# Patient Record
Sex: Male | Born: 1935 | Race: White | Hispanic: No | Marital: Married | Smoking: Never smoker
Health system: Southern US, Community
[De-identification: ages and names within clinical notes are randomized; demographics above are authoritative.]

## PROBLEM LIST (undated history)

## (undated) DIAGNOSIS — K603 Anal fistula, unspecified: Secondary | ICD-10-CM

## (undated) DIAGNOSIS — N419 Inflammatory disease of prostate, unspecified: Secondary | ICD-10-CM

## (undated) DIAGNOSIS — R011 Cardiac murmur, unspecified: Secondary | ICD-10-CM

## (undated) DIAGNOSIS — K649 Unspecified hemorrhoids: Secondary | ICD-10-CM

## (undated) DIAGNOSIS — I480 Paroxysmal atrial fibrillation: Secondary | ICD-10-CM

## (undated) DIAGNOSIS — N390 Urinary tract infection, site not specified: Secondary | ICD-10-CM

## (undated) DIAGNOSIS — D649 Anemia, unspecified: Secondary | ICD-10-CM

## (undated) DIAGNOSIS — I2510 Atherosclerotic heart disease of native coronary artery without angina pectoris: Secondary | ICD-10-CM

## (undated) DIAGNOSIS — C9000 Multiple myeloma not having achieved remission: Secondary | ICD-10-CM

## (undated) DIAGNOSIS — I350 Nonrheumatic aortic (valve) stenosis: Secondary | ICD-10-CM

## (undated) DIAGNOSIS — C61 Malignant neoplasm of prostate: Secondary | ICD-10-CM

## (undated) DIAGNOSIS — I2699 Other pulmonary embolism without acute cor pulmonale: Principal | ICD-10-CM

## (undated) HISTORY — PX: ANAL FISTULECTOMY: SHX1139

## (undated) HISTORY — PX: CYSTOSCOPY: SHX5120

## (undated) HISTORY — DX: Atherosclerotic heart disease of native coronary artery without angina pectoris: I25.10

## (undated) HISTORY — DX: Malignant neoplasm of prostate: C61

## (undated) HISTORY — PX: TONSILLECTOMY: SUR1361

## (undated) HISTORY — PX: ANKLE SURGERY: SHX546

## (undated) HISTORY — DX: Other pulmonary embolism without acute cor pulmonale: I26.99

## (undated) HISTORY — DX: Nonrheumatic aortic (valve) stenosis: I35.0

## (undated) HISTORY — PX: CARDIAC CATHETERIZATION: SHX172

## (undated) HISTORY — PX: PROSTATE BIOPSY: SHX241

---

## 2011-02-14 DIAGNOSIS — Z23 Encounter for immunization: Secondary | ICD-10-CM | POA: Diagnosis not present

## 2011-07-28 DIAGNOSIS — R972 Elevated prostate specific antigen [PSA]: Secondary | ICD-10-CM | POA: Diagnosis not present

## 2011-08-18 DIAGNOSIS — R972 Elevated prostate specific antigen [PSA]: Secondary | ICD-10-CM | POA: Diagnosis not present

## 2011-10-10 DIAGNOSIS — R972 Elevated prostate specific antigen [PSA]: Secondary | ICD-10-CM | POA: Diagnosis not present

## 2011-10-10 DIAGNOSIS — D696 Thrombocytopenia, unspecified: Secondary | ICD-10-CM | POA: Diagnosis not present

## 2011-10-10 DIAGNOSIS — E785 Hyperlipidemia, unspecified: Secondary | ICD-10-CM | POA: Diagnosis not present

## 2011-10-10 DIAGNOSIS — R030 Elevated blood-pressure reading, without diagnosis of hypertension: Secondary | ICD-10-CM | POA: Diagnosis not present

## 2011-10-15 DIAGNOSIS — D696 Thrombocytopenia, unspecified: Secondary | ICD-10-CM | POA: Diagnosis not present

## 2011-10-15 DIAGNOSIS — Z23 Encounter for immunization: Secondary | ICD-10-CM | POA: Diagnosis not present

## 2011-10-15 DIAGNOSIS — R030 Elevated blood-pressure reading, without diagnosis of hypertension: Secondary | ICD-10-CM | POA: Diagnosis not present

## 2011-11-04 DIAGNOSIS — R972 Elevated prostate specific antigen [PSA]: Secondary | ICD-10-CM | POA: Diagnosis not present

## 2011-12-01 DIAGNOSIS — L570 Actinic keratosis: Secondary | ICD-10-CM | POA: Diagnosis not present

## 2011-12-01 DIAGNOSIS — D485 Neoplasm of uncertain behavior of skin: Secondary | ICD-10-CM | POA: Diagnosis not present

## 2011-12-04 DIAGNOSIS — K5730 Diverticulosis of large intestine without perforation or abscess without bleeding: Secondary | ICD-10-CM | POA: Diagnosis not present

## 2011-12-04 DIAGNOSIS — K648 Other hemorrhoids: Secondary | ICD-10-CM | POA: Diagnosis not present

## 2011-12-04 DIAGNOSIS — Z1211 Encounter for screening for malignant neoplasm of colon: Secondary | ICD-10-CM | POA: Diagnosis not present

## 2012-02-04 DIAGNOSIS — L570 Actinic keratosis: Secondary | ICD-10-CM | POA: Diagnosis not present

## 2012-05-04 DIAGNOSIS — R030 Elevated blood-pressure reading, without diagnosis of hypertension: Secondary | ICD-10-CM | POA: Diagnosis not present

## 2012-08-04 DIAGNOSIS — L570 Actinic keratosis: Secondary | ICD-10-CM | POA: Diagnosis not present

## 2012-11-04 DIAGNOSIS — R972 Elevated prostate specific antigen [PSA]: Secondary | ICD-10-CM | POA: Diagnosis not present

## 2012-11-09 DIAGNOSIS — Z125 Encounter for screening for malignant neoplasm of prostate: Secondary | ICD-10-CM | POA: Diagnosis not present

## 2012-11-09 DIAGNOSIS — Z0000 Encounter for general adult medical examination without abnormal findings: Secondary | ICD-10-CM | POA: Diagnosis not present

## 2012-11-09 DIAGNOSIS — D696 Thrombocytopenia, unspecified: Secondary | ICD-10-CM | POA: Diagnosis not present

## 2012-11-09 DIAGNOSIS — I4949 Other premature depolarization: Secondary | ICD-10-CM | POA: Diagnosis not present

## 2012-11-09 DIAGNOSIS — R030 Elevated blood-pressure reading, without diagnosis of hypertension: Secondary | ICD-10-CM | POA: Diagnosis not present

## 2012-11-09 DIAGNOSIS — K5730 Diverticulosis of large intestine without perforation or abscess without bleeding: Secondary | ICD-10-CM | POA: Diagnosis not present

## 2012-11-09 DIAGNOSIS — E559 Vitamin D deficiency, unspecified: Secondary | ICD-10-CM | POA: Diagnosis not present

## 2012-11-09 DIAGNOSIS — R972 Elevated prostate specific antigen [PSA]: Secondary | ICD-10-CM | POA: Diagnosis not present

## 2012-11-09 DIAGNOSIS — I517 Cardiomegaly: Secondary | ICD-10-CM | POA: Diagnosis not present

## 2012-11-09 DIAGNOSIS — Z5181 Encounter for therapeutic drug level monitoring: Secondary | ICD-10-CM | POA: Diagnosis not present

## 2012-11-12 DIAGNOSIS — R030 Elevated blood-pressure reading, without diagnosis of hypertension: Secondary | ICD-10-CM | POA: Diagnosis not present

## 2012-11-12 DIAGNOSIS — R972 Elevated prostate specific antigen [PSA]: Secondary | ICD-10-CM | POA: Diagnosis not present

## 2012-11-12 DIAGNOSIS — K5730 Diverticulosis of large intestine without perforation or abscess without bleeding: Secondary | ICD-10-CM | POA: Diagnosis not present

## 2012-11-12 DIAGNOSIS — I4949 Other premature depolarization: Secondary | ICD-10-CM | POA: Diagnosis not present

## 2012-11-12 DIAGNOSIS — Z23 Encounter for immunization: Secondary | ICD-10-CM | POA: Diagnosis not present

## 2012-11-12 DIAGNOSIS — D696 Thrombocytopenia, unspecified: Secondary | ICD-10-CM | POA: Diagnosis not present

## 2012-12-06 DIAGNOSIS — R9431 Abnormal electrocardiogram [ECG] [EKG]: Secondary | ICD-10-CM | POA: Diagnosis not present

## 2012-12-06 DIAGNOSIS — R002 Palpitations: Secondary | ICD-10-CM | POA: Diagnosis not present

## 2012-12-06 DIAGNOSIS — R9430 Abnormal result of cardiovascular function study, unspecified: Secondary | ICD-10-CM | POA: Diagnosis not present

## 2013-02-23 DIAGNOSIS — L570 Actinic keratosis: Secondary | ICD-10-CM | POA: Diagnosis not present

## 2013-05-23 DIAGNOSIS — I6529 Occlusion and stenosis of unspecified carotid artery: Secondary | ICD-10-CM | POA: Diagnosis not present

## 2013-05-23 DIAGNOSIS — R972 Elevated prostate specific antigen [PSA]: Secondary | ICD-10-CM | POA: Diagnosis not present

## 2013-05-23 DIAGNOSIS — R030 Elevated blood-pressure reading, without diagnosis of hypertension: Secondary | ICD-10-CM | POA: Diagnosis not present

## 2013-06-01 DIAGNOSIS — I6529 Occlusion and stenosis of unspecified carotid artery: Secondary | ICD-10-CM | POA: Diagnosis not present

## 2013-10-05 DIAGNOSIS — D239 Other benign neoplasm of skin, unspecified: Secondary | ICD-10-CM | POA: Diagnosis not present

## 2013-10-05 DIAGNOSIS — L821 Other seborrheic keratosis: Secondary | ICD-10-CM | POA: Diagnosis not present

## 2013-11-07 DIAGNOSIS — N400 Enlarged prostate without lower urinary tract symptoms: Secondary | ICD-10-CM | POA: Diagnosis not present

## 2013-11-07 DIAGNOSIS — N410 Acute prostatitis: Secondary | ICD-10-CM | POA: Diagnosis not present

## 2013-11-07 DIAGNOSIS — R972 Elevated prostate specific antigen [PSA]: Secondary | ICD-10-CM | POA: Diagnosis not present

## 2013-12-06 DIAGNOSIS — R972 Elevated prostate specific antigen [PSA]: Secondary | ICD-10-CM | POA: Diagnosis not present

## 2013-12-07 DIAGNOSIS — N400 Enlarged prostate without lower urinary tract symptoms: Secondary | ICD-10-CM | POA: Diagnosis not present

## 2013-12-07 DIAGNOSIS — Z23 Encounter for immunization: Secondary | ICD-10-CM | POA: Diagnosis not present

## 2013-12-07 DIAGNOSIS — N390 Urinary tract infection, site not specified: Secondary | ICD-10-CM | POA: Diagnosis not present

## 2013-12-07 DIAGNOSIS — R030 Elevated blood-pressure reading, without diagnosis of hypertension: Secondary | ICD-10-CM | POA: Diagnosis not present

## 2013-12-07 DIAGNOSIS — R972 Elevated prostate specific antigen [PSA]: Secondary | ICD-10-CM | POA: Diagnosis not present

## 2013-12-07 DIAGNOSIS — R011 Cardiac murmur, unspecified: Secondary | ICD-10-CM | POA: Diagnosis not present

## 2013-12-07 DIAGNOSIS — I499 Cardiac arrhythmia, unspecified: Secondary | ICD-10-CM | POA: Diagnosis not present

## 2013-12-07 DIAGNOSIS — M25562 Pain in left knee: Secondary | ICD-10-CM | POA: Diagnosis not present

## 2014-01-12 DIAGNOSIS — D649 Anemia, unspecified: Secondary | ICD-10-CM | POA: Diagnosis not present

## 2014-01-12 DIAGNOSIS — R700 Elevated erythrocyte sedimentation rate: Secondary | ICD-10-CM | POA: Diagnosis not present

## 2014-01-12 DIAGNOSIS — R030 Elevated blood-pressure reading, without diagnosis of hypertension: Secondary | ICD-10-CM | POA: Diagnosis not present

## 2014-01-26 DIAGNOSIS — R972 Elevated prostate specific antigen [PSA]: Secondary | ICD-10-CM | POA: Diagnosis not present

## 2014-01-26 DIAGNOSIS — C61 Malignant neoplasm of prostate: Secondary | ICD-10-CM | POA: Diagnosis not present

## 2014-02-01 ENCOUNTER — Other Ambulatory Visit: Payer: Self-pay | Admitting: Urology

## 2014-02-01 DIAGNOSIS — C61 Malignant neoplasm of prostate: Secondary | ICD-10-CM

## 2014-02-16 ENCOUNTER — Encounter (HOSPITAL_COMMUNITY)
Admission: RE | Admit: 2014-02-16 | Discharge: 2014-02-16 | Disposition: A | Discharge status: Home / Self Care | Payer: Medicare Other | Source: Ambulatory Visit | Attending: Urology | Admitting: Urology

## 2014-02-16 ENCOUNTER — Encounter: Payer: Self-pay | Admitting: Diagnostic Radiology

## 2014-02-16 DIAGNOSIS — C61 Malignant neoplasm of prostate: Secondary | ICD-10-CM | POA: Diagnosis not present

## 2014-02-16 MED ORDER — TECHNETIUM TC 99M MEDRONATE IV KIT
26.6000 | PACK | Freq: Once | INTRAVENOUS | Status: AC | PRN
  Administered 2014-02-16: 26.6 via INTRAVENOUS

## 2014-02-27 DIAGNOSIS — C61 Malignant neoplasm of prostate: Secondary | ICD-10-CM | POA: Diagnosis not present

## 2014-02-28 ENCOUNTER — Encounter: Payer: Self-pay | Admitting: Medical Oncology

## 2014-02-28 NOTE — [facility] Oncology Navigator Note (Signed)
I called pt to introduce myself as the Prostate Nurse Navigator and the Coordinator of the Prostate [place].  1. I confirmed with the patient he is aware of his referral to the clinic. He states he saw Dr. Diona Fanti yesterday and he briefly discussed the [facility].  I confirmed his appointment for 2/12/ with arrival time at 7:30.   2. I discussed the format of the clinic and the physicians he will be seeing that day and to bring his wife or family members if he desired. He states he will probably bring his wife.   3. I discussed where the clinic is located and how to contact me.  4. I confirmed his address and informed him I would be mailing a packet of information and forms to be completed. I asked him to bring them with him the day of his appointment.   He voiced understanding of the above. I asked him to call me if he has any questions or concerns regarding his appointments or the forms he needs to complete.   Cira Rue, RN, BSN, [place]  [phone] Fax [phone]

## 2014-03-07 ENCOUNTER — Encounter: Payer: Self-pay | Admitting: Radiation Oncology

## 2014-03-07 NOTE — Progress Notes (Signed)
GU Location of Tumor / Histology: prostatic adenocarcinoma   If Prostate Cancer, Gleason Score is (4 + 5) and PSA is (10.32)  Mitchell Lowery presented to Dr. Diona Fanti in October 2015 as a self referral for prostate problems.  Biopsies of prostate (if applicable) revealed:    Past/Anticipated interventions by urology, if any: biopsy and referral to radiation therapy; recommending aggressive treatment   Past/Anticipated interventions by medical oncology, if any: no  Weight changes, if any: no  Bowel/Bladder complaints, if any: sudden onset of fever as well as frequency of urination, denies dysuria, reports a good urine stream, denies gross hematuria, denies significant nocturia    Nausea/Vomiting, if any: no  Pain issues, if any:  no  SAFETY ISSUES:  Prior radiation? no  Pacemaker/ICD? no  Possible current pregnancy? no  Is the patient on methotrexate? no  Current Complaints / other details:  80 year old male. Married. Retired. 41 cc prostate volume. 1 son and 3 daughters. Bone and CT scan negative. Reports excellent sexual function. Dahlstedt discussed surgery vs. adrogen therapy, radiotherapy and a boost.

## 2014-03-10 ENCOUNTER — Ambulatory Visit
Admission: RE | Admit: 2014-03-10 | Discharge: 2014-03-10 | Disposition: A | Discharge status: Home / Self Care | Payer: Medicare Other | Source: Ambulatory Visit | Attending: Radiation Oncology | Admitting: Radiation Oncology

## 2014-03-10 ENCOUNTER — Encounter: Payer: Self-pay | Admitting: *Deleted

## 2014-03-10 ENCOUNTER — Encounter: Payer: Self-pay | Admitting: Medical Oncology

## 2014-03-10 ENCOUNTER — Encounter: Payer: Self-pay | Admitting: Radiation Oncology

## 2014-03-10 ENCOUNTER — Ambulatory Visit (HOSPITAL_BASED_OUTPATIENT_CLINIC_OR_DEPARTMENT_OTHER): Payer: Medicare Other | Admitting: Oncology

## 2014-03-10 VITALS — BP 147/56 | HR 61 | Temp 97.8°F | Resp 20 | Ht 71.0 in | Wt 171.6 lb

## 2014-03-10 DIAGNOSIS — C61 Malignant neoplasm of prostate: Secondary | ICD-10-CM | POA: Diagnosis not present

## 2014-03-10 DIAGNOSIS — Z7982 Long term (current) use of aspirin: Secondary | ICD-10-CM | POA: Insufficient documentation

## 2014-03-10 HISTORY — DX: Anal fistula, unspecified: K60.30

## 2014-03-10 HISTORY — DX: Inflammatory disease of prostate, unspecified: N41.9

## 2014-03-10 HISTORY — DX: Anal fistula: K60.3

## 2014-03-10 NOTE — [facility] Oncology Navigator Note (Addendum)
                               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 Raynelle Bring, [facility]  Radiation Oncologist - Dr. Tyler Pita, [facility]   Medical Oncologist - Dr. Zola Button, [place]  Recommendations: 1) Hormone Therapy  2) Radiation 3) Seed Implant  * These recommendations are based on information available as of today's consult.      Recommendations may change depending on the results of further tests or exams.    Next Steps: 1) Dr. Reinaldo Berber office will call you to set up hormone therapy. 2) Dr. Johny Shears  office will call you with radiation appointments 3) Dr. Johny Shears office will schedule seed implants When appointments need to be scheduled, you will be contacted by [facility] and/or [facility].  Questions?  Please do not hesitate to call Cira Rue, RN, BSN, CRNI at [phone] any questions or concerns.  Shirlean Mylar is your Oncology Nurse Navigator and is available to assist you while you're receiving your medical care at [facility].

## 2014-03-10 NOTE — Progress Notes (Signed)
80 year old male. Retired. Married to [place]. Has four children, Elige Ko, and Webb Silversmith. Reports that he has had a colonoscopy. Denies performing routine testicular exams. Wears glasses.

## 2014-03-10 NOTE — Consult Note (Signed)
Chief Complaint  Prostate Cancer   Reason For Visit  Reason for consult: To discuss treatment options for high risk localized prostate cancer. Location of consult: [facility] Physician requesting consult: Dr. Franchot Gallo PCP: Dr. 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 Diona Fanti in October 2015 after moving to [facility].  He was noted to have a history of recurrent prostatitis and had a history of an elevated PSA.  He had apparently undergone a prostate biopsy 10-20 years ago in [place] that was benign per the patient's report.  His PSA was found to be 9.06 when checked by Dr. Diona Fanti and this was noted to be increased from 5.7 at his last urologist's office the prior year.  His PSA was rechecked and was 10.3.  This prompted a prostate needle biopsy on 01/26/14 that confirmed Gleason 5+4=9 adenocarcinoma with 4 out of 12 biopsy cores positive for malignancy. Staging studies were performed on 02/16/14 including a bone scan and CT scan of the abdomen and pelvis with no concerning findings to suggest metastatic disease.  He is very healthy and has no medical comorbid conditions.  He does have longevity in his family with his mother having lived to age 67.  TNM stage: cT1c N0 M0 PSA: 10.3 Gleason score: 4+5=9 Biopsy (01/26/14): 4/12 cores positive     Left: L lateral apex (90%, 4+5=9), L apex (50%, 4+5=9), L lateral mid (40%, 4+3=7), L mid (20%, 3+4=7) Prostate volume: 41.0 cc  Nomogram OC disease: 22 % EPE: 75% SVI: 17% LNI: 19%  Urinary function: IPSS is 1. Erectile function: SHIM score is 17.   Past Medical History  1. History of No significant past medical history  Surgical History  1. History of Anal Fistulectomy  2. History of Ankle Surgery  Current Meds  1. Aspirin 81 MG Oral Tablet;  Therapy: (Recorded:12Oct2015) to Recorded  2. Saw Palmetto CAPS;   Therapy: (Recorded:12Oct2015) to Recorded  Allergies  1. No Known Drug Allergies  Family History  1. Family history of Death of family member : Mother, Father  2. Family history of malaria (Z83.1) : Father  3. Family history of pneumonia (Z83.1) : Mother  Social History   Alcohol use (Z78.9)   Denied: History of Caffeine use   Married   Never a smoker   Number of children   Retired  Physical Exam Constitutional: Well nourished and well developed . No acute distress.    Results/Data  We have reviewed his medical records, pathology slides, PSA results, and bone scan and CT scan images in the multidisciplinary clinic today.  Findings are as dictated above.     Assessment  1. Adenocarcinoma of prostate (C61)  Discussion/Summary  1.  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  He understands that he would be at high risk for development of metastatic disease and death related to prostate cancer without treatment.  I therefore recommended therapy of curative intent.  He has previously discussed his options with Dr. Alen Blew and Dr. Tammi Klippel this morning.  We specifically focused our discussion on options including primary surgical therapy and primary radiotherapy with concomitant long-term androgen deprivation treatment.  We have reviewed the pros and cons of these  approaches in detail.  He understands that surgical therapy certainly carries a higher risk for incontinence at his advanced age and there certainly is a potential risk for needing radiation or androgen deprivation therapy as adjuvant/salvage treatment regardless considering his high risk disease.  We have reviewed the approach of primary radiation therapy with concomitant androgen deprivation and the potential side effects related  to these therapies and have a comparative surgical treatment.  We have specifically discussed how these treatments may affect his urinary function, erectile function, bowel function, and other aspects of his quality of life.  He has many good questions and has a very excellent understanding of his treatment options.  All of his questions were answered to his stated satisfaction.  After an extensive discussion, [name] informed me that he does wish to proceed with primary radiotherapy in conjunction with androgen deprivation.  Dr. Tammi Klippel will proceed to schedule him for radiation treatment planning.  I will notify Dr. Diona Fanti of his decision so the he can begin androgen deprivation therapy and placement of fiducial markers.  Dr. Tammi Klippel is given strong consideration of external beam radiation therapy with a radiation seed implant boost.    A total of 45 minutes were spent in the overall care of the patient today with 45 minutes in direct face to face consultation.   Cc: Dr. Mertha Finders Dr. Franchot Gallo Dr. Tyler Pita Dr. Zola Button    Signatures Electronically signed by : Raynelle Bring, M.D.; Mar 10 2014 12:21PM EST

## 2014-03-10 NOTE — Progress Notes (Signed)
Radiation Oncology         [phone] ________________________________  Multidisciplinary Prostate Cancer Clinic  Initial Radiation Oncology Consultation  Name: Mitchell Lowery MRN: [id number]  Date: 03/10/2014  DOB: 08-18-1934  CC:No primary care provider on file.  Raynelle Bring, MD   REFERRING PHYSICIAN: Raynelle Bring, MD  DIAGNOSIS: 80 y.o. gentleman with stage T1c adenocarcinoma of the prostate with a Gleason's score of 4+5 and a PSA of 10.4    ICD-9-CM ICD-10-CM   1. Malignant neoplasm of prostate [place] is a 80 y.o. gentleman with history of prostatitis in the past.  He moved to [facility] and transferred his care to Dr. Diona Fanti on 11/07/13.  He was noted to have an elevated PSA at that time at 9.06.  Digital rectal examination was performed at that time revealing a 60 gm gland.  In follow-up, his PSA was higher at 10.32 on 12/06/13.  The patient proceeded to transrectal ultrasound with 12 biopsies of the prostate on 01/26/14.  The prostate volume measured 41.03 cc.  Out of 12 core biopsies, 4 were positive.  The maximum Gleason score was 4+5, and this was seen in the left apex.    The patient reviewed the biopsy results with his urologist and he has kindly been referred today to the multidisciplinary prostate cancer clinic for presentation of pathology and radiology studies in our conference for discussion of potential radiation treatment options and clinical evaluation.  PREVIOUS RADIATION THERAPY: No  PAST MEDICAL HISTORY:  has a past medical history of Prostate cancer.    PAST SURGICAL HISTORY: Past Surgical History  Procedure Laterality Date  . Prostate biopsy    . Prostate biopsy    . Anal fistulectomy    . Ankle surgery      FAMILY HISTORY: family history is not on file.  SOCIAL HISTORY:  reports that he has never smoked. He has never used smokeless tobacco. He reports that he drinks alcohol. He reports  that he does not use illicit drugs.  ALLERGIES: Review of patient's allergies indicates no known allergies.  MEDICATIONS:  Current Outpatient Prescriptions  Medication Sig Dispense Refill  . aspirin 81 MG tablet Take 81 mg by mouth daily.    . saw palmetto 160 MG capsule Take 160 mg by mouth 2 (two) times daily.    Marland Kitchen trimethoprim (TRIMPEX) 100 MG tablet Take 100 mg by mouth 2 (two) times daily.     No current facility-administered medications for this encounter.    REVIEW OF SYSTEMS:  A 15 point review of systems is documented in the electronic medical record. This was obtained by the nursing staff. However, I reviewed this with the patient to discuss relevant findings and make appropriate changes.  A comprehensive review of systems was negative..  The patient completed an IPSS and IIEF questionnaire.  His IPSS score was 1 indicating mild urinary outflow obstructive symptoms.  He indicated that his erectile function is able to complete sexual activity on most attempts.   PHYSICAL EXAM: This patient is in no acute distress.  He is alert and oriented.   He exhibits no respiratory distress or labored breathing.  He appears neurologically intact.  His mood is pleasant.  His affect is appropriate.  Please note the digital rectal exam findings described above.  KPS = 100  100 - Normal; no complaints; no evidence of disease. 90   - Able to carry on normal activity; minor signs  or symptoms of disease. 80   - Normal activity with effort; some signs or symptoms of disease. 68   - Cares for self; unable to carry on normal activity or to do active work. 60   - Requires occasional assistance, but is able to care for most of his personal needs. 50   - Requires considerable assistance and frequent medical care. 63   - Disabled; requires special care and assistance. 8   - Severely disabled; hospital admission is indicated although death not imminent. 56   - Very sick; hospital admission necessary;  active supportive treatment necessary. 10   - Moribund; fatal processes progressing rapidly. 0     - Dead  Karnofsky DA, Abelmann WH, Craver LS and Burchenal JH [phone]) The use of the nitrogen mustards in the palliative treatment of carcinoma: with particular reference to bronchogenic carcinoma Cancer 1 634-56   LABORATORY DATA:  No results found for: WBC, HGB, HCT, MCV, PLT No results found for: NA, K, CL, CO2 No results found for: ALT, AST, GGT, ALKPHOS, BILITOT   RADIOGRAPHY: Nm Bone Scan Whole Body  02/16/2014   CLINICAL DATA:  Prostate cancer.  PSA 10.32  EXAM: NUCLEAR MEDICINE WHOLE BODY BONE SCAN  TECHNIQUE: Whole body anterior and posterior images were obtained approximately 3 hours after intravenous injection of radiopharmaceutical.  RADIOPHARMACEUTICALS:  26.6 MCi Technetium-99 MDP  COMPARISON:  CT abdomen and pelvis 02/16/2014.  FINDINGS: No abnormal uptake to suggest metastatic disease is identified. No activity can suggestive of degenerative disease is identified. Soft tissue uptake is normal.  IMPRESSION: Negative for metastatic disease.   Electronically Signed   By: Inge Rise M.D.   On: 02/16/2014 14:41      IMPRESSION: This gentleman is a 80 y.o. gentleman with stage T1c adenocarcinoma of the prostate with a Gleason's score of 4+5 and a PSA of 10.4.  His T-Stage, Gleason's Score, and PSA put him into the high risk group.  Accordingly he is eligible for a variety of potential treatment options including radical prostatectomy versus external beam radiotherapy with or without seed implant boost in conjunction with 2-3 years of androgen deprivation therapy.  PLAN:Today I reviewed the findings and workup thus far.  We discussed the natural history of prostate cancer.  We reviewed the the implications of T-stage, Gleason's Score, and PSA on decision-making and outcomes in prostate cancer.  We discussed radiation treatment in the management of prostate cancer with regard to the  logistics and delivery of external beam radiation treatment as well as the logistics and delivery of prostate brachytherapy.  We compared and contrasted each of these approaches and also compared these against prostatectomy.  The patient expressed interest in external beam radiotherapy followed by prostate seed implant in conjunction with 2-3 years of androgen deprivation. Given the risk of pelvic lymph node involvement, I would recommend treatment of the pelvic lymph nodes during the initial external radiotherapy..  I filled out a patient counseling form for him with relevant treatment diagrams and we retained a copy for our records.   The patient would like to proceed with prostate, seminal vesicle, and pelvic lymph node IMRT followed by prostate seed implant..  I will share my findings with Dr. Alinda Money and move forward with scheduling a return visit to Dr. Diona Fanti for initiation of androgen deprivation therapy now, and in 6 weeks, placement of three gold fiducial markers into the prostate to proceed with IMRT to be followed by prostate seed implant.     I  enjoyed meeting with him today, and will look forward to participating in the care of this very nice gentleman.   I spent 60 minutes face to face with the patient and more than 50% of that time was spent in counseling and/or coordination of care.   ------------------------------------------------  Sheral Apley. Tammi Klippel, M.D.

## 2014-03-10 NOTE — Consult Note (Signed)
Reason for Referral: Prostate cancer.   HPI: 80 year old gentleman who have been rather healthy the majority of his life. He has lived in multiple locations and was a Event organiser at [employer] and while he was living there he had a biopsy of his prostate that was done revealing. He has had intermittent symptoms of acute prostatitis also had had an elevated PSA previously. He established care with Dr. Diona Fanti after he relocated to [facility] for the last 6 months. His repeat PSA was up to 10.32 and underwent a biopsy on 01/31/2014. The biopsy showed a Gleason score 4+5 = 9 and 2 cores at the apex and 3+4 = 7 in 2 other cores. He is essentially asymptomatic at this time with IPSS score of 0 and no other symptoms. He is a very high functional individual and continues to be active on a regular basis. He does not report any headaches, blurry vision, double vision or syncope. Does not report any chest pain, palpitation or orthopnea. Does not report any cough or hemoptysis or hematemesis. He does not report any nausea, vomiting, abdominal pain. Does not report any frequency urgency or hesitancy. Does not report skeletal complaints. Rest of his review of systems unremarkable.   Past Medical History  Diagnosis Date  . Prostate cancer   :  Past Surgical History  Procedure Laterality Date  . Prostate biopsy    . Prostate biopsy    . Anal fistulectomy    . Ankle surgery    :   Current outpatient prescriptions:  .  aspirin 81 MG tablet, Take 81 mg by mouth daily., Disp: , Rfl:  .  saw palmetto 160 MG capsule, Take 160 mg by mouth 2 (two) times daily., Disp: , Rfl:  .  trimethoprim (TRIMPEX) 100 MG tablet, Take 100 mg by mouth 2 (two) times daily., Disp: , Rfl: :  No Known Allergies:  No family history on file.:  History   Social History  . Marital Status: Unknown    Spouse Name: N/A  . Number of Children: N/A  . Years of Education: N/A   Occupational History  . Not on  file.   Social History Main Topics  . Smoking status: Never Smoker   . Smokeless tobacco: Never Used  . Alcohol Use: Yes  . Drug Use: No  . Sexual Activity: Yes   Other Topics Concern  . Not on file   Social History Narrative  :  Pertinent items are noted in HPI.  Exam: ECOG 0  There were no vitals taken for this visit. General appearance: alert and cooperative Head: Normocephalic, without obvious abnormality Throat: lips, mucosa, and tongue normal; teeth and gums normal Neck: no adenopathy Back: negative Resp: clear to auscultation bilaterally Chest wall: no tenderness Cardio: regular rate and rhythm, S1, S2 normal, no murmur, click, rub or gallop GI: soft, non-tender; bowel sounds normal; no masses,  no organomegaly Extremities: extremities normal, atraumatic, no cyanosis or edema Pulses: 2+ and symmetric Skin: Skin color, texture, turgor normal. No rashes or lesions  Nm Bone Scan Whole Body  02/16/2014   CLINICAL DATA:  Prostate cancer.  PSA 10.32  EXAM: NUCLEAR MEDICINE WHOLE BODY BONE SCAN  TECHNIQUE: Whole body anterior and posterior images were obtained approximately 3 hours after intravenous injection of radiopharmaceutical.  RADIOPHARMACEUTICALS:  26.6 MCi Technetium-99 MDP  COMPARISON:  CT abdomen and pelvis 02/16/2014.  FINDINGS: No abnormal uptake to suggest metastatic disease is identified. No activity can suggestive of degenerative disease is  identified. Soft tissue uptake is normal.  IMPRESSION: Negative for metastatic disease.   Electronically Signed   By: Inge Rise M.D.   On: 02/16/2014 14:41    Assessment and Plan:   80 year old gentleman diagnosed with prostate cancer after presenting with a PSA of 10.32 and a Gleason score 4+5 = 9 in 2 cores and a 3+4 = 7 in 2 other cores. He did have a bone scan on 02/16/2014 that was reviewed today and showed no evidence of metastatic disease. CT scan of the abdomen and pelvis no evidence of any metastasis at that  time. His case was discussed today in the prostate cancer multidisciplinary clinic. His pathology was discussed with the reviewing pathologist. Options of treatments were discussed today which include radical prostatectomy versus radiation therapy with androgen deprivation. Despite his excellent 80 age and performance status we feel that radiation therapy with hormone therapy remains the standard of care in his situation.  I discussed with him the rationale of using androgen deprivation therapy as well as to duration extensively today. Side effects of androgen deprivation was discussed today extensively including muscle loss, weight gain, hyperglycemia, hyperlipidemia, fatigue, tiredness, osteoporosis, cardiovascular, complications. I have discussed with him multitude of trials that compared to duration of androgen deprivation including adjuvant Lupron for 18 months for patients with locally advanced disease that showed the majority of the side effects resolve in time.  Clinical trials including the RTOG 92-02, EORTC 58850, and others that compared different durations showed that 4 months is probably too short of the duration and 36 months is probably unnecessary. A period of 12-18 months might be reasonable for the duration of androgen deprivation in his particular case.  All his questions were answered to his satisfaction and he'll like to hear from Dr. Alinda Money before making his decision regarding ruling out surgery.

## 2014-03-10 NOTE — Progress Notes (Signed)
  [place] Work  Clinical Social Work met with pt and spouse at [facility] to review distress screening protocol, explain role of CSW and resources of Pt and Family Support Team.  The patient scored a 1 on the Psychosocial Distress Thermometer which indicates no distress. Pt reports to have some anxiety about his treatment plan, but overall feels no distress at this time. Clinical Social Worker reviewed how this could change at various points throughout his treatment. CSW encouraged pt and wife to attend Prostate Cancer Support group and explained other options for support. Pt and wife agree to reach out to CSW as needed.   Clinical Social Worker follow up needed: No.  If yes, follow up plan:  Loren Racer, [place]  [facility] Phone: [phone] Fax: [phone]

## 2014-03-10 NOTE — Progress Notes (Signed)
Please see consult note.  

## 2014-03-14 ENCOUNTER — Telehealth: Payer: Self-pay | Admitting: *Deleted

## 2014-03-14 NOTE — Telephone Encounter (Signed)
CALLED PATIENT TO INFORM THAT HE CAN GO FOR HIS HORMONE SHOT, TODAY, TOMORROW OF Thursday 3 PM OR 3:30 PM AND HIS GOLD SEED PLACEMENT ON 05-02-14 - ARRIVAL TIME @ THE UROLOGIST'S OFFICE AND HIS SIM ON 05-05-14 @ 1 PM @ DR. MANNING'S OFFICE, LVM FOR A RETURN CALL

## 2014-03-16 ENCOUNTER — Telehealth: Payer: Self-pay | Admitting: *Deleted

## 2014-03-16 DIAGNOSIS — C61 Malignant neoplasm of prostate: Secondary | ICD-10-CM | POA: Diagnosis not present

## 2014-03-16 DIAGNOSIS — Z5111 Encounter for antineoplastic chemotherapy: Secondary | ICD-10-CM | POA: Diagnosis not present

## 2014-03-16 NOTE — Telephone Encounter (Signed)
Called patient to inform of sim appt. Being moved from 05-05-14 to 05-12-14 @ 1 pm, (this appt. Was moved due to seed appt. Being moved to 05-05-14), spoke with patient and he is aware of this appt. Change and he is good with this appt. Change.

## 2014-03-31 DIAGNOSIS — H2513 Age-related nuclear cataract, bilateral: Secondary | ICD-10-CM | POA: Diagnosis not present

## 2014-04-19 DIAGNOSIS — C61 Malignant neoplasm of prostate: Secondary | ICD-10-CM | POA: Diagnosis not present

## 2014-04-24 DIAGNOSIS — D696 Thrombocytopenia, unspecified: Secondary | ICD-10-CM | POA: Diagnosis not present

## 2014-04-24 DIAGNOSIS — R011 Cardiac murmur, unspecified: Secondary | ICD-10-CM | POA: Diagnosis not present

## 2014-04-24 DIAGNOSIS — R700 Elevated erythrocyte sedimentation rate: Secondary | ICD-10-CM | POA: Diagnosis not present

## 2014-04-24 DIAGNOSIS — N400 Enlarged prostate without lower urinary tract symptoms: Secondary | ICD-10-CM | POA: Diagnosis not present

## 2014-04-24 DIAGNOSIS — D649 Anemia, unspecified: Secondary | ICD-10-CM | POA: Diagnosis not present

## 2014-04-24 DIAGNOSIS — Z0001 Encounter for general adult medical examination with abnormal findings: Secondary | ICD-10-CM | POA: Diagnosis not present

## 2014-04-24 DIAGNOSIS — Z1389 Encounter for screening for other disorder: Secondary | ICD-10-CM | POA: Diagnosis not present

## 2014-04-24 DIAGNOSIS — K5790 Diverticulosis of intestine, part unspecified, without perforation or abscess without bleeding: Secondary | ICD-10-CM | POA: Diagnosis not present

## 2014-04-24 DIAGNOSIS — E780 Pure hypercholesterolemia: Secondary | ICD-10-CM | POA: Diagnosis not present

## 2014-04-24 DIAGNOSIS — R030 Elevated blood-pressure reading, without diagnosis of hypertension: Secondary | ICD-10-CM | POA: Diagnosis not present

## 2014-04-24 DIAGNOSIS — C61 Malignant neoplasm of prostate: Secondary | ICD-10-CM | POA: Diagnosis not present

## 2014-04-25 DIAGNOSIS — L570 Actinic keratosis: Secondary | ICD-10-CM | POA: Diagnosis not present

## 2014-05-02 ENCOUNTER — Telehealth: Payer: Self-pay | Admitting: Medical Oncology

## 2014-05-02 NOTE — Telephone Encounter (Signed)
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 He is scheduled for simulation with Dr. Tammi Klippel 05/12/2014. I returned his call and had to leave a message. I explained that the day of simulation he will be provided a calender of appointments for his radiation.They will discuss with him the best time of day to come before making his schedule.I It usually begins 7-10 post simulation. I asked him to call me if he needs to discuss further.    Cira Rue, RN, BSN, [place]  [phone] Fax [phone]

## 2014-05-05 ENCOUNTER — Other Ambulatory Visit: Payer: Self-pay | Admitting: Radiation Oncology

## 2014-05-05 DIAGNOSIS — C61 Malignant neoplasm of prostate: Secondary | ICD-10-CM | POA: Diagnosis not present

## 2014-05-12 ENCOUNTER — Ambulatory Visit
Admission: RE | Admit: 2014-05-12 | Discharge: 2014-05-12 | Disposition: A | Discharge status: Home / Self Care | Payer: Medicare Other | Source: Ambulatory Visit | Attending: Radiation Oncology | Admitting: Radiation Oncology

## 2014-05-12 DIAGNOSIS — C61 Malignant neoplasm of prostate: Secondary | ICD-10-CM | POA: Diagnosis not present

## 2014-05-12 NOTE — Progress Notes (Signed)
  Radiation Oncology         [phone] ________________________________  Name: WINNIE BARSKY MRN: [id number]  Date: 05/12/2014  DOB: [birth year]  SIMULATION AND TREATMENT PLANNING NOTE    ICD-9-CM ICD-10-CM   1. Malignant neoplasm of prostate 185 C61     DIAGNOSIS:  80 y.o. gentleman with stage T1c adenocarcinoma of the prostate with a Gleason's score of 4+5 and a PSA of 10.4  NARRATIVE:  The patient was brought to the [facility].  Identity was confirmed.  All relevant records and images related to the planned course of therapy were reviewed.  The patient freely provided informed written consent to proceed with treatment after reviewing the details related to the planned course of therapy. The consent form was witnessed and verified by the simulation staff.  Then, the patient was set-up in a stable reproducible supine position for radiation therapy.  A vacuum lock pillow device was custom fabricated to position his legs in a reproducible immobilized position.  Then, I performed a urethrogram under sterile conditions to identify the prostatic apex.  CT images were obtained.  Surface markings were placed.  The CT images were loaded into the planning software.  Then the prostate target and avoidance structures including the rectum, bladder, bowel and hips were contoured.  Treatment planning then occurred.  The radiation prescription was entered and confirmed.  A total of 5 complex treatment devices were fabricated, with 1 body fix custom pillow for positioning and for MLC to shape radiation. I have requested : 3D Simulation  I have requested a DVH of the following structures: rectum, bladder, left hip, right hip, and prostate.  PLAN:  The patient will receive 45 Gy in 25 fractions. Followed by a prostate seed implant for boost.  This document serves as a record of services personally performed by Tyler Pita, MD. It was created on his behalf by Darcus Austin, a trained medical  scribe. The creation of this record is based on the scribe's personal observations and the provider's statements to them. This document has been checked and approved by the attending provider.       ________________________________  Sheral Apley. Tammi Klippel, M.D.

## 2014-05-15 ENCOUNTER — Encounter: Payer: Self-pay | Admitting: Radiation Oncology

## 2014-05-15 DIAGNOSIS — C61 Malignant neoplasm of prostate: Secondary | ICD-10-CM | POA: Diagnosis not present

## 2014-05-15 NOTE — Progress Notes (Signed)
  Radiation Oncology         [phone] ________________________________  Name: Mitchell Lowery MRN: [id number]  Date: 05/15/2014  DOB: Jan 17, 1935  SIMULATION AND TREATMENT PLANNING NOTE PUBIC ARCH STUDY  CC:No primary care provider on file.  No ref. provider found  DIAGNOSIS: 80 y.o. gentleman with stage T1c adenocarcinoma of the prostate with a Gleason's score of 4+5 and a PSA of 10.4   COMPLEX SIMULATION:  The patient underwent evaluation today for possible prostate seed implant. His external irradiation 3-dimensional image study set was obtained in the planning computer. There, on each axial slice, I contoured the prostate gland. Then, using three-dimensional radiation planning tools I reconstructed the prostate in view of the structures from the transperineal needle pathway to assess for possible pubic arch interference. In doing so, I did not appreciate any pubic arch interference. Also, the patient's prostate volume was estimated based on the drawn structure. The volume was 33 cc correlating well with pre-hormone [place] volume of 41 cc.  Given the pubic arch appearance and prostate volume, patient remains a good candidate to proceed with prostate seed implant as a boost after external irradiation.    PLAN: The patient will undergo prostate seed implant boost after IMRT to his prostate and pelvic nodes.   ________________________________  Sheral Apley Tammi Klippel, M.D.

## 2014-05-16 DIAGNOSIS — C61 Malignant neoplasm of prostate: Secondary | ICD-10-CM | POA: Diagnosis not present

## 2014-05-17 ENCOUNTER — Ambulatory Visit
Admission: RE | Admit: 2014-05-17 | Discharge: 2014-05-17 | Disposition: A | Discharge status: Home / Self Care | Payer: Medicare Other | Source: Ambulatory Visit | Attending: Radiation Oncology | Admitting: Radiation Oncology

## 2014-05-17 DIAGNOSIS — C61 Malignant neoplasm of prostate: Secondary | ICD-10-CM | POA: Diagnosis not present

## 2014-05-18 ENCOUNTER — Ambulatory Visit: Admission: RE | Admit: 2014-05-18 | Payer: Medicare Other | Source: Ambulatory Visit

## 2014-05-19 ENCOUNTER — Ambulatory Visit
Admission: RE | Admit: 2014-05-19 | Discharge: 2014-05-19 | Disposition: A | Discharge status: Home / Self Care | Payer: Medicare Other | Source: Ambulatory Visit | Attending: Radiation Oncology | Admitting: Radiation Oncology

## 2014-05-19 VITALS — BP 139/61 | HR 59 | Temp 98.1°F | Wt 172.0 lb

## 2014-05-19 DIAGNOSIS — C61 Malignant neoplasm of prostate: Secondary | ICD-10-CM

## 2014-05-19 NOTE — Progress Notes (Signed)
Weekly assessment of radiation to pelvis for prostate cncer.Completed 2 treatments thus far.Denies pain or any other problems.Reviewed general side effects of treatment.Inforced need to have full bladder for treatment.Given Radiation Therapy and You Booklet to review.Aldona Bar will go into further detailed teaching next week.

## 2014-05-19 NOTE — Progress Notes (Signed)
  Radiation Oncology         [phone] ________________________________  Name: Mitchell Lowery MRN: [id number]  Date: 05/19/2014  DOB: 1934/06/20    Weekly Radiation Therapy Management    ICD-9-CM ICD-10-CM   1. Malignant neoplasm of prostate 185 C61     Current Dose: 3.6 Gy     Planned Dose:  45 Gy + seed implant  Narrative . . . . . . . . The patient presents for routine under treatment assessment.                                 Weekly assessment of radiation to pelvis for prostate cancer.Completed 2 treatments thus far.Denies pain or any other problems.Reviewed general side effects of treatment.  Reenforced need to have full bladder for treatment.Given Radiation Therapy and You Booklet to review.Aldona Bar will go into further detailed teaching next week.                                 Set-up films were reviewed.                                 The chart was checked. Physical Findings. . .  weight is 172 lb (78.019 kg). His temperature is 98.1 F (36.7 C). His blood pressure is 139/61 and his pulse is 59. . Weight essentially stable.  No significant changes. Impression . . . . . . . The patient is tolerating radiation.  Plan . . . . . . . . . . . . Continue treatment as planned.   This document serves as a record of services personally performed by Tyler Pita, MD. It was created on his behalf by Arlyce Harman, a trained medical scribe. The creation of this record is based on the scribe's personal observations and the provider's statements to them. This document has been checked and approved by the attending provider.     ________________________________  Sheral Apley. Tammi Klippel, M.D.

## 2014-05-22 ENCOUNTER — Ambulatory Visit: Payer: Medicare Other | Admitting: Radiation Oncology

## 2014-05-22 ENCOUNTER — Encounter: Payer: Self-pay | Admitting: Medical Oncology

## 2014-05-22 ENCOUNTER — Ambulatory Visit
Admission: RE | Admit: 2014-05-22 | Discharge: 2014-05-22 | Disposition: A | Discharge status: Home / Self Care | Payer: Medicare Other | Source: Ambulatory Visit | Attending: Radiation Oncology | Admitting: Radiation Oncology

## 2014-05-22 DIAGNOSIS — D649 Anemia, unspecified: Secondary | ICD-10-CM | POA: Diagnosis not present

## 2014-05-22 DIAGNOSIS — C61 Malignant neoplasm of prostate: Secondary | ICD-10-CM | POA: Diagnosis not present

## 2014-05-22 DIAGNOSIS — D72819 Decreased white blood cell count, unspecified: Secondary | ICD-10-CM | POA: Diagnosis not present

## 2014-05-22 NOTE — [facility] Oncology Navigator Note (Signed)
I spoke with Mr. Stroschein this am before his radiation treatment. He states his treatments are going well and he was without complaints. I asked him to call me if any questions or concerns. He voiced understanding.    Cira Rue, RN, BSN, [place]  [phone] Fax [phone]

## 2014-05-23 ENCOUNTER — Ambulatory Visit
Admission: RE | Admit: 2014-05-23 | Discharge: 2014-05-23 | Disposition: A | Discharge status: Home / Self Care | Payer: Medicare Other | Source: Ambulatory Visit | Attending: Radiation Oncology | Admitting: Radiation Oncology

## 2014-05-23 ENCOUNTER — Ambulatory Visit: Payer: Medicare Other

## 2014-05-23 DIAGNOSIS — C61 Malignant neoplasm of prostate: Secondary | ICD-10-CM | POA: Diagnosis not present

## 2014-05-24 ENCOUNTER — Other Ambulatory Visit: Payer: Self-pay | Admitting: Urology

## 2014-05-24 ENCOUNTER — Ambulatory Visit
Admission: RE | Admit: 2014-05-24 | Discharge: 2014-05-24 | Disposition: A | Discharge status: Home / Self Care | Payer: Medicare Other | Source: Ambulatory Visit | Attending: Radiation Oncology | Admitting: Radiation Oncology

## 2014-05-24 ENCOUNTER — Ambulatory Visit: Payer: Medicare Other

## 2014-05-24 DIAGNOSIS — C61 Malignant neoplasm of prostate: Secondary | ICD-10-CM | POA: Diagnosis not present

## 2014-05-25 ENCOUNTER — Ambulatory Visit: Payer: Medicare Other

## 2014-05-25 ENCOUNTER — Ambulatory Visit
Admission: RE | Admit: 2014-05-25 | Discharge: 2014-05-25 | Disposition: A | Discharge status: Home / Self Care | Payer: Medicare Other | Source: Ambulatory Visit | Attending: Radiation Oncology | Admitting: Radiation Oncology

## 2014-05-25 DIAGNOSIS — C61 Malignant neoplasm of prostate: Secondary | ICD-10-CM | POA: Diagnosis not present

## 2014-05-26 ENCOUNTER — Ambulatory Visit: Payer: Medicare Other

## 2014-05-26 ENCOUNTER — Ambulatory Visit
Admission: RE | Admit: 2014-05-26 | Discharge: 2014-05-26 | Disposition: A | Discharge status: Home / Self Care | Payer: Medicare Other | Source: Ambulatory Visit | Attending: Radiation Oncology | Admitting: Radiation Oncology

## 2014-05-26 ENCOUNTER — Encounter: Payer: Self-pay | Admitting: Radiation Oncology

## 2014-05-26 VITALS — BP 135/59 | HR 60 | Resp 16 | Wt 170.4 lb

## 2014-05-26 DIAGNOSIS — C61 Malignant neoplasm of prostate: Secondary | ICD-10-CM

## 2014-05-26 NOTE — Progress Notes (Signed)
  Radiation Oncology         [phone] ________________________________  Name: Mitchell Lowery MRN: [id number]  Date: 05/26/2014  DOB: [birth year]    Weekly Radiation Therapy Management    ICD-9-CM ICD-10-CM   1. Malignant neoplasm of prostate 185 C61     Current Dose: 12.6 Gy     Planned Dose:  45 Gy + seed implant  Narrative . . . . . . . . The patient presents for routine under treatment assessment.                                 Weekly assessment of radiation to pelvis for prostate cancer. Completed 7 treatments thus far. Pt reports that his bowel movements are looser by a margin (not diarrhea), but is not uncomfortable. Also reports that his urine output is normal during the day, but has difficulty urinating at night. Due to the pt having trips in the future, the dates would conflict with his seed implant.                                 Set-up films were reviewed.                                 The chart was checked.  Physical Findings. . .  weight is 170 lb 6.4 oz (77.293 kg). His blood pressure is 135/59 and his pulse is 60. His respiration is 16. . Weight essentially stable.  No significant changes. Impression . . . . . . . The patient is tolerating radiation. Advised the pt to drink more water to keep the bladder full. Plan . . . . . . . . . . . . Continue treatment as planned. Due to the pt's schedule, a good time to schedule the seed implant would be mid-late June.  This document serves as a record of services personally performed by Tyler Pita, MD. It was created on his behalf by Darcus Austin, a trained medical scribe. The creation of this record is based on the scribe's personal observations and the provider's statements to them. This document has been checked and approved by the attending provider.     ________________________________  Sheral Apley. Tammi Klippel, M.D.

## 2014-05-26 NOTE — Progress Notes (Addendum)
Weight and vitals stable. Denies pain. Reports nocturia x 1. Denies dysuria or hematuria. Reports a strong steady urine stream during the day but, does report at night his stream is much weaker. Denies urgency. Denies incontinence or leakage. Denies fatigue. Denies diarrhea.

## 2014-05-29 ENCOUNTER — Ambulatory Visit
Admission: RE | Admit: 2014-05-29 | Discharge: 2014-05-29 | Disposition: A | Discharge status: Home / Self Care | Payer: Medicare Other | Source: Ambulatory Visit | Attending: Radiation Oncology | Admitting: Radiation Oncology

## 2014-05-29 ENCOUNTER — Ambulatory Visit: Payer: Medicare Other

## 2014-05-29 DIAGNOSIS — C61 Malignant neoplasm of prostate: Secondary | ICD-10-CM | POA: Diagnosis not present

## 2014-05-30 ENCOUNTER — Encounter: Payer: Self-pay | Admitting: Medical Oncology

## 2014-05-30 ENCOUNTER — Ambulatory Visit: Payer: Medicare Other

## 2014-05-30 ENCOUNTER — Ambulatory Visit
Admission: RE | Admit: 2014-05-30 | Discharge: 2014-05-30 | Disposition: A | Discharge status: Home / Self Care | Payer: Medicare Other | Source: Ambulatory Visit | Attending: Radiation Oncology | Admitting: Radiation Oncology

## 2014-05-30 DIAGNOSIS — C61 Malignant neoplasm of prostate: Secondary | ICD-10-CM | POA: Diagnosis not present

## 2014-05-30 DIAGNOSIS — Z029 Encounter for administrative examinations, unspecified: Secondary | ICD-10-CM | POA: Diagnosis not present

## 2014-05-30 NOTE — [facility] Oncology Navigator Note (Signed)
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 He states he has been doing well but had a low grade fever yesterday. He is not sure why he had a fever. Today he is feeling ok and reports no symptoms. I asked him to call Dr. Johny Shears nurse if his fever returns or he can call me. He voiced understanding.    Cira Rue, RN, BSN, CRNI Prostate Oncology Navigator [facility]  [phone]  Fax [phone]    .

## 2014-05-31 ENCOUNTER — Ambulatory Visit: Payer: Medicare Other

## 2014-05-31 ENCOUNTER — Ambulatory Visit
Admission: RE | Admit: 2014-05-31 | Discharge: 2014-05-31 | Disposition: A | Discharge status: Home / Self Care | Payer: Medicare Other | Source: Ambulatory Visit | Attending: Radiation Oncology | Admitting: Radiation Oncology

## 2014-05-31 DIAGNOSIS — C61 Malignant neoplasm of prostate: Secondary | ICD-10-CM | POA: Diagnosis not present

## 2014-06-01 ENCOUNTER — Ambulatory Visit: Payer: Medicare Other

## 2014-06-01 ENCOUNTER — Ambulatory Visit
Admission: RE | Admit: 2014-06-01 | Discharge: 2014-06-01 | Disposition: A | Discharge status: Home / Self Care | Payer: Medicare Other | Source: Ambulatory Visit | Attending: Radiation Oncology | Admitting: Radiation Oncology

## 2014-06-01 DIAGNOSIS — C61 Malignant neoplasm of prostate: Secondary | ICD-10-CM | POA: Diagnosis not present

## 2014-06-02 ENCOUNTER — Ambulatory Visit
Admission: RE | Admit: 2014-06-02 | Discharge: 2014-06-02 | Disposition: A | Discharge status: Home / Self Care | Payer: Medicare Other | Source: Ambulatory Visit | Attending: Radiation Oncology | Admitting: Radiation Oncology

## 2014-06-02 ENCOUNTER — Encounter: Payer: Self-pay | Admitting: Radiation Oncology

## 2014-06-02 ENCOUNTER — Ambulatory Visit: Payer: Medicare Other

## 2014-06-02 VITALS — BP 143/61 | HR 61 | Resp 18 | Wt 168.4 lb

## 2014-06-02 DIAGNOSIS — C61 Malignant neoplasm of prostate: Secondary | ICD-10-CM | POA: Diagnosis not present

## 2014-06-02 MED ORDER — TAMSULOSIN HCL 0.4 MG PO CAPS: 0.4000 mg | ORAL_CAPSULE | Freq: Every day | ORAL | Status: DC

## 2014-06-02 NOTE — Progress Notes (Signed)
  Radiation Oncology         [phone] ________________________________  Name: Mitchell Lowery MRN: [id number]  Date: 06/02/2014  DOB: 10/25/1934  Weekly Radiation Therapy Management    ICD-9-CM ICD-10-CM   1. Malignant neoplasm of prostate 185 C61 ciprofloxacin (CIPRO) 500 MG tablet     tamsulosin (FLOMAX) 0.4 MG CAPS capsule    Current Dose: 23.4 Gy     Planned Dose:  45 Gy .seed implant boost  Narrative . . . . . . . . The patient presents for routine under treatment assessment. Weight and vitals stable. Denies pain. Reports nocturia x6. Denies dysuria or hematuria. Denies incontinence or leakage. Reports difficulty emptying his bladder. Reports Monday morning he awoke with a temperature of 102. Reports he began taking Cipro 500 mg bid once given to him by his urologist for prostatitis. Reports since beginning Cipro he has been afebrile.  Set-up films were reviewed. The chart was checked.  Physical Findings. . .  weight is 168 lb 6.4 oz (76.386 kg). His blood pressure is 143/61 and his pulse is 61. His respiration is 18 and oxygen saturation is 100%. . Weight essentially stable.  No significant changes.  Impression . . . . . . . The patient is tolerating radiation.  Plan . . . . . . . . . . . . Continue treatment as planned. Ordered Flomax, instructed to sit on the side of the bed before getting up if feeling lightheaded. Plan of Metamucil.  This document serves as a record of services personally performed by Tyler Pita, MD. It was created on his behalf by Jeralene Peters, a trained medical scribe. The creation of this record is based on the scribe's personal observations and the provider's statements to them. This document has been checked and approved by the attending provider.     ________________________________  Sheral Apley. Tammi Klippel, M.D.

## 2014-06-02 NOTE — Progress Notes (Signed)
Weight and vitals stable. Denies pain. Reports nocturia x6. Denies dysuria or hematuria. Denies incontinence or leakage. Reports difficulty emptying his bladder. Reports Monday morning he awoke with a temperature of 102. Reports he began taking Cipro 500 mg bid once given to him by his urologist for prostatitis. Reports since beginning Cipro he has been afebrile.

## 2014-06-05 ENCOUNTER — Ambulatory Visit
Admission: RE | Admit: 2014-06-05 | Discharge: 2014-06-05 | Disposition: A | Discharge status: Home / Self Care | Payer: Medicare Other | Source: Ambulatory Visit | Attending: Radiation Oncology | Admitting: Radiation Oncology

## 2014-06-05 ENCOUNTER — Ambulatory Visit: Payer: Medicare Other

## 2014-06-05 DIAGNOSIS — C61 Malignant neoplasm of prostate: Secondary | ICD-10-CM | POA: Diagnosis not present

## 2014-06-06 ENCOUNTER — Ambulatory Visit: Payer: Medicare Other

## 2014-06-06 ENCOUNTER — Ambulatory Visit
Admission: RE | Admit: 2014-06-06 | Discharge: 2014-06-06 | Disposition: A | Discharge status: Home / Self Care | Payer: Medicare Other | Source: Ambulatory Visit | Attending: Radiation Oncology | Admitting: Radiation Oncology

## 2014-06-06 DIAGNOSIS — C61 Malignant neoplasm of prostate: Secondary | ICD-10-CM | POA: Diagnosis not present

## 2014-06-07 ENCOUNTER — Ambulatory Visit: Payer: Medicare Other

## 2014-06-07 ENCOUNTER — Ambulatory Visit
Admission: RE | Admit: 2014-06-07 | Discharge: 2014-06-07 | Disposition: A | Discharge status: Home / Self Care | Payer: Medicare Other | Source: Ambulatory Visit | Attending: Radiation Oncology | Admitting: Radiation Oncology

## 2014-06-07 DIAGNOSIS — C61 Malignant neoplasm of prostate: Secondary | ICD-10-CM | POA: Diagnosis not present

## 2014-06-08 ENCOUNTER — Ambulatory Visit: Payer: Medicare Other

## 2014-06-08 ENCOUNTER — Ambulatory Visit
Admission: RE | Admit: 2014-06-08 | Discharge: 2014-06-08 | Disposition: A | Discharge status: Home / Self Care | Payer: Medicare Other | Source: Ambulatory Visit | Attending: Radiation Oncology | Admitting: Radiation Oncology

## 2014-06-08 DIAGNOSIS — C61 Malignant neoplasm of prostate: Secondary | ICD-10-CM | POA: Diagnosis not present

## 2014-06-09 ENCOUNTER — Encounter: Payer: Self-pay | Admitting: Radiation Oncology

## 2014-06-09 ENCOUNTER — Ambulatory Visit
Admission: RE | Admit: 2014-06-09 | Discharge: 2014-06-09 | Disposition: A | Discharge status: Home / Self Care | Payer: Medicare Other | Source: Ambulatory Visit | Attending: Radiation Oncology | Admitting: Radiation Oncology

## 2014-06-09 ENCOUNTER — Encounter (HOSPITAL_BASED_OUTPATIENT_CLINIC_OR_DEPARTMENT_OTHER)
Admission: RE | Admit: 2014-06-09 | Discharge: 2014-06-09 | Disposition: A | Discharge status: Home / Self Care | Payer: Medicare Other | Source: Ambulatory Visit | Attending: Urology | Admitting: Urology

## 2014-06-09 ENCOUNTER — Ambulatory Visit: Payer: Medicare Other

## 2014-06-09 ENCOUNTER — Ambulatory Visit (HOSPITAL_COMMUNITY)
Admission: RE | Admit: 2014-06-09 | Discharge: 2014-06-09 | Disposition: A | Discharge status: Home / Self Care | Payer: Medicare Other | Source: Ambulatory Visit | Attending: Urology | Admitting: Urology

## 2014-06-09 ENCOUNTER — Ambulatory Visit (HOSPITAL_BASED_OUTPATIENT_CLINIC_OR_DEPARTMENT_OTHER)
Admission: RE | Admit: 2014-06-09 | Discharge: 2014-06-09 | Disposition: A | Discharge status: Home / Self Care | Payer: Medicare Other | Source: Ambulatory Visit | Attending: Radiation Oncology | Admitting: Radiation Oncology

## 2014-06-09 VITALS — BP 133/53 | HR 64 | Resp 16 | Wt 169.7 lb

## 2014-06-09 DIAGNOSIS — C61 Malignant neoplasm of prostate: Secondary | ICD-10-CM | POA: Diagnosis not present

## 2014-06-09 DIAGNOSIS — R9431 Abnormal electrocardiogram [ECG] [EKG]: Secondary | ICD-10-CM | POA: Insufficient documentation

## 2014-06-09 DIAGNOSIS — Z01818 Encounter for other preprocedural examination: Secondary | ICD-10-CM | POA: Insufficient documentation

## 2014-06-09 NOTE — Progress Notes (Signed)
   Department of Radiation Oncology  Phone:  [phone] Fax:        [phone]  Weekly Treatment Note    Name: Mitchell Lowery Date: 06/09/2014 MRN: [id number] DOB: [birth year]   Current dose: 30.6 Gy  Current fraction:17   MEDICATIONS: Current Outpatient Prescriptions  Medication Sig Dispense Refill  . aspirin 81 MG tablet Take 81 mg by mouth daily.    . ciprofloxacin (CIPRO) 500 MG tablet Take 500 mg by mouth 2 (two) times daily.    . tamsulosin (FLOMAX) 0.4 MG CAPS capsule Take 1 capsule (0.4 mg total) by mouth daily after supper. 30 capsule 5   No current facility-administered medications for this encounter.     ALLERGIES: Review of patient's allergies indicates no known allergies.   LABORATORY DATA:  No results found for: WBC, HGB, HCT, MCV, PLT No results found for: NA, K, CL, CO2 No results found for: ALT, AST, GGT, ALKPHOS, BILITOT   NARRATIVE: KARLA VINES was seen today for weekly treatment management. The chart was checked and the patient's films were reviewed.  Weight and vitals stable. Denies pain. Reports nocturia x2. Confirms nocturia is less with the aid of Flomax. Denies dysuria or hematuria. Takes Imodium to manage diarrhea. Denies incontinence or leakage. Reports difficulty emptying his bladder has improved with Cipro. Patient reports he will be out of Cipro on Monday and is requesting a new script "just in case his fever returns." Patient questions need for EKG and chest xray prior to seed implantation. Explained these test are done on all patients to ensure clearance for surgery. Patient plans to challenge physician reference the need for either test. Advised to discuss with Dr. Tammi Klippel [facility] concerning refilling Cipro and to let staff know if he experiences a fever during his treatment. Has antibiotics through Monday on his current course.   PHYSICAL EXAMINATION: weight is 169 lb 11.2 oz (76.975 kg). His blood pressure is 133/53 and his pulse  is 64. His respiration is 16.        ASSESSMENT: The patient is doing satisfactorily with treatment.  PLAN: We will continue with the patient's radiation treatment as planned.        This document serves as a record of services personally performed by Kyung Rudd, MD. It was created on his behalf by Derek Mound, a trained medical scribe. The creation of this record is based on the scribe's personal observations and the provider's statements to them. This document has been checked and approved by the attending provider.

## 2014-06-09 NOTE — Progress Notes (Addendum)
Weight and vitals stable. Denis pain. Reports nocturia x2. Confirms nocturia is less with the aid of Flomax. Denies dysuria or hematuria. Takes Imodium to manage diarrhea. Denies incontinence or leakage. Reports difficulty emptying his bladder has improved with Cipro. Patient reports he will be out of Cipro on Monday and is requesting a new script "just in case his fever returns." Patient questions need for EKG and chest xray prior to seed implantation. Explained these test are done on all patients to ensure clearance for surgery. Patient plans to challenge physician reference the need for either test.

## 2014-06-12 ENCOUNTER — Ambulatory Visit
Admission: RE | Admit: 2014-06-12 | Discharge: 2014-06-12 | Disposition: A | Discharge status: Home / Self Care | Payer: Medicare Other | Source: Ambulatory Visit | Attending: Radiation Oncology | Admitting: Radiation Oncology

## 2014-06-12 ENCOUNTER — Ambulatory Visit: Payer: Medicare Other

## 2014-06-12 DIAGNOSIS — C61 Malignant neoplasm of prostate: Secondary | ICD-10-CM | POA: Diagnosis not present

## 2014-06-13 ENCOUNTER — Ambulatory Visit: Payer: Medicare Other

## 2014-06-13 ENCOUNTER — Ambulatory Visit
Admission: RE | Admit: 2014-06-13 | Discharge: 2014-06-13 | Disposition: A | Discharge status: Home / Self Care | Payer: Medicare Other | Source: Ambulatory Visit | Attending: Radiation Oncology | Admitting: Radiation Oncology

## 2014-06-13 DIAGNOSIS — C61 Malignant neoplasm of prostate: Secondary | ICD-10-CM | POA: Diagnosis not present

## 2014-06-14 ENCOUNTER — Ambulatory Visit: Payer: Medicare Other

## 2014-06-14 ENCOUNTER — Ambulatory Visit
Admission: RE | Admit: 2014-06-14 | Discharge: 2014-06-14 | Disposition: A | Discharge status: Home / Self Care | Payer: Medicare Other | Source: Ambulatory Visit | Attending: Radiation Oncology | Admitting: Radiation Oncology

## 2014-06-14 ENCOUNTER — Encounter: Payer: Self-pay | Admitting: Medical Oncology

## 2014-06-14 DIAGNOSIS — C61 Malignant neoplasm of prostate: Secondary | ICD-10-CM | POA: Diagnosis not present

## 2014-06-14 NOTE — Progress Notes (Signed)
.   Oncology Nurse Navigator Documentation  Oncology Nurse Navigator Flowsheets 06/14/2014  Navigator Encounter Type Treatment  Patient Visit Type Radonc  Barriers/Navigation Needs No barriers at this time  Interventions None required  Time Spent with Patient 15   Mitchell Lowery doing well with radiation. He will complete his treatments 06/20/2014. He states he is doing well. No more fevers after he took the Cipro. He is going on a short vacation and then he will have his seed implants done 07/21/14.

## 2014-06-15 ENCOUNTER — Ambulatory Visit
Admission: RE | Admit: 2014-06-15 | Discharge: 2014-06-15 | Disposition: A | Discharge status: Home / Self Care | Payer: Medicare Other | Source: Ambulatory Visit | Attending: Radiation Oncology | Admitting: Radiation Oncology

## 2014-06-15 ENCOUNTER — Encounter: Payer: Self-pay | Admitting: Radiation Oncology

## 2014-06-15 ENCOUNTER — Ambulatory Visit: Payer: Medicare Other

## 2014-06-15 VITALS — BP 111/51 | HR 61 | Resp 16 | Wt 170.4 lb

## 2014-06-15 DIAGNOSIS — C61 Malignant neoplasm of prostate: Secondary | ICD-10-CM | POA: Diagnosis not present

## 2014-06-15 MED ORDER — CIPROFLOXACIN HCL 500 MG PO TABS: 500.0000 mg | ORAL_TABLET | Freq: Two times a day (BID) | ORAL | Status: DC

## 2014-06-15 NOTE — Progress Notes (Signed)
  Radiation Oncology         [phone] ________________________________  Name: Mitchell Lowery MRN: [id number]  Date: 06/15/2014  DOB: 10-09-1934    Weekly Radiation Therapy Management    ICD-9-CM ICD-10-CM   1. Malignant neoplasm of prostate 185 C61 ciprofloxacin (CIPRO) 500 MG tablet    Current Dose: 37.8 Gy     Planned Dose:  45 Gy + seed implant  Narrative . . . . . . . . The patient presents for routine under treatment assessment.                                  Weight and vitals stable. Denies pain. Reports nocturia x 2. Reports that he continues to use Flomax as directed. Denies dysuria or hematuria. Reports he was able to swim twice this week. Reports diarrhea continues but, he is able to manage this with Imodium. BP lower today. Encouraged patient to force fluids to recover fluids lost from diarrhea. Patient verbalized understanding. Denies incontinence or leakage. Requesting refill of Cipro in the even prostatitis and fever return.  Feels as though he doesn't empty his bladder completely but is okay with this. He says he is doing better this week than last due to the Flomax. Has noticed some lightheadedness with the Flomax but this feeling seems to be dissipating the longer he takes it.                                 Set-up films were reviewed.                                 The chart was checked. Physical Findings. . .  weight is 170 lb 6.4 oz (77.293 kg). His blood pressure is 111/51 and his pulse is 61. His respiration is 16. . Weight essentially stable.  No significant changes. Impression . . . . . . . The patient is tolerating radiation. Plan . . . . . . . . . . . . Continue treatment as planned. I refilled Cipro for his recurrent bouts of prostatitis to be used as needed.  This document serves as a record of services personally performed by Tyler Pita, MD. It was created on his behalf by Arlyce Harman, a trained medical scribe. The creation of this record is  based on the scribe's personal observations and the provider's statements to them. This document has been checked and approved by the attending provider.     ________________________________  Sheral Apley. Tammi Klippel, M.D.

## 2014-06-15 NOTE — Progress Notes (Signed)
Weight and vitals stable. Denies pain. Reports nocturia x 2. Reports that he continues to use Flomax as directed. Denies dysuria or hematuria. Reports he was able to swim twice this week. Reports diarrhea continues but, he is able to manage this with Imodium. BP lower today. Encouraged patient to force fluids to recover fluids lost from diarrhea. Patient verbalized understanding. Denies incontinence or leakage. Requesting refill of Cipro in the even prostatitis and fever return.

## 2014-06-16 ENCOUNTER — Ambulatory Visit: Payer: Medicare Other

## 2014-06-16 ENCOUNTER — Ambulatory Visit
Admission: RE | Admit: 2014-06-16 | Discharge: 2014-06-16 | Disposition: A | Discharge status: Home / Self Care | Payer: Medicare Other | Source: Ambulatory Visit | Attending: Radiation Oncology | Admitting: Radiation Oncology

## 2014-06-16 DIAGNOSIS — C61 Malignant neoplasm of prostate: Secondary | ICD-10-CM | POA: Diagnosis not present

## 2014-06-16 NOTE — Progress Notes (Signed)
BP 111/51 mmHg  Pulse 61  Resp 16  Wt 170 lb 6.4 oz (77.293 kg) Wt Readings from Last 3 Encounters:  06/15/14 170 lb 6.4 oz (77.293 kg)  05/19/14 172 lb (78.019 kg)

## 2014-06-16 NOTE — Addendum Note (Signed)
Encounter addended by: Heywood Footman, RN on: 06/16/2014  3:03 PM<BR>     Documentation filed: Notes Section

## 2014-06-19 ENCOUNTER — Ambulatory Visit
Admission: RE | Admit: 2014-06-19 | Discharge: 2014-06-19 | Disposition: A | Discharge status: Home / Self Care | Payer: Medicare Other | Source: Ambulatory Visit | Attending: Radiation Oncology | Admitting: Radiation Oncology

## 2014-06-19 ENCOUNTER — Ambulatory Visit: Payer: Medicare Other

## 2014-06-19 DIAGNOSIS — C61 Malignant neoplasm of prostate: Secondary | ICD-10-CM | POA: Diagnosis not present

## 2014-06-20 ENCOUNTER — Ambulatory Visit
Admission: RE | Admit: 2014-06-20 | Discharge: 2014-06-20 | Disposition: A | Discharge status: Home / Self Care | Payer: Medicare Other | Source: Ambulatory Visit | Attending: Radiation Oncology | Admitting: Radiation Oncology

## 2014-06-20 ENCOUNTER — Ambulatory Visit: Payer: Medicare Other

## 2014-06-20 DIAGNOSIS — C61 Malignant neoplasm of prostate: Secondary | ICD-10-CM | POA: Diagnosis not present

## 2014-06-21 ENCOUNTER — Ambulatory Visit: Payer: Medicare Other

## 2014-06-21 ENCOUNTER — Encounter: Payer: Self-pay | Admitting: Radiation Oncology

## 2014-06-21 ENCOUNTER — Ambulatory Visit
Admission: RE | Admit: 2014-06-21 | Discharge: 2014-06-21 | Disposition: A | Discharge status: Home / Self Care | Payer: Medicare Other | Source: Ambulatory Visit | Attending: Radiation Oncology | Admitting: Radiation Oncology

## 2014-06-21 DIAGNOSIS — C61 Malignant neoplasm of prostate: Secondary | ICD-10-CM | POA: Diagnosis not present

## 2014-06-21 NOTE — Progress Notes (Signed)
  Radiation Oncology         ([phone] ________________________________  Name: Mitchell Lowery MRN: [id number]  Date: 06/21/2014  DOB: 11-May-1934  End of Treatment Note  Diagnosis:     ICD-10-CM  Malignant neoplasm of prostate C61   DIAGNOSIS: 80 y.o. gentleman with stage T1c adenocarcinoma of the prostate with a Gleason's score of 4+5 and a PSA of 10.4     Indication for treatment:  Curative, Definitive Radiotherapy plus planned seed implant    Radiation treatment dates:   05/17/2014-06/21/2014  Site/dose:   The prostate, seminal vesicles and pelvic lymph nodes were treated to 45 Gy in 25 fractions of 1.8 Gy  Beams/energy:   The patient was treated with IMRT using volumetric arc therapy delivering 6 MV X-rays to clockwise and counterclockwise circumferential arcs with a 90 degree collimator offset to avoid dose scalloping.  Image guidance was performed with daily cone beam CT prior to each fraction to align to gold markers in the prostate and assure proper bladder and rectal fill volumes.  Immobilization was achieved with BodyFix custom mold.  Narrative: The patient tolerated radiation treatment relatively well.   The patient experienced some minor urinary irritation and modest fatigue.    Plan: The patient has completed the external beam phase of his radiation treatment. He will return for prostate seed implant. I advised him to call or return sooner if he has any questions or concerns related to his recovery or treatment. ________________________________  Sheral Apley. Tammi Klippel, M.D.

## 2014-06-22 ENCOUNTER — Encounter: Payer: Self-pay | Admitting: Medical Oncology

## 2014-06-22 ENCOUNTER — Ambulatory Visit: Payer: Medicare Other

## 2014-06-22 NOTE — Progress Notes (Signed)
Oncology Nurse Navigator Documentation  Oncology Nurse Navigator Flowsheets 06/14/2014 06/22/2014  Navigator Encounter Type Treatment Telephone  Patient Visit Type Radonc -  Treatment Phase - Final Radiation Tx- Patient completed radiation on 06/21/14. He did well. His only complaint fatigue. He will return for seed implants 6/24.  Barriers/Navigation Needs No barriers at this time -  Interventions None required -  Time Spent with Patient 15 -

## 2014-06-23 ENCOUNTER — Ambulatory Visit: Payer: Medicare Other

## 2014-06-27 ENCOUNTER — Ambulatory Visit: Payer: Medicare Other

## 2014-07-13 ENCOUNTER — Telehealth: Payer: Self-pay | Admitting: *Deleted

## 2014-07-13 NOTE — Telephone Encounter (Signed)
Called patient to remind of labs for 07-14-14 for his implant on 07-21-14, spoke with patient and he is aware of this appt.

## 2014-07-14 ENCOUNTER — Encounter (HOSPITAL_BASED_OUTPATIENT_CLINIC_OR_DEPARTMENT_OTHER): Payer: Self-pay | Admitting: *Deleted

## 2014-07-14 DIAGNOSIS — D61818 Other pancytopenia: Secondary | ICD-10-CM | POA: Diagnosis not present

## 2014-07-14 DIAGNOSIS — C61 Malignant neoplasm of prostate: Secondary | ICD-10-CM | POA: Diagnosis not present

## 2014-07-14 LAB — PROTIME-INR
INR: 1.08 (ref 0.00–1.49)
Prothrombin Time: 14.2 seconds (ref 11.6–15.2)

## 2014-07-14 LAB — COMPREHENSIVE METABOLIC PANEL
ALK PHOS: 39 U/L (ref 38–126)
ALT: 17 U/L (ref 17–63)
AST: 20 U/L (ref 15–41)
Albumin: 3.5 g/dL (ref 3.5–5.0)
Anion gap: 9 (ref 5–15)
BILIRUBIN TOTAL: 0.7 mg/dL (ref 0.3–1.2)
BUN: 11 mg/dL (ref 6–20)
CHLORIDE: 106 mmol/L (ref 101–111)
CO2: 29 mmol/L (ref 22–32)
Calcium: 9.2 mg/dL (ref 8.9–10.3)
Creatinine, Ser: 0.71 mg/dL (ref 0.61–1.24)
GLUCOSE: 87 mg/dL (ref 65–99)
POTASSIUM: 3.7 mmol/L (ref 3.5–5.1)
SODIUM: 144 mmol/L (ref 135–145)
Total Protein: 7.5 g/dL (ref 6.5–8.1)

## 2014-07-14 LAB — CBC
HCT: 26.7 % — ABNORMAL LOW (ref 39.0–52.0)
HEMOGLOBIN: 8.5 g/dL — AB (ref 13.0–17.0)
MCH: 29.8 pg (ref 26.0–34.0)
MCHC: 31.8 g/dL (ref 30.0–36.0)
MCV: 93.7 fL (ref 78.0–100.0)
PLATELETS: 114 10*3/uL — AB (ref 150–400)
RBC: 2.85 MIL/uL — ABNORMAL LOW (ref 4.22–5.81)
RDW: 17.9 % — ABNORMAL HIGH (ref 11.5–15.5)
WBC: 1.5 10*3/uL — AB (ref 4.0–10.5)

## 2014-07-14 LAB — APTT: APTT: 31 s (ref 24–37)

## 2014-07-14 NOTE — Progress Notes (Signed)
To [facility] at 0930-Labs,Ekg,Cxr with chart-instructed Npo after Mn-to complete fleet enema prior to arrival .Message left with [facility] for Dr Diona Fanti about Hg of 8.5-patient stated he always keeps low level.

## 2014-07-19 DIAGNOSIS — D61818 Other pancytopenia: Secondary | ICD-10-CM | POA: Diagnosis not present

## 2014-07-19 DIAGNOSIS — C61 Malignant neoplasm of prostate: Secondary | ICD-10-CM | POA: Diagnosis not present

## 2014-07-20 ENCOUNTER — Telehealth: Payer: Self-pay | Admitting: *Deleted

## 2014-07-20 DIAGNOSIS — C61 Malignant neoplasm of prostate: Secondary | ICD-10-CM | POA: Diagnosis not present

## 2014-07-20 NOTE — Telephone Encounter (Signed)
CALLED PATIENT TO REMIND OF PROCEDURE FOR 07-21-14, LVM FOR A RETURN CALL

## 2014-07-21 ENCOUNTER — Ambulatory Visit (HOSPITAL_COMMUNITY): Payer: Medicare Other

## 2014-07-21 ENCOUNTER — Other Ambulatory Visit: Payer: Self-pay | Admitting: *Deleted

## 2014-07-21 ENCOUNTER — Encounter (HOSPITAL_BASED_OUTPATIENT_CLINIC_OR_DEPARTMENT_OTHER)
Admission: RE | Disposition: A | Discharge status: Home / Self Care | Payer: Self-pay | Source: Ambulatory Visit | Attending: Urology

## 2014-07-21 ENCOUNTER — Ambulatory Visit (HOSPITAL_BASED_OUTPATIENT_CLINIC_OR_DEPARTMENT_OTHER): Payer: Medicare Other | Admitting: Anesthesiology

## 2014-07-21 ENCOUNTER — Ambulatory Visit (HOSPITAL_BASED_OUTPATIENT_CLINIC_OR_DEPARTMENT_OTHER)
Admission: RE | Admit: 2014-07-21 | Discharge: 2014-07-21 | Disposition: A | Discharge status: Home / Self Care | Payer: Medicare Other | Source: Ambulatory Visit | Attending: Urology | Admitting: Urology

## 2014-07-21 ENCOUNTER — Encounter (HOSPITAL_BASED_OUTPATIENT_CLINIC_OR_DEPARTMENT_OTHER): Payer: Self-pay | Admitting: Anesthesiology

## 2014-07-21 DIAGNOSIS — C61 Malignant neoplasm of prostate: Secondary | ICD-10-CM

## 2014-07-21 DIAGNOSIS — D61818 Other pancytopenia: Secondary | ICD-10-CM | POA: Diagnosis not present

## 2014-07-21 DIAGNOSIS — Z01818 Encounter for other preprocedural examination: Secondary | ICD-10-CM

## 2014-07-21 HISTORY — PX: RADIOACTIVE SEED IMPLANT: SHX5150

## 2014-07-21 SURGERY — INSERTION, RADIATION SOURCE, PROSTATE
Anesthesia: General | Site: Prostate

## 2014-07-21 MED ORDER — FENTANYL CITRATE (PF) 100 MCG/2ML IJ SOLN
INTRAMUSCULAR | Status: DC | PRN
  Administered 2014-07-21: 25 ug via INTRAVENOUS
  Administered 2014-07-21: 50 ug via INTRAVENOUS
  Administered 2014-07-21: 25 ug via INTRAVENOUS

## 2014-07-21 MED ORDER — HYDROCODONE-ACETAMINOPHEN 5-325 MG PO TABS: 1.0000 | ORAL_TABLET | ORAL | Status: DC | PRN

## 2014-07-21 MED ORDER — EPHEDRINE SULFATE 50 MG/ML IJ SOLN
INTRAMUSCULAR | Status: DC | PRN
  Administered 2014-07-21: 10 mg via INTRAVENOUS
  Administered 2014-07-21: 5 mg via INTRAVENOUS

## 2014-07-21 MED ORDER — DEXAMETHASONE SODIUM PHOSPHATE 4 MG/ML IJ SOLN
INTRAMUSCULAR | Status: DC | PRN
  Administered 2014-07-21: 10 mg via INTRAVENOUS

## 2014-07-21 MED ORDER — CIPROFLOXACIN IN D5W 400 MG/200ML IV SOLN
INTRAVENOUS | Status: AC
  Filled 2014-07-21: qty 200

## 2014-07-21 MED ORDER — FENTANYL CITRATE (PF) 100 MCG/2ML IJ SOLN
INTRAMUSCULAR | Status: AC
  Filled 2014-07-21: qty 4

## 2014-07-21 MED ORDER — CIPROFLOXACIN IN D5W 400 MG/200ML IV SOLN
400.0000 mg | INTRAVENOUS | Status: AC
  Administered 2014-07-21: 400 mg via INTRAVENOUS
  Filled 2014-07-21: qty 200

## 2014-07-21 MED ORDER — CIPROFLOXACIN HCL 250 MG PO TABS: 250.0000 mg | ORAL_TABLET | Freq: Two times a day (BID) | ORAL | Status: DC

## 2014-07-21 MED ORDER — IOHEXOL 350 MG/ML SOLN
INTRAVENOUS | Status: DC | PRN
  Administered 2014-07-21: 7 mL

## 2014-07-21 MED ORDER — FENTANYL CITRATE (PF) 100 MCG/2ML IJ SOLN
25.0000 ug | INTRAMUSCULAR | Status: DC | PRN
  Filled 2014-07-21: qty 1

## 2014-07-21 MED ORDER — LIDOCAINE HCL (CARDIAC) 20 MG/ML IV SOLN
INTRAVENOUS | Status: DC | PRN
  Administered 2014-07-21: 60 mg via INTRAVENOUS

## 2014-07-21 MED ORDER — STERILE WATER FOR IRRIGATION IR SOLN
Status: DC | PRN
  Administered 2014-07-21: 3000 mL via INTRAVESICAL

## 2014-07-21 MED ORDER — LACTATED RINGERS IV SOLN
INTRAVENOUS | Status: DC
  Administered 2014-07-21 (×2): via INTRAVENOUS
  Filled 2014-07-21: qty 1000

## 2014-07-21 MED ORDER — PROPOFOL 10 MG/ML IV BOLUS
INTRAVENOUS | Status: DC | PRN
  Administered 2014-07-21: 150 mg via INTRAVENOUS
  Administered 2014-07-21: 50 mg via INTRAVENOUS

## 2014-07-21 MED ORDER — ACETAMINOPHEN 10 MG/ML IV SOLN
INTRAVENOUS | Status: DC | PRN
  Administered 2014-07-21: 1000 mg via INTRAVENOUS

## 2014-07-21 MED ORDER — FLEET ENEMA 7-19 GM/118ML RE ENEM
1.0000 | ENEMA | Freq: Once | RECTAL | Status: DC
  Filled 2014-07-21: qty 1

## 2014-07-21 SURGICAL SUPPLY — 25 items
BAG URINE DRAINAGE (UROLOGICAL SUPPLIES) ×3 IMPLANT
BLADE CLIPPER SURG (BLADE) ×3 IMPLANT
CATH FOLEY 2WAY SLVR  5CC 16FR (CATHETERS) ×2
CATH FOLEY 2WAY SLVR 5CC 16FR (CATHETERS) ×1 IMPLANT
CATH ROBINSON RED A/P 20FR (CATHETERS) ×3 IMPLANT
COVER BACK TABLE 60X90IN (DRAPES) ×3 IMPLANT
COVER MAYO STAND STRL (DRAPES) ×3 IMPLANT
DRSG TEGADERM 4X4.75 (GAUZE/BANDAGES/DRESSINGS) ×3 IMPLANT
DRSG TEGADERM 8X12 (GAUZE/BANDAGES/DRESSINGS) ×3 IMPLANT
GAUZE SPONGE 4X4 12PLY STRL (GAUZE/BANDAGES/DRESSINGS) ×3 IMPLANT
GLOVE BIO SURGEON STRL SZ7.5 (GLOVE) ×3 IMPLANT
GLOVE BIO SURGEON STRL SZ8 (GLOVE) ×6 IMPLANT
GLOVE ECLIPSE 8.0 STRL XLNG CF (GLOVE) ×6 IMPLANT
GLOVE INDICATOR 6.5 STRL GRN (GLOVE) ×6 IMPLANT
GOWN STRL REUS W/ TWL LRG LVL3 (GOWN DISPOSABLE) ×1 IMPLANT
GOWN STRL REUS W/ TWL XL LVL3 (GOWN DISPOSABLE) ×1 IMPLANT
GOWN STRL REUS W/TWL LRG LVL3 (GOWN DISPOSABLE) ×2
GOWN STRL REUS W/TWL XL LVL3 (GOWN DISPOSABLE) ×2
Nucletron selectSeed I-125 ×207 IMPLANT
PACK CYSTO (CUSTOM PROCEDURE TRAY) ×3 IMPLANT
SYRINGE 10CC LL (SYRINGE) ×3 IMPLANT
TOWEL OR 17X24 6PK STRL BLUE (TOWEL DISPOSABLE) ×3 IMPLANT
UNDERPAD 30X30 INCONTINENT (UNDERPADS AND DIAPERS) ×6 IMPLANT
WATER STERILE IRR 3000ML UROMA (IV SOLUTION) ×3 IMPLANT
WATER STERILE IRR 500ML POUR (IV SOLUTION) ×3 IMPLANT

## 2014-07-21 NOTE — Anesthesia Preprocedure Evaluation (Addendum)
Anesthesia Evaluation  Patient identified by MRN, date of birth, ID band Patient awake    Reviewed: Allergy & Precautions, NPO status , Patient's Chart, lab work & pertinent test results  History of Anesthesia Complications Negative for: history of anesthetic complications  Airway Mallampati: II  TM Distance: >3 FB Neck ROM: Full    Dental  (+) Dental Advisory Given   Pulmonary neg pulmonary ROS,  breath sounds clear to auscultation        Cardiovascular - anginanegative cardio ROS  Rhythm:Regular Rate:Normal     Neuro/Psych negative neurological ROS     GI/Hepatic negative GI ROS, Neg liver ROS,   Endo/Other  negative endocrine ROS  Renal/GU negative Renal ROS     Musculoskeletal   Abdominal   Peds  Hematology  (+) Blood dyscrasia (Hb 8.5), ,   Anesthesia Other Findings Prostate cancer  Reproductive/Obstetrics                            Anesthesia Physical Anesthesia Plan  ASA: III  Anesthesia Plan: General   Post-op Pain Management:    Induction: Intravenous  Airway Management Planned: LMA  Additional Equipment:   Intra-op Plan:   Post-operative Plan:   Informed Consent: I have reviewed the patients History and Physical, chart, labs and discussed the procedure including the risks, benefits and alternatives for the proposed anesthesia with the patient or authorized representative who has indicated his/her understanding and acceptance.   Dental advisory given  Plan Discussed with: CRNA and Surgeon  Anesthesia Plan Comments: (Plan routine monitors, GA- LMA OK)        Anesthesia Quick Evaluation

## 2014-07-21 NOTE — H&P (Signed)
Urology History and Physical Exam  CC: Prostate cancer  HPI: 80 year old male presents for I125 brachytherapy to complete combination radiotherapy. His history is as follows:  He underwent ultrasound and biopsy of his prostate on 01/26/2014. At that time, his PSA was 10.3. He had a negative biopsy over 20 years ago, by report.  4/12 cores taken at the time of biopsy was positive for adenocarcinoma as follows: Left apex-both cores revealed Gleason 4+5 = 9 adenocarcinoma, one revealed 50%, 1 revealed 90% of core involved Left mid medial, Gleason 3+4 equal 7, 20% of core Left mid lateral, Gleason 4+3 = 7, 40% of core involved  Prostatic volume 41 mL , PSA density 0.25  He has been started on ADT, and has completed preliminary EBRT.  He has recently been found to have pancytopenia--this will be evaluated by hematology.   PMH: Past Medical History  Diagnosis Date  . Prostate cancer   . Prostatitis   . Anal fistula     treated in [place]    [place]: Past Surgical History  Procedure Laterality Date  . Prostate biopsy    . Prostate biopsy    . Anal fistulectomy    . Ankle surgery      Allergies: No Known Allergies  Medications: No prescriptions prior to admission     Social History: History   Social History  . Marital Status: Unknown    Spouse Name: N/A  . Number of Children: N/A  . Years of Education: N/A   Occupational History  . Not on file.   Social History Main Topics  . Smoking status: Never Smoker   . Smokeless tobacco: Never Used  . Alcohol Use: Yes  . Drug Use: No  . Sexual Activity: Yes   Other Topics Concern  . Not on file   Social History Narrative    Family History: Family History  Problem Relation Age of Onset  . Cancer Maternal Grandfather     mouth/throat ca    Review of Systems: Positive: Nocturia, DOE Negative:  A further 10 point review of systems was negative except what is listed in the HPI.                  Physical  Exam: Constitutional: Well nourished and well developed . No acute distress.  ENT:. The ears and nose are normal in appearance.  Neck: The appearance of the neck is normal.  Pulmonary: No respiratory distress and normal respiratory rhythm and effort.  Cardiovascular:. No peripheral edema.  Abdomen: The abdomen is flat. The abdomen is soft and nontender. No masses are palpated. No CVA tenderness. No hernias are palpable. No hepatosplenomegaly noted.  Rectal: Rectal exam demonstrates normal sphincter tone, the anus is normal on inspection., no tenderness and no masses. Prostate size is estimated to be 60 g. Normal rectal tone, no rectal masses, prostate is smooth, symmetric and non-tender. The prostate has no nodularity and is not tender. The left seminal vesicle is nonpalpable. The right seminal vesicle is nonpalpable. The perineum is normal on inspection.  Genitourinary: Examination of the penis demonstrates no discharge, no masses, no lesions and a normal meatus. The penis is circumcised. The scrotum is normal in appearance and without lesions. The right epididymis is palpably normal and non-tender. The left epididymis is palpably normal and non-tender. The right testis is palpably normal, non-tender and without masses. The left testis is normal, non-tender and without masses.  Lymphatics: The femoral and inguinal nodes are not enlarged or  tender.  Skin: Normal skin turgor, no visible rash and no visible skin lesions.  Neuro/Psych:. Mood and affect are appropriate.   Studies:  No results for input(s): HGB, WBC, PLT in the last 72 hours.  No results for input(s): NA, K, CL, CO2, BUN, CREATININE, CALCIUM, GFRNONAA, GFRAA in the last 72 hours.  Invalid input(s): MAGNESIUM   No results for input(s): INR, APTT in the last 72 hours.  Invalid input(s): PT   Invalid input(s): ABG    Assessment:  PCA, high risk group, initiated on ADT, completed 25 Rx's of EBRT  Plan: I-125  brachytherapy

## 2014-07-21 NOTE — Op Note (Signed)
Preoperative diagnosis: Clinical stage TI C adenocarcinoma the prostate   Postoperative diagnosis: Same   Procedure: I-125 prostate seed implantation with Nucletron robotic implanter   Surgeon: Lillette Boxer. Hafsah Hendler M.D.  Radiation Oncologist:[place]  Anesthesia: Gen.   Indications: Patient  was diagnosed with clinical stage TI C but high risk prostate cancer. We had extensive discussion with him about treatment options versus. He elected to proceed with seed implantation following 25 EBRT treatments. He underwent consultation my office as well as with Dr. Tammi Klippel. He has been started on ADT as well. He appeared to understand the advantages disadvantages potential risks of this treatment option. Full informed consent has been obtained.   Technique and findings: Patient was brought the operating room where he had successful induction of general anesthesia. He was placed in dorso-lithotomy position and prepped and draped in usual manner. Appropriate surgical timeout was performed. Radiation oncology department placed a transrectal ultrasound probe anchoring stand. Foley catheter with contrast in the balloon was inserted without difficulty. Anchoring needles were placed within the prostate. Rectal tube was placed. Real-time contouring of the urethra prostate and rectum were performed and the dosing parameters were established. Targeted dose was 110 gray.  I was then called  to the operating suite suite for placement of the needles. A second timeout was performed. All needle passage was done with real-time transrectal ultrasound guidance with the sagittal plane. A total of 24 needles were placed. The implantation itself was done with the robotic implanter. 69 active seeds were implanted. The Foley catheter was removed and flexible cystoscopy failed to show any seeds outside the prostate.  The patient was brought to recovery room in stable condition, having tolerated the procedure well.Marland Kitchen

## 2014-07-21 NOTE — Discharge Instructions (Signed)
Radioactive Seed Implant Home Care Instructions ° ° °Activity:    Rest for the remainder of the day.  Do not drive or operate equipment today.  You may resume normal  activities in a few days as instructed by your physician, without risk of harmful radiation exposure to those around you, provided you follow the time and distance precautions on the Radiation Oncology Instruction Sheet. ° ° °Meals: Drink plenty of lipuids and eat light foods, such as gelatin or soup this evening .  You may return to normal meal plan tomorrow. ° °Return °To Work: You may return to work as instructed by your physician. ° °Special °Instruction:   If any seeds are found, use tweezers to pick up seeds and place in a glass container of any kind and bring to your physician's office. ° °Call your physician if any of these symptoms occur: ° °· Persistent or heavy bleeding °· Urine stream diminishes or stops completely after catheter is removed °· Fever equal to or greater than 101 degrees F °· Cloudy urine with a strong foul odor °· Severe pain ° °You may feel some burning pain and/or hesitancy when you urinate after the catheter is removed.  These symptoms may increase over the next few weeks, but should diminish within forur to six weeks.  Applying moist heat to the lower abdomen or a hot tub bath may help relieve the pain.  If the discomfort becomes severe, please call your physician for additional medications. ° ° °Post Anesthesia Home Care Instructions ° °Activity: °Get plenty of rest for the remainder of the day. A responsible adult should stay with you for 24 hours following the procedure.  °For the next 24 hours, DO NOT: °-Drive a car °-Operate machinery °-Drink alcoholic beverages °-Take any medication unless instructed by your physician °-Make any legal decisions or sign important papers. ° °Meals: °Start with liquid foods such as gelatin or soup. Progress to regular foods as tolerated. Avoid greasy, spicy, heavy foods. If nausea  and/or vomiting occur, drink only clear liquids until the nausea and/or vomiting subsides. Call your physician if vomiting continues. ° °Special Instructions/Symptoms: °Your throat may feel dry or sore from the anesthesia or the breathing tube placed in your throat during surgery. If this causes discomfort, gargle with warm salt water. The discomfort should disappear within 24 hours. ° °If you had a scopolamine patch placed behind your ear for the management of post- operative nausea and/or vomiting: ° °1. The medication in the patch is effective for 72 hours, after which it should be removed.  Wrap patch in a tissue and discard in the trash. Wash hands thoroughly with soap and water. °2. You may remove the patch earlier than 72 hours if you experience unpleasant side effects which may include dry mouth, dizziness or visual disturbances. °3. Avoid touching the patch. Wash your hands with soap and water after contact with the patch. °  ° ° ° °

## 2014-07-21 NOTE — Transfer of Care (Signed)
Immediate Anesthesia Transfer of Care Note  Patient: Mitchell Lowery  Procedure(s) Performed: Procedure(s): RADIOACTIVE SEED IMPLANT/BRACHYTHERAPY IMPLANT (N/A)  Patient Location: PACU  Anesthesia Type:General  Level of Consciousness: awake and oriented  Airway & Oxygen Therapy: Patient Spontanous Breathing and Patient connected to nasal cannula oxygen  Post-op Assessment: Report given to RN  Post vital signs: Reviewed and stable  Last Vitals:  Filed Vitals:   07/21/14 0934  BP: 134/56  Pulse: 59  Temp: 36.4 C  Resp: 16    Complications: No apparent anesthesia complications

## 2014-07-21 NOTE — Progress Notes (Signed)
  Radiation Oncology         [phone] ________________________________  Name: Mitchell Lowery MRN: [id number]  Date: 07/22/2014  DOB: 1934/10/17         Prostate Seed Implant  DIAGNOSIS: 80 y.o. gentleman with stage T1c adenocarcinoma of the prostate with a Gleason's score of 4+5 and a PSA of 10.4  PROCEDURE: Insertion of radioactive I-125 seeds into the prostate gland.  RADIATION DOSE: 145 Gy, definitive therapy.  TECHNIQUE: Mitchell Lowery was brought to the operating room with the urologist. He was placed in the dorsolithotomy position. He was catheterized and a rectal tube was inserted. The perineum was shaved, prepped and draped. The ultrasound probe was then introduced into the rectum to see the prostate gland.  TREATMENT DEVICE: A needle grid was attached to the ultrasound probe stand and anchor needles were placed.  3D PLANNING: The prostate was imaged in 3D using a sagittal sweep of the prostate probe. These images were transferred to the planning computer. There, the prostate, urethra and rectum were defined on each axial reconstructed image. Then, the software created an optimized 3D plan and a few seed positions were adjusted. The quality of the plan was reviewed using [facility] information for the target and the following two organs at risk:  Urethra and Rectum.  Then the accepted plan was uploaded to the seed Selectron afterloading unit.  PROSTATE VOLUME STUDY:  Using transrectal ultrasound the volume of the prostate was verified to be 34 cc.  SPECIAL TREATMENT PROCEDURE/SUPERVISION AND HANDLING: The Nucletron FIRST system was used to place the needles under sagittal guidance. A total of 24 needles were used to deposit 69 seeds in the prostate gland. The individual seed activity was 0.33 mCi for a total implant activity of 22.77 mCi.  COMPLEX SIMULATION: At the end of the procedure, an anterior radiograph of the pelvis was obtained to document seed positioning and count.  Cystoscopy was performed to check the urethra and bladder.  MICRODOSIMETRY: At the end of the procedure, the patient was emitting 0.14 mrem/hr at 1 meter. Accordingly, he was considered safe for hospital discharge.  PLAN: The patient will return to the radiation oncology clinic for post implant CT dosimetry in three weeks.   ________________________________  Sheral Apley Tammi Klippel, M.D.

## 2014-07-21 NOTE — Anesthesia Postprocedure Evaluation (Signed)
  Anesthesia Post-op Note  Patient: Mitchell Lowery  Procedure(s) Performed: Procedure(s): RADIOACTIVE SEED IMPLANT/BRACHYTHERAPY IMPLANT (N/A)  Patient Location: PACU  Anesthesia Type:General  Level of Consciousness: awake, alert , oriented and patient cooperative  Airway and Oxygen Therapy: Patient Spontanous Breathing and Patient connected to nasal cannula oxygen  Post-op Pain: none  Post-op Assessment: Post-op Vital signs reviewed, Patient's Cardiovascular Status Stable, Respiratory Function Stable, Patent Airway, No signs of Nausea or vomiting and Pain level controlled              Post-op Vital Signs: Reviewed and stable  Last Vitals:  Filed Vitals:   07/21/14 1257  BP: 129/58  Pulse: 59  Temp: 36.4 C  Resp: 23    Complications: No apparent anesthesia complications

## 2014-07-21 NOTE — Anesthesia Procedure Notes (Signed)
Procedure Name: LMA Insertion Date/Time: 07/21/2014 11:07 AM Performed by: Bethena Roys T Pre-anesthesia Checklist: Patient identified, Emergency Drugs available, Suction available and Patient being monitored Patient Re-evaluated:Patient Re-evaluated prior to inductionOxygen Delivery Method: Circle System Utilized Preoxygenation: Pre-oxygenation with 100% oxygen Intubation Type: IV induction Ventilation: Mask ventilation without difficulty LMA: LMA inserted LMA Size: 5.0 Number of attempts: 1 Airway Equipment and Method: Bite block Placement Confirmation: positive ETCO2 Dental Injury: Teeth and Oropharynx as per pre-operative assessment

## 2014-07-24 ENCOUNTER — Telehealth: Payer: Self-pay | Admitting: Oncology

## 2014-07-24 ENCOUNTER — Encounter (HOSPITAL_BASED_OUTPATIENT_CLINIC_OR_DEPARTMENT_OTHER): Payer: Self-pay | Admitting: Urology

## 2014-07-24 NOTE — Telephone Encounter (Signed)
NEW PATIENT APPT-S/W PATIENT AND GAVE NP APPT FOR 07/14 @ 1:30 W/DR. SHERRILL.  [place]

## 2014-07-29 ENCOUNTER — Emergency Department (HOSPITAL_COMMUNITY)
Admission: EM | Admit: 2014-07-29 | Discharge: 2014-07-29 | Disposition: A | Discharge status: Home / Self Care | Payer: Medicare Other | Attending: Emergency Medicine | Admitting: Emergency Medicine

## 2014-07-29 ENCOUNTER — Encounter (HOSPITAL_COMMUNITY): Payer: Self-pay | Admitting: Emergency Medicine

## 2014-07-29 DIAGNOSIS — C61 Malignant neoplasm of prostate: Secondary | ICD-10-CM | POA: Diagnosis not present

## 2014-07-29 DIAGNOSIS — Z87448 Personal history of other diseases of urinary system: Secondary | ICD-10-CM | POA: Insufficient documentation

## 2014-07-29 DIAGNOSIS — Z792 Long term (current) use of antibiotics: Secondary | ICD-10-CM | POA: Diagnosis not present

## 2014-07-29 DIAGNOSIS — Z8719 Personal history of other diseases of the digestive system: Secondary | ICD-10-CM | POA: Insufficient documentation

## 2014-07-29 DIAGNOSIS — R531 Weakness: Secondary | ICD-10-CM | POA: Diagnosis not present

## 2014-07-29 DIAGNOSIS — Z7982 Long term (current) use of aspirin: Secondary | ICD-10-CM | POA: Diagnosis not present

## 2014-07-29 DIAGNOSIS — R011 Cardiac murmur, unspecified: Secondary | ICD-10-CM | POA: Insufficient documentation

## 2014-07-29 DIAGNOSIS — D849 Immunodeficiency, unspecified: Secondary | ICD-10-CM | POA: Diagnosis not present

## 2014-07-29 DIAGNOSIS — R42 Dizziness and giddiness: Secondary | ICD-10-CM | POA: Diagnosis not present

## 2014-07-29 LAB — BASIC METABOLIC PANEL
ANION GAP: 10 (ref 5–15)
BUN: 12 mg/dL (ref 6–20)
CALCIUM: 9.1 mg/dL (ref 8.9–10.3)
CHLORIDE: 105 mmol/L (ref 101–111)
CO2: 25 mmol/L (ref 22–32)
CREATININE: 0.69 mg/dL (ref 0.61–1.24)
GFR calc Af Amer: >60 mL/min (ref >60)
GFR calc non Af Amer: >60 mL/min (ref >60)
GLUCOSE: 111 mg/dL — AB (ref 65–99)
POTASSIUM: 4 mmol/L (ref 3.5–5.1)
Sodium: 140 mmol/L (ref 135–145)

## 2014-07-29 LAB — CBC
HCT: 27 % — ABNORMAL LOW (ref 39.0–52.0)
Hemoglobin: 8.5 g/dL — ABNORMAL LOW (ref 13.0–17.0)
MCH: 29.8 pg (ref 26.0–34.0)
MCHC: 31.5 g/dL (ref 30.0–36.0)
MCV: 94.7 fL (ref 78.0–100.0)
PLATELETS: 113 10*3/uL — AB (ref 150–400)
RBC: 2.85 MIL/uL — ABNORMAL LOW (ref 4.22–5.81)
RDW: 18.9 % — AB (ref 11.5–15.5)
WBC: 3.3 10*3/uL — ABNORMAL LOW (ref 4.0–10.5)

## 2014-07-29 LAB — TROPONIN I

## 2014-07-29 NOTE — ED Provider Notes (Addendum)
CSN: [id number]     Arrival date & time 07/29/14  1143 History   First MD Initiated Contact with Patient 07/29/14 1225     Chief Complaint  Patient presents with  . Weakness     (Consider location/radiation/quality/duration/timing/severity/associated sxs/prior Treatment) HPI Complains of generalized weakness and lightheadedness today onset while exercising. No chest pain no shortness of breath. No other associated symptoms. No treatment prior to coming here.Marland Kitchen He denies abdominal pain denies blood per rectum. Past Medical History  Diagnosis Date  . Prostate cancer   . Prostatitis   . Anal fistula     treated in [place]   Past Surgical History  Procedure Laterality Date  . Prostate biopsy    . Prostate biopsy    . Anal fistulectomy    . Ankle surgery    . Radioactive seed implant N/A 07/21/2014    Procedure: RADIOACTIVE SEED IMPLANT/BRACHYTHERAPY IMPLANT;  Surgeon: Franchot Gallo, MD;  Location: [facility];  Service: Urology;  Laterality: N/A;   Family History  Problem Relation Age of Onset  . Cancer Maternal Grandfather     mouth/throat ca   History  Substance Use Topics  . Smoking status: Never Smoker   . Smokeless tobacco: Never Used  . Alcohol Use: Yes    Review of Systems  HENT: Negative.   Respiratory: Negative.   Cardiovascular: Negative.   Gastrointestinal: Negative.   Musculoskeletal: Negative.   Skin: Negative.   Allergic/Immunologic: Positive for immunocompromised state.       Prostate cancer  Neurological: Positive for weakness.  Psychiatric/Behavioral: Negative.   All other systems reviewed and are negative.     Allergies  Review of patient's allergies indicates no known allergies.  Home Medications   Prior to Admission medications   Medication Sig Start Date End Date Taking? Authorizing Provider  aspirin 81 MG tablet Take 81 mg by mouth daily.    Historical Provider, MD  ciprofloxacin (CIPRO) 250 MG tablet Take 1 tablet (250  mg total) by mouth 2 (two) times daily. 07/21/14   Franchot Gallo, MD  HYDROcodone-acetaminophen (NORCO) 5-325 MG per tablet Take 1-2 tablets by mouth every 4 (four) hours as needed for moderate pain. 07/21/14   Franchot Gallo, MD  tamsulosin (FLOMAX) 0.4 MG CAPS capsule Take 1 capsule (0.4 mg total) by mouth daily after supper. 06/02/14   Tyler Pita, MD   BP 166/70 mmHg  Pulse 80  Temp(Src) 97.8 F (36.6 C) (Oral)  Resp 18  SpO2 96% Physical Exam  Constitutional: He is oriented to person, place, and time. He appears well-developed and well-nourished.  HENT:  Head: Normocephalic and atraumatic.  Eyes: Conjunctivae are normal. Pupils are equal, round, and reactive to light.  Neck: Neck supple. No tracheal deviation present. No thyromegaly present.  Cardiovascular: Normal rate and regular rhythm.   Murmur heard. 3/6 systolic murmur  Pulmonary/Chest: Effort normal and breath sounds normal.  Abdominal: Soft. Bowel sounds are normal. He exhibits no distension. There is no tenderness.  Musculoskeletal: Normal range of motion. He exhibits no edema or tenderness.  Neurological: He is alert and oriented to person, place, and time. No cranial nerve deficit. Coordination normal.  Not lightheaded on standing  Skin: Skin is warm and dry. No rash noted.  Psychiatric: He has a normal mood and affect.  Nursing note and vitals reviewed.   ED Course  Procedures (including critical care time) Labs Review Labs Reviewed  BASIC METABOLIC PANEL  CBC    Imaging Review No results found.  EKG Interpretation   Date/Time:  Saturday July 29 2014 13:47:53 EDT Ventricular Rate:  53 PR Interval:  164 QRS Duration: 91 QT Interval:  443 QTC Calculation: 416 R Axis:   61 Text Interpretation:  Sinus rhythm No significant change since last  tracing Confirmed by Sitlaly Gudiel  MD, Jaiven Graveline [phone]) on 07/29/2014 2:10:09 PM     2:35 PM patient resting comfortably. Asymptomatic. Results for orders placed  or performed during the hospital encounter of 03/55/97  Basic metabolic panel  Result Value Ref Range   Sodium 140 135 - 145 mmol/L   Potassium 4.0 3.5 - 5.1 mmol/L   Chloride 105 101 - 111 mmol/L   CO2 25 22 - 32 mmol/L   Glucose, Bld 111 (H) 65 - 99 mg/dL   BUN 12 6 - 20 mg/dL   Creatinine, Ser 0.69 0.61 - 1.24 mg/dL   Calcium 9.1 8.9 - 10.3 mg/dL   GFR calc non Af Amer >60 >60 mL/min   GFR calc Af Amer >60 >60 mL/min   Anion gap 10 5 - 15  CBC  Result Value Ref Range   WBC 3.3 (L) 4.0 - 10.5 K/uL   RBC 2.85 (L) 4.22 - 5.81 MIL/uL   Hemoglobin 8.5 (L) 13.0 - 17.0 g/dL   HCT 27.0 (L) 39.0 - 52.0 %   MCV 94.7 78.0 - 100.0 fL   MCH 29.8 26.0 - 34.0 pg   MCHC 31.5 30.0 - 36.0 g/dL   RDW 18.9 (H) 11.5 - 15.5 %   Platelets 113 (L) 150 - 400 K/uL  Troponin I  Result Value Ref Range   Troponin I <0.03 <0.031 ng/mL   Dg C-arm 1-60 Min-no Report  07/21/2014   CLINICAL DATA: seed implant   C-ARM 1-60 MINUTES  Fluoroscopy was utilized by the requesting physician.  No radiographic  interpretation.     MDM  Pt is anemic , but hgb and plateltes unchanged from 2 weeks ago . Doubtis sympo tomatic   from anemia. Urgent transfusion   Not needed Plan  encourage oral hydration cut flomax dose to every other day. F/u Dr gates Final diagnoses:  None   Dx#1 weakness  #2 anemia #3thromboycytopenia #4 heart murmur     Orlie Dakin, MD 07/29/14 1445  Orlie Dakin, MD 07/29/14 1447

## 2014-07-29 NOTE — ED Notes (Signed)
Awake. Verbally responsive. A/O x4. Resp even and unlabored. No audible adventitious breath sounds noted. ABC's intact.  

## 2014-07-29 NOTE — ED Notes (Signed)
MD at bedside. 

## 2014-07-29 NOTE — Discharge Instructions (Signed)
Make sure they stay well hydrated. Drink at least six 8 ounce glasses of water daily. Cut your Flomax dosage to 1 tablet every other day. Take Flomax bedtime. Call Dr. Inda Merlin on Tuesday, 08/01/2014 to schedule an office appointment. We heard a heart murmur upon listening to her heart today, it may need to be further investigated. Return if you feel worse for any reason

## 2014-07-29 NOTE — ED Notes (Addendum)
Pt reported generalized weakness, lightheadedness/dizziness but denies visual disturbances. Color pale. Pt had radiation with prostate seed implants and develop pancytopenia. Pt requesting CBC to ensure not anemic.

## 2014-08-03 ENCOUNTER — Ambulatory Visit (HOSPITAL_BASED_OUTPATIENT_CLINIC_OR_DEPARTMENT_OTHER): Payer: Medicare Other | Admitting: Oncology

## 2014-08-03 ENCOUNTER — Telehealth: Payer: Self-pay | Admitting: Oncology

## 2014-08-03 ENCOUNTER — Encounter: Payer: Self-pay | Admitting: Oncology

## 2014-08-03 ENCOUNTER — Telehealth: Payer: Self-pay | Admitting: *Deleted

## 2014-08-03 ENCOUNTER — Ambulatory Visit: Payer: Medicare Other

## 2014-08-03 ENCOUNTER — Ambulatory Visit (HOSPITAL_BASED_OUTPATIENT_CLINIC_OR_DEPARTMENT_OTHER): Payer: Medicare Other | Admitting: *Deleted

## 2014-08-03 ENCOUNTER — Other Ambulatory Visit: Payer: Self-pay | Admitting: *Deleted

## 2014-08-03 VITALS — BP 140/69 | HR 63 | Temp 98.9°F | Resp 18 | Ht 71.0 in | Wt 171.3 lb

## 2014-08-03 DIAGNOSIS — D61818 Other pancytopenia: Secondary | ICD-10-CM | POA: Diagnosis not present

## 2014-08-03 DIAGNOSIS — R0600 Dyspnea, unspecified: Secondary | ICD-10-CM

## 2014-08-03 DIAGNOSIS — C61 Malignant neoplasm of prostate: Secondary | ICD-10-CM

## 2014-08-03 DIAGNOSIS — D649 Anemia, unspecified: Secondary | ICD-10-CM

## 2014-08-03 DIAGNOSIS — D696 Thrombocytopenia, unspecified: Secondary | ICD-10-CM

## 2014-08-03 LAB — CBC WITH DIFFERENTIAL/PLATELET
BASO%: 0.3 % (ref 0.0–2.0)
Basophils Absolute: 0 10*3/uL (ref 0.0–0.1)
EOS ABS: 0.1 10*3/uL (ref 0.0–0.5)
EOS%: 2.2 % (ref 0.0–7.0)
HCT: 26.9 % — ABNORMAL LOW (ref 38.4–49.9)
HGB: 8.7 g/dL — ABNORMAL LOW (ref 13.0–17.1)
LYMPH%: 19.2 % (ref 14.0–49.0)
MCH: 31 pg (ref 27.2–33.4)
MCHC: 32.3 g/dL (ref 32.0–36.0)
MCV: 95.7 fL (ref 79.3–98.0)
MONO#: 0.3 10*3/uL (ref 0.1–0.9)
MONO%: 10.1 % (ref 0.0–14.0)
NEUT%: 68.2 % (ref 39.0–75.0)
NEUTROS ABS: 2.2 10*3/uL (ref 1.5–6.5)
Platelets: 102 10*3/uL — ABNORMAL LOW (ref 140–400)
RBC: 2.81 10*6/uL — AB (ref 4.20–5.82)
RDW: 18.6 % — AB (ref 11.0–14.6)
WBC: 3.2 10*3/uL — AB (ref 4.0–10.3)
lymph#: 0.6 10*3/uL — ABNORMAL LOW (ref 0.9–3.3)

## 2014-08-03 LAB — CHCC SMEAR

## 2014-08-03 LAB — DRAW EXTRA CLOT TUBE

## 2014-08-03 NOTE — Telephone Encounter (Signed)
Pt confirmed labs/ov per 07/07 POF, gave pt AVS and Calendar.... KJ °

## 2014-08-03 NOTE — Telephone Encounter (Signed)
new patient appt-s/w patient and gave np appt for 07/07 @ 2:30 w/[name]

## 2014-08-03 NOTE — Telephone Encounter (Signed)
Opened in error

## 2014-08-03 NOTE — Progress Notes (Signed)
[facility] Patient Consult   Referring MD: Creig Landin 80 y.o.  [birth year]    Reason for Referral: Pancytopenia   HPI: Mr. Shankel relocated to [facility] proximally one year ago. Dr. Inda Merlin is his primary physician. When he saw Dr. Inda Merlin on 04/24/2014 a CBC found the hemoglobin at 10.8, platelets 110,000, and white count 2.8 with an absolute neutrophil count of 1.3. A repeat CBC 05/22/2014 found a hemoglobin 10.6. He reports a history of borderline anemia when he lived in [place]. He saw a "oncologist "several years ago.  He was diagnosed with prostate cancer (stage TIc, Gleason 9) and completed external beam radiation between 05/17/2014 and 06/21/2014. He was treated with I-125 prostate seed implantation 07/21/2014. He is maintained on anti-androgen therapy.  On 07/14/2014 the chemotherapy returned at 8.5, platelet is 114,000, white count 1.5 with an MCV of 93.7.  Mr. Esbenshade reports dyspnea with exertion. He exercises regularly and has difficulty with this for the past several weeks.  Past Medical History  Diagnosis Date  . Prostate cancer (T1c, Gleason 4+5, PSA 10.4) external beam XRT completed 06/21/2014, I-125 seed implant 07/21/2014    . Prostatitis-intermittent acute prostatitis for many years    . Anal fistula     treated in [place]    Past Surgical History  Procedure Laterality Date  . Prostate biopsy    .     Marland Kitchen Anal fistulectomy    . Ankle surgery    . Radioactive seed implant N/A 07/21/2014    Procedure: RADIOACTIVE SEED IMPLANT/BRACHYTHERAPY IMPLANT;  Surgeon: Franchot Gallo, MD;  Location: [facility];  Service: Urology;  Laterality: N/A;    Medications: Reviewed  Allergies: No Known Allergies  Family history: He is an only child. No family history of cancer. His mother died at age 35. His father died at age 17-etiology unclear.  Social History:   He is a retired Teacher, adult education,  he currently at [employer]. He does not use cigarettes. He drinks 1 glass of wine per day. No history of alcohol use. He has a history of radiation exposure while working in a [employer] lab. No transfusion history. No risk factor for HIV or hepatitis.  History  Alcohol Use  . Yes    History  Smoking status  . Never Smoker   Smokeless tobacco  . Never Used      ROS:   Positives include: Fever when he has prostatitis, nocturia-improved with Flomax, diarrhea following prostate radiation-resolved, rectal urgency following prostate radiation, papules on the legs, hot flashes since beginning anti-androgen therapy  A complete ROS was otherwise negative.  Physical Exam:  Blood pressure 140/69, pulse 63, temperature 98.9 F (37.2 C), temperature source Oral, resp. rate 18, height '5\' 11"'  (1.803 m), weight 171 lb 4.8 oz (77.701 kg), SpO2 98 %.  HEENT: Oropharynx without visible mass, neck without mass Lungs: Clear bilaterally Cardiac: Regular rate and rhythm, 2/6 systolic murmur Abdomen: No hepatosplenomegaly, nontender, no mass GU: Testes without mass  Vascular: No leg edema Lymph nodes: No cervical, supraclavicular, axillary, or inguinal nodes Neurologic: Alert and oriented, the motor exam appears intact in the upper and lower extremities Skin: No rash, healing papules at the upper left and right eye, less than 1 cm slightly ulcerated lesion with surrounding erythema at the right medial thigh Musculoskeletal: No spine tenderness   LAB:  CBC  Lab Results  Component Value Date   WBC 3.2* 08/03/2014  HGB 8.7* 08/03/2014   HCT 26.9* 08/03/2014   MCV 95.7 08/03/2014   PLT 102* 08/03/2014   NEUTROABS 2.2 08/03/2014    Review of blood smear: There are a few hypolobated neutrophils. No blasts. The platelets appear mildly decreased in number. There is marked variation red cell size and shape. Numerous ovalocytes and teardrops. A few nucleated red cells. There are  polychromatophilic macrocytes.  CMP      Component Value Date/Time   NA 140 07/29/2014 1220   K 4.0 07/29/2014 1220   CL 105 07/29/2014 1220   CO2 25 07/29/2014 1220   GLUCOSE 111* 07/29/2014 1220   BUN 12 07/29/2014 1220   CREATININE 0.69 07/29/2014 1220   CALCIUM 9.1 07/29/2014 1220   PROT 7.5 07/14/2014 0930   ALBUMIN 3.5 07/14/2014 0930   AST 20 07/14/2014 0930   ALT 17 07/14/2014 0930   ALKPHOS 39 07/14/2014 0930   BILITOT 0.7 07/14/2014 0930   GFRNONAA >60 07/29/2014 1220   GFRAA >60 07/29/2014 1220      Assessment/Plan:   1. Pancytopenia  2. 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 His history suggests the hematologic changes have been present for at least several months and probably years. He is symptomatic with exertional dyspnea.  I discussed the differential diagnosis with [name]. The peripheral blood smear is consistent with a primary bone marrow disorder such as myelodysplasia or a myeloproliferative disorder. The differential diagnosis also includes metastatic prostate cancer and a hematopoietic malignancy.  He agrees to a diagnostic bone marrow biopsy. This will be scheduled in interventional radiology for as soon as possible.  He will return for an office visit after the bone marrow biopsy to discuss the pathology and treatment options.  [name] does not feel in need of a Red cell transfusion today.  Approximately 50 minutes were spent with the patient today. The majority of the time was used for counseling and coordination of care.  Pratham Cassatt 08/03/2014, 5:23 PM

## 2014-08-04 ENCOUNTER — Other Ambulatory Visit: Payer: Self-pay | Admitting: Radiology

## 2014-08-07 ENCOUNTER — Ambulatory Visit (HOSPITAL_COMMUNITY)
Admission: RE | Admit: 2014-08-07 | Discharge: 2014-08-07 | Disposition: A | Discharge status: Home / Self Care | Payer: Medicare Other | Source: Ambulatory Visit | Attending: Oncology | Admitting: Oncology

## 2014-08-07 ENCOUNTER — Encounter (HOSPITAL_COMMUNITY): Payer: Self-pay

## 2014-08-07 DIAGNOSIS — Z8546 Personal history of malignant neoplasm of prostate: Secondary | ICD-10-CM | POA: Insufficient documentation

## 2014-08-07 DIAGNOSIS — D4989 Neoplasm of unspecified behavior of other specified sites: Secondary | ICD-10-CM | POA: Diagnosis not present

## 2014-08-07 DIAGNOSIS — D61818 Other pancytopenia: Secondary | ICD-10-CM | POA: Diagnosis not present

## 2014-08-07 LAB — PROTIME-INR
INR: 1.11 (ref 0.00–1.49)
PROTHROMBIN TIME: 14.5 s (ref 11.6–15.2)

## 2014-08-07 LAB — FERRITIN CHCC: Ferritin: 308 ng/ml (ref 22–316)

## 2014-08-07 LAB — CBC
HEMATOCRIT: 28.2 % — AB (ref 39.0–52.0)
HEMOGLOBIN: 8.9 g/dL — AB (ref 13.0–17.0)
MCH: 30.5 pg (ref 26.0–34.0)
MCHC: 31.6 g/dL (ref 30.0–36.0)
MCV: 96.6 fL (ref 78.0–100.0)
Platelets: 122 10*3/uL — ABNORMAL LOW (ref 150–400)
RBC: 2.92 MIL/uL — ABNORMAL LOW (ref 4.22–5.81)
RDW: 18.1 % — AB (ref 11.5–15.5)
WBC: 2.1 10*3/uL — ABNORMAL LOW (ref 4.0–10.5)

## 2014-08-07 LAB — BONE MARROW EXAM

## 2014-08-07 LAB — APTT: aPTT: 30 seconds (ref 24–37)

## 2014-08-07 LAB — LACTATE DEHYDROGENASE (CC13): LDH: 170 U/L (ref 125–245)

## 2014-08-07 MED ORDER — MIDAZOLAM HCL 2 MG/2ML IJ SOLN
INTRAMUSCULAR | Status: AC | PRN
  Administered 2014-08-07 (×2): 1 mg via INTRAVENOUS

## 2014-08-07 MED ORDER — HYDROCODONE-ACETAMINOPHEN 5-325 MG PO TABS
1.0000 | ORAL_TABLET | ORAL | Status: DC | PRN
  Filled 2014-08-07: qty 2

## 2014-08-07 MED ORDER — FENTANYL CITRATE (PF) 100 MCG/2ML IJ SOLN
INTRAMUSCULAR | Status: AC | PRN
  Administered 2014-08-07: 50 ug via INTRAVENOUS

## 2014-08-07 MED ORDER — FENTANYL CITRATE (PF) 100 MCG/2ML IJ SOLN
INTRAMUSCULAR | Status: AC
  Filled 2014-08-07: qty 4

## 2014-08-07 MED ORDER — MIDAZOLAM HCL 2 MG/2ML IJ SOLN
INTRAMUSCULAR | Status: AC
  Filled 2014-08-07: qty 6

## 2014-08-07 MED ORDER — SODIUM CHLORIDE 0.9 % IV SOLN
Freq: Once | INTRAVENOUS | Status: AC
  Administered 2014-08-07: 08:00:00 via INTRAVENOUS

## 2014-08-07 NOTE — Procedures (Signed)
CT-guided  L iliac bone marrow aspiration and core biopsy No complication No blood loss. See complete dictation in [facility]

## 2014-08-07 NOTE — Discharge Instructions (Signed)
Bone Marrow Aspiration, Bone Marrow Biopsy Care After Read the instructions outlined below and refer to this sheet in the next few weeks. These discharge instructions provide you with general information on caring for yourself after you leave the hospital. Your caregiver may also give you specific instructions. While your treatment has been planned according to the most current medical practices available, unavoidable complications occasionally occur. If you have any problems or questions after discharge, call your caregiver. FINDING OUT THE RESULTS OF YOUR TEST Not all test results are available during your visit. If your test results are not back during the visit, make an appointment with your caregiver to find out the results. Do not assume everything is normal if you have not heard from your caregiver or the medical facility. It is important for you to follow up on all of your test results.  HOME CARE INSTRUCTIONS  You have had sedation and may be sleepy or dizzy. Your thinking may not be as clear as usual. For the next 24 hours:  Only take over-the-counter or prescription medicines for pain, discomfort, and or fever as directed by your caregiver.  Do not drink alcohol.  Do not smoke.  Do not drive.  Do not make important legal decisions.  Do not operate heavy machinery.  Do not care for small children by yourself.  Keep your dressing clean and dry. You may replace dressing with a bandage after 24 hours.  You may take a bath or shower after 24 hours.  Use an ice pack for 20 minutes every 2 hours while awake for pain as needed. SEEK MEDICAL CARE IF:   There is redness, swelling, or increasing pain at the biopsy site.  There is pus coming from the biopsy site.  There is drainage from a biopsy site lasting longer than one day.  An unexplained oral temperature above 102 F (38.9 C) develops. SEEK IMMEDIATE MEDICAL CARE IF:   You develop a rash.  You have difficulty  breathing.  You develop any reaction or side effects to medications given. Document Released: 08/02/2004 Document Revised: 04/07/2011 Document Reviewed: 01/11/2008 [facility] Patient Information 2015 [place], [place]. This information is not intended to replace advice given to you by your health care provider. Make sure you discuss any questions you have with your health care provider. Conscious Sedation, Adult, Care After Refer to this sheet in the next few weeks. These instructions provide you with information on caring for yourself after your procedure. Your health care provider may also give you more specific instructions. Your treatment has been planned according to current medical practices, but problems sometimes occur. Call your health care provider if you have any problems or questions after your procedure. WHAT TO EXPECT AFTER THE PROCEDURE  After your procedure:  You may feel sleepy, clumsy, and have poor balance for several hours.  Vomiting may occur if you eat too soon after the procedure. HOME CARE INSTRUCTIONS  Do not participate in any activities where you could become injured for at least 24 hours. Do not:  Drive.  Swim.  Ride a bicycle.  Operate heavy machinery.  Cook.  Use power tools.  Climb ladders.  Work from a high place.  Do not make important decisions or sign legal documents until you are improved.  If you vomit, drink water, juice, or soup when you can drink without vomiting. Make sure you have little or no nausea before eating solid foods.  Only take over-the-counter or prescription medicines for pain, discomfort, or fever  as directed by your health care provider.  Make sure you and your family fully understand everything about the medicines given to you, including what side effects may occur.  You should not drink alcohol, take sleeping pills, or take medicines that cause drowsiness for at least 24 hours.  If you smoke, do not smoke without  supervision.  If you are feeling better, you may resume normal activities 24 hours after you were sedated.  Keep all appointments with your health care provider. SEEK MEDICAL CARE IF:  Your skin is pale or bluish in color.  You continue to feel nauseous or vomit.  Your pain is getting worse and is not helped by medicine.  You have bleeding or swelling.  You are still sleepy or feeling clumsy after 24 hours. SEEK IMMEDIATE MEDICAL CARE IF:  You develop a rash.  You have difficulty breathing.  You develop any type of allergic problem.  You have a fever. MAKE SURE YOU:  Understand these instructions.  Will watch your condition.  Will get help right away if you are not doing well or get worse. Document Released: 11/03/2012 Document Reviewed: 11/03/2012 [facility] Patient Information 2015 [place], [place]. This information is not intended to replace advice given to you by your health care provider. Make sure you discuss any questions you have with your health care provider.

## 2014-08-07 NOTE — H&P (Signed)
Chief Complaint: "I'm here for a bone marrow biopsy"  Referring Physician(s): Sherrill,[name] B  History of Present Illness: Mitchell Lowery is a 80 y.o. male with history of prostate cancer and pancytopenia who presents today for CT guided bone marrow biopsy for further evaluation.   Past Medical History  Diagnosis Date  . Prostate cancer   . Prostatitis   . Anal fistula     treated in [place]    Past Surgical History  Procedure Laterality Date  . Prostate biopsy    . Prostate biopsy    . Anal fistulectomy    . Ankle surgery    . Radioactive seed implant N/A 07/21/2014    Procedure: RADIOACTIVE SEED IMPLANT/BRACHYTHERAPY IMPLANT;  Surgeon: Franchot Gallo, MD;  [facility];  Service: Urology;  Laterality: N/A;    Allergies: Review of patient's allergies indicates no known allergies.  Medications: Prior to Admission medications   Medication Sig Start Date End Date Taking? Authorizing Provider  tamsulosin (FLOMAX) 0.4 MG CAPS capsule Take 1 capsule (0.4 mg total) by mouth daily after supper. 06/02/14  Yes Tyler Pita, MD  HYDROcodone-acetaminophen (NORCO) 5-325 MG per tablet Take 1-2 tablets by mouth every 4 (four) hours as needed for moderate pain. Patient not taking: Reported on 07/29/2014 07/21/14   Franchot Gallo, MD  PRESCRIPTION MEDICATION Inject 1 each into the skin every 3 (three) months. ADT SEE CHART--Anti-testorone.    Historical Provider, MD     Family History  Problem Relation Age of Onset  . Cancer Maternal Grandfather     mouth/throat ca    History   Social History  . Marital Status: Unknown    Spouse Name: N/A  . Number of Children: N/A  . Years of Education: N/A   Social History Main Topics  . Smoking status: Never Smoker   . Smokeless tobacco: Never Used  . Alcohol Use: Yes  . Drug Use: No  . Sexual Activity: Yes   Other Topics Concern  . None   Social History Narrative      Review of Systems    Constitutional: Negative for fever and chills.  Respiratory: Positive for shortness of breath. Negative for cough.   Cardiovascular: Negative for chest pain.  Gastrointestinal: Negative for nausea, vomiting, abdominal pain and blood in stool.  Genitourinary: Negative for dysuria and hematuria.  Musculoskeletal: Negative for back pain.  Neurological: Negative for headaches.  Hematological: Does not bruise/bleed easily.    Vital Signs: BP 140/62 mmHg  Pulse 59  Temp(Src) 98.3 F (36.8 C) (Oral)  Resp 18  Ht _0  (1.803 m)  Wt 165 lb (74.844 kg)  BMI 23.02 kg/m2  SpO2 100%  Physical Exam  Constitutional: He is oriented to person, place, and time. He appears well-developed and well-nourished.  Cardiovascular: Normal rate and regular rhythm.   Murmur heard. Pulmonary/Chest: Effort normal and breath sounds normal.  Abdominal: Soft. Bowel sounds are normal. There is no tenderness.  Musculoskeletal: Normal range of motion. He exhibits no edema.  Neurological: He is alert and oriented to person, place, and time.    Mallampati Score:     Imaging: Dg C-arm 1-60 Min-no Report  07/21/2014   CLINICAL DATA: seed implant   C-ARM 1-60 MINUTES  Fluoroscopy was utilized by the requesting physician.  No radiographic  interpretation.     Labs:  CBC:  Recent Labs  07/14/14 0930 07/29/14 1220 08/03/14 1456 08/07/14 0730  WBC 1.5* 3.3* 3.2* 2.1*  HGB 8.5* 8.5*  8.7* 8.9*  HCT 26.7* 27.0* 26.9* 28.2*  PLT 114* 113* 102* 122*    COAGS:  Recent Labs  07/14/14 0930 08/07/14 0730  INR 1.08 1.11  APTT 31 30    BMP:  Recent Labs  07/14/14 0930 07/29/14 1220  NA 144 140  K 3.7 4.0  CL 106 105  CO2 29 25  GLUCOSE 87 111*  BUN 11 12  CALCIUM 9.2 9.1  CREATININE 0.71 0.69  GFRNONAA >60 >60  GFRAA >60 >60    LIVER FUNCTION TESTS:  Recent Labs  07/14/14 0930  BILITOT 0.7  AST 20  ALT 17  ALKPHOS 39  PROT 7.5  ALBUMIN 3.5    TUMOR MARKERS: No results for  input(s): AFPTM, CEA, CA199, CHROMGRNA in the last 8760 hours.  Assessment and Plan: Mitchell Lowery is a 80 y.o. male with history of prostate cancer and pancytopenia who presents today for CT guided bone marrow biopsy for further evaluation. Risks and benefits discussed with the patient including, but not limited to bleeding, infection, damage to adjacent structures or low yield requiring additional tests.All of the patient's questions were answered, patient is agreeable to proceed.Consent signed and in chart.       Signed: D. Rowe Robert 08/07/2014, 8:40 AM  I spent a total of 30 minutes in face to face in clinical consultation, greater than 50% of which was counseling/coordinating care for CT guided bone marrow biopsy

## 2014-08-09 ENCOUNTER — Other Ambulatory Visit: Payer: Self-pay | Admitting: *Deleted

## 2014-08-09 ENCOUNTER — Telehealth: Payer: Self-pay | Admitting: Oncology

## 2014-08-09 ENCOUNTER — Other Ambulatory Visit: Payer: Self-pay | Admitting: Oncology

## 2014-08-09 DIAGNOSIS — C9000 Multiple myeloma not having achieved remission: Secondary | ICD-10-CM

## 2014-08-09 NOTE — Telephone Encounter (Signed)
s.w. pt and advised on 7.15 appt

## 2014-08-10 ENCOUNTER — Ambulatory Visit: Payer: Medicare Other

## 2014-08-10 ENCOUNTER — Other Ambulatory Visit: Payer: Medicare Other

## 2014-08-10 ENCOUNTER — Telehealth: Payer: Self-pay | Admitting: *Deleted

## 2014-08-10 ENCOUNTER — Ambulatory Visit: Payer: Medicare Other | Admitting: Oncology

## 2014-08-10 NOTE — Telephone Encounter (Signed)
CALLED PATIENT TO REMIND OF APPTS. FOR 08-11-14, SPOKE WITH PATIENT AND HE IS AWARE OF THESE APPTS.

## 2014-08-11 ENCOUNTER — Other Ambulatory Visit (HOSPITAL_BASED_OUTPATIENT_CLINIC_OR_DEPARTMENT_OTHER): Payer: Medicare Other

## 2014-08-11 ENCOUNTER — Ambulatory Visit
Admit: 2014-08-11 | Discharge: 2014-08-11 | Disposition: A | Discharge status: Home / Self Care | Payer: Medicare Other | Attending: Radiation Oncology | Admitting: Radiation Oncology

## 2014-08-11 ENCOUNTER — Encounter: Payer: Self-pay | Admitting: Radiation Oncology

## 2014-08-11 VITALS — BP 131/64 | HR 58 | Resp 16 | Wt 171.9 lb

## 2014-08-11 DIAGNOSIS — Z510 Encounter for antineoplastic radiation therapy: Secondary | ICD-10-CM | POA: Insufficient documentation

## 2014-08-11 DIAGNOSIS — C61 Malignant neoplasm of prostate: Secondary | ICD-10-CM | POA: Diagnosis not present

## 2014-08-11 DIAGNOSIS — C9000 Multiple myeloma not having achieved remission: Secondary | ICD-10-CM | POA: Diagnosis not present

## 2014-08-11 DIAGNOSIS — D61818 Other pancytopenia: Secondary | ICD-10-CM

## 2014-08-11 LAB — CBC WITH DIFFERENTIAL/PLATELET
BASO%: 0.9 % (ref 0.0–2.0)
Basophils Absolute: 0 10*3/uL (ref 0.0–0.1)
EOS%: 4.1 % (ref 0.0–7.0)
Eosinophils Absolute: 0.1 10*3/uL (ref 0.0–0.5)
HCT: 27.8 % — ABNORMAL LOW (ref 38.4–49.9)
HGB: 8.8 g/dL — ABNORMAL LOW (ref 13.0–17.1)
LYMPH%: 16.9 % (ref 14.0–49.0)
MCH: 30.1 pg (ref 27.2–33.4)
MCHC: 31.7 g/dL — ABNORMAL LOW (ref 32.0–36.0)
MCV: 95.2 fL (ref 79.3–98.0)
MONO#: 0.3 10*3/uL (ref 0.1–0.9)
MONO%: 12.3 % (ref 0.0–14.0)
NEUT%: 65.8 % (ref 39.0–75.0)
NEUTROS ABS: 1.4 10*3/uL — AB (ref 1.5–6.5)
Platelets: 100 10*3/uL — ABNORMAL LOW (ref 140–400)
RBC: 2.92 10*6/uL — ABNORMAL LOW (ref 4.20–5.82)
RDW: 17.7 % — AB (ref 11.0–14.6)
WBC: 2.2 10*3/uL — AB (ref 4.0–10.3)
lymph#: 0.4 10*3/uL — ABNORMAL LOW (ref 0.9–3.3)
nRBC: 3 % — ABNORMAL HIGH (ref 0–0)

## 2014-08-11 LAB — DRAW EXTRA CLOT TUBE

## 2014-08-11 LAB — CHCC SMEAR

## 2014-08-11 NOTE — Progress Notes (Signed)
Radiation Oncology         [phone] ________________________________  Name: Mitchell Lowery MRN: [id number]  Date: 08/11/2014  DOB: [birth year]  Follow-Up Visit Note  CC: Henrine Screws, MD  Raynelle Bring, MD  DIAGNOSIS: 80 y.o. gentleman with stage T1c adenocarcinoma of the prostate with a Gleason's score of 4+5 and a PSA of 10.4    ICD-9-CM ICD-10-CM   1. Malignant neoplasm of prostate 185 C61     Interval Since Last Radiation:  3  weeks  Narrative:  The patient returns today for routine follow-up.  He is complaining of increased urinary frequency and urinary hesitation symptoms. He filled out a questionnaire regarding urinary function today providing and overall IPSS score of 22 characterizing his symptoms as severe.  His pre-implant score was 1. He denies any bowel symptoms.  He had pancytopenia prior to implant and recently got BMB with Sherrill showing 47% plasma cells consistent with myeloma.  ALLERGIES:  has No Known Allergies.  Meds: Current Outpatient Prescriptions  Medication Sig Dispense Refill  . tamsulosin (FLOMAX) 0.4 MG CAPS capsule Take 1 capsule (0.4 mg total) by mouth daily after supper. 30 capsule 5  . HYDROcodone-acetaminophen (NORCO) 5-325 MG per tablet Take 1-2 tablets by mouth every 4 (four) hours as needed for moderate pain. (Patient not taking: Reported on 07/29/2014) 15 tablet 0  . PRESCRIPTION MEDICATION Inject 1 each into the skin every 3 (three) months. ADT SEE CHART--Anti-testorone.     No current facility-administered medications for this encounter.    Physical Findings: The patient is in no acute distress. Patient is alert and oriented.  weight is 171 lb 14.4 oz (77.973 kg). His blood pressure is 131/64 and his pulse is 58. His respiration is 16. .  No significant changes.  Lab Findings: Lab Results  Component Value Date   WBC 2.2* 08/11/2014   HGB 8.8* 08/11/2014   HCT 27.8* 08/11/2014   MCV 95.2 08/11/2014   PLT 100* 08/11/2014      Radiographic Findings:  Patient underwent CT imaging in our clinic for post implant dosimetry. The CT appears to demonstrate an adequate distribution of radioactive seeds throughout the prostate gland. There no seeds in her near the rectum. I suspect the final radiation plan and dosimetry will show appropriate coverage of the prostate gland.   Impression: The patient is recovering from the effects of radiation. His urinary symptoms should gradually improve over the next 4-6 months. We talked about this today. He is encouraged by his improvement already and is otherwise please with his outcome.   Plan: Today, I spent time talking to the patient about his prostate seed implant and resolving urinary symptoms. We also talked about long-term follow-up for prostate cancer following seed implant. He understands that ongoing PSA determinations and digital rectal exams will help perform surveillance to rule out disease recurrence. He understands what to expect with his PSA measures. Patient was also educated today about some of the long-term effects from radiation including a small risk for rectal bleeding and possibly erectile dysfunction. We talked about some of the general management approaches to these potential complications. However, I did encourage the patient to contact our office or return at any point if he has questions or concerns related to his previous radiation and prostate cancer.  He should continue androgen deprivation for 2 years, given Gleason's 9 disease.  He will follow with [name] for myeloma work-up and treatment.  _____________________________________  Sheral Apley. Tammi Klippel, M.D.

## 2014-08-11 NOTE — Progress Notes (Signed)
  Radiation Oncology         [phone] ________________________________  Name: Mitchell Lowery MRN: [id number]  Date: 08/11/2014  DOB: Sep 02, 1934  COMPLEX SIMULATION NOTE  NARRATIVE:  The patient was brought to the [facility] today following prostate seed implantation approximately one month ago.  Identity was confirmed.  All relevant records and images related to the planned course of therapy were reviewed.  Then, the patient was set-up supine.  CT images were obtained.  The CT images were loaded into the planning software.  Then the prostate and rectum were contoured.  Treatment planning then occurred.  The implanted iodine 125 seeds were identified by the physics staff for projection of radiation distribution  I have requested : 3D Simulation  I have requested a DVH of the following structures: Prostate and rectum.    ________________________________  Sheral Apley Tammi Klippel, M.D.

## 2014-08-11 NOTE — Progress Notes (Signed)
Weight and vitals stable. Denies pain. Reports hot flashes are less frequency but, he is due for a hormone injection at the end of the month. Pre seed IPSS 1, post seed IPSS 22. Patient reports H/H was low and [name] is looking into him having multiple myeloma. Reports he had a bone marrow biopsy last week. Scheduled to follow up with [name] on Tuesday.  Reports nocturia is the most problematic. Patient explains he gets up on average 3-5 times per night. Denies dysuria or hematuria. Reports incomplete emptying, intermittency, and weak stream.  BP 131/64 mmHg  Pulse 58  Resp 16  Wt 171 lb 14.4 oz (77.973 kg) Wt Readings from Last 3 Encounters:  08/11/14 171 lb 14.4 oz (77.973 kg)  08/07/14 165 lb (74.844 kg)  08/03/14 171 lb 4.8 oz (77.701 kg)

## 2014-08-15 ENCOUNTER — Encounter: Payer: Self-pay | Admitting: Medical Oncology

## 2014-08-15 ENCOUNTER — Ambulatory Visit (HOSPITAL_BASED_OUTPATIENT_CLINIC_OR_DEPARTMENT_OTHER): Payer: Medicare Other | Admitting: Oncology

## 2014-08-15 ENCOUNTER — Ambulatory Visit (HOSPITAL_COMMUNITY)
Admission: RE | Admit: 2014-08-15 | Discharge: 2014-08-15 | Disposition: A | Discharge status: Home / Self Care | Payer: Medicare Other | Source: Ambulatory Visit | Attending: Oncology | Admitting: Oncology

## 2014-08-15 ENCOUNTER — Telehealth: Payer: Self-pay | Admitting: Oncology

## 2014-08-15 VITALS — BP 134/60 | HR 70 | Temp 98.8°F | Resp 18 | Ht 71.0 in | Wt 172.3 lb

## 2014-08-15 DIAGNOSIS — C9000 Multiple myeloma not having achieved remission: Secondary | ICD-10-CM | POA: Diagnosis not present

## 2014-08-15 DIAGNOSIS — C61 Malignant neoplasm of prostate: Secondary | ICD-10-CM

## 2014-08-15 LAB — CHROMOSOME ANALYSIS, BONE MARROW

## 2014-08-15 LAB — SPEP & IFE WITH QIG
ABNORMAL PROTEIN BAND2: 0.1 g/dL
ALBUMIN ELP: 3.4 g/dL — AB (ref 3.8–4.8)
Abnormal Protein Band1: 1.9 g/dL
Abnormal Protein Band3: NOT DETECTED g/dL
Alpha-1-Globulin: 0.3 g/dL (ref 0.2–0.3)
Alpha-2-Globulin: 0.6 g/dL (ref 0.5–0.9)
BETA GLOBULIN: 0.5 g/dL (ref 0.4–0.6)
Beta 2: 2.1 g/dL — ABNORMAL HIGH (ref 0.2–0.5)
Gamma Globulin: 0.3 g/dL — ABNORMAL LOW (ref 0.8–1.7)
IGA: 2220 mg/dL — AB (ref 68–379)
IGG (IMMUNOGLOBIN G), SERUM: 275 mg/dL — AB (ref 650–1600)
IGM, SERUM: 14 mg/dL — AB (ref 41–251)
Total Protein, Serum Electrophoresis: 7.2 g/dL (ref 6.1–8.1)

## 2014-08-15 LAB — KAPPA/LAMBDA LIGHT CHAINS
KAPPA LAMBDA RATIO: 0.03 — AB (ref 0.26–1.65)
Kappa free light chain: 0.81 mg/dL (ref 0.33–1.94)
Lambda Free Lght Chn: 27.1 mg/dL — ABNORMAL HIGH (ref 0.57–2.63)

## 2014-08-15 LAB — BETA 2 MICROGLOBULIN, SERUM: Beta-2 Microglobulin: 4.73 mg/L — ABNORMAL HIGH (ref <=2.51)

## 2014-08-15 NOTE — Telephone Encounter (Signed)
Patient sent to [facility] for bone survey and given avs report and appointments for July and August. Per pof patient to have imaging studies today - the only imaging order is for bone survey that should date for last week. Per patient this is the test he was to be sent for.    Not able to start new tx 7/25 as requested per 7/19 pof due to 5 day window. First treatment scheduled for 7/26 and remaining tx scheduled for the following two tuesdays. No care plan available at this time - appointments scheduled per pof. Also per pof patient to see BS 8/8 which was moved to 8/9 due to tx starting 7/26 (weekly) - patient also needs lab appointment with f/u. Both lab and f/u scheduled for 8 am and patient will come in early and be sent to lab prior to seeing BS.

## 2014-08-15 NOTE — Progress Notes (Signed)
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 He continues to have rectal urgency and urinary hesitancy. These symptoms are improving. He reports exertional dyspnea. He otherwise feels well. He underwent a bone marrow biopsy in interventional radiology on 08/07/2014. The bone marrow is involved with atypical plasma cells representing 47% of the aspirate. The plasma cells appear lambda light chain restricted consistent with a plasma cell neoplasm. A cytogenetics FISH panel is pending. No evidence of metastatic carcinoma.  Objective:  Vital signs in last 24 hours:  Blood pressure 134/60, pulse 70, temperature 98.8 F (37.1 C), temperature source Oral, resp. rate 18, height '5\' 11"'  (1.803 m), weight 172 lb 4.8 oz (78.155 kg), SpO2 99 %.    Resp: Lungs clear bilaterally Cardio: Regular rate and rhythm GI: No hepatosplenomegaly Vascular: No leg edema   Lab Results:  Lab Results  Component Value Date   WBC 2.2* 08/11/2014   HGB 8.8* 08/11/2014   HCT 27.8* 08/11/2014   MCV 95.2 08/11/2014   PLT 100* 08/11/2014   NEUTROABS 1.4* 08/11/2014   IgG 275, IgA 2220, IgM 14 Serum free lambda light chains 27.1 Beta-2 microglobulin 4.73 Serum M spike 1.9 Serum immunofixation-monoclonal IgA lambda protein   Imaging:  Dg Bone Survey Met  08/15/2014   CLINICAL DATA:  80 year old male with multiple myeloma. Query lytic lesions. Subsequent encounter.  EXAM: METASTATIC BONE SURVEY  COMPARISON:  Whole-body bone scan 02/16/2014. CT Abdomen and Pelvis 02/16/2014.  FINDINGS: Occasional small round lucent lesions in the calvarium. These could be physiologic. Otherwise calvarium bone marrow signal is within normal limits.  Bilateral cervical carotid calcified atherosclerosis. Normal prevertebral soft tissue contour. Normal cervical vertebral height and alignment. Cervical spine bone mineralization is normal for age.  Cervicothoracic junction alignment is within normal limits.  Thoracic spine vertebral height and alignment within normal limits. Flowing osteophytes intermittently noted. Thoracic spine bone mineralization within normal limits for age. Thoracic and lumbar segmentation appears normal. Mild degenerative appearing retrolisthesis in the upper lumbar spine with otherwise normal lumbar vertebral height and alignment. Lumbar bone mineralization appears normal for age.  Calcified aortic atherosclerosis. sequelae of prostate brachytherapy. Bone mineralization about the pelvis is within normal limits. Calcified atherosclerosis continues into the bilateral femoral arteries.  Bone mineralization about both femurs is within normal limits. Bilateral tib-fib bone mineralization is within normal limits for age. There are postoperative changes at the right lateral malleolus.  Bilateral shoulder and upper extremity bone mineralization is normal for age. No rib lesion is identified radiographically. Thoracic visceral contours appear within normal limits.  IMPRESSION: 1. Several small lucent lesions about the calvarium (arrows) are indeterminate and could be physiologic or due to multiple myeloma. 2. No other lucent/lytic lesion or acute osseous abnormality identified radiographically. 3. Calcified aortic, carotid, and lower extremity atherosclerosis.   Electronically Signed   By: Mitchell Lowery M.D.   On: 08/15/2014 16:41    Medications: I have reviewed the patient's current medications.  Assessment/Plan: 1. Multiple myeloma-confirmed on a bone marrow biopsy 08/07/2014, IgA lambda  Serum M spike and increased serum free lambda light chains  Bone survey 08/15/2014 with indeterminant lucent skull lesions  2. 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  He has symptomatic anemia. I discussed the diagnosis, prognosis, and systemic treatment options with him today. We discussed standard induction regimens, maintenance therapy, and stem cell therapy. I explained the benefit associated with stem cell therapy in patients who are candidates for an autologous stem cell transplant. I recommend induction therapy with RVD. I will contact the bone marrow transplant service at [facility] and we will make a referral if he is felt to be a candidate for transplant. He may be discouraged from proceeding with stem cell therapy based on his age and recent diagnosis of a Gleason 9 prostate cancer.  We reviewed the potential toxicities associated with the RVD regimen including the chance for nausea/vomiting, diarrhea, neuropathy, atypical infections, thromboembolic disease, and hematologic toxicity. We also discussed typical side effects of Decadron. He will attend a chemotherapy teaching class. He agrees to proceed with RVD.  We will follow the serum M spike and anemia as markers of response to therapy.  The plan is to begin a first cycle of RVD on 08/21/2014. 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  Approximately 40 minutes were spent with the patient today. The majority of the time was used for counseling and coordination of care.  [name], MD  08/15/2014  5:22 PM

## 2014-08-15 NOTE — Progress Notes (Signed)
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 [name]. Since he completed radiation treatments, he has been diagnoses with myeloma. He states he was fatigued and when he exercised, he felt like he was going to faint. He thought it was side effects of radiation but after labs and a bone marrow he has a new diagnosis. He states he is doing well post radiation and seed implants.I will continue to follow him for prostate cancer and I asked him and his wife to call me if help them in any way. They thanked me for following up and voiced understanding.   Patient Visit Type - - Follow-up  Barriers/Navigation Needs No barriers at this time - -  Time Spent with Patient - - 15

## 2014-08-16 ENCOUNTER — Other Ambulatory Visit: Payer: Self-pay | Admitting: *Deleted

## 2014-08-16 ENCOUNTER — Encounter: Payer: Self-pay | Admitting: Oncology

## 2014-08-16 MED ORDER — DEXAMETHASONE 4 MG PO TABS: 40.0000 mg | ORAL_TABLET | ORAL | Status: DC

## 2014-08-16 MED ORDER — LENALIDOMIDE 25 MG PO CAPS: 25.0000 mg | ORAL_CAPSULE | Freq: Every day | ORAL | Status: DC

## 2014-08-16 MED ORDER — ACYCLOVIR 400 MG PO TABS: 400.0000 mg | ORAL_TABLET | Freq: Two times a day (BID) | ORAL | Status: DC

## 2014-08-16 MED ORDER — ASPIRIN 81 MG PO TABS: 81.0000 mg | ORAL_TABLET | Freq: Every day | ORAL | Status: DC

## 2014-08-16 NOTE — Progress Notes (Signed)
Faxed revlimid prescription to [facility] °

## 2014-08-16 NOTE — Telephone Encounter (Signed)
Message left for callback to discuss new meds called into pharmacy: Decadron, Acyclovir, and Aspirin.  Revlimid script with Josem Kaufmann #[id number] taken to [facility] in [place] per her request, and placed in Raquel's desk per her instruction.

## 2014-08-17 ENCOUNTER — Other Ambulatory Visit: Payer: Medicare Other

## 2014-08-17 ENCOUNTER — Encounter: Payer: Self-pay | Admitting: *Deleted

## 2014-08-17 ENCOUNTER — Telehealth: Payer: Self-pay | Admitting: *Deleted

## 2014-08-17 ENCOUNTER — Encounter: Payer: Self-pay | Admitting: Oncology

## 2014-08-17 NOTE — Telephone Encounter (Signed)
-----   Message from Ladell Pier, MD sent at 08/17/2014  7:54 AM EDT ----- Please call patient, bone survey negative except for a few lucent areas in skull which can be normal, f/u as scheduled

## 2014-08-17 NOTE — Progress Notes (Signed)
[facility] can't fill revlimid due to insurance; faxed to [facility] @ [phone]

## 2014-08-17 NOTE — Telephone Encounter (Signed)
Bone survey results discussed w/ pt per [name]. Pt voiced understanding.

## 2014-08-18 ENCOUNTER — Encounter: Payer: Self-pay | Admitting: Oncology

## 2014-08-18 NOTE — Progress Notes (Signed)
Called [facility] about revlimid pa; approved from 08/18/14-08/18/15

## 2014-08-19 ENCOUNTER — Other Ambulatory Visit: Payer: Self-pay | Admitting: Oncology

## 2014-08-22 ENCOUNTER — Ambulatory Visit (HOSPITAL_BASED_OUTPATIENT_CLINIC_OR_DEPARTMENT_OTHER): Payer: Medicare Other

## 2014-08-22 VITALS — BP 113/61 | HR 62 | Temp 98.6°F | Resp 18

## 2014-08-22 DIAGNOSIS — Z5112 Encounter for antineoplastic immunotherapy: Secondary | ICD-10-CM

## 2014-08-22 DIAGNOSIS — C9000 Multiple myeloma not having achieved remission: Secondary | ICD-10-CM

## 2014-08-22 MED ORDER — ONDANSETRON HCL 8 MG PO TABS
8.0000 mg | ORAL_TABLET | Freq: Once | ORAL | Status: AC
  Administered 2014-08-22: 8 mg via ORAL

## 2014-08-22 MED ORDER — BORTEZOMIB CHEMO SQ INJECTION 3.5 MG (2.5MG/ML)
1.3000 mg/m2 | Freq: Once | INTRAMUSCULAR | Status: AC
  Administered 2014-08-22: 2.5 mg via SUBCUTANEOUS
  Filled 2014-08-22: qty 2.5

## 2014-08-22 MED ORDER — ONDANSETRON HCL 8 MG PO TABS
ORAL_TABLET | ORAL | Status: AC
  Filled 2014-08-22: qty 1

## 2014-08-22 NOTE — Progress Notes (Signed)
No labs needed today per [name], okay to use previous labs 1150-pt discharged ambulatory in no acute distress; tolerated 1st velcade well with no difficulty

## 2014-08-22 NOTE — Patient Instructions (Signed)
[facility] Discharge Instructions for Patients Receiving Chemotherapy  Today you received the following chemotherapy agents: Velcade  To help prevent nausea and vomiting after your treatment, we encourage you to take your nausea medication as prescribed by your physician.   If you develop nausea and vomiting that is not controlled by your nausea medication, call the clinic.   BELOW ARE SYMPTOMS THAT SHOULD BE REPORTED IMMEDIATELY:  *FEVER GREATER THAN 100.5 F  *CHILLS WITH OR WITHOUT FEVER  NAUSEA AND VOMITING THAT IS NOT CONTROLLED WITH YOUR NAUSEA MEDICATION  *UNUSUAL SHORTNESS OF BREATH  *UNUSUAL BRUISING OR BLEEDING  TENDERNESS IN MOUTH AND THROAT WITH OR WITHOUT PRESENCE OF ULCERS  *URINARY PROBLEMS  *BOWEL PROBLEMS  UNUSUAL RASH Items with * indicate a potential emergency and should be followed up as soon as possible.  Feel free to call the clinic you have any questions or concerns. The clinic phone number is [phone].  Please show the CHEMO ALERT CARD at check-in to the Emergency Department and triage nurse.  Bortezomib injection (Velcade) What is this medicine? BORTEZOMIB (bor TEZ oh mib) is a chemotherapy drug. It slows the growth of cancer cells. This medicine is used to treat multiple myeloma, and certain lymphomas, such as mantle-cell lymphoma. This medicine may be used for other purposes; ask your health care provider or pharmacist if you have questions. COMMON BRAND NAME(S): Velcade What should I tell my health care provider before I take this medicine? They need to know if you have any of these conditions: -diabetes -heart disease -irregular heartbeat -liver disease -on hemodialysis -low blood counts, like low white blood cells, platelets, or hemoglobin -peripheral neuropathy -taking medicine for blood pressure -an unusual or allergic reaction to bortezomib, mannitol, boron, other medicines, foods, dyes, or preservatives -pregnant  or trying to get pregnant -breast-feeding How should I use this medicine? This medicine is for injection into a vein or for injection under the skin. It is given by a health care professional in a hospital or clinic setting. Talk to your pediatrician regarding the use of this medicine in children. Special care may be needed. Overdosage: If you think you have taken too much of this medicine contact a poison control center or emergency room at once. NOTE: This medicine is only for you. Do not share this medicine with others. What if I miss a dose? It is important not to miss your dose. Call your doctor or health care professional if you are unable to keep an appointment. What may interact with this medicine? This medicine may interact with the following medications: -ketoconazole -rifampin -ritonavir -St. John's Wort This list may not describe all possible interactions. Give your health care provider a list of all the medicines, herbs, non-prescription drugs, or dietary supplements you use. Also tell them if you smoke, drink alcohol, or use illegal drugs. Some items may interact with your medicine. What should I watch for while using this medicine? Visit your doctor for checks on your progress. This drug may make you feel generally unwell. This is not uncommon, as chemotherapy can affect healthy cells as well as cancer cells. Report any side effects. Continue your course of treatment even though you feel ill unless your doctor tells you to stop. You may get drowsy or dizzy. Do not drive, use machinery, or do anything that needs mental alertness until you know how this medicine affects you. Do not stand or sit up quickly, especially if you are an older patient. This reduces the   risk of dizzy or fainting spells. In some cases, you may be given additional medicines to help with side effects. Follow all directions for their use. Call your doctor or health care professional for advice if you get a  fever, chills or sore throat, or other symptoms of a cold or flu. Do not treat yourself. This drug decreases your body's ability to fight infections. Try to avoid being around people who are sick. This medicine may increase your risk to bruise or bleed. Call your doctor or health care professional if you notice any unusual bleeding. You may need blood work done while you are taking this medicine. In some patients, this medicine may cause a serious brain infection that may cause death. If you have any problems seeing, thinking, speaking, walking, or standing, tell your doctor right away. If you cannot reach your doctor, urgently seek other source of medical care. Do not become pregnant while taking this medicine. Women should inform their doctor if they wish to become pregnant or think they might be pregnant. There is a potential for serious side effects to an unborn child. Talk to your health care professional or pharmacist for more information. Do not breast-feed an infant while taking this medicine. Check with your doctor or health care professional if you get an attack of severe diarrhea, nausea and vomiting, or if you sweat a lot. The loss of too much body fluid can make it dangerous for you to take this medicine. What side effects may I notice from receiving this medicine? Side effects that you should report to your doctor or health care professional as soon as possible: -allergic reactions like skin rash, itching or hives, swelling of the face, lips, or tongue -breathing problems -changes in hearing -changes in vision -fast, irregular heartbeat -feeling faint or lightheaded, falls -pain, tingling, numbness in the hands or feet -right upper belly pain -seizures -swelling of the ankles, feet, hands -unusual bleeding or bruising -unusually weak or tired -vomiting -yellowing of the eyes or skin Side effects that usually do not require medical attention (report to your doctor or health care  professional if they continue or are bothersome): -changes in emotions or moods -constipation -diarrhea -loss of appetite -headache -irritation at site where injected -nausea This list may not describe all possible side effects. Call your doctor for medical advice about side effects. You may report side effects to FDA at 1-800-FDA-1088. Where should I keep my medicine? This drug is given in a hospital or clinic and will not be stored at home. NOTE: This sheet is a summary. It may not cover all possible information. If you have questions about this medicine, talk to your doctor, pharmacist, or health care provider.  2015, Elsevier/Gold Standard. (2012-11-08 12:46:32)

## 2014-08-23 ENCOUNTER — Telehealth: Payer: Self-pay | Admitting: *Deleted

## 2014-08-23 ENCOUNTER — Other Ambulatory Visit: Payer: Self-pay | Admitting: Radiation Oncology

## 2014-08-23 LAB — TISSUE HYBRIDIZATION (BONE MARROW)-NCBH

## 2014-08-23 NOTE — Telephone Encounter (Signed)
Spoke with pt re: 1st velcade.  Pt reports he is doing well; denies fever/N/V; "slightly constipated but I'm drinking plenty of fluids"; good appetite.  Pt questioned how to take Decadron and Revlimid.  Clarification was given re: Decadron taken once a week; Revlimid take for 14 days.  Pt verbalized understanding and expressed appreciation for call.  Confirmed OV 09/05/14 and pt states he will call for any problems.

## 2014-08-23 NOTE — Progress Notes (Signed)
  Radiation Oncology         [phone] ________________________________  Name: Mitchell Lowery MRN: [id number]  Date: 08/23/2014  DOB: 1934-10-15  Chart Note:  With external radiotherapy, this patient was initially set up with the intention of delivering 3D conformal radiotherapy.  However his high risk status mandated treatment of lymph nodes.  The target volume could not be adequately covered while maintaining bowel doses at or below tolerance.  After attempting to achieve dose constraints with 3D conformal radiation, it was clear that IMRT would be medically necessary.  ________________________________  Sheral Apley Tammi Klippel, M.D.

## 2014-08-23 NOTE — Telephone Encounter (Signed)
-----   Message from Adalberto Cole, RN sent at 08/22/2014 11:52 AM EDT ----- Regarding: Dr. Burnard Hawthorne time chemo 1st velcade; tolerated well

## 2014-08-25 ENCOUNTER — Encounter (HOSPITAL_COMMUNITY): Payer: Self-pay

## 2014-08-25 DIAGNOSIS — R339 Retention of urine, unspecified: Secondary | ICD-10-CM | POA: Diagnosis not present

## 2014-08-25 DIAGNOSIS — N400 Enlarged prostate without lower urinary tract symptoms: Secondary | ICD-10-CM | POA: Diagnosis not present

## 2014-08-25 DIAGNOSIS — C61 Malignant neoplasm of prostate: Secondary | ICD-10-CM | POA: Diagnosis not present

## 2014-08-27 ENCOUNTER — Other Ambulatory Visit: Payer: Self-pay | Admitting: Oncology

## 2014-08-29 ENCOUNTER — Ambulatory Visit (HOSPITAL_BASED_OUTPATIENT_CLINIC_OR_DEPARTMENT_OTHER): Payer: Medicare Other

## 2014-08-29 ENCOUNTER — Encounter (HOSPITAL_COMMUNITY): Payer: Self-pay

## 2014-08-29 ENCOUNTER — Other Ambulatory Visit (HOSPITAL_BASED_OUTPATIENT_CLINIC_OR_DEPARTMENT_OTHER): Payer: Medicare Other

## 2014-08-29 VITALS — BP 116/64 | HR 76 | Temp 98.5°F | Resp 18

## 2014-08-29 DIAGNOSIS — C9000 Multiple myeloma not having achieved remission: Secondary | ICD-10-CM | POA: Diagnosis present

## 2014-08-29 DIAGNOSIS — Z5112 Encounter for antineoplastic immunotherapy: Secondary | ICD-10-CM

## 2014-08-29 DIAGNOSIS — C61 Malignant neoplasm of prostate: Secondary | ICD-10-CM

## 2014-08-29 LAB — CBC WITH DIFFERENTIAL/PLATELET
BASO%: 0.1 % (ref 0.0–2.0)
BASOS ABS: 0 10*3/uL (ref 0.0–0.1)
EOS ABS: 0.1 10*3/uL (ref 0.0–0.5)
EOS%: 2.3 % (ref 0.0–7.0)
HCT: 28.1 % — ABNORMAL LOW (ref 38.4–49.9)
HEMOGLOBIN: 9.1 g/dL — AB (ref 13.0–17.1)
LYMPH%: 10.9 % — ABNORMAL LOW (ref 14.0–49.0)
MCH: 30.4 pg (ref 27.2–33.4)
MCHC: 32.4 g/dL (ref 32.0–36.0)
MCV: 93.7 fL (ref 79.3–98.0)
MONO#: 0.2 10*3/uL (ref 0.1–0.9)
MONO%: 4 % (ref 0.0–14.0)
NEUT#: 3.2 10*3/uL (ref 1.5–6.5)
NEUT%: 82.7 % — ABNORMAL HIGH (ref 39.0–75.0)
PLATELETS: 106 10*3/uL — AB (ref 140–400)
RBC: 2.99 10*6/uL — ABNORMAL LOW (ref 4.20–5.82)
RDW: 18.2 % — ABNORMAL HIGH (ref 11.0–14.6)
WBC: 3.9 10*3/uL — AB (ref 4.0–10.3)
lymph#: 0.4 10*3/uL — ABNORMAL LOW (ref 0.9–3.3)

## 2014-08-29 MED ORDER — ONDANSETRON HCL 8 MG PO TABS
ORAL_TABLET | ORAL | Status: AC
  Filled 2014-08-29: qty 1

## 2014-08-29 MED ORDER — ONDANSETRON HCL 8 MG PO TABS
8.0000 mg | ORAL_TABLET | Freq: Once | ORAL | Status: AC
  Administered 2014-08-29: 8 mg via ORAL

## 2014-08-29 MED ORDER — BORTEZOMIB CHEMO SQ INJECTION 3.5 MG (2.5MG/ML)
1.3000 mg/m2 | Freq: Once | INTRAMUSCULAR | Status: AC
  Administered 2014-08-29: 2.5 mg via SUBCUTANEOUS
  Filled 2014-08-29: qty 2.5

## 2014-08-29 NOTE — Patient Instructions (Signed)
[facility] Discharge Instructions for Patients Receiving Chemotherapy  Today you received the following chemotherapy agents: Velcade  To help prevent nausea and vomiting after your treatment, we encourage you to take your nausea medication as prescribed by your physician.   If you develop nausea and vomiting that is not controlled by your nausea medication, call the clinic.   BELOW ARE SYMPTOMS THAT SHOULD BE REPORTED IMMEDIATELY:  *FEVER GREATER THAN 100.5 F  *CHILLS WITH OR WITHOUT FEVER  NAUSEA AND VOMITING THAT IS NOT CONTROLLED WITH YOUR NAUSEA MEDICATION  *UNUSUAL SHORTNESS OF BREATH  *UNUSUAL BRUISING OR BLEEDING  TENDERNESS IN MOUTH AND THROAT WITH OR WITHOUT PRESENCE OF ULCERS  *URINARY PROBLEMS  *BOWEL PROBLEMS  UNUSUAL RASH Items with * indicate a potential emergency and should be followed up as soon as possible.  Feel free to call the clinic you have any questions or concerns. The clinic phone number is [phone].  Please show the [place] at check-in to the Emergency Department and triage nurse.  Bortezomib injection (Velcade) What is this medicine? BORTEZOMIB (bor TEZ oh mib) is a chemotherapy drug. It slows the growth of cancer cells. This medicine is used to treat multiple myeloma, and certain lymphomas, such as mantle-cell lymphoma. This medicine may be used for other purposes; ask your health care provider or pharmacist if you have questions. COMMON BRAND NAME(S): Velcade What should I tell my health care provider before I take this medicine? They need to know if you have any of these conditions: -diabetes -heart disease -irregular heartbeat -liver disease -on hemodialysis -low blood counts, like low white blood cells, platelets, or hemoglobin -peripheral neuropathy -taking medicine for blood pressure -an unusual or allergic reaction to bortezomib, mannitol, boron, other medicines, foods, dyes, or preservatives -pregnant  or trying to get pregnant -breast-feeding How should I use this medicine? This medicine is for injection into a vein or for injection under the skin. It is given by a health care professional in a hospital or clinic setting. Talk to your pediatrician regarding the use of this medicine in children. Special care may be needed. Overdosage: If you think you have taken too much of this medicine contact a poison control center or emergency room at once. NOTE: This medicine is only for you. Do not share this medicine with others. What if I miss a dose? It is important not to miss your dose. Call your doctor or health care professional if you are unable to keep an appointment. What may interact with this medicine? This medicine may interact with the following medications: -ketoconazole -rifampin -ritonavir -St. John's Wort This list may not describe all possible interactions. Give your health care provider a list of all the medicines, herbs, non-prescription drugs, or dietary supplements you use. Also tell them if you smoke, drink alcohol, or use illegal drugs. Some items may interact with your medicine. What should I watch for while using this medicine? Visit your doctor for checks on your progress. This drug may make you feel generally unwell. This is not uncommon, as chemotherapy can affect healthy cells as well as cancer cells. Report any side effects. Continue your course of treatment even though you feel ill unless your doctor tells you to stop. You may get drowsy or dizzy. Do not drive, use machinery, or do anything that needs mental alertness until you know how this medicine affects you. Do not stand or sit up quickly, especially if you are an older patient. This reduces the  risk of dizzy or fainting spells. In some cases, you may be given additional medicines to help with side effects. Follow all directions for their use. Call your doctor or health care professional for advice if you get a  fever, chills or sore throat, or other symptoms of a cold or flu. Do not treat yourself. This drug decreases your body's ability to fight infections. Try to avoid being around people who are sick. This medicine may increase your risk to bruise or bleed. Call your doctor or health care professional if you notice any unusual bleeding. You may need blood work done while you are taking this medicine. In some patients, this medicine may cause a serious brain infection that may cause death. If you have any problems seeing, thinking, speaking, walking, or standing, tell your doctor right away. If you cannot reach your doctor, urgently seek other source of medical care. Do not become pregnant while taking this medicine. Women should inform their doctor if they wish to become pregnant or think they might be pregnant. There is a potential for serious side effects to an unborn child. Talk to your health care professional or pharmacist for more information. Do not breast-feed an infant while taking this medicine. Check with your doctor or health care professional if you get an attack of severe diarrhea, nausea and vomiting, or if you sweat a lot. The loss of too much body fluid can make it dangerous for you to take this medicine. What side effects may I notice from receiving this medicine? Side effects that you should report to your doctor or health care professional as soon as possible: -allergic reactions like skin rash, itching or hives, swelling of the face, lips, or tongue -breathing problems -changes in hearing -changes in vision -fast, irregular heartbeat -feeling faint or lightheaded, falls -pain, tingling, numbness in the hands or feet -right upper belly pain -seizures -swelling of the ankles, feet, hands -unusual bleeding or bruising -unusually weak or tired -vomiting -yellowing of the eyes or skin Side effects that usually do not require medical attention (report to your doctor or health care  professional if they continue or are bothersome): -changes in emotions or moods -constipation -diarrhea -loss of appetite -headache -irritation at site where injected -nausea This list may not describe all possible side effects. Call your doctor for medical advice about side effects. You may report side effects to FDA at 1-800-FDA-1088. Where should I keep my medicine? This drug is given in a hospital or clinic and will not be stored at home. NOTE: This sheet is a summary. It may not cover all possible information. If you have questions about this medicine, talk to your doctor, pharmacist, or health care provider.  2015, Elsevier/Gold Standard. (2012-11-08 12:46:32)

## 2014-09-02 ENCOUNTER — Other Ambulatory Visit: Payer: Self-pay | Admitting: Oncology

## 2014-09-05 ENCOUNTER — Telehealth: Payer: Self-pay | Admitting: *Deleted

## 2014-09-05 ENCOUNTER — Ambulatory Visit (HOSPITAL_BASED_OUTPATIENT_CLINIC_OR_DEPARTMENT_OTHER): Payer: Medicare Other | Admitting: Oncology

## 2014-09-05 ENCOUNTER — Telehealth: Payer: Self-pay | Admitting: Oncology

## 2014-09-05 ENCOUNTER — Ambulatory Visit (HOSPITAL_BASED_OUTPATIENT_CLINIC_OR_DEPARTMENT_OTHER): Payer: Medicare Other

## 2014-09-05 ENCOUNTER — Ambulatory Visit (HOSPITAL_BASED_OUTPATIENT_CLINIC_OR_DEPARTMENT_OTHER): Payer: Medicare Other | Admitting: Lab

## 2014-09-05 VITALS — BP 124/46 | HR 61 | Temp 98.8°F | Resp 18 | Ht 71.0 in | Wt 176.0 lb

## 2014-09-05 DIAGNOSIS — C61 Malignant neoplasm of prostate: Secondary | ICD-10-CM

## 2014-09-05 DIAGNOSIS — Z5112 Encounter for antineoplastic immunotherapy: Secondary | ICD-10-CM | POA: Diagnosis present

## 2014-09-05 DIAGNOSIS — C9000 Multiple myeloma not having achieved remission: Secondary | ICD-10-CM

## 2014-09-05 LAB — COMPREHENSIVE METABOLIC PANEL (CC13)
ALK PHOS: 41 U/L (ref 40–150)
ALT: 17 U/L (ref 0–55)
AST: 15 U/L (ref 5–34)
Albumin: 3.3 g/dL — ABNORMAL LOW (ref 3.5–5.0)
Anion Gap: 7 mEq/L (ref 3–11)
BILIRUBIN TOTAL: 0.47 mg/dL (ref 0.20–1.20)
BUN: 10.3 mg/dL (ref 7.0–26.0)
CO2: 28 mEq/L (ref 22–29)
Calcium: 8.6 mg/dL (ref 8.4–10.4)
Chloride: 109 mEq/L (ref 98–109)
Creatinine: 0.8 mg/dL (ref 0.7–1.3)
EGFR: 88 mL/min/{1.73_m2} — ABNORMAL LOW (ref >90)
GLUCOSE: 85 mg/dL (ref 70–140)
Potassium: 3.6 mEq/L (ref 3.5–5.1)
SODIUM: 144 meq/L (ref 136–145)
TOTAL PROTEIN: 6.2 g/dL — AB (ref 6.4–8.3)

## 2014-09-05 LAB — CBC WITH DIFFERENTIAL/PLATELET
BASO%: 0.2 % (ref 0.0–2.0)
Basophils Absolute: 0 10*3/uL (ref 0.0–0.1)
EOS%: 5.3 % (ref 0.0–7.0)
Eosinophils Absolute: 0.2 10*3/uL (ref 0.0–0.5)
HCT: 29.2 % — ABNORMAL LOW (ref 38.4–49.9)
HGB: 9.5 g/dL — ABNORMAL LOW (ref 13.0–17.1)
LYMPH%: 13.5 % — ABNORMAL LOW (ref 14.0–49.0)
MCH: 30.7 pg (ref 27.2–33.4)
MCHC: 32.7 g/dL (ref 32.0–36.0)
MCV: 93.7 fL (ref 79.3–98.0)
MONO#: 0.4 10*3/uL (ref 0.1–0.9)
MONO%: 12.6 % (ref 0.0–14.0)
NEUT#: 2 10*3/uL (ref 1.5–6.5)
NEUT%: 68.4 % (ref 39.0–75.0)
Platelets: 95 10*3/uL — ABNORMAL LOW (ref 140–400)
RBC: 3.11 10*6/uL — ABNORMAL LOW (ref 4.20–5.82)
RDW: 17.9 % — ABNORMAL HIGH (ref 11.0–14.6)
WBC: 2.9 10*3/uL — AB (ref 4.0–10.3)
lymph#: 0.4 10*3/uL — ABNORMAL LOW (ref 0.9–3.3)

## 2014-09-05 MED ORDER — ONDANSETRON HCL 8 MG PO TABS
ORAL_TABLET | ORAL | Status: AC
  Filled 2014-09-05: qty 1

## 2014-09-05 MED ORDER — ONDANSETRON HCL 8 MG PO TABS
8.0000 mg | ORAL_TABLET | Freq: Once | ORAL | Status: AC
  Administered 2014-09-05: 8 mg via ORAL

## 2014-09-05 MED ORDER — BORTEZOMIB CHEMO SQ INJECTION 3.5 MG (2.5MG/ML)
1.3000 mg/m2 | Freq: Once | INTRAMUSCULAR | Status: AC
  Administered 2014-09-05: 2.5 mg via SUBCUTANEOUS
  Filled 2014-09-05: qty 2.5

## 2014-09-05 NOTE — Progress Notes (Signed)
  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 He began a first cycle of systemic therapy on 08/22/2014. He reports tolerating the therapy well. He has insomnia on the night of Decadron therapy. No neuropathy symptoms or nausea. He continues to have urinary hesitancy and rectal urgency. He has not resumed exercising. Objective:  Vital signs in last 24 hours:  Blood pressure 124/46, pulse 61, temperature 98.8 F (37.1 C), temperature source Oral, resp. rate 18, height _0  (1.803 m), weight 176 lb (79.833 kg), SpO2 99 %.    HEENT: No thrush or ulcers Resp: Lungs clear bilaterally Cardio: Regular rate and rhythm GI: No hepatosplenomegaly, nontender Vascular: No leg edema  Lab Results:  Lab Results  Component Value Date   WBC 2.9* 09/05/2014   HGB 9.5* 09/05/2014   HCT 29.2* 09/05/2014   MCV 93.7 09/05/2014   PLT 95* 09/05/2014   NEUTROABS 2.0 09/05/2014     Medications: I have reviewed the patient's current medications.  Assessment/Plan: 1. Multiple myeloma-confirmed on a bone marrow biopsy 08/07/2014, IgA lambda  Serum M spike and increased serum free lambda light chains  Myeloma FISH panel negative for chromosome 4, 11, 12, 13, 14, and 17 abnormalities, cytogenetics with no metaphases  Bone survey 08/15/2014 with indeterminant lucent skull lesions  Cycle 1 RVD 08/22/2014  2. 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 He appears to be tolerating the systemic therapy well. He will begin a second cycle of RVD on 09/12/2014. He will return for an office visit and repeat IgA level 09/26/2014  We discussed the indication for  autologous stem cell therapy. I explained data confirming a disease-free survival benefit with stem cell therapy. I discussed his case with the transplant service at [facility]. He is a potential transplant candidate despite the recent diagnosis of prostate cancer. [name] does not wish to consider stem cell therapy at present. We will discuss this again after several months of systemic therapy.  [name], MD  09/05/2014  4:50 PM

## 2014-09-05 NOTE — Progress Notes (Signed)
Per [name], okay to tx with platelets 95.

## 2014-09-05 NOTE — Telephone Encounter (Signed)
per pof to sch pt appt-gave pt copy of avs-sent MW email to sch trmt-pt aware °

## 2014-09-05 NOTE — Patient Instructions (Signed)
[facility] Discharge Instructions for Patients Receiving Chemotherapy  Today you received the following chemotherapy agents: Velcade  To help prevent nausea and vomiting after your treatment, we encourage you to take your nausea medication as prescribed by your physician.   If you develop nausea and vomiting that is not controlled by your nausea medication, call the clinic.   BELOW ARE SYMPTOMS THAT SHOULD BE REPORTED IMMEDIATELY:  *FEVER GREATER THAN 100.5 F  *CHILLS WITH OR WITHOUT FEVER  NAUSEA AND VOMITING THAT IS NOT CONTROLLED WITH YOUR NAUSEA MEDICATION  *UNUSUAL SHORTNESS OF BREATH  *UNUSUAL BRUISING OR BLEEDING  TENDERNESS IN MOUTH AND THROAT WITH OR WITHOUT PRESENCE OF ULCERS  *URINARY PROBLEMS  *BOWEL PROBLEMS  UNUSUAL RASH Items with * indicate a potential emergency and should be followed up as soon as possible.  Feel free to call the clinic you have any questions or concerns. The clinic phone number is [phone].  Please show the CHEMO ALERT CARD at check-in to the Emergency Department and triage nurse.   

## 2014-09-05 NOTE — Telephone Encounter (Signed)
Per staff message and POF I have scheduled appts. Advised scheduler of appts and to move/add labs  JMW

## 2014-09-06 ENCOUNTER — Other Ambulatory Visit: Payer: Self-pay | Admitting: Oncology

## 2014-09-06 ENCOUNTER — Telehealth: Payer: Self-pay | Admitting: Oncology

## 2014-09-06 NOTE — Telephone Encounter (Signed)
per reply from MW to move labs for 8/16 appt-cld & left pt a message to adv of time & date-pt understood

## 2014-09-07 ENCOUNTER — Telehealth: Payer: Self-pay | Admitting: *Deleted

## 2014-09-07 NOTE — Telephone Encounter (Signed)
Call from pt reporting he contacted his pharmacy and was told there was no auth # on his Revlimid Rx. Called [facility] @ [phone], spoke with Estill Bamberg she reports she sees authorization, she will contact pt now to arrange delivery.

## 2014-09-07 NOTE — Telephone Encounter (Signed)
TC from patient this morning regarding his refill on his Revlimid. He called [facility] this am and they told him they had not received prescription. Reviewed medication refills and Dr. Gearldine Shown nurse refilled this prescription yesterday and received confirmation from [facility] regarding receipt of prescription @ 5:19 pm 09/06/14. Mitchell Lowery will call back to [facility].

## 2014-09-08 ENCOUNTER — Ambulatory Visit (HOSPITAL_BASED_OUTPATIENT_CLINIC_OR_DEPARTMENT_OTHER): Payer: Medicare Other

## 2014-09-08 ENCOUNTER — Telehealth: Payer: Self-pay | Admitting: *Deleted

## 2014-09-08 ENCOUNTER — Telehealth: Payer: Self-pay

## 2014-09-08 DIAGNOSIS — C9000 Multiple myeloma not having achieved remission: Secondary | ICD-10-CM

## 2014-09-08 DIAGNOSIS — C61 Malignant neoplasm of prostate: Secondary | ICD-10-CM | POA: Diagnosis not present

## 2014-09-08 DIAGNOSIS — R300 Dysuria: Secondary | ICD-10-CM

## 2014-09-08 LAB — URINALYSIS, MICROSCOPIC - CHCC
BACTERIA UA: NEGATIVE
BLOOD: NEGATIVE
Bilirubin (Urine): NEGATIVE
GLUCOSE UR CHCC: NEGATIVE mg/dL
KETONES: NEGATIVE mg/dL
LEUKOCYTE ESTERASE: NEGATIVE
Nitrite: NEGATIVE
PH: 7 (ref 4.6–8.0)
Protein: NEGATIVE mg/dL
RBC / HPF: NEGATIVE (ref 0–2)
SPECIFIC GRAVITY, URINE: 1.01 (ref 1.003–1.035)
Urobilinogen, UR: 0.2 mg/dL (ref 0.2–1)

## 2014-09-08 NOTE — Telephone Encounter (Signed)
-----   Message from Ladell Pier, MD sent at 09/08/2014  4:39 PM EDT ----- Please call patient, urine  Is negative, f/u with urology for persistent urinary symptoms, likely due to radiation.  Call for fever greater than 101

## 2014-09-08 NOTE — Telephone Encounter (Signed)
Urinary problem. Has hx of prostatis and UTI's. Hx is antibiotics to clear it up. Has incomplete emptying. Constipation followed by diarrhea each cycle of velcade.

## 2014-09-08 NOTE — Telephone Encounter (Signed)
Per [name]; notified pt that urine is negative, f/u with urology for persistent urinary symptoms, likely due to radiation; call for fever greater than 101.  Pt verbalized understanding and expressed appreciation for call.

## 2014-09-08 NOTE — Telephone Encounter (Signed)
Pt called with mild fever 99.5, diarrhea and problems urinating.

## 2014-09-10 ENCOUNTER — Other Ambulatory Visit: Payer: Self-pay | Admitting: Oncology

## 2014-09-11 LAB — URINE CULTURE

## 2014-09-12 ENCOUNTER — Other Ambulatory Visit: Payer: Medicare Other

## 2014-09-12 ENCOUNTER — Other Ambulatory Visit (HOSPITAL_BASED_OUTPATIENT_CLINIC_OR_DEPARTMENT_OTHER): Payer: Medicare Other

## 2014-09-12 ENCOUNTER — Ambulatory Visit: Payer: Medicare Other

## 2014-09-12 ENCOUNTER — Telehealth: Payer: Self-pay | Admitting: *Deleted

## 2014-09-12 DIAGNOSIS — C61 Malignant neoplasm of prostate: Secondary | ICD-10-CM

## 2014-09-12 DIAGNOSIS — C9000 Multiple myeloma not having achieved remission: Secondary | ICD-10-CM | POA: Diagnosis present

## 2014-09-12 LAB — CBC WITH DIFFERENTIAL/PLATELET
BASO%: 1 % (ref 0.0–2.0)
Basophils Absolute: 0 10*3/uL (ref 0.0–0.1)
EOS ABS: 0.1 10*3/uL (ref 0.0–0.5)
EOS%: 6.6 % (ref 0.0–7.0)
HCT: 30.4 % — ABNORMAL LOW (ref 38.4–49.9)
HGB: 9.9 g/dL — ABNORMAL LOW (ref 13.0–17.1)
LYMPH#: 0.2 10*3/uL — AB (ref 0.9–3.3)
LYMPH%: 15.7 % (ref 14.0–49.0)
MCH: 30.2 pg (ref 27.2–33.4)
MCHC: 32.5 g/dL (ref 32.0–36.0)
MCV: 92.9 fL (ref 79.3–98.0)
MONO#: 0.3 10*3/uL (ref 0.1–0.9)
MONO%: 18.8 % — AB (ref 0.0–14.0)
NEUT#: 0.9 10*3/uL — ABNORMAL LOW (ref 1.5–6.5)
NEUT%: 57.9 % (ref 39.0–75.0)
PLATELETS: 111 10*3/uL — AB (ref 140–400)
RBC: 3.27 10*6/uL — ABNORMAL LOW (ref 4.20–5.82)
RDW: 17.9 % — ABNORMAL HIGH (ref 11.0–14.6)
WBC: 1.5 10*3/uL — AB (ref 4.0–10.3)

## 2014-09-12 NOTE — Progress Notes (Signed)
Hold Velcade today per [name].  ANC-0.9 and WBC-1.5.  Per [name], pt is to also hold Revlimid for this week  and continue Decadron as ordered per MD.  Pt is to notify MD with any temperature of 100.5 or greater.  Pt instructed of all of the above information and has no questions at this time.  Pt verbalizes an understanding to return to [facility] as scheduled next week for lab and infusion appt.

## 2014-09-12 NOTE — Telephone Encounter (Signed)
Called pt to let him know of neg urine culture results; voiced understanding and appreciated call.

## 2014-09-12 NOTE — Telephone Encounter (Signed)
-----   Message from Ladell Pier, MD sent at 09/11/2014  6:26 PM EDT ----- Please call patient, urine culture is negative

## 2014-09-17 ENCOUNTER — Other Ambulatory Visit: Payer: Self-pay | Admitting: Oncology

## 2014-09-19 ENCOUNTER — Other Ambulatory Visit (HOSPITAL_BASED_OUTPATIENT_CLINIC_OR_DEPARTMENT_OTHER): Payer: Medicare Other

## 2014-09-19 ENCOUNTER — Ambulatory Visit (HOSPITAL_BASED_OUTPATIENT_CLINIC_OR_DEPARTMENT_OTHER): Payer: Medicare Other

## 2014-09-19 VITALS — BP 132/66 | HR 60 | Temp 97.6°F | Resp 18

## 2014-09-19 DIAGNOSIS — C61 Malignant neoplasm of prostate: Secondary | ICD-10-CM | POA: Diagnosis not present

## 2014-09-19 DIAGNOSIS — C9000 Multiple myeloma not having achieved remission: Secondary | ICD-10-CM | POA: Diagnosis present

## 2014-09-19 DIAGNOSIS — Z5112 Encounter for antineoplastic immunotherapy: Secondary | ICD-10-CM

## 2014-09-19 LAB — CBC WITH DIFFERENTIAL/PLATELET
BASO%: 0.9 % (ref 0.0–2.0)
Basophils Absolute: 0 10*3/uL (ref 0.0–0.1)
EOS%: 2.9 % (ref 0.0–7.0)
Eosinophils Absolute: 0.1 10*3/uL (ref 0.0–0.5)
HCT: 32.6 % — ABNORMAL LOW (ref 38.4–49.9)
HEMOGLOBIN: 10.7 g/dL — AB (ref 13.0–17.1)
LYMPH%: 7.4 % — AB (ref 14.0–49.0)
MCH: 29.8 pg (ref 27.2–33.4)
MCHC: 32.8 g/dL (ref 32.0–36.0)
MCV: 90.6 fL (ref 79.3–98.0)
MONO#: 0.2 10*3/uL (ref 0.1–0.9)
MONO%: 5.4 % (ref 0.0–14.0)
NEUT%: 83.4 % — ABNORMAL HIGH (ref 39.0–75.0)
NEUTROS ABS: 2.4 10*3/uL (ref 1.5–6.5)
PLATELETS: 162 10*3/uL (ref 140–400)
RBC: 3.59 10*6/uL — AB (ref 4.20–5.82)
RDW: 17.6 % — ABNORMAL HIGH (ref 11.0–14.6)
WBC: 2.9 10*3/uL — AB (ref 4.0–10.3)
lymph#: 0.2 10*3/uL — ABNORMAL LOW (ref 0.9–3.3)

## 2014-09-19 MED ORDER — ONDANSETRON HCL 8 MG PO TABS
ORAL_TABLET | ORAL | Status: AC
  Filled 2014-09-19: qty 1

## 2014-09-19 MED ORDER — BORTEZOMIB CHEMO SQ INJECTION 3.5 MG (2.5MG/ML)
1.3000 mg/m2 | Freq: Once | INTRAMUSCULAR | Status: AC
  Administered 2014-09-19: 2.5 mg via SUBCUTANEOUS
  Filled 2014-09-19: qty 2.5

## 2014-09-19 MED ORDER — ONDANSETRON HCL 8 MG PO TABS
8.0000 mg | ORAL_TABLET | Freq: Once | ORAL | Status: AC
  Administered 2014-09-19: 8 mg via ORAL

## 2014-09-19 NOTE — Patient Instructions (Signed)
[facility] Discharge Instructions for Patients Receiving Chemotherapy  Today you received the following chemotherapy agents: Velcade  To help prevent nausea and vomiting after your treatment, we encourage you to take your nausea medication as prescribed by your physician.   If you develop nausea and vomiting that is not controlled by your nausea medication, call the clinic.   BELOW ARE SYMPTOMS THAT SHOULD BE REPORTED IMMEDIATELY:  *FEVER GREATER THAN 100.5 F  *CHILLS WITH OR WITHOUT FEVER  NAUSEA AND VOMITING THAT IS NOT CONTROLLED WITH YOUR NAUSEA MEDICATION  *UNUSUAL SHORTNESS OF BREATH  *UNUSUAL BRUISING OR BLEEDING  TENDERNESS IN MOUTH AND THROAT WITH OR WITHOUT PRESENCE OF ULCERS  *URINARY PROBLEMS  *BOWEL PROBLEMS  UNUSUAL RASH Items with * indicate a potential emergency and should be followed up as soon as possible.  Feel free to call the clinic you have any questions or concerns. The clinic phone number is [phone].  Please show the CHEMO ALERT CARD at check-in to the Emergency Department and triage nurse.   

## 2014-09-19 NOTE — Progress Notes (Signed)
OK per [name] to proceed with treatment from [facility] 09/05/14.

## 2014-09-23 ENCOUNTER — Other Ambulatory Visit: Payer: Self-pay | Admitting: Oncology

## 2014-09-26 ENCOUNTER — Ambulatory Visit (HOSPITAL_BASED_OUTPATIENT_CLINIC_OR_DEPARTMENT_OTHER): Payer: Medicare Other | Admitting: Oncology

## 2014-09-26 ENCOUNTER — Other Ambulatory Visit (HOSPITAL_BASED_OUTPATIENT_CLINIC_OR_DEPARTMENT_OTHER): Payer: Medicare Other

## 2014-09-26 ENCOUNTER — Ambulatory Visit (HOSPITAL_BASED_OUTPATIENT_CLINIC_OR_DEPARTMENT_OTHER): Payer: Medicare Other

## 2014-09-26 VITALS — BP 139/56 | HR 55 | Temp 98.5°F | Resp 20 | Ht 71.0 in | Wt 180.1 lb

## 2014-09-26 DIAGNOSIS — C9000 Multiple myeloma not having achieved remission: Secondary | ICD-10-CM | POA: Diagnosis not present

## 2014-09-26 DIAGNOSIS — C61 Malignant neoplasm of prostate: Secondary | ICD-10-CM

## 2014-09-26 DIAGNOSIS — Z5112 Encounter for antineoplastic immunotherapy: Secondary | ICD-10-CM

## 2014-09-26 LAB — CBC WITH DIFFERENTIAL/PLATELET
BASO%: 0.4 % (ref 0.0–2.0)
Basophils Absolute: 0 10*3/uL (ref 0.0–0.1)
EOS%: 5 % (ref 0.0–7.0)
Eosinophils Absolute: 0.2 10*3/uL (ref 0.0–0.5)
HEMATOCRIT: 34.6 % — AB (ref 38.4–49.9)
HGB: 11.1 g/dL — ABNORMAL LOW (ref 13.0–17.1)
LYMPH%: 6.8 % — ABNORMAL LOW (ref 14.0–49.0)
MCH: 29.1 pg (ref 27.2–33.4)
MCHC: 32 g/dL (ref 32.0–36.0)
MCV: 90.6 fL (ref 79.3–98.0)
MONO#: 0.2 10*3/uL (ref 0.1–0.9)
MONO%: 5.8 % (ref 0.0–14.0)
NEUT#: 2.9 10*3/uL (ref 1.5–6.5)
NEUT%: 82 % — ABNORMAL HIGH (ref 39.0–75.0)
Platelets: 113 10*3/uL — ABNORMAL LOW (ref 140–400)
RBC: 3.82 10*6/uL — ABNORMAL LOW (ref 4.20–5.82)
RDW: 17.2 % — AB (ref 11.0–14.6)
WBC: 3.5 10*3/uL — AB (ref 4.0–10.3)
lymph#: 0.2 10*3/uL — ABNORMAL LOW (ref 0.9–3.3)

## 2014-09-26 LAB — COMPREHENSIVE METABOLIC PANEL (CC13)
ALBUMIN: 3.4 g/dL — AB (ref 3.5–5.0)
ALK PHOS: 58 U/L (ref 40–150)
ALT: 19 U/L (ref 0–55)
AST: 16 U/L (ref 5–34)
Anion Gap: 8 mEq/L (ref 3–11)
BILIRUBIN TOTAL: 0.53 mg/dL (ref 0.20–1.20)
BUN: 8 mg/dL (ref 7.0–26.0)
CO2: 29 mEq/L (ref 22–29)
CREATININE: 0.7 mg/dL (ref 0.7–1.3)
Calcium: 8.9 mg/dL (ref 8.4–10.4)
Chloride: 107 mEq/L (ref 98–109)
EGFR: 89 mL/min/{1.73_m2} — ABNORMAL LOW (ref >90)
GLUCOSE: 87 mg/dL (ref 70–140)
POTASSIUM: 3.6 meq/L (ref 3.5–5.1)
SODIUM: 144 meq/L (ref 136–145)
TOTAL PROTEIN: 5.9 g/dL — AB (ref 6.4–8.3)

## 2014-09-26 MED ORDER — BORTEZOMIB CHEMO SQ INJECTION 3.5 MG (2.5MG/ML)
1.3000 mg/m2 | Freq: Once | INTRAMUSCULAR | Status: AC
  Administered 2014-09-26: 2.5 mg via SUBCUTANEOUS
  Filled 2014-09-26: qty 2.5

## 2014-09-26 MED ORDER — ONDANSETRON HCL 8 MG PO TABS
ORAL_TABLET | ORAL | Status: AC
  Filled 2014-09-26: qty 1

## 2014-09-26 MED ORDER — ONDANSETRON HCL 8 MG PO TABS
8.0000 mg | ORAL_TABLET | Freq: Once | ORAL | Status: AC
  Administered 2014-09-26: 8 mg via ORAL

## 2014-09-26 NOTE — Progress Notes (Signed)
  [facility] OFFICE PROGRESS NOTE   Diagnosis: Multiple myeloma  INTERVAL HISTORY:   Mr. Smethurst returns as scheduled. Revlimid and Velcade were held for one week and he began another cycle on 09/19/2014. No neuropathy symptoms. No nausea/vomiting. He has noted an improvement in his energy level. He was able to swim yesterday. He continues to have urinary urgency and alternating diarrhea/constipation. He is scheduled to see Dr. Diona Fanti later this week. He relates weight gain and abdominal fat gain to taking Decadron. Objective:  Vital signs in last 24 hours:  Blood pressure 139/56, pulse 55, temperature 98.5 F (36.9 C), temperature source Oral, resp. rate 20, height $RemoveBe'5\' 11"'kAmQUOkzy$  (1.803 m), weight 180 lb 1.6 oz (81.693 kg), SpO2 99 %.    HEENT: No thrush or ulcers, mild cushingoid change of the face Resp: Lungs clear bilaterally Cardio: Regular rate and rhythm GI: No hepatomegaly, nontender Vascular: No leg edema     Lab Results:  Lab Results  Component Value Date   WBC 3.5* 09/26/2014   HGB 11.1* 09/26/2014   HCT 34.6* 09/26/2014   MCV 90.6 09/26/2014   PLT 113* 09/26/2014   NEUTROABS 2.9 09/26/2014    Medications: I have reviewed the patient's current medications.  Assessment/Plan: 1. Multiple myeloma-confirmed on a bone marrow biopsy 08/07/2014, IgA lambda  Serum M spike and increased serum free lambda light chains  Myeloma FISH panel negative for chromosome 4, 11, 12, 13, 14, and 17 abnormalities, cytogenetics with no metaphases  Bone survey 08/15/2014 with indeterminant lucent skull lesions  Cycle 1 RVD 08/22/2014  Cycle 2 RVD 09/19/2014  2. 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 The anemia has improved and he appears to be tolerating the treatment well. He will complete the current cycle of therapy over the next 2 weeks with the plan to begin cycle 3 on 10/10/2014. He will be scheduled for an office visit 10/24/2014.  We will follow-up on the IgA level today.  I suspect the urinary and rectal symptoms are related to the prostate radiation. He will see Dr. Diona Fanti within the next few days.  [name], MD  09/26/2014  2:16 PM

## 2014-09-27 ENCOUNTER — Other Ambulatory Visit: Payer: Self-pay | Admitting: *Deleted

## 2014-09-27 DIAGNOSIS — N400 Enlarged prostate without lower urinary tract symptoms: Secondary | ICD-10-CM | POA: Diagnosis not present

## 2014-09-27 DIAGNOSIS — R339 Retention of urine, unspecified: Secondary | ICD-10-CM | POA: Diagnosis not present

## 2014-09-27 LAB — KAPPA/LAMBDA LIGHT CHAINS
Kappa free light chain: 1.23 mg/dL (ref 0.33–1.94)
Kappa:Lambda Ratio: 0.58 (ref 0.26–1.65)
Lambda Free Lght Chn: 2.13 mg/dL (ref 0.57–2.63)

## 2014-09-27 LAB — IGA: IGA: 426 mg/dL — AB (ref 68–379)

## 2014-09-27 MED ORDER — LENALIDOMIDE 25 MG PO CAPS: 25.0000 mg | ORAL_CAPSULE | Freq: Every day | ORAL | Status: DC

## 2014-09-28 ENCOUNTER — Telehealth: Payer: Self-pay | Admitting: *Deleted

## 2014-09-28 ENCOUNTER — Other Ambulatory Visit: Payer: Self-pay | Admitting: Oncology

## 2014-09-28 NOTE — Telephone Encounter (Signed)
Pt notified of light chain results. Better, per [name]. Myeloma is improving, continue current therapy. He voiced understanding.

## 2014-09-28 NOTE — Telephone Encounter (Signed)
-----   Message from Ladell Pier, MD sent at 09/27/2014  9:09 PM EDT ----- Please call patient, IgA and lamda light chains are better, myeloma is improving, continue current therapy

## 2014-10-03 ENCOUNTER — Other Ambulatory Visit (HOSPITAL_BASED_OUTPATIENT_CLINIC_OR_DEPARTMENT_OTHER): Payer: Medicare Other

## 2014-10-03 ENCOUNTER — Ambulatory Visit (HOSPITAL_BASED_OUTPATIENT_CLINIC_OR_DEPARTMENT_OTHER): Payer: Medicare Other

## 2014-10-03 VITALS — BP 123/53 | HR 58 | Temp 98.1°F | Resp 20

## 2014-10-03 DIAGNOSIS — Z5112 Encounter for antineoplastic immunotherapy: Secondary | ICD-10-CM | POA: Diagnosis present

## 2014-10-03 DIAGNOSIS — C9000 Multiple myeloma not having achieved remission: Secondary | ICD-10-CM

## 2014-10-03 DIAGNOSIS — C61 Malignant neoplasm of prostate: Secondary | ICD-10-CM

## 2014-10-03 LAB — CBC WITH DIFFERENTIAL/PLATELET
BASO%: 0.7 % (ref 0.0–2.0)
BASOS ABS: 0 10*3/uL (ref 0.0–0.1)
EOS%: 6.7 % (ref 0.0–7.0)
Eosinophils Absolute: 0.3 10*3/uL (ref 0.0–0.5)
HCT: 32.9 % — ABNORMAL LOW (ref 38.4–49.9)
HEMOGLOBIN: 10.7 g/dL — AB (ref 13.0–17.1)
LYMPH%: 7.3 % — AB (ref 14.0–49.0)
MCH: 29 pg (ref 27.2–33.4)
MCHC: 32.6 g/dL (ref 32.0–36.0)
MCV: 89.1 fL (ref 79.3–98.0)
MONO#: 0.5 10*3/uL (ref 0.1–0.9)
MONO%: 13.4 % (ref 0.0–14.0)
NEUT%: 71.9 % (ref 39.0–75.0)
NEUTROS ABS: 2.7 10*3/uL (ref 1.5–6.5)
Platelets: 92 10*3/uL — ABNORMAL LOW (ref 140–400)
RBC: 3.69 10*6/uL — AB (ref 4.20–5.82)
RDW: 17.3 % — AB (ref 11.0–14.6)
WBC: 3.8 10*3/uL — AB (ref 4.0–10.3)
lymph#: 0.3 10*3/uL — ABNORMAL LOW (ref 0.9–3.3)

## 2014-10-03 MED ORDER — ONDANSETRON HCL 8 MG PO TABS
ORAL_TABLET | ORAL | Status: AC
  Filled 2014-10-03: qty 1

## 2014-10-03 MED ORDER — BORTEZOMIB CHEMO SQ INJECTION 3.5 MG (2.5MG/ML)
1.3000 mg/m2 | Freq: Once | INTRAMUSCULAR | Status: AC
  Administered 2014-10-03: 2.5 mg via SUBCUTANEOUS
  Filled 2014-10-03: qty 2.5

## 2014-10-03 MED ORDER — ONDANSETRON HCL 8 MG PO TABS
8.0000 mg | ORAL_TABLET | Freq: Once | ORAL | Status: AC
Start: 2014-10-03 — End: 2014-10-03
  Administered 2014-10-03: 8 mg via ORAL

## 2014-10-03 NOTE — Progress Notes (Signed)
Per Jonelle Sports, RN OK to treat pt with platelets of 92.  States Per Dr Lindi Adie pt ok to get chemo today.

## 2014-10-05 ENCOUNTER — Encounter: Payer: Self-pay | Admitting: Radiation Oncology

## 2014-10-05 DIAGNOSIS — Z510 Encounter for antineoplastic radiation therapy: Secondary | ICD-10-CM | POA: Diagnosis not present

## 2014-10-05 DIAGNOSIS — C61 Malignant neoplasm of prostate: Secondary | ICD-10-CM | POA: Diagnosis not present

## 2014-10-10 ENCOUNTER — Ambulatory Visit: Payer: Medicare Other

## 2014-10-10 ENCOUNTER — Other Ambulatory Visit (HOSPITAL_BASED_OUTPATIENT_CLINIC_OR_DEPARTMENT_OTHER): Payer: Medicare Other

## 2014-10-10 DIAGNOSIS — C61 Malignant neoplasm of prostate: Secondary | ICD-10-CM

## 2014-10-10 DIAGNOSIS — C9000 Multiple myeloma not having achieved remission: Secondary | ICD-10-CM | POA: Diagnosis present

## 2014-10-10 LAB — CBC WITH DIFFERENTIAL/PLATELET
BASO%: 1.2 % (ref 0.0–2.0)
Basophils Absolute: 0 10*3/uL (ref 0.0–0.1)
EOS ABS: 0.2 10*3/uL (ref 0.0–0.5)
EOS%: 7.5 % — AB (ref 0.0–7.0)
HCT: 34.5 % — ABNORMAL LOW (ref 38.4–49.9)
HEMOGLOBIN: 11.2 g/dL — AB (ref 13.0–17.1)
LYMPH%: 15.1 % (ref 14.0–49.0)
MCH: 28.6 pg (ref 27.2–33.4)
MCHC: 32.5 g/dL (ref 32.0–36.0)
MCV: 88.2 fL (ref 79.3–98.0)
MONO#: 0.5 10*3/uL (ref 0.1–0.9)
MONO%: 20.4 % — AB (ref 0.0–14.0)
NEUT%: 55.8 % (ref 39.0–75.0)
NEUTROS ABS: 1.3 10*3/uL — AB (ref 1.5–6.5)
Platelets: 121 10*3/uL — ABNORMAL LOW (ref 140–400)
RBC: 3.9 10*6/uL — ABNORMAL LOW (ref 4.20–5.82)
RDW: 17.1 % — AB (ref 11.0–14.6)
WBC: 2.3 10*3/uL — AB (ref 4.0–10.3)
lymph#: 0.4 10*3/uL — ABNORMAL LOW (ref 0.9–3.3)

## 2014-10-10 NOTE — Progress Notes (Signed)
Pt arrived to infusion room for treatment with SQ Velcade.  ANC found to be 1.3.  Spoke with Ned Card, NP who reviewed patient's labs and schedule.  Decision made to hold treatment for today as well as pt to hold starting Revlimid tonight as scheduled.  Pt informed to return next week on 9/20 as scheduled for labs and evaluation for treatment.  Pt and his wife verbalized understanding and had no further questions prior to d/c from infusion.

## 2014-10-13 ENCOUNTER — Other Ambulatory Visit: Payer: Self-pay | Admitting: Oncology

## 2014-10-15 ENCOUNTER — Other Ambulatory Visit: Payer: Self-pay | Admitting: Oncology

## 2014-10-17 ENCOUNTER — Other Ambulatory Visit: Payer: Medicare Other

## 2014-10-17 ENCOUNTER — Ambulatory Visit (HOSPITAL_BASED_OUTPATIENT_CLINIC_OR_DEPARTMENT_OTHER): Payer: Medicare Other

## 2014-10-17 ENCOUNTER — Other Ambulatory Visit (HOSPITAL_BASED_OUTPATIENT_CLINIC_OR_DEPARTMENT_OTHER): Payer: Medicare Other

## 2014-10-17 VITALS — BP 152/87 | HR 63 | Temp 98.6°F | Resp 18

## 2014-10-17 DIAGNOSIS — Z5112 Encounter for antineoplastic immunotherapy: Secondary | ICD-10-CM

## 2014-10-17 DIAGNOSIS — C9000 Multiple myeloma not having achieved remission: Secondary | ICD-10-CM

## 2014-10-17 LAB — COMPREHENSIVE METABOLIC PANEL (CC13)
ALT: 13 U/L (ref 0–55)
ANION GAP: 4 meq/L (ref 3–11)
AST: 13 U/L (ref 5–34)
Albumin: 3.6 g/dL (ref 3.5–5.0)
Alkaline Phosphatase: 54 U/L (ref 40–150)
BILIRUBIN TOTAL: 0.42 mg/dL (ref 0.20–1.20)
BUN: 11.3 mg/dL (ref 7.0–26.0)
CHLORIDE: 110 meq/L — AB (ref 98–109)
CO2: 28 meq/L (ref 22–29)
Calcium: 9 mg/dL (ref 8.4–10.4)
Creatinine: 0.7 mg/dL (ref 0.7–1.3)
GLUCOSE: 100 mg/dL (ref 70–140)
Potassium: 4.2 mEq/L (ref 3.5–5.1)
SODIUM: 142 meq/L (ref 136–145)
TOTAL PROTEIN: 6.1 g/dL — AB (ref 6.4–8.3)

## 2014-10-17 LAB — CBC WITH DIFFERENTIAL/PLATELET
BASO%: 0.6 % (ref 0.0–2.0)
BASOS ABS: 0 10*3/uL (ref 0.0–0.1)
EOS%: 1.7 % (ref 0.0–7.0)
Eosinophils Absolute: 0.1 10*3/uL (ref 0.0–0.5)
HEMATOCRIT: 36.2 % — AB (ref 38.4–49.9)
HEMOGLOBIN: 11.5 g/dL — AB (ref 13.0–17.1)
LYMPH#: 0.3 10*3/uL — AB (ref 0.9–3.3)
LYMPH%: 9.7 % — ABNORMAL LOW (ref 14.0–49.0)
MCH: 28.2 pg (ref 27.2–33.4)
MCHC: 31.8 g/dL — AB (ref 32.0–36.0)
MCV: 88.7 fL (ref 79.3–98.0)
MONO#: 0.2 10*3/uL (ref 0.1–0.9)
MONO%: 4.6 % (ref 0.0–14.0)
NEUT#: 2.9 10*3/uL (ref 1.5–6.5)
NEUT%: 83.4 % — ABNORMAL HIGH (ref 39.0–75.0)
Platelets: 173 10*3/uL (ref 140–400)
RBC: 4.08 10*6/uL — AB (ref 4.20–5.82)
RDW: 15.1 % — AB (ref 11.0–14.6)
WBC: 3.5 10*3/uL — AB (ref 4.0–10.3)

## 2014-10-17 MED ORDER — ONDANSETRON HCL 8 MG PO TABS
ORAL_TABLET | ORAL | Status: AC
  Filled 2014-10-17: qty 1

## 2014-10-17 MED ORDER — BORTEZOMIB CHEMO SQ INJECTION 3.5 MG (2.5MG/ML)
1.3000 mg/m2 | Freq: Once | INTRAMUSCULAR | Status: AC
  Administered 2014-10-17: 2.5 mg via SUBCUTANEOUS
  Filled 2014-10-17: qty 2.5

## 2014-10-17 MED ORDER — ONDANSETRON HCL 8 MG PO TABS
8.0000 mg | ORAL_TABLET | Freq: Once | ORAL | Status: AC
  Administered 2014-10-17: 8 mg via ORAL

## 2014-10-17 NOTE — Patient Instructions (Signed)
[facility] Discharge Instructions for Patients Receiving Chemotherapy  Today you received the following chemotherapy agents Velcade. To help prevent nausea and vomiting after your treatment, we encourage you to take your nausea medication as directed.  If you develop nausea and vomiting that is not controlled by your nausea medication, call the clinic.   BELOW ARE SYMPTOMS THAT SHOULD BE REPORTED IMMEDIATELY:  *FEVER GREATER THAN 100.5 F  *CHILLS WITH OR WITHOUT FEVER  NAUSEA AND VOMITING THAT IS NOT CONTROLLED WITH YOUR NAUSEA MEDICATION  *UNUSUAL SHORTNESS OF BREATH  *UNUSUAL BRUISING OR BLEEDING  TENDERNESS IN MOUTH AND THROAT WITH OR WITHOUT PRESENCE OF ULCERS  *URINARY PROBLEMS  *BOWEL PROBLEMS  UNUSUAL RASH Items with * indicate a potential emergency and should be followed up as soon as possible.  Feel free to call the clinic you have any questions or concerns. The clinic phone number is [phone].  Please show the CHEMO ALERT CARD at check-in to the Emergency Department and triage nurse.    

## 2014-10-21 ENCOUNTER — Other Ambulatory Visit: Payer: Self-pay | Admitting: Oncology

## 2014-10-24 ENCOUNTER — Other Ambulatory Visit: Payer: Self-pay | Admitting: Dermatology

## 2014-10-24 ENCOUNTER — Telehealth: Payer: Self-pay | Admitting: Oncology

## 2014-10-24 ENCOUNTER — Ambulatory Visit (HOSPITAL_BASED_OUTPATIENT_CLINIC_OR_DEPARTMENT_OTHER): Payer: Medicare Other | Admitting: Nurse Practitioner

## 2014-10-24 ENCOUNTER — Other Ambulatory Visit: Payer: Self-pay | Admitting: Oncology

## 2014-10-24 ENCOUNTER — Other Ambulatory Visit (HOSPITAL_BASED_OUTPATIENT_CLINIC_OR_DEPARTMENT_OTHER): Payer: Medicare Other

## 2014-10-24 ENCOUNTER — Ambulatory Visit (HOSPITAL_BASED_OUTPATIENT_CLINIC_OR_DEPARTMENT_OTHER): Payer: Medicare Other

## 2014-10-24 VITALS — BP 135/68 | HR 62 | Temp 98.4°F | Resp 17 | Ht 71.0 in | Wt 182.1 lb

## 2014-10-24 DIAGNOSIS — R21 Rash and other nonspecific skin eruption: Secondary | ICD-10-CM | POA: Diagnosis not present

## 2014-10-24 DIAGNOSIS — C61 Malignant neoplasm of prostate: Secondary | ICD-10-CM | POA: Diagnosis not present

## 2014-10-24 DIAGNOSIS — C9000 Multiple myeloma not having achieved remission: Secondary | ICD-10-CM | POA: Diagnosis not present

## 2014-10-24 DIAGNOSIS — D485 Neoplasm of uncertain behavior of skin: Secondary | ICD-10-CM | POA: Diagnosis not present

## 2014-10-24 DIAGNOSIS — Z5112 Encounter for antineoplastic immunotherapy: Secondary | ICD-10-CM

## 2014-10-24 LAB — CBC WITH DIFFERENTIAL/PLATELET
BASO%: 0.5 % (ref 0.0–2.0)
Basophils Absolute: 0 10*3/uL (ref 0.0–0.1)
EOS%: 2.5 % (ref 0.0–7.0)
Eosinophils Absolute: 0.1 10*3/uL (ref 0.0–0.5)
HEMATOCRIT: 37.8 % — AB (ref 38.4–49.9)
HEMOGLOBIN: 12.1 g/dL — AB (ref 13.0–17.1)
LYMPH#: 0.3 10*3/uL — AB (ref 0.9–3.3)
LYMPH%: 7.8 % — ABNORMAL LOW (ref 14.0–49.0)
MCH: 27.6 pg (ref 27.2–33.4)
MCHC: 31.8 g/dL — ABNORMAL LOW (ref 32.0–36.0)
MCV: 86.6 fL (ref 79.3–98.0)
MONO#: 0.3 10*3/uL (ref 0.1–0.9)
MONO%: 7 % (ref 0.0–14.0)
NEUT#: 3.4 10*3/uL (ref 1.5–6.5)
NEUT%: 82.2 % — ABNORMAL HIGH (ref 39.0–75.0)
Platelets: 125 10*3/uL — ABNORMAL LOW (ref 140–400)
RBC: 4.37 10*6/uL (ref 4.20–5.82)
RDW: 16.9 % — AB (ref 11.0–14.6)
WBC: 4.1 10*3/uL (ref 4.0–10.3)

## 2014-10-24 LAB — COMPREHENSIVE METABOLIC PANEL (CC13)
ALK PHOS: 52 U/L (ref 40–150)
ALT: 14 U/L (ref 0–55)
ANION GAP: 8 meq/L (ref 3–11)
AST: 14 U/L (ref 5–34)
Albumin: 3.5 g/dL (ref 3.5–5.0)
BILIRUBIN TOTAL: 0.47 mg/dL (ref 0.20–1.20)
BUN: 9.8 mg/dL (ref 7.0–26.0)
CO2: 27 meq/L (ref 22–29)
Calcium: 8.6 mg/dL (ref 8.4–10.4)
Chloride: 108 mEq/L (ref 98–109)
Creatinine: 0.7 mg/dL (ref 0.7–1.3)
EGFR: 89 mL/min/{1.73_m2} — AB (ref >90)
Glucose: 91 mg/dl (ref 70–140)
POTASSIUM: 3.7 meq/L (ref 3.5–5.1)
Sodium: 143 mEq/L (ref 136–145)
TOTAL PROTEIN: 5.8 g/dL — AB (ref 6.4–8.3)

## 2014-10-24 MED ORDER — LENALIDOMIDE 15 MG PO CAPS: 15.0000 mg | ORAL_CAPSULE | Freq: Every day | ORAL | Status: DC

## 2014-10-24 MED ORDER — ONDANSETRON HCL 8 MG PO TABS
ORAL_TABLET | ORAL | Status: AC
  Filled 2014-10-24: qty 1

## 2014-10-24 MED ORDER — ONDANSETRON HCL 8 MG PO TABS
8.0000 mg | ORAL_TABLET | Freq: Once | ORAL | Status: AC
  Administered 2014-10-24: 8 mg via ORAL

## 2014-10-24 MED ORDER — BORTEZOMIB CHEMO SQ INJECTION 3.5 MG (2.5MG/ML)
1.3000 mg/m2 | Freq: Once | INTRAMUSCULAR | Status: AC
  Administered 2014-10-24: 2.5 mg via SUBCUTANEOUS
  Filled 2014-10-24: qty 2.5

## 2014-10-24 NOTE — Progress Notes (Addendum)
  [facility] OFFICE PROGRESS NOTE   Diagnosis:  Multiple myeloma  INTERVAL HISTORY:   Mr. Schonberg returns as scheduled. Revlimid and dexamethasone were held on 10/10/2014. He began a new cycle on 10/17/2014. He denies nausea/vomiting. No mouth sores. Bowel habits alternate constipation/diarrhea. He continues to have urinary urgency. He has recently noted a rash on the left arm, left leg and right leg.  Objective:  Vital signs in last 24 hours:  Blood pressure 135/68, pulse 62, temperature 98.4 F (36.9 C), temperature source Oral, resp. rate 17, height _0  (1.803 m), weight 182 lb 1.6 oz (82.6 kg), SpO2 97 %.    HEENT: No thrush or ulcers. Lymphatics: No palpable cervical or supra clavicular lymph nodes. Resp: Lungs clear bilaterally. Cardio: Regular rate and rhythm. GI: Abdomen soft and nontender. No organomegaly. Vascular: No leg edema. Skin: Small erythematous/purple slightly raised skin lesions located on the right hand/lower arm, left knee, right leg below the knee. The lesions are not vesicular appearing.    Lab Results:  Lab Results  Component Value Date   WBC 4.1 10/24/2014   HGB 12.1* 10/24/2014   HCT 37.8* 10/24/2014   MCV 86.6 10/24/2014   PLT 125* 10/24/2014   NEUTROABS 3.4 10/24/2014    Imaging:  No results found.  Medications: I have reviewed the patient's current medications.  Assessment/Plan: 1. Multiple myeloma-confirmed on a bone marrow biopsy 08/07/2014, IgA lambda  Serum M spike and increased serum free lambda light chains  Myeloma FISH panel negative for chromosome 4, 11, 12, 13, 14, and 17 abnormalities, cytogenetics with no metaphases  Bone survey 08/15/2014 with indeterminant lucent skull lesions  Cycle 1 RVD 08/22/2014  Cycle 2 RVD 09/19/2014  Cycle 3 RVD 10/17/2014  2. 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 History of intermittent prostatitis  4.   Skin rash. He has been referred to dermatology.   Disposition: Mr. Bena appears stable. He began cycle 3 RVD on 10/17/2014. The most recent IgA was significantly improved. We will follow-up on the IgA from today.  [name] recommends changing the Revlimid to 15 mg daily 2 weeks on/1 week off, decreasing dexamethasone to 20 mg every week, changing Velcade to a 3 week on/1 week off schedule.   He will return to begin the next cycle of RVD on 11/14/2014. We will see him in follow-up on 11/28/2014.  We made a referral to Dr. Denna Haggard to evaluate the rash.  Patient seen with [name]. 25 minutes were spent face-to-face at today's visit with the majority of that time involved in counseling/coordination of care.  Ned Card ANP/GNP-BC   10/24/2014  12:52 PM  This was a shared visit with Ned Card. 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 The anemia is significantly improved.  I contacted Dr.Tafeen. He will evaluate [name] today.  We will decrease the dose of Decadron and Revlimid going forward.  Julieanne Manson, M.D.

## 2014-10-24 NOTE — Patient Instructions (Addendum)
Change Decadron to 20 mg once a week Change Velcade to 3 weeks on/1 week off Change Revlimid to 15 mg daily two weeks on/1 week off

## 2014-10-24 NOTE — Telephone Encounter (Signed)
Gave patient avs report and appointments for October and November. Per patient he already has an appointment with Dr. Denna Haggard today at 4 pm.

## 2014-10-24 NOTE — Telephone Encounter (Signed)
Prescription for Revlimed put in to [facility] box as per Owens Shark.

## 2014-10-25 ENCOUNTER — Encounter: Payer: Self-pay | Admitting: Oncology

## 2014-10-25 ENCOUNTER — Other Ambulatory Visit: Payer: Self-pay | Admitting: *Deleted

## 2014-10-25 MED ORDER — LENALIDOMIDE 15 MG PO CAPS: 15.0000 mg | ORAL_CAPSULE | Freq: Every day | ORAL | Status: DC

## 2014-10-25 NOTE — Telephone Encounter (Signed)
E-prescribed Revlimid to [facility]. Auth # obtained.

## 2014-10-25 NOTE — Progress Notes (Signed)
I faxed biologics req for revlimid

## 2014-10-26 LAB — PROTEIN ELECTROPHORESIS, SERUM
ABNORMAL PROTEIN BAND1: 0.2 g/dL
ALPHA-2-GLOBULIN: 0.8 g/dL (ref 0.5–0.9)
Abnormal Protein Band2: 0.1 g/dL
Abnormal Protein Band3: NOT DETECTED g/dL
Albumin ELP: 3.5 g/dL — ABNORMAL LOW (ref 3.8–4.8)
Alpha-1-Globulin: 0.3 g/dL (ref 0.2–0.3)
BETA 2: 0.3 g/dL (ref 0.2–0.5)
BETA GLOBULIN: 0.4 g/dL (ref 0.4–0.6)
GAMMA GLOBULIN: 0.4 g/dL — AB (ref 0.8–1.7)
Total Protein, Serum Electrophoresis: 5.7 g/dL — ABNORMAL LOW (ref 6.1–8.1)

## 2014-10-26 LAB — KAPPA/LAMBDA LIGHT CHAINS
KAPPA LAMBDA RATIO: 0.96 (ref 0.26–1.65)
Kappa free light chain: 1.2 mg/dL (ref 0.33–1.94)
Lambda Free Lght Chn: 1.25 mg/dL (ref 0.57–2.63)

## 2014-10-26 LAB — IGA: IGA: 131 mg/dL (ref 68–379)

## 2014-10-27 ENCOUNTER — Telehealth: Payer: Self-pay | Admitting: *Deleted

## 2014-10-27 NOTE — Telephone Encounter (Signed)
-----   Message from Ladell Pier, MD sent at 10/26/2014  4:58 PM EDT ----- Please call patient, IgA, lambda light chains, and m-spike are better

## 2014-10-27 NOTE — Telephone Encounter (Signed)
FYI.  [phone] voicemail retrieved at [phone], forwarded to [phone] at [phone]. [facility] called with questions requiring clarification of recent Revlimid order.  Cycle/total number days supply, rest period and start date are questions Pharmacy and patient have.Marland Kitchen

## 2014-10-27 NOTE — Telephone Encounter (Signed)
Called pt with lab results. IgA, lambda light chains and M-Spike are better, he voiced understanding.  Received message from [facility] to clarify Revlimid cycle. Pt had questions about cycle when they called him. Spoke with pt, informed him [name] decided to continue 14 days on with 7 days off since he will remain on Velcade. Pt voiced understanding. He will contact pharmacy for delivery.

## 2014-10-28 ENCOUNTER — Other Ambulatory Visit: Payer: Self-pay | Admitting: Oncology

## 2014-10-31 ENCOUNTER — Other Ambulatory Visit (HOSPITAL_BASED_OUTPATIENT_CLINIC_OR_DEPARTMENT_OTHER): Payer: Medicare Other

## 2014-10-31 ENCOUNTER — Ambulatory Visit (HOSPITAL_BASED_OUTPATIENT_CLINIC_OR_DEPARTMENT_OTHER): Payer: Medicare Other

## 2014-10-31 VITALS — BP 143/74 | HR 60 | Temp 97.8°F | Resp 18

## 2014-10-31 DIAGNOSIS — C9000 Multiple myeloma not having achieved remission: Secondary | ICD-10-CM

## 2014-10-31 DIAGNOSIS — Z5112 Encounter for antineoplastic immunotherapy: Secondary | ICD-10-CM

## 2014-10-31 LAB — CBC WITH DIFFERENTIAL/PLATELET
BASO%: 0.3 % (ref 0.0–2.0)
BASOS ABS: 0 10*3/uL (ref 0.0–0.1)
EOS ABS: 0.1 10*3/uL (ref 0.0–0.5)
EOS%: 1.5 % (ref 0.0–7.0)
HCT: 37.2 % — ABNORMAL LOW (ref 38.4–49.9)
HEMOGLOBIN: 11.8 g/dL — AB (ref 13.0–17.1)
LYMPH%: 7.1 % — AB (ref 14.0–49.0)
MCH: 27.2 pg (ref 27.2–33.4)
MCHC: 31.8 g/dL — AB (ref 32.0–36.0)
MCV: 85.6 fL (ref 79.3–98.0)
MONO#: 0.2 10*3/uL (ref 0.1–0.9)
MONO%: 5 % (ref 0.0–14.0)
NEUT#: 4.3 10*3/uL (ref 1.5–6.5)
NEUT%: 86.1 % — AB (ref 39.0–75.0)
Platelets: 97 10*3/uL — ABNORMAL LOW (ref 140–400)
RBC: 4.35 10*6/uL (ref 4.20–5.82)
RDW: 17.3 % — ABNORMAL HIGH (ref 11.0–14.6)
WBC: 5 10*3/uL (ref 4.0–10.3)
lymph#: 0.4 10*3/uL — ABNORMAL LOW (ref 0.9–3.3)

## 2014-10-31 MED ORDER — ONDANSETRON HCL 8 MG PO TABS
ORAL_TABLET | ORAL | Status: AC
  Filled 2014-10-31: qty 1

## 2014-10-31 MED ORDER — BORTEZOMIB CHEMO SQ INJECTION 3.5 MG (2.5MG/ML)
1.3000 mg/m2 | Freq: Once | INTRAMUSCULAR | Status: AC
  Administered 2014-10-31: 2.5 mg via SUBCUTANEOUS
  Filled 2014-10-31: qty 2.5

## 2014-10-31 MED ORDER — ONDANSETRON HCL 8 MG PO TABS
8.0000 mg | ORAL_TABLET | Freq: Once | ORAL | Status: AC
  Administered 2014-10-31: 8 mg via ORAL

## 2014-10-31 NOTE — Progress Notes (Signed)
Platelets today were 97. Spoke with [name] and was ok to proceed with treatment today.

## 2014-11-07 ENCOUNTER — Other Ambulatory Visit: Payer: Medicare Other

## 2014-11-07 ENCOUNTER — Ambulatory Visit: Payer: Medicare Other

## 2014-11-07 ENCOUNTER — Ambulatory Visit: Payer: Medicare Other | Admitting: Oncology

## 2014-11-09 DIAGNOSIS — R339 Retention of urine, unspecified: Secondary | ICD-10-CM | POA: Diagnosis not present

## 2014-11-10 ENCOUNTER — Other Ambulatory Visit: Payer: Self-pay | Admitting: Radiation Oncology

## 2014-11-12 ENCOUNTER — Other Ambulatory Visit: Payer: Self-pay | Admitting: Oncology

## 2014-11-14 ENCOUNTER — Ambulatory Visit (HOSPITAL_BASED_OUTPATIENT_CLINIC_OR_DEPARTMENT_OTHER): Payer: Medicare Other

## 2014-11-14 ENCOUNTER — Other Ambulatory Visit (HOSPITAL_BASED_OUTPATIENT_CLINIC_OR_DEPARTMENT_OTHER): Payer: Medicare Other

## 2014-11-14 VITALS — BP 150/61 | HR 71 | Temp 98.3°F | Resp 18

## 2014-11-14 DIAGNOSIS — C9000 Multiple myeloma not having achieved remission: Secondary | ICD-10-CM

## 2014-11-14 DIAGNOSIS — Z5112 Encounter for antineoplastic immunotherapy: Secondary | ICD-10-CM | POA: Diagnosis present

## 2014-11-14 DIAGNOSIS — C61 Malignant neoplasm of prostate: Secondary | ICD-10-CM | POA: Diagnosis not present

## 2014-11-14 LAB — COMPREHENSIVE METABOLIC PANEL (CC13)
ALT: 10 U/L (ref 0–55)
ANION GAP: 8 meq/L (ref 3–11)
AST: 11 U/L (ref 5–34)
Albumin: 3.3 g/dL — ABNORMAL LOW (ref 3.5–5.0)
Alkaline Phosphatase: 48 U/L (ref 40–150)
BUN: 10.6 mg/dL (ref 7.0–26.0)
CALCIUM: 9.2 mg/dL (ref 8.4–10.4)
CHLORIDE: 109 meq/L (ref 98–109)
CO2: 29 meq/L (ref 22–29)
CREATININE: 0.7 mg/dL (ref 0.7–1.3)
EGFR: 88 mL/min/{1.73_m2} — ABNORMAL LOW (ref >90)
Glucose: 84 mg/dl (ref 70–140)
POTASSIUM: 3.7 meq/L (ref 3.5–5.1)
Sodium: 145 mEq/L (ref 136–145)
Total Bilirubin: 0.62 mg/dL (ref 0.20–1.20)
Total Protein: 5.8 g/dL — ABNORMAL LOW (ref 6.4–8.3)

## 2014-11-14 LAB — CBC WITH DIFFERENTIAL/PLATELET
BASO%: 1 % (ref 0.0–2.0)
Basophils Absolute: 0 10*3/uL (ref 0.0–0.1)
EOS ABS: 0.2 10*3/uL (ref 0.0–0.5)
EOS%: 4.4 % (ref 0.0–7.0)
HCT: 38.7 % (ref 38.4–49.9)
HGB: 12.3 g/dL — ABNORMAL LOW (ref 13.0–17.1)
LYMPH%: 9.2 % — AB (ref 14.0–49.0)
MCH: 27 pg — ABNORMAL LOW (ref 27.2–33.4)
MCHC: 31.9 g/dL — ABNORMAL LOW (ref 32.0–36.0)
MCV: 84.6 fL (ref 79.3–98.0)
MONO#: 0.4 10*3/uL (ref 0.1–0.9)
MONO%: 9.4 % (ref 0.0–14.0)
NEUT%: 76 % — ABNORMAL HIGH (ref 39.0–75.0)
NEUTROS ABS: 2.9 10*3/uL (ref 1.5–6.5)
Platelets: 187 10*3/uL (ref 140–400)
RBC: 4.58 10*6/uL (ref 4.20–5.82)
RDW: 16.9 % — ABNORMAL HIGH (ref 11.0–14.6)
WBC: 3.8 10*3/uL — AB (ref 4.0–10.3)
lymph#: 0.3 10*3/uL — ABNORMAL LOW (ref 0.9–3.3)

## 2014-11-14 MED ORDER — BORTEZOMIB CHEMO SQ INJECTION 3.5 MG (2.5MG/ML)
1.3000 mg/m2 | Freq: Once | INTRAMUSCULAR | Status: AC
  Administered 2014-11-14: 2.5 mg via SUBCUTANEOUS
  Filled 2014-11-14: qty 2.5

## 2014-11-14 MED ORDER — ONDANSETRON HCL 8 MG PO TABS
8.0000 mg | ORAL_TABLET | Freq: Once | ORAL | Status: AC
  Administered 2014-11-14: 8 mg via ORAL

## 2014-11-14 MED ORDER — ONDANSETRON HCL 8 MG PO TABS
ORAL_TABLET | ORAL | Status: AC
  Filled 2014-11-14: qty 1

## 2014-11-14 NOTE — Patient Instructions (Signed)
[facility] Discharge Instructions for Patients Receiving Chemotherapy  Today you received the following chemotherapy agents Velcade. To help prevent nausea and vomiting after your treatment, we encourage you to take your nausea medication as directed.  If you develop nausea and vomiting that is not controlled by your nausea medication, call the clinic.   BELOW ARE SYMPTOMS THAT SHOULD BE REPORTED IMMEDIATELY:  *FEVER GREATER THAN 100.5 F  *CHILLS WITH OR WITHOUT FEVER  NAUSEA AND VOMITING THAT IS NOT CONTROLLED WITH YOUR NAUSEA MEDICATION  *UNUSUAL SHORTNESS OF BREATH  *UNUSUAL BRUISING OR BLEEDING  TENDERNESS IN MOUTH AND THROAT WITH OR WITHOUT PRESENCE OF ULCERS  *URINARY PROBLEMS  *BOWEL PROBLEMS  UNUSUAL RASH Items with * indicate a potential emergency and should be followed up as soon as possible.  Feel free to call the clinic you have any questions or concerns. The clinic phone number is [phone].  Please show the CHEMO ALERT CARD at check-in to the Emergency Department and triage nurse.    

## 2014-11-16 DIAGNOSIS — N320 Bladder-neck obstruction: Secondary | ICD-10-CM | POA: Diagnosis not present

## 2014-11-16 DIAGNOSIS — N390 Urinary tract infection, site not specified: Secondary | ICD-10-CM | POA: Diagnosis not present

## 2014-11-16 DIAGNOSIS — C61 Malignant neoplasm of prostate: Secondary | ICD-10-CM | POA: Diagnosis not present

## 2014-11-16 DIAGNOSIS — B961 Klebsiella pneumoniae [K. pneumoniae] as the cause of diseases classified elsewhere: Secondary | ICD-10-CM | POA: Diagnosis not present

## 2014-11-18 ENCOUNTER — Other Ambulatory Visit: Payer: Self-pay | Admitting: Oncology

## 2014-11-19 NOTE — Progress Notes (Signed)
  Radiation Oncology         [phone] ________________________________  Name: Mitchell Lowery MRN: [id number]  Date: 10/05/2014  DOB: Nov 13, 1934  3D Planning Note   Prostate Brachytherapy Post-Implant Dosimetry  Diagnosis: 80 y.o. gentleman with stage T1c adenocarcinoma of the prostate with a Gleason's score of 4+5 and a PSA of 10.4  Narrative: On a previous date, Mitchell Lowery returned following prostate seed implantation for post implant planning. He underwent CT scan complex simulation to delineate the three-dimensional structures of the pelvis and demonstrate the radiation distribution.  Since that time, the seed localization, and complex isodose planning with dose volume histograms have now been completed.  Results:   Prostate Coverage - The dose of radiation delivered to the 90% or more of the prostate gland (D90) was 120.0% of the prescription dose. This exceeds our goal of greater than 90%. Rectal Sparing - The volume of rectal tissue receiving the prescription dose or higher was 0.0 cc. This falls under our thresholds tolerance of 1.0 cc.  Impression: The prostate seed implant appears to show adequate target coverage and appropriate rectal sparing.  Plan:  The patient will continue to follow with urology for ongoing PSA determinations. I would anticipate a high likelihood for local tumor control with minimal risk for rectal morbidity.  ________________________________  Sheral Apley Tammi Klippel, M.D.

## 2014-11-21 ENCOUNTER — Other Ambulatory Visit (HOSPITAL_BASED_OUTPATIENT_CLINIC_OR_DEPARTMENT_OTHER): Payer: Medicare Other

## 2014-11-21 ENCOUNTER — Other Ambulatory Visit: Payer: Self-pay | Admitting: Oncology

## 2014-11-21 ENCOUNTER — Ambulatory Visit (HOSPITAL_BASED_OUTPATIENT_CLINIC_OR_DEPARTMENT_OTHER): Payer: Medicare Other

## 2014-11-21 VITALS — BP 155/63 | HR 62 | Temp 98.2°F | Resp 16

## 2014-11-21 DIAGNOSIS — C9000 Multiple myeloma not having achieved remission: Secondary | ICD-10-CM

## 2014-11-21 DIAGNOSIS — Z5112 Encounter for antineoplastic immunotherapy: Secondary | ICD-10-CM | POA: Diagnosis present

## 2014-11-21 LAB — CBC WITH DIFFERENTIAL/PLATELET
BASO%: 0.8 % (ref 0.0–2.0)
Basophils Absolute: 0 10*3/uL (ref 0.0–0.1)
EOS%: 3.3 % (ref 0.0–7.0)
Eosinophils Absolute: 0.1 10*3/uL (ref 0.0–0.5)
HEMATOCRIT: 39.2 % (ref 38.4–49.9)
HEMOGLOBIN: 12.5 g/dL — AB (ref 13.0–17.1)
LYMPH#: 0.4 10*3/uL — AB (ref 0.9–3.3)
LYMPH%: 11.5 % — ABNORMAL LOW (ref 14.0–49.0)
MCH: 26.9 pg — ABNORMAL LOW (ref 27.2–33.4)
MCHC: 32 g/dL (ref 32.0–36.0)
MCV: 84 fL (ref 79.3–98.0)
MONO#: 0.2 10*3/uL (ref 0.1–0.9)
MONO%: 7.1 % (ref 0.0–14.0)
NEUT%: 77.3 % — AB (ref 39.0–75.0)
NEUTROS ABS: 2.5 10*3/uL (ref 1.5–6.5)
PLATELETS: 127 10*3/uL — AB (ref 140–400)
RBC: 4.66 10*6/uL (ref 4.20–5.82)
RDW: 17.1 % — AB (ref 11.0–14.6)
WBC: 3.3 10*3/uL — AB (ref 4.0–10.3)

## 2014-11-21 MED ORDER — BORTEZOMIB CHEMO SQ INJECTION 3.5 MG (2.5MG/ML)
1.3000 mg/m2 | Freq: Once | INTRAMUSCULAR | Status: AC
  Administered 2014-11-21: 2.5 mg via SUBCUTANEOUS
  Filled 2014-11-21: qty 2.5

## 2014-11-21 MED ORDER — ONDANSETRON HCL 8 MG PO TABS
ORAL_TABLET | ORAL | Status: AC
  Filled 2014-11-21: qty 1

## 2014-11-21 MED ORDER — ONDANSETRON HCL 8 MG PO TABS
8.0000 mg | ORAL_TABLET | Freq: Once | ORAL | Status: AC
  Administered 2014-11-21: 8 mg via ORAL

## 2014-11-21 NOTE — Progress Notes (Signed)
Ok to treat without CMET per [name].

## 2014-11-21 NOTE — Patient Instructions (Signed)
[facility] Discharge Instructions for Patients Receiving Chemotherapy  Today you received the following chemotherapy agents Velcade.  To help prevent nausea and vomiting after your treatment, we encourage you to take your nausea medication as directed.    If you develop nausea and vomiting that is not controlled by your nausea medication, call the clinic.   BELOW ARE SYMPTOMS THAT SHOULD BE REPORTED IMMEDIATELY:  *FEVER GREATER THAN 100.5 F  *CHILLS WITH OR WITHOUT FEVER  NAUSEA AND VOMITING THAT IS NOT CONTROLLED WITH YOUR NAUSEA MEDICATION  *UNUSUAL SHORTNESS OF BREATH  *UNUSUAL BRUISING OR BLEEDING  TENDERNESS IN MOUTH AND THROAT WITH OR WITHOUT PRESENCE OF ULCERS  *URINARY PROBLEMS  *BOWEL PROBLEMS  UNUSUAL RASH Items with * indicate a potential emergency and should be followed up as soon as possible.  Feel free to call the clinic you have any questions or concerns. The clinic phone number is [phone].    

## 2014-11-26 ENCOUNTER — Other Ambulatory Visit: Payer: Self-pay | Admitting: Oncology

## 2014-11-28 ENCOUNTER — Other Ambulatory Visit (HOSPITAL_BASED_OUTPATIENT_CLINIC_OR_DEPARTMENT_OTHER): Payer: Medicare Other

## 2014-11-28 ENCOUNTER — Ambulatory Visit (HOSPITAL_BASED_OUTPATIENT_CLINIC_OR_DEPARTMENT_OTHER): Payer: Medicare Other

## 2014-11-28 ENCOUNTER — Ambulatory Visit (HOSPITAL_BASED_OUTPATIENT_CLINIC_OR_DEPARTMENT_OTHER): Payer: Medicare Other | Admitting: Oncology

## 2014-11-28 ENCOUNTER — Telehealth: Payer: Self-pay | Admitting: Oncology

## 2014-11-28 ENCOUNTER — Other Ambulatory Visit: Payer: Self-pay | Admitting: *Deleted

## 2014-11-28 VITALS — BP 139/71 | HR 56 | Temp 99.2°F | Resp 19 | Ht 71.0 in | Wt 180.5 lb

## 2014-11-28 DIAGNOSIS — Z23 Encounter for immunization: Secondary | ICD-10-CM

## 2014-11-28 DIAGNOSIS — Z5112 Encounter for antineoplastic immunotherapy: Secondary | ICD-10-CM

## 2014-11-28 DIAGNOSIS — D649 Anemia, unspecified: Secondary | ICD-10-CM

## 2014-11-28 DIAGNOSIS — C9000 Multiple myeloma not having achieved remission: Secondary | ICD-10-CM

## 2014-11-28 LAB — COMPREHENSIVE METABOLIC PANEL (CC13)
ALT: 12 U/L (ref 0–55)
AST: 12 U/L (ref 5–34)
Albumin: 3.3 g/dL — ABNORMAL LOW (ref 3.5–5.0)
Alkaline Phosphatase: 49 U/L (ref 40–150)
Anion Gap: 8 mEq/L (ref 3–11)
BUN: 10.4 mg/dL (ref 7.0–26.0)
CHLORIDE: 109 meq/L (ref 98–109)
CO2: 28 meq/L (ref 22–29)
Calcium: 9 mg/dL (ref 8.4–10.4)
Creatinine: 0.7 mg/dL (ref 0.7–1.3)
EGFR: 88 mL/min/{1.73_m2} — AB (ref >90)
GLUCOSE: 97 mg/dL (ref 70–140)
POTASSIUM: 3.6 meq/L (ref 3.5–5.1)
SODIUM: 145 meq/L (ref 136–145)
Total Bilirubin: 0.41 mg/dL (ref 0.20–1.20)
Total Protein: 5.8 g/dL — ABNORMAL LOW (ref 6.4–8.3)

## 2014-11-28 LAB — CBC WITH DIFFERENTIAL/PLATELET
BASO%: 0.5 % (ref 0.0–2.0)
Basophils Absolute: 0 10*3/uL (ref 0.0–0.1)
EOS%: 7.4 % — AB (ref 0.0–7.0)
Eosinophils Absolute: 0.3 10*3/uL (ref 0.0–0.5)
HCT: 41 % (ref 38.4–49.9)
HGB: 13 g/dL (ref 13.0–17.1)
LYMPH#: 0.5 10*3/uL — AB (ref 0.9–3.3)
LYMPH%: 12.4 % — ABNORMAL LOW (ref 14.0–49.0)
MCH: 27 pg — AB (ref 27.2–33.4)
MCHC: 31.7 g/dL — ABNORMAL LOW (ref 32.0–36.0)
MCV: 85.2 fL (ref 79.3–98.0)
MONO#: 0.6 10*3/uL (ref 0.1–0.9)
MONO%: 15.1 % — ABNORMAL HIGH (ref 0.0–14.0)
NEUT#: 2.6 10*3/uL (ref 1.5–6.5)
NEUT%: 64.6 % (ref 39.0–75.0)
Platelets: 93 10*3/uL — ABNORMAL LOW (ref 140–400)
RBC: 4.81 10*6/uL (ref 4.20–5.82)
RDW: 15.4 % — ABNORMAL HIGH (ref 11.0–14.6)
WBC: 4 10*3/uL (ref 4.0–10.3)
nRBC: 0 % (ref 0–0)

## 2014-11-28 LAB — TECHNOLOGIST REVIEW

## 2014-11-28 MED ORDER — ONDANSETRON HCL 8 MG PO TABS
ORAL_TABLET | ORAL | Status: AC
  Filled 2014-11-28: qty 1

## 2014-11-28 MED ORDER — ONDANSETRON HCL 8 MG PO TABS
8.0000 mg | ORAL_TABLET | Freq: Once | ORAL | Status: AC
  Administered 2014-11-28: 8 mg via ORAL

## 2014-11-28 MED ORDER — INFLUENZA VAC SPLIT QUAD 0.5 ML IM SUSY
0.5000 mL | PREFILLED_SYRINGE | Freq: Once | INTRAMUSCULAR | Status: AC
  Administered 2014-11-28: 0.5 mL via INTRAMUSCULAR
  Filled 2014-11-28: qty 0.5

## 2014-11-28 MED ORDER — BORTEZOMIB CHEMO SQ INJECTION 3.5 MG (2.5MG/ML)
1.3000 mg/m2 | Freq: Once | INTRAMUSCULAR | Status: AC
  Administered 2014-11-28: 2.5 mg via SUBCUTANEOUS
  Filled 2014-11-28: qty 2.5

## 2014-11-28 NOTE — Progress Notes (Signed)
OK to treat with PLT 93k, per [name]. Beverlee Nims, Infusion RN made aware.

## 2014-11-28 NOTE — Progress Notes (Signed)
  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 He completed another 2 week cycle of Revlimid yesterday. He reports tolerating the Revlimid well. No neuropathy symptoms. He developed progressive difficulty with urinary retention. He saw Dr. Diona Fanti and is now taking myrbetriq. He is performing self catheterization 3-4 times per day.  His energy level has improved. He is exercising.  Objective:  Vital signs in last 24 hours:  Blood pressure 139/71, pulse 56, temperature 99.2 F (37.3 C), temperature source Oral, resp. rate 19, height $RemoveBe'5\' 11"'lsAwyyeJf$  (1.803 m), weight 180 lb 8 oz (81.874 kg), SpO2 98 %.    HEENT: No thrush or ulcers Resp: Lungs clear bilaterally Cardio: Regular rate and rhythm GI: No hepatosplenomegaly Vascular: No leg edema   Lab Results:  Lab Results  Component Value Date   WBC 4.0 11/28/2014   HGB 13.0 11/28/2014   HCT 41.0 11/28/2014   MCV 85.2 11/28/2014   PLT 93* 11/28/2014   NEUTROABS 2.6 11/28/2014    10/24/2014-IgA 131, lambda free light chains 1.25, serum M spike 0.2 Medications: I have reviewed the patient's current medications.  Assessment/Plan: 1. Multiple myeloma-confirmed on a bone marrow biopsy 08/07/2014, IgA lambda  Serum M spike and increased serum free lambda light chains  Myeloma FISH panel negative for chromosome 4, 11, 12, 13, 14, and 17 abnormalities, cytogenetics with no metaphases  Bone survey 08/15/2014 with indeterminant lucent skull lesions  Cycle 1 RVD 08/22/2014  Cycle 2 RVD 09/19/2014  Cycle 3 RVD 10/17/2014  Cycle 4 RVD 11/14/2014 (Decadron reduced to 20 mg weekly, Revlimid 15 mg days 1 through 14, Velcade 3/4 weeks)  2. 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 History  of intermittent prostatitis  4. Skin rash 10/24/2014-referred to dermatology  5.   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 The anemia and markers of myeloma are significantly improved. The plan is to complete day 15 Velcade with the current cycle today and then proceed with cycle 5 beginning 12/12/2014.  He will return for an office and lab visit 12/26/2014.  He received an influenza vaccine today.  [name], MD  11/28/2014  1:52 PM

## 2014-11-28 NOTE — Patient Instructions (Signed)
[facility] Discharge Instructions for Patients Receiving Chemotherapy  Today you received the following chemotherapy agents:  Velcade  To help prevent nausea and vomiting after your treatment, we encourage you to take your nausea medication as prescribed.   If you develop nausea and vomiting that is not controlled by your nausea medication, call the clinic.   BELOW ARE SYMPTOMS THAT SHOULD BE REPORTED IMMEDIATELY:  *FEVER GREATER THAN 100.5 F  *CHILLS WITH OR WITHOUT FEVER  NAUSEA AND VOMITING THAT IS NOT CONTROLLED WITH YOUR NAUSEA MEDICATION  *UNUSUAL SHORTNESS OF BREATH  *UNUSUAL BRUISING OR BLEEDING  TENDERNESS IN MOUTH AND THROAT WITH OR WITHOUT PRESENCE OF ULCERS  *URINARY PROBLEMS  *BOWEL PROBLEMS  UNUSUAL RASH Items with * indicate a potential emergency and should be followed up as soon as possible.  Feel free to call the clinic you have any questions or concerns. The clinic phone number is [phone].  Please show the CHEMO ALERT CARD at check-in to the Emergency Department and triage nurse.   

## 2014-11-28 NOTE — Telephone Encounter (Signed)
Gave patient avs report and appointments for November.  °

## 2014-11-29 ENCOUNTER — Other Ambulatory Visit: Payer: Self-pay | Admitting: Oncology

## 2014-11-29 ENCOUNTER — Encounter: Payer: Self-pay | Admitting: Medical Oncology

## 2014-12-01 ENCOUNTER — Telehealth: Payer: Self-pay | Admitting: *Deleted

## 2014-12-01 ENCOUNTER — Other Ambulatory Visit: Payer: Self-pay | Admitting: *Deleted

## 2014-12-01 NOTE — Telephone Encounter (Signed)
Erroneous encounter

## 2014-12-01 NOTE — Progress Notes (Signed)
IgA was not performed due to lab error. [name] made aware, cancel IgA. Will repeat as scheduled 11/29.

## 2014-12-10 ENCOUNTER — Other Ambulatory Visit: Payer: Self-pay | Admitting: Oncology

## 2014-12-12 ENCOUNTER — Other Ambulatory Visit (HOSPITAL_BASED_OUTPATIENT_CLINIC_OR_DEPARTMENT_OTHER): Payer: Medicare Other

## 2014-12-12 ENCOUNTER — Ambulatory Visit (HOSPITAL_BASED_OUTPATIENT_CLINIC_OR_DEPARTMENT_OTHER): Payer: Medicare Other

## 2014-12-12 VITALS — BP 142/68 | HR 63 | Temp 97.2°F

## 2014-12-12 DIAGNOSIS — Z5112 Encounter for antineoplastic immunotherapy: Secondary | ICD-10-CM

## 2014-12-12 DIAGNOSIS — C9000 Multiple myeloma not having achieved remission: Secondary | ICD-10-CM

## 2014-12-12 LAB — CBC WITH DIFFERENTIAL/PLATELET
BASO%: 2 % (ref 0.0–2.0)
BASOS ABS: 0.1 10*3/uL (ref 0.0–0.1)
EOS ABS: 0.2 10*3/uL (ref 0.0–0.5)
EOS%: 5.9 % (ref 0.0–7.0)
HEMATOCRIT: 39.6 % (ref 38.4–49.9)
HEMOGLOBIN: 12.3 g/dL — AB (ref 13.0–17.1)
LYMPH#: 0.4 10*3/uL — AB (ref 0.9–3.3)
LYMPH%: 14.9 % (ref 14.0–49.0)
MCH: 26.7 pg — AB (ref 27.2–33.4)
MCHC: 31.1 g/dL — AB (ref 32.0–36.0)
MCV: 85.9 fL (ref 79.3–98.0)
MONO#: 0.3 10*3/uL (ref 0.1–0.9)
MONO%: 11 % (ref 0.0–14.0)
NEUT%: 66.2 % (ref 39.0–75.0)
NEUTROS ABS: 1.7 10*3/uL (ref 1.5–6.5)
PLATELETS: 149 10*3/uL (ref 140–400)
RBC: 4.61 10*6/uL (ref 4.20–5.82)
RDW: 15.7 % — AB (ref 11.0–14.6)
WBC: 2.6 10*3/uL — AB (ref 4.0–10.3)

## 2014-12-12 MED ORDER — ONDANSETRON HCL 8 MG PO TABS
ORAL_TABLET | ORAL | Status: AC
  Filled 2014-12-12: qty 1

## 2014-12-12 MED ORDER — ONDANSETRON HCL 8 MG PO TABS
8.0000 mg | ORAL_TABLET | Freq: Once | ORAL | Status: AC
  Administered 2014-12-12: 8 mg via ORAL

## 2014-12-12 MED ORDER — BORTEZOMIB CHEMO SQ INJECTION 3.5 MG (2.5MG/ML)
1.3000 mg/m2 | Freq: Once | INTRAMUSCULAR | Status: AC
  Administered 2014-12-12: 2.5 mg via SUBCUTANEOUS
  Filled 2014-12-12: qty 2.5

## 2014-12-12 NOTE — Progress Notes (Signed)
Ok to treat with prior cmet per [name].

## 2014-12-12 NOTE — Patient Instructions (Signed)
[facility] Discharge Instructions for Patients Receiving Chemotherapy  Today you received the following chemotherapy agents:  Velcade  To help prevent nausea and vomiting after your treatment, we encourage you to take your nausea medication as prescribed.   If you develop nausea and vomiting that is not controlled by your nausea medication, call the clinic.   BELOW ARE SYMPTOMS THAT SHOULD BE REPORTED IMMEDIATELY:  *FEVER GREATER THAN 100.5 F  *CHILLS WITH OR WITHOUT FEVER  NAUSEA AND VOMITING THAT IS NOT CONTROLLED WITH YOUR NAUSEA MEDICATION  *UNUSUAL SHORTNESS OF BREATH  *UNUSUAL BRUISING OR BLEEDING  TENDERNESS IN MOUTH AND THROAT WITH OR WITHOUT PRESENCE OF ULCERS  *URINARY PROBLEMS  *BOWEL PROBLEMS  UNUSUAL RASH Items with * indicate a potential emergency and should be followed up as soon as possible.  Feel free to call the clinic you have any questions or concerns. The clinic phone number is [phone].  Please show the CHEMO ALERT CARD at check-in to the Emergency Department and triage nurse.   

## 2014-12-17 ENCOUNTER — Encounter: Payer: Self-pay | Admitting: Oncology

## 2014-12-17 ENCOUNTER — Other Ambulatory Visit: Payer: Self-pay | Admitting: Oncology

## 2014-12-19 ENCOUNTER — Other Ambulatory Visit: Payer: Self-pay | Admitting: *Deleted

## 2014-12-19 ENCOUNTER — Other Ambulatory Visit (HOSPITAL_BASED_OUTPATIENT_CLINIC_OR_DEPARTMENT_OTHER): Payer: Medicare Other

## 2014-12-19 ENCOUNTER — Ambulatory Visit (HOSPITAL_BASED_OUTPATIENT_CLINIC_OR_DEPARTMENT_OTHER): Payer: Medicare Other

## 2014-12-19 VITALS — BP 132/57 | HR 60 | Temp 98.6°F | Resp 18

## 2014-12-19 DIAGNOSIS — C9000 Multiple myeloma not having achieved remission: Secondary | ICD-10-CM | POA: Diagnosis present

## 2014-12-19 DIAGNOSIS — Z5112 Encounter for antineoplastic immunotherapy: Secondary | ICD-10-CM

## 2014-12-19 LAB — CBC WITH DIFFERENTIAL/PLATELET
BASO%: 0.5 % (ref 0.0–2.0)
Basophils Absolute: 0 10*3/uL (ref 0.0–0.1)
EOS%: 4.1 % (ref 0.0–7.0)
Eosinophils Absolute: 0.2 10*3/uL (ref 0.0–0.5)
HEMATOCRIT: 39.5 % (ref 38.4–49.9)
HGB: 12.6 g/dL — ABNORMAL LOW (ref 13.0–17.1)
LYMPH%: 10.1 % — AB (ref 14.0–49.0)
MCH: 26.2 pg — ABNORMAL LOW (ref 27.2–33.4)
MCHC: 31.8 g/dL — AB (ref 32.0–36.0)
MCV: 82.6 fL (ref 79.3–98.0)
MONO#: 0.3 10*3/uL (ref 0.1–0.9)
MONO%: 6.5 % (ref 0.0–14.0)
NEUT#: 3.2 10*3/uL (ref 1.5–6.5)
NEUT%: 78.8 % — AB (ref 39.0–75.0)
PLATELETS: 114 10*3/uL — AB (ref 140–400)
RBC: 4.78 10*6/uL (ref 4.20–5.82)
RDW: 17.4 % — ABNORMAL HIGH (ref 11.0–14.6)
WBC: 4 10*3/uL (ref 4.0–10.3)
lymph#: 0.4 10*3/uL — ABNORMAL LOW (ref 0.9–3.3)

## 2014-12-19 MED ORDER — BORTEZOMIB CHEMO SQ INJECTION 3.5 MG (2.5MG/ML)
1.3000 mg/m2 | Freq: Once | INTRAMUSCULAR | Status: AC
  Administered 2014-12-19: 2.5 mg via SUBCUTANEOUS
  Filled 2014-12-19: qty 2.5

## 2014-12-19 MED ORDER — DEXAMETHASONE 4 MG PO TABS: 20.0000 mg | ORAL_TABLET | ORAL | Status: DC

## 2014-12-19 MED ORDER — ONDANSETRON HCL 8 MG PO TABS
8.0000 mg | ORAL_TABLET | Freq: Once | ORAL | Status: AC
  Administered 2014-12-19: 8 mg via ORAL

## 2014-12-19 MED ORDER — ONDANSETRON HCL 8 MG PO TABS
ORAL_TABLET | ORAL | Status: AC
Start: 2014-12-19 — End: 2014-12-19
  Filled 2014-12-19: qty 1

## 2014-12-19 NOTE — Patient Instructions (Signed)
[facility] Discharge Instructions for Patients Receiving Chemotherapy  Today you received the following chemotherapy agents velcade To help prevent nausea and vomiting after your treatment, we encourage you to take your nausea medication as prescribed.   If you develop nausea and vomiting that is not controlled by your nausea medication, call the clinic.   BELOW ARE SYMPTOMS THAT SHOULD BE REPORTED IMMEDIATELY:  *FEVER GREATER THAN 100.5 F  *CHILLS WITH OR WITHOUT FEVER  NAUSEA AND VOMITING THAT IS NOT CONTROLLED WITH YOUR NAUSEA MEDICATION  *UNUSUAL SHORTNESS OF BREATH  *UNUSUAL BRUISING OR BLEEDING  TENDERNESS IN MOUTH AND THROAT WITH OR WITHOUT PRESENCE OF ULCERS  *URINARY PROBLEMS  *BOWEL PROBLEMS  UNUSUAL RASH Items with * indicate a potential emergency and should be followed up as soon as possible.  Feel free to call the clinic you have any questions or concerns. The clinic phone number is [phone].  Please show the CHEMO ALERT CARD at check-in to the Emergency Department and triage nurse.   

## 2014-12-19 NOTE — Progress Notes (Signed)
Per Dr Benay Spice it is okay to treat pt today with velcade and chemistry from 3 weeks ago.

## 2014-12-24 ENCOUNTER — Other Ambulatory Visit: Payer: Self-pay | Admitting: Oncology

## 2014-12-25 ENCOUNTER — Other Ambulatory Visit: Payer: Self-pay | Admitting: Oncology

## 2014-12-26 ENCOUNTER — Telehealth: Payer: Self-pay | Admitting: Oncology

## 2014-12-26 ENCOUNTER — Ambulatory Visit (HOSPITAL_BASED_OUTPATIENT_CLINIC_OR_DEPARTMENT_OTHER): Payer: Medicare Other | Admitting: Oncology

## 2014-12-26 ENCOUNTER — Other Ambulatory Visit (HOSPITAL_BASED_OUTPATIENT_CLINIC_OR_DEPARTMENT_OTHER): Payer: Medicare Other

## 2014-12-26 ENCOUNTER — Encounter: Payer: Self-pay | Admitting: Medical Oncology

## 2014-12-26 ENCOUNTER — Ambulatory Visit: Payer: Medicare Other

## 2014-12-26 VITALS — BP 138/54 | HR 61 | Temp 98.4°F | Resp 18 | Ht 71.0 in | Wt 186.3 lb

## 2014-12-26 DIAGNOSIS — C9000 Multiple myeloma not having achieved remission: Secondary | ICD-10-CM

## 2014-12-26 DIAGNOSIS — Z5112 Encounter for antineoplastic immunotherapy: Secondary | ICD-10-CM

## 2014-12-26 LAB — CBC WITH DIFFERENTIAL/PLATELET
BASO%: 0.6 % (ref 0.0–2.0)
BASOS ABS: 0 10*3/uL (ref 0.0–0.1)
EOS ABS: 0.2 10*3/uL (ref 0.0–0.5)
EOS%: 5.4 % (ref 0.0–7.0)
HEMATOCRIT: 39.3 % (ref 38.4–49.9)
HGB: 12.4 g/dL — ABNORMAL LOW (ref 13.0–17.1)
LYMPH%: 13.7 % — AB (ref 14.0–49.0)
MCH: 26.4 pg — ABNORMAL LOW (ref 27.2–33.4)
MCHC: 31.6 g/dL — AB (ref 32.0–36.0)
MCV: 83.6 fL (ref 79.3–98.0)
MONO#: 0.6 10*3/uL (ref 0.1–0.9)
MONO%: 17.9 % — ABNORMAL HIGH (ref 0.0–14.0)
NEUT%: 62.4 % (ref 39.0–75.0)
NEUTROS ABS: 2.1 10*3/uL (ref 1.5–6.5)
PLATELETS: 83 10*3/uL — AB (ref 140–400)
RBC: 4.7 10*6/uL (ref 4.20–5.82)
RDW: 15.8 % — ABNORMAL HIGH (ref 11.0–14.6)
WBC: 3.4 10*3/uL — AB (ref 4.0–10.3)
lymph#: 0.5 10*3/uL — ABNORMAL LOW (ref 0.9–3.3)
nRBC: 0 % (ref 0–0)

## 2014-12-26 LAB — COMPREHENSIVE METABOLIC PANEL (CC13)
ALBUMIN: 3.2 g/dL — AB (ref 3.5–5.0)
ALK PHOS: 44 U/L (ref 40–150)
ALT: 11 U/L (ref 0–55)
ANION GAP: 7 meq/L (ref 3–11)
AST: 14 U/L (ref 5–34)
BILIRUBIN TOTAL: 0.53 mg/dL (ref 0.20–1.20)
BUN: 6.4 mg/dL — ABNORMAL LOW (ref 7.0–26.0)
CALCIUM: 8.8 mg/dL (ref 8.4–10.4)
CO2: 28 mEq/L (ref 22–29)
CREATININE: 0.7 mg/dL (ref 0.7–1.3)
Chloride: 109 mEq/L (ref 98–109)
EGFR: 88 mL/min/{1.73_m2} — AB (ref >90)
Glucose: 87 mg/dl (ref 70–140)
Potassium: 3.5 mEq/L (ref 3.5–5.1)
Sodium: 144 mEq/L (ref 136–145)
TOTAL PROTEIN: 5.7 g/dL — AB (ref 6.4–8.3)

## 2014-12-26 MED ORDER — BORTEZOMIB CHEMO SQ INJECTION 3.5 MG (2.5MG/ML)
1.3000 mg/m2 | Freq: Once | INTRAMUSCULAR | Status: AC
  Administered 2014-12-26: 2.5 mg via SUBCUTANEOUS
  Filled 2014-12-26: qty 2.5

## 2014-12-26 MED ORDER — ONDANSETRON HCL 8 MG PO TABS
8.0000 mg | ORAL_TABLET | Freq: Once | ORAL | Status: AC
  Administered 2014-12-26: 8 mg via ORAL

## 2014-12-26 MED ORDER — ONDANSETRON HCL 8 MG PO TABS
ORAL_TABLET | ORAL | Status: AC
  Filled 2014-12-26: qty 1

## 2014-12-26 NOTE — Patient Instructions (Signed)
[facility] Discharge Instructions for Patients Receiving Chemotherapy  Today you received the following chemotherapy agents Velcade. To help prevent nausea and vomiting after your treatment, we encourage you to take your nausea medication as directed.  If you develop nausea and vomiting that is not controlled by your nausea medication, call the clinic.   BELOW ARE SYMPTOMS THAT SHOULD BE REPORTED IMMEDIATELY:  *FEVER GREATER THAN 100.5 F  *CHILLS WITH OR WITHOUT FEVER  NAUSEA AND VOMITING THAT IS NOT CONTROLLED WITH YOUR NAUSEA MEDICATION  *UNUSUAL SHORTNESS OF BREATH  *UNUSUAL BRUISING OR BLEEDING  TENDERNESS IN MOUTH AND THROAT WITH OR WITHOUT PRESENCE OF ULCERS  *URINARY PROBLEMS  *BOWEL PROBLEMS  UNUSUAL RASH Items with * indicate a potential emergency and should be followed up as soon as possible.  Feel free to call the clinic you have any questions or concerns. The clinic phone number is [phone].  Please show the CHEMO ALERT CARD at check-in to the Emergency Department and triage nurse.    

## 2014-12-26 NOTE — Progress Notes (Signed)
Labs reviewed by [name]; Faythe Ghee to proceed with treatment today with platelets 83

## 2014-12-26 NOTE — Telephone Encounter (Signed)
per pof to sch pt appt-gave pt copy of avs-per pt to sch labs prior to inj-pt req

## 2014-12-26 NOTE — Progress Notes (Signed)
  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 He continues RVD. He completed the most recent cycle of Revlimid yesterday. He takes Decadron weekly. He had noted significant improvement in his energy level a few weeks ago. He has been exercising. For the past week he has noted decreased endurance. No fever. No other new symptoms. He continues self catheterization 3-4 times daily for the urinary retention.  Objective:  Vital signs in last 24 hours:  Blood pressure 138/54, pulse 61, temperature 98.4 F (36.9 C), temperature source Oral, resp. rate 18, height $RemoveBe'5\' 11"'qZVVaaTok$  (1.803 m), weight 186 lb 4.8 oz (84.505 kg), SpO2 97 %.    HEENT: No thrush, mild cushingoid change Resp: Lungs clear bilaterally Cardio: Regular rate and rhythm GI: No hepatomegaly, nontender Vascular: Trace pitting edema at the left greater than right lower leg   Lab Results:  Lab Results  Component Value Date   WBC 3.4* 12/26/2014   HGB 12.4* 12/26/2014   HCT 39.3 12/26/2014   MCV 83.6 12/26/2014   PLT 83* 12/26/2014   NEUTROABS 2.1 12/26/2014     Medications: I have reviewed the patient's current medications.  Assessment/Plan: 1. Multiple myeloma-confirmed on a bone marrow biopsy 08/07/2014, IgA lambda  Serum M spike and increased serum free lambda light chains  Myeloma FISH panel negative for chromosome 4, 11, 12, 13, 14, and 17 abnormalities, cytogenetics with no metaphases  Bone survey 08/15/2014 with indeterminant lucent skull lesions  Cycle 1 RVD 08/22/2014  Cycle 2 RVD 09/19/2014  Cycle 3 RVD 10/17/2014  Cycle 4 RVD 11/14/2014 (Decadron reduced to 20 mg weekly, Revlimid 15 mg days 1 through 14, Velcade 3/4 weeks)  Cycle 5 RVD 12/12/2014  2. 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 History of intermittent prostatitis  4. Skin rash 10/24/2014-referred to dermatology, biopsy consistent with local hypersensitivity reaction  5. 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 The plan is to begin cycle 6 on 01/09/2015. He will switch to maintenance Revlimid after cycle 6 if the IgA level and serum M spike show continued improvement on labs from today.  The malaise is likely related to toxicity from the course of RVD therapy. Hopefully his symptoms will improve on maintenance therapy. He will contact us for increased malaise or new symptoms prior to the next office visit on 01/23/2015.  [name], MD  12/26/2014  2:06 PM

## 2014-12-26 NOTE — Patient Instructions (Signed)
[facility] Discharge Instructions for Patients Receiving Chemotherapy  Today you received the following chemotherapy agents Velcade. To help prevent nausea and vomiting after your treatment, we encourage you to take your nausea medication as directed.  If you develop nausea and vomiting that is not controlled by your nausea medication, call the clinic.   BELOW ARE SYMPTOMS THAT SHOULD BE REPORTED IMMEDIATELY:  *FEVER GREATER THAN 100.5 F  *CHILLS WITH OR WITHOUT FEVER  NAUSEA AND VOMITING THAT IS NOT CONTROLLED WITH YOUR NAUSEA MEDICATION  *UNUSUAL SHORTNESS OF BREATH  *UNUSUAL BRUISING OR BLEEDING  TENDERNESS IN MOUTH AND THROAT WITH OR WITHOUT PRESENCE OF ULCERS  *URINARY PROBLEMS  *BOWEL PROBLEMS  UNUSUAL RASH Items with * indicate a potential emergency and should be followed up as soon as possible.  Feel free to call the clinic you have any questions or concerns. The clinic phone number is [phone].  Please show the CHEMO ALERT CARD at check-in to the Emergency Department and triage nurse.    

## 2014-12-26 NOTE — Progress Notes (Signed)
Oncology Nurse Navigator Documentation  Oncology Nurse Navigator Flowsheets 08/15/2014 11/28/2014 12/26/2014  Navigator Encounter Type Other 3 month 6 month- Mr. Dettling is doing well 6 months post radiation. He had some trouble with not being able to empty his bladder and followed up with Dr. Dwan Bolt. This issue is slowly improving with medication. He is under treatment for myeloma and being followed by [name].  Patient Visit Type Follow-up Medonc -  Barriers/Navigation Needs - No barriers at this time No barriers at this time  Support Groups/Services - Friends and Family Friends and Family  Time Spent with Patient [phone]

## 2014-12-27 ENCOUNTER — Other Ambulatory Visit: Payer: Self-pay | Admitting: *Deleted

## 2014-12-27 MED ORDER — LENALIDOMIDE 15 MG PO CAPS: 15.0000 mg | ORAL_CAPSULE | Freq: Every day | ORAL | Status: DC

## 2014-12-28 LAB — PROTEIN ELECTROPHORESIS, SERUM
Albumin ELP: 3.2 g/dL — ABNORMAL LOW (ref 3.8–4.8)
Alpha-1-Globulin: 0.3 g/dL (ref 0.2–0.3)
Alpha-2-Globulin: 0.8 g/dL (ref 0.5–0.9)
Beta 2: 0.2 g/dL (ref 0.2–0.5)
Beta Globulin: 0.4 g/dL (ref 0.4–0.6)
Gamma Globulin: 0.4 g/dL — ABNORMAL LOW (ref 0.8–1.7)
Total Protein, Serum Electrophoresis: 5.3 g/dL — ABNORMAL LOW (ref 6.1–8.1)

## 2014-12-28 LAB — KAPPA/LAMBDA LIGHT CHAINS
KAPPA LAMBDA RATIO: 1.23 (ref 0.26–1.65)
Kappa free light chain: 1.65 mg/dL (ref 0.33–1.94)
LAMBDA FREE LGHT CHN: 1.34 mg/dL (ref 0.57–2.63)

## 2014-12-28 LAB — IGA: IgA: 56 mg/dL — ABNORMAL LOW (ref 68–379)

## 2014-12-29 ENCOUNTER — Telehealth: Payer: Self-pay | Admitting: *Deleted

## 2014-12-29 DIAGNOSIS — C61 Malignant neoplasm of prostate: Secondary | ICD-10-CM | POA: Diagnosis not present

## 2014-12-29 NOTE — Telephone Encounter (Signed)
Per [name]; notified pt that myeloma protein not detected, IgA level is normal and plan to switch to maintenance therapy after next cycle as discussed.  Pt verbalized understanding and expressed appreciation.

## 2014-12-29 NOTE — Telephone Encounter (Signed)
-----   Message from Ladell Pier, MD sent at 12/28/2014  5:13 PM EST ----- Please call patient, myeloma protein not detected, IgA level is normal Plan to switch to maintenance therapy after next cycle as discussed

## 2015-01-05 DIAGNOSIS — C61 Malignant neoplasm of prostate: Secondary | ICD-10-CM | POA: Diagnosis not present

## 2015-01-05 DIAGNOSIS — N320 Bladder-neck obstruction: Secondary | ICD-10-CM | POA: Diagnosis not present

## 2015-01-05 DIAGNOSIS — N390 Urinary tract infection, site not specified: Secondary | ICD-10-CM | POA: Diagnosis not present

## 2015-01-07 ENCOUNTER — Other Ambulatory Visit: Payer: Self-pay | Admitting: Oncology

## 2015-01-08 ENCOUNTER — Telehealth: Payer: Self-pay | Admitting: Oncology

## 2015-01-08 NOTE — Telephone Encounter (Signed)
appts already corrected from inj to infusions

## 2015-01-09 ENCOUNTER — Other Ambulatory Visit: Payer: Self-pay | Admitting: *Deleted

## 2015-01-09 ENCOUNTER — Other Ambulatory Visit (HOSPITAL_BASED_OUTPATIENT_CLINIC_OR_DEPARTMENT_OTHER): Payer: Medicare Other

## 2015-01-09 ENCOUNTER — Ambulatory Visit (HOSPITAL_BASED_OUTPATIENT_CLINIC_OR_DEPARTMENT_OTHER): Payer: Medicare Other | Admitting: Nurse Practitioner

## 2015-01-09 ENCOUNTER — Ambulatory Visit (HOSPITAL_BASED_OUTPATIENT_CLINIC_OR_DEPARTMENT_OTHER): Payer: Medicare Other

## 2015-01-09 VITALS — BP 145/71 | HR 79 | Temp 98.1°F | Resp 18

## 2015-01-09 DIAGNOSIS — N3000 Acute cystitis without hematuria: Secondary | ICD-10-CM

## 2015-01-09 DIAGNOSIS — N390 Urinary tract infection, site not specified: Secondary | ICD-10-CM

## 2015-01-09 DIAGNOSIS — Z8546 Personal history of malignant neoplasm of prostate: Secondary | ICD-10-CM

## 2015-01-09 DIAGNOSIS — C9000 Multiple myeloma not having achieved remission: Secondary | ICD-10-CM

## 2015-01-09 DIAGNOSIS — R300 Dysuria: Secondary | ICD-10-CM

## 2015-01-09 DIAGNOSIS — Z5112 Encounter for antineoplastic immunotherapy: Secondary | ICD-10-CM

## 2015-01-09 DIAGNOSIS — R319 Hematuria, unspecified: Secondary | ICD-10-CM

## 2015-01-09 LAB — CBC WITH DIFFERENTIAL/PLATELET
BASO%: 1.2 % (ref 0.0–2.0)
Basophils Absolute: 0 10*3/uL (ref 0.0–0.1)
EOS%: 3.3 % (ref 0.0–7.0)
Eosinophils Absolute: 0.1 10*3/uL (ref 0.0–0.5)
HEMATOCRIT: 40.2 % (ref 38.4–49.9)
HEMOGLOBIN: 12.5 g/dL — AB (ref 13.0–17.1)
LYMPH#: 0.4 10*3/uL — AB (ref 0.9–3.3)
LYMPH%: 11.7 % — ABNORMAL LOW (ref 14.0–49.0)
MCH: 25.7 pg — ABNORMAL LOW (ref 27.2–33.4)
MCHC: 31 g/dL — AB (ref 32.0–36.0)
MCV: 82.7 fL (ref 79.3–98.0)
MONO#: 0.4 10*3/uL (ref 0.1–0.9)
MONO%: 9.5 % (ref 0.0–14.0)
NEUT%: 74.3 % (ref 39.0–75.0)
NEUTROS ABS: 2.8 10*3/uL (ref 1.5–6.5)
PLATELETS: 188 10*3/uL (ref 140–400)
RBC: 4.86 10*6/uL (ref 4.20–5.82)
RDW: 17.2 % — AB (ref 11.0–14.6)
WBC: 3.7 10*3/uL — ABNORMAL LOW (ref 4.0–10.3)

## 2015-01-09 LAB — URINALYSIS, MICROSCOPIC - CHCC
BILIRUBIN (URINE): NEGATIVE
Glucose: NEGATIVE mg/dL
KETONES: NEGATIVE mg/dL
NITRITE: NEGATIVE
Protein: 300 mg/dL
Specific Gravity, Urine: 1.02 (ref 1.003–1.035)
Urobilinogen, UR: 0.2 mg/dL (ref 0.2–1)
pH: 6.5 (ref 4.6–8.0)

## 2015-01-09 MED ORDER — CIPROFLOXACIN HCL 500 MG PO TABS: 500.0000 mg | ORAL_TABLET | Freq: Two times a day (BID) | ORAL | Status: DC

## 2015-01-09 MED ORDER — ONDANSETRON HCL 8 MG PO TABS
ORAL_TABLET | ORAL | Status: AC
  Filled 2015-01-09: qty 1

## 2015-01-09 MED ORDER — BORTEZOMIB CHEMO SQ INJECTION 3.5 MG (2.5MG/ML)
1.3000 mg/m2 | Freq: Once | INTRAMUSCULAR | Status: AC
  Administered 2015-01-09: 2.5 mg via SUBCUTANEOUS
  Filled 2015-01-09: qty 2.5

## 2015-01-09 MED ORDER — ONDANSETRON HCL 8 MG PO TABS
8.0000 mg | ORAL_TABLET | Freq: Once | ORAL | Status: AC
  Administered 2015-01-09: 8 mg via ORAL

## 2015-01-09 NOTE — Progress Notes (Signed)
UA results reported to [name]. Order for Urine cx and antibiotic received.

## 2015-01-09 NOTE — Progress Notes (Signed)
Ok to proceed with chemo without CMET per [name].

## 2015-01-09 NOTE — Progress Notes (Signed)
Patient was at urologist on the 9th and provided am urine sample. Urologist called patient on 01/07/15 to let pt know that his urine sample was clear and no sign of infection. Patient reports frequenct urination over the weekend. Pt provided urine sample which appears cloudy. Tanya RN/[name] made aware.  0911: Urine sample walked over to Lab.

## 2015-01-09 NOTE — Patient Instructions (Signed)
[facility] Discharge Instructions for Patients Receiving Chemotherapy  Today you received the following chemotherapy agents Velcade. To help prevent nausea and vomiting after your treatment, we encourage you to take your nausea medication as directed.  If you develop nausea and vomiting that is not controlled by your nausea medication, call the clinic.   BELOW ARE SYMPTOMS THAT SHOULD BE REPORTED IMMEDIATELY:  *FEVER GREATER THAN 100.5 F  *CHILLS WITH OR WITHOUT FEVER  NAUSEA AND VOMITING THAT IS NOT CONTROLLED WITH YOUR NAUSEA MEDICATION  *UNUSUAL SHORTNESS OF BREATH  *UNUSUAL BRUISING OR BLEEDING  TENDERNESS IN MOUTH AND THROAT WITH OR WITHOUT PRESENCE OF ULCERS  *URINARY PROBLEMS  *BOWEL PROBLEMS  UNUSUAL RASH Items with * indicate a potential emergency and should be followed up as soon as possible.  Feel free to call the clinic you have any questions or concerns. The clinic phone number is [phone].  Please show the CHEMO ALERT CARD at check-in to the Emergency Department and triage nurse.    

## 2015-01-10 ENCOUNTER — Encounter: Payer: Self-pay | Admitting: Nurse Practitioner

## 2015-01-10 LAB — URINE CULTURE

## 2015-01-10 NOTE — Assessment & Plan Note (Signed)
Patient presented to the [facility] [facility] today for cycle 16 of his Velcade injections.  Patient is reporting symptoms of a urinary tract infection today.  He will be treated with Cipro antibiotics.  Blood counts obtained today were within normal limits.  Patient is scheduled to return for labs and his next Velcade injectionon 01/16/2015.

## 2015-01-10 NOTE — Progress Notes (Signed)
SYMPTOM MANAGEMENT CLINIC   HPI: Mitchell Lowery 80 y.o. male diagnosed with prostate cancer in multiple myeloma.  Currently undergoing Velcade injections.  Patient has a history of past a cancer.  He self caths himself between 3 and 6 times per day on a chronic basis.  He is followed by Dr. Mar Daring at [facility] urology.  Patient reports that he exercises urologist this past Friday, 01/05/2015.  Patient is now complaining of increased urgency, pressure, and frequency of urination.  He denies any dysuria.  He also denies any recent fevers or chills.  Urinalysis obtained today did reveal a large blood, nitrite negative, small amount leukocyte esterase, RBCs 11-20, the PVCs too numerous to count, and a moderate amount of bacteria.  Patient will be prescribed Cipro antibiotics for treatment of UTI.  Advised patient that he should follow-up with his urologist in regards to his urinary tract infection symptoms.  Also, patient was advised to call/return visit to the emergency department for any worsening symptoms whatsoever.    HPI  ROS  Past Medical History  Diagnosis Date  . Prostate cancer ([place])   . Prostatitis   . Anal fistula     treated in [place]    Past Surgical History  Procedure Laterality Date  . Prostate biopsy    . Prostate biopsy    . Anal fistulectomy    . Ankle surgery    . Radioactive seed implant N/A 07/21/2014    Procedure: RADIOACTIVE SEED IMPLANT/BRACHYTHERAPY IMPLANT;  Surgeon: Franchot Gallo, MD;  Location: [facility];  Service: Urology;  Laterality: N/A;    has Malignant neoplasm of prostate ([place]); Multiple myeloma ([place]); and Infection of urinary tract on his problem list.    has No Known Allergies.    Medication List       This list is accurate as of: 01/09/15 11:59 PM.  Always use your most recent med list.               acyclovir 400 MG tablet  Commonly known as:  ZOVIRAX  Take 1 tablet (400 mg total) by mouth 2 (two)  times daily.     aspirin 81 MG tablet  Take 1 tablet (81 mg total) by mouth daily.     ciprofloxacin 500 MG tablet  Commonly known as:  CIPRO  Take 1 tablet (500 mg total) by mouth 2 (two) times daily.     dexamethasone 4 MG tablet  Commonly known as:  DECADRON  Take 5 tablets (20 mg total) by mouth once a week. Take before chemo appt     HYDROcodone-acetaminophen 5-325 MG tablet  Commonly known as:  NORCO  Take 1-2 tablets by mouth every 4 (four) hours as needed for moderate pain.     lenalidomide 15 MG capsule  Commonly known as:  REVLIMID  Take 1 capsule (15 mg total) by mouth daily. For 14 days then 14 days off.     MYRBETRIQ PO  Take by mouth daily. 3 week sample given by urologist     PRESCRIPTION MEDICATION  Inject 1 each into the skin every 3 (three) months. ADT SEE CHART--Anti-testorone.         PHYSICAL EXAMINATION  Oncology Vitals 01/09/2015 12/26/2014  Height - 180 cm  Weight - 84.505 kg  Weight (lbs) - 186 lbs 5 oz  BMI (kg/m2) - 25.98 kg/m2  Temp 98.1 98.4  Pulse 79 61  Resp 18 18  SpO2 96 97  BSA (m2) - 2.06 m2  BP Readings from Last 2 Encounters:  01/09/15 145/71  12/26/14 138/54    Physical Exam  Constitutional: He is oriented to person, place, and time and well-developed, well-nourished, and in no distress.  HENT:  Head: Normocephalic and atraumatic.  Eyes: Conjunctivae and EOM are normal. Pupils are equal, round, and reactive to light. Right eye exhibits no discharge. Left eye exhibits no discharge. No scleral icterus.  Neck: Normal range of motion.  Pulmonary/Chest: Effort normal. No respiratory distress.  Neurological: He is alert and oriented to person, place, and time.  Psychiatric: Affect normal.  Nursing note and vitals reviewed.   LABORATORY DATA:. Orders Only on 01/09/2015  Component Date Value Ref Range Status  . Urine Culture, Routine 01/09/2015 Culture, Urine   Final   Comment: Final - ===== COLONY COUNT: ===== NO  GROWTH NO GROWTH   Infusion on 01/09/2015  Component Date Value Ref Range Status  . Glucose 01/09/2015 Negative  Negative mg/dL Final  . Bilirubin (Urine) 01/09/2015 Negative  Negative Final  . Ketones 01/09/2015 Negative  Negative mg/dL Final  . Specific Gravity, Urine 01/09/2015 1.020  1.003 - 1.035 Final  . Blood 01/09/2015 Large  Negative Final  . pH 01/09/2015 6.5  4.6 - 8.0 Final  . Protein 01/09/2015 300  Negative- <30 mg/dL Final  . Urobilinogen, UR 01/09/2015 0.2  0.2 - 1 mg/dL Final  . Nitrite 01/09/2015 Negative  Negative Final  . Leukocyte Esterase 01/09/2015 Small  Negative Final  . RBC / HPF 01/09/2015 11-20  0 - 2 Final  . WBC, UA 01/09/2015 TNTC  0 - 2 Final  . Bacteria, UA 01/09/2015 Moderate  Negative- Trace Final  Appointment on 01/09/2015  Component Date Value Ref Range Status  . WBC 01/09/2015 3.7* 4.0 - 10.3 10e3/uL Final  . NEUT# 01/09/2015 2.8  1.5 - 6.5 10e3/uL Final  . HGB 01/09/2015 12.5* 13.0 - 17.1 g/dL Final  . HCT 01/09/2015 40.2  38.4 - 49.9 % Final  . Platelets 01/09/2015 188  140 - 400 10e3/uL Final  . MCV 01/09/2015 82.7  79.3 - 98.0 fL Final  . MCH 01/09/2015 25.7* 27.2 - 33.4 pg Final  . MCHC 01/09/2015 31.0* 32.0 - 36.0 g/dL Final  . RBC 01/09/2015 4.86  4.20 - 5.82 10e6/uL Final  . RDW 01/09/2015 17.2* 11.0 - 14.6 % Final  . lymph# 01/09/2015 0.4* 0.9 - 3.3 10e3/uL Final  . MONO# 01/09/2015 0.4  0.1 - 0.9 10e3/uL Final  . Eosinophils Absolute 01/09/2015 0.1  0.0 - 0.5 10e3/uL Final  . Basophils Absolute 01/09/2015 0.0  0.0 - 0.1 10e3/uL Final  . NEUT% 01/09/2015 74.3  39.0 - 75.0 % Final  . LYMPH% 01/09/2015 11.7* 14.0 - 49.0 % Final  . MONO% 01/09/2015 9.5  0.0 - 14.0 % Final  . EOS% 01/09/2015 3.3  0.0 - 7.0 % Final  . BASO% 01/09/2015 1.2  0.0 - 2.0 % Final     RADIOGRAPHIC STUDIES: No results found.  ASSESSMENT/PLAN:    Multiple myeloma [facility]) Patient presented to the [facility] today for cycle 16 of his Velcade injections.   Patient is reporting symptoms of a urinary tract infection today.  He will be treated with Cipro antibiotics.  Blood counts obtained today were within normal limits.  Patient is scheduled to return for labs and his next Velcade injectionon 01/16/2015.  Infection of urinary tract Patient has a history of past a cancer.  He self caths himself between 3 and 6 times per day on  a chronic basis.  He is followed by Dr. Mar Daring at [facility] urology.  Patient reports that he exercises urologist this past Friday, 01/05/2015.  Patient is now complaining of increased urgency, pressure, and frequency of urination.  He denies any dysuria.  He also denies any recent fevers or chills.  Urinalysis obtained today did reveal a large blood, nitrite negative, small amount leukocyte esterase, RBCs 11-20, the PVCs too numerous to count, and a moderate amount of bacteria.  Patient will be prescribed Cipro antibiotics for treatment of UTI.  Advised patient that he should follow-up with his urologist in regards to his urinary tract infection symptoms.  Also, patient was advised to call/return visit to the emergency department for any worsening symptoms whatsoever.  Patient stated understanding of all instructions; and was in agreement with this plan of care. The patient knows to call the clinic with any problems, questions or concerns.   Review/collaboration with [name] regarding all aspects of patient's visit today.   Total time spent with patient was 25 minutes;  with greater than 75 percent of that time spent in face to face counseling regarding patient's symptoms,  and coordination of care and follow up.  Disclaimer:This dictation was prepared with Dragon/digital dictation along with [employer]. Any transcriptional errors that result from this process are unintentional.  Drue Second, NP 01/10/2015

## 2015-01-10 NOTE — Assessment & Plan Note (Signed)
Patient has a history of past a cancer.  He self caths himself between 3 and 6 times per day on a chronic basis.  He is followed by Dr. Mar Daring at [facility] urology.  Patient reports that he exercises urologist this past Friday, 01/05/2015.  Patient is now complaining of increased urgency, pressure, and frequency of urination.  He denies any dysuria.  He also denies any recent fevers or chills.  Urinalysis obtained today did reveal a large blood, nitrite negative, small amount leukocyte esterase, RBCs 11-20, the PVCs too numerous to count, and a moderate amount of bacteria.  Patient will be prescribed Cipro antibiotics for treatment of UTI.  Advised patient that he should follow-up with his urologist in regards to his urinary tract infection symptoms.  Also, patient was advised to call/return visit to the emergency department for any worsening symptoms whatsoever.

## 2015-01-16 ENCOUNTER — Ambulatory Visit (HOSPITAL_BASED_OUTPATIENT_CLINIC_OR_DEPARTMENT_OTHER): Payer: Medicare Other

## 2015-01-16 ENCOUNTER — Other Ambulatory Visit (HOSPITAL_BASED_OUTPATIENT_CLINIC_OR_DEPARTMENT_OTHER): Payer: Medicare Other

## 2015-01-16 VITALS — BP 150/62 | HR 62 | Temp 98.2°F | Resp 16

## 2015-01-16 DIAGNOSIS — Z5112 Encounter for antineoplastic immunotherapy: Secondary | ICD-10-CM | POA: Diagnosis present

## 2015-01-16 DIAGNOSIS — C9000 Multiple myeloma not having achieved remission: Secondary | ICD-10-CM

## 2015-01-16 LAB — CBC WITH DIFFERENTIAL/PLATELET
BASO%: 0.9 % (ref 0.0–2.0)
Basophils Absolute: 0 10*3/uL (ref 0.0–0.1)
EOS%: 4.5 % (ref 0.0–7.0)
Eosinophils Absolute: 0.2 10*3/uL (ref 0.0–0.5)
HCT: 38.9 % (ref 38.4–49.9)
HGB: 12.2 g/dL — ABNORMAL LOW (ref 13.0–17.1)
LYMPH%: 14.2 % (ref 14.0–49.0)
MCH: 25.6 pg — ABNORMAL LOW (ref 27.2–33.4)
MCHC: 31.4 g/dL — AB (ref 32.0–36.0)
MCV: 81.5 fL (ref 79.3–98.0)
MONO#: 0.2 10*3/uL (ref 0.1–0.9)
MONO%: 7.3 % (ref 0.0–14.0)
NEUT#: 2.5 10*3/uL (ref 1.5–6.5)
NEUT%: 73.1 % (ref 39.0–75.0)
PLATELETS: 116 10*3/uL — AB (ref 140–400)
RBC: 4.77 10*6/uL (ref 4.20–5.82)
RDW: 17.3 % — ABNORMAL HIGH (ref 11.0–14.6)
WBC: 3.4 10*3/uL — AB (ref 4.0–10.3)
lymph#: 0.5 10*3/uL — ABNORMAL LOW (ref 0.9–3.3)

## 2015-01-16 MED ORDER — ONDANSETRON HCL 8 MG PO TABS
8.0000 mg | ORAL_TABLET | Freq: Once | ORAL | Status: AC
  Administered 2015-01-16: 8 mg via ORAL

## 2015-01-16 MED ORDER — BORTEZOMIB CHEMO SQ INJECTION 3.5 MG (2.5MG/ML)
1.3000 mg/m2 | Freq: Once | INTRAMUSCULAR | Status: AC
  Administered 2015-01-16: 2.5 mg via SUBCUTANEOUS
  Filled 2015-01-16: qty 2.5

## 2015-01-16 MED ORDER — ONDANSETRON HCL 8 MG PO TABS
ORAL_TABLET | ORAL | Status: AC
  Filled 2015-01-16: qty 1

## 2015-01-16 NOTE — Progress Notes (Signed)
No need to do CMET today for treatment VO [name] - Amy Horton RN-Tammi Matthew Saras RN

## 2015-01-16 NOTE — Patient Instructions (Signed)
[facility] Discharge Instructions for Patients Receiving Chemotherapy  Today you received the following chemotherapy agents Velcade. To help prevent nausea and vomiting after your treatment, we encourage you to take your nausea medication as directed.  If you develop nausea and vomiting that is not controlled by your nausea medication, call the clinic.   BELOW ARE SYMPTOMS THAT SHOULD BE REPORTED IMMEDIATELY:  *FEVER GREATER THAN 100.5 F  *CHILLS WITH OR WITHOUT FEVER  NAUSEA AND VOMITING THAT IS NOT CONTROLLED WITH YOUR NAUSEA MEDICATION  *UNUSUAL SHORTNESS OF BREATH  *UNUSUAL BRUISING OR BLEEDING  TENDERNESS IN MOUTH AND THROAT WITH OR WITHOUT PRESENCE OF ULCERS  *URINARY PROBLEMS  *BOWEL PROBLEMS  UNUSUAL RASH Items with * indicate a potential emergency and should be followed up as soon as possible.  Feel free to call the clinic you have any questions or concerns. The clinic phone number is [phone].  Please show the CHEMO ALERT CARD at check-in to the Emergency Department and triage nurse.    

## 2015-01-19 ENCOUNTER — Other Ambulatory Visit: Payer: Self-pay | Admitting: Oncology

## 2015-01-21 ENCOUNTER — Other Ambulatory Visit: Payer: Self-pay | Admitting: Oncology

## 2015-01-23 ENCOUNTER — Other Ambulatory Visit (HOSPITAL_BASED_OUTPATIENT_CLINIC_OR_DEPARTMENT_OTHER): Payer: Medicare Other

## 2015-01-23 ENCOUNTER — Ambulatory Visit (HOSPITAL_BASED_OUTPATIENT_CLINIC_OR_DEPARTMENT_OTHER): Payer: Medicare Other

## 2015-01-23 ENCOUNTER — Ambulatory Visit (HOSPITAL_BASED_OUTPATIENT_CLINIC_OR_DEPARTMENT_OTHER): Payer: Medicare Other | Admitting: Oncology

## 2015-01-23 VITALS — BP 130/60 | HR 62 | Temp 98.6°F | Resp 18 | Ht 71.0 in | Wt 186.7 lb

## 2015-01-23 DIAGNOSIS — Z5112 Encounter for antineoplastic immunotherapy: Secondary | ICD-10-CM | POA: Diagnosis present

## 2015-01-23 DIAGNOSIS — C61 Malignant neoplasm of prostate: Secondary | ICD-10-CM

## 2015-01-23 DIAGNOSIS — C9001 Multiple myeloma in remission: Secondary | ICD-10-CM

## 2015-01-23 DIAGNOSIS — C9000 Multiple myeloma not having achieved remission: Secondary | ICD-10-CM

## 2015-01-23 DIAGNOSIS — D649 Anemia, unspecified: Secondary | ICD-10-CM | POA: Diagnosis not present

## 2015-01-23 LAB — CBC WITH DIFFERENTIAL/PLATELET
BASO%: 0.4 % (ref 0.0–2.0)
Basophils Absolute: 0 10*3/uL (ref 0.0–0.1)
EOS%: 4.9 % (ref 0.0–7.0)
Eosinophils Absolute: 0.2 10*3/uL (ref 0.0–0.5)
HEMATOCRIT: 38.3 % — AB (ref 38.4–49.9)
HGB: 12.1 g/dL — ABNORMAL LOW (ref 13.0–17.1)
LYMPH#: 0.4 10*3/uL — AB (ref 0.9–3.3)
LYMPH%: 10.2 % — AB (ref 14.0–49.0)
MCH: 26 pg — ABNORMAL LOW (ref 27.2–33.4)
MCHC: 31.6 g/dL — AB (ref 32.0–36.0)
MCV: 82.1 fL (ref 79.3–98.0)
MONO#: 0.7 10*3/uL (ref 0.1–0.9)
MONO%: 16.5 % — AB (ref 0.0–14.0)
NEUT#: 2.8 10*3/uL (ref 1.5–6.5)
NEUT%: 68 % (ref 39.0–75.0)
Platelets: 94 10*3/uL — ABNORMAL LOW (ref 140–400)
RBC: 4.67 10*6/uL (ref 4.20–5.82)
RDW: 17.3 % — ABNORMAL HIGH (ref 11.0–14.6)
WBC: 4.2 10*3/uL (ref 4.0–10.3)

## 2015-01-23 LAB — BASIC METABOLIC PANEL
ANION GAP: 8 meq/L (ref 3–11)
BUN: 8.7 mg/dL (ref 7.0–26.0)
CHLORIDE: 107 meq/L (ref 98–109)
CO2: 29 mEq/L (ref 22–29)
Calcium: 8.7 mg/dL (ref 8.4–10.4)
Creatinine: 0.7 mg/dL (ref 0.7–1.3)
EGFR: 88 mL/min/{1.73_m2} — ABNORMAL LOW (ref >90)
Glucose: 83 mg/dl (ref 70–140)
POTASSIUM: 3.7 meq/L (ref 3.5–5.1)
SODIUM: 144 meq/L (ref 136–145)

## 2015-01-23 MED ORDER — LENALIDOMIDE 10 MG PO CAPS: 10.0000 mg | ORAL_CAPSULE | Freq: Every day | ORAL | Status: DC

## 2015-01-23 MED ORDER — ONDANSETRON HCL 8 MG PO TABS
8.0000 mg | ORAL_TABLET | Freq: Once | ORAL | Status: AC
  Administered 2015-01-23: 8 mg via ORAL

## 2015-01-23 MED ORDER — BORTEZOMIB CHEMO SQ INJECTION 3.5 MG (2.5MG/ML)
1.3000 mg/m2 | Freq: Once | INTRAMUSCULAR | Status: AC
  Administered 2015-01-23: 2.5 mg via SUBCUTANEOUS
  Filled 2015-01-23: qty 2.5

## 2015-01-23 MED ORDER — ONDANSETRON HCL 8 MG PO TABS
ORAL_TABLET | ORAL | Status: AC
  Filled 2015-01-23: qty 1

## 2015-01-23 NOTE — Progress Notes (Signed)
  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 He began cycle 6 RVD on 01/09/2015. He reports increased difficulty with exercising. He continues to perform self catheterization approximate 4 times per day. He is scheduled for bladder manometry in the next few weeks.  The lower legs are swollen. This improves at night.  Objective:  Vital signs in last 24 hours:  Blood pressure 130/60, pulse 62, temperature 98.6 F (37 C), temperature source Oral, resp. rate 18, height '5\' 11"'$  (1.803 m), weight 186 lb 11.2 oz (84.687 kg), SpO2 98 %.    HEENT: No thrush, mild cushingoid changes of the face Resp: Lungs clear bilaterally Cardio: Regular rate and rhythm GI: No hepatosplenomegaly Vascular: Pitting edema at the left greater than right leg below the knee. No erythema or palpable cord.  Lab Results:  Lab Results  Component Value Date   WBC 4.2 01/23/2015   HGB 12.1* 01/23/2015   HCT 38.3* 01/23/2015   MCV 82.1 01/23/2015   PLT 94* 01/23/2015   NEUTROABS 2.8 01/23/2015   12/26/2014-IgA 56, serum M spike not detected, lambda free light chains 1.34  Medications: I have reviewed the patient's current medications.  Assessment/Plan: 1. Multiple myeloma-confirmed on a bone marrow biopsy 08/07/2014, IgA lambda  Serum M spike and increased serum free lambda light chains  Myeloma FISH panel negative for chromosome 4, 11, 12, 13, 14, and 17 abnormalities, cytogenetics with no metaphases  Bone survey 08/15/2014 with indeterminant lucent skull lesions  Cycle 1 RVD 08/22/2014  Cycle 2 RVD 09/19/2014  Cycle 3 RVD 10/17/2014  Cycle 4 RVD 11/14/2014 (Decadron reduced to 20 mg weekly, Revlimid 15 mg days 1 through 14, Velcade 3/4 weeks)  Cycle 5 RVD 12/12/2014  Serum M spike not detected 12/26/2014  Cycle 6 RVD 01/09/2015  Maintenance Revlimid, 10 mg daily, 02/05/2015  2. 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 History of intermittent prostatitis  4. Skin rash 10/24/2014-referred to dermatology, biopsy consistent with local hypersensitivity reaction  5. 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 has completed 6 cycles of RVD. The anemia has improved and the IgA level is now normal. He will complete a final treatment with Velcade today. [name] will return for a CBC on 02/05/2015 with the plan to begin maintenance Revlimid that day.  Hopefully the edema and mild cushingoid features will improve when he is off of Decadron.  He will return for an office visit 03/05/2015  He continues close follow-up with Dr. Diona Lowery for management of the urinary retention  [name], MD  01/23/2015  11:28 AM

## 2015-01-23 NOTE — Patient Instructions (Signed)
[facility] Discharge Instructions for Patients Receiving Chemotherapy  Today you received the following chemotherapy agents Velcade. To help prevent nausea and vomiting after your treatment, we encourage you to take your nausea medication as directed.  If you develop nausea and vomiting that is not controlled by your nausea medication, call the clinic.   BELOW ARE SYMPTOMS THAT SHOULD BE REPORTED IMMEDIATELY:  *FEVER GREATER THAN 100.5 F  *CHILLS WITH OR WITHOUT FEVER  NAUSEA AND VOMITING THAT IS NOT CONTROLLED WITH YOUR NAUSEA MEDICATION  *UNUSUAL SHORTNESS OF BREATH  *UNUSUAL BRUISING OR BLEEDING  TENDERNESS IN MOUTH AND THROAT WITH OR WITHOUT PRESENCE OF ULCERS  *URINARY PROBLEMS  *BOWEL PROBLEMS  UNUSUAL RASH Items with * indicate a potential emergency and should be followed up as soon as possible.  Feel free to call the clinic you have any questions or concerns. The clinic phone number is [phone].  Please show the CHEMO ALERT CARD at check-in to the Emergency Department and triage nurse.    

## 2015-01-23 NOTE — Progress Notes (Signed)
Per Norville Haggard, RN Dr. Gearldine Shown desk nurse, okay to proceed with treatment today with platelet count of 94.

## 2015-01-25 LAB — KAPPA/LAMBDA LIGHT CHAINS
KAPPA LAMBDA RATIO: 0.9 (ref 0.26–1.65)
Kappa free light chain: 1.26 mg/dL (ref 0.33–1.94)
Lambda Free Lght Chn: 1.4 mg/dL (ref 0.57–2.63)

## 2015-01-25 LAB — IGA: IgA: 54 mg/dL — ABNORMAL LOW (ref 68–379)

## 2015-01-30 DIAGNOSIS — N390 Urinary tract infection, site not specified: Secondary | ICD-10-CM | POA: Diagnosis not present

## 2015-01-30 DIAGNOSIS — N320 Bladder-neck obstruction: Secondary | ICD-10-CM | POA: Diagnosis not present

## 2015-01-30 DIAGNOSIS — R3915 Urgency of urination: Secondary | ICD-10-CM | POA: Diagnosis not present

## 2015-01-30 DIAGNOSIS — C61 Malignant neoplasm of prostate: Secondary | ICD-10-CM | POA: Diagnosis not present

## 2015-02-01 ENCOUNTER — Telehealth: Payer: Self-pay | Admitting: Oncology

## 2015-02-01 NOTE — Telephone Encounter (Signed)
Adjusted appointments according to 1/27 pof. Spoke with patient re changes and date/times for 1/9 @ 8:15 am - 1/23 @ 8 am and 2/6 and 12pm.

## 2015-02-05 ENCOUNTER — Other Ambulatory Visit (HOSPITAL_BASED_OUTPATIENT_CLINIC_OR_DEPARTMENT_OTHER): Payer: Medicare Other

## 2015-02-05 DIAGNOSIS — C9001 Multiple myeloma in remission: Secondary | ICD-10-CM

## 2015-02-05 DIAGNOSIS — C9000 Multiple myeloma not having achieved remission: Secondary | ICD-10-CM | POA: Diagnosis present

## 2015-02-05 LAB — CBC WITH DIFFERENTIAL/PLATELET
BASO%: 0.9 % (ref 0.0–2.0)
Basophils Absolute: 0 10*3/uL (ref 0.0–0.1)
EOS ABS: 0.3 10*3/uL (ref 0.0–0.5)
EOS%: 8 % — ABNORMAL HIGH (ref 0.0–7.0)
HCT: 38.8 % (ref 38.4–49.9)
HEMOGLOBIN: 12.3 g/dL — AB (ref 13.0–17.1)
LYMPH%: 13.3 % — AB (ref 14.0–49.0)
MCH: 26.5 pg — ABNORMAL LOW (ref 27.2–33.4)
MCHC: 31.7 g/dL — ABNORMAL LOW (ref 32.0–36.0)
MCV: 83.4 fL (ref 79.3–98.0)
MONO#: 0.4 10*3/uL (ref 0.1–0.9)
MONO%: 12.3 % (ref 0.0–14.0)
NEUT%: 65.5 % (ref 39.0–75.0)
NEUTROS ABS: 2.1 10*3/uL (ref 1.5–6.5)
PLATELETS: 161 10*3/uL (ref 140–400)
RBC: 4.65 10*6/uL (ref 4.20–5.82)
RDW: 15.6 % — AB (ref 11.0–14.6)
WBC: 3.2 10*3/uL — AB (ref 4.0–10.3)
lymph#: 0.4 10*3/uL — ABNORMAL LOW (ref 0.9–3.3)

## 2015-02-09 DIAGNOSIS — N138 Other obstructive and reflux uropathy: Secondary | ICD-10-CM | POA: Diagnosis not present

## 2015-02-09 DIAGNOSIS — R3914 Feeling of incomplete bladder emptying: Secondary | ICD-10-CM | POA: Diagnosis not present

## 2015-02-09 DIAGNOSIS — N401 Enlarged prostate with lower urinary tract symptoms: Secondary | ICD-10-CM | POA: Diagnosis not present

## 2015-02-12 ENCOUNTER — Other Ambulatory Visit: Payer: Self-pay | Admitting: Oncology

## 2015-02-13 ENCOUNTER — Other Ambulatory Visit: Payer: Self-pay | Admitting: *Deleted

## 2015-02-13 MED ORDER — ACYCLOVIR 400 MG PO TABS: 400.0000 mg | ORAL_TABLET | Freq: Two times a day (BID) | ORAL | Status: DC

## 2015-02-14 DIAGNOSIS — C61 Malignant neoplasm of prostate: Secondary | ICD-10-CM | POA: Diagnosis not present

## 2015-02-14 DIAGNOSIS — N138 Other obstructive and reflux uropathy: Secondary | ICD-10-CM | POA: Diagnosis not present

## 2015-02-14 DIAGNOSIS — Z0000 Encounter for general adult medical examination without abnormal findings: Secondary | ICD-10-CM | POA: Diagnosis not present

## 2015-02-14 DIAGNOSIS — N320 Bladder-neck obstruction: Secondary | ICD-10-CM | POA: Diagnosis not present

## 2015-02-14 DIAGNOSIS — N401 Enlarged prostate with lower urinary tract symptoms: Secondary | ICD-10-CM | POA: Diagnosis not present

## 2015-02-15 ENCOUNTER — Other Ambulatory Visit: Payer: Self-pay | Admitting: *Deleted

## 2015-02-15 DIAGNOSIS — C9001 Multiple myeloma in remission: Secondary | ICD-10-CM

## 2015-02-15 MED ORDER — LENALIDOMIDE 10 MG PO CAPS: 10.0000 mg | ORAL_CAPSULE | Freq: Every day | ORAL | Status: DC

## 2015-02-15 NOTE — Telephone Encounter (Signed)
Call from [facility] pharmacist and message from pt requesting authorization number for 02/12/15 Revlimid Rx. Auth obtained and called to Vicente Males, pharmacist at [phone] # [id number].

## 2015-02-16 ENCOUNTER — Other Ambulatory Visit: Payer: Self-pay | Admitting: Urology

## 2015-02-19 ENCOUNTER — Ambulatory Visit: Payer: Medicare Other | Admitting: Oncology

## 2015-02-19 ENCOUNTER — Telehealth: Payer: Self-pay | Admitting: *Deleted

## 2015-02-19 ENCOUNTER — Other Ambulatory Visit (HOSPITAL_BASED_OUTPATIENT_CLINIC_OR_DEPARTMENT_OTHER): Payer: Medicare Other

## 2015-02-19 DIAGNOSIS — C9001 Multiple myeloma in remission: Secondary | ICD-10-CM

## 2015-02-19 DIAGNOSIS — C9000 Multiple myeloma not having achieved remission: Secondary | ICD-10-CM

## 2015-02-19 LAB — CBC WITH DIFFERENTIAL/PLATELET
BASO%: 1.8 % (ref 0.0–2.0)
BASOS ABS: 0.1 10*3/uL (ref 0.0–0.1)
EOS ABS: 0.3 10*3/uL (ref 0.0–0.5)
EOS%: 7.9 % — ABNORMAL HIGH (ref 0.0–7.0)
HCT: 41.1 % (ref 38.4–49.9)
HEMOGLOBIN: 12.9 g/dL — AB (ref 13.0–17.1)
LYMPH%: 20.7 % (ref 14.0–49.0)
MCH: 26.2 pg — AB (ref 27.2–33.4)
MCHC: 31.4 g/dL — ABNORMAL LOW (ref 32.0–36.0)
MCV: 83.5 fL (ref 79.3–98.0)
MONO#: 0.5 10*3/uL (ref 0.1–0.9)
MONO%: 15.2 % — ABNORMAL HIGH (ref 0.0–14.0)
NEUT%: 54.4 % (ref 39.0–75.0)
NEUTROS ABS: 1.8 10*3/uL (ref 1.5–6.5)
PLATELETS: 112 10*3/uL — AB (ref 140–400)
RBC: 4.92 10*6/uL (ref 4.20–5.82)
RDW: 15.7 % — AB (ref 11.0–14.6)
WBC: 3.3 10*3/uL — AB (ref 4.0–10.3)
lymph#: 0.7 10*3/uL — ABNORMAL LOW (ref 0.9–3.3)

## 2015-02-19 NOTE — Telephone Encounter (Signed)
-----   Message from Ladell Pier, MD sent at 02/19/2015  1:58 PM EST ----- Please call patient, counts are ok, continue revlimid, f/u as scheduled

## 2015-02-19 NOTE — Telephone Encounter (Signed)
Per [name]; notified pt that counts are ok, continue revlimid, f/u as scheduled.  Pt verbalized understanding.

## 2015-02-26 ENCOUNTER — Telehealth: Payer: Self-pay | Admitting: *Deleted

## 2015-02-26 NOTE — Telephone Encounter (Signed)
Call from pt reporting swelling in BLE. L greater than R. Pt denies erythema or pain, reports he "stumbled at the gym" recently and bruised knees. Walking slower/ some discomfort due to that.  Reviewed above with [name]: Stop Revlimid until next visit. There is a low incidence of edema related to Revlimid. Will evaluate at next visit. OK to try compression hose.  Pt given above info. He voiced understanding.

## 2015-03-05 ENCOUNTER — Telehealth: Payer: Self-pay | Admitting: Oncology

## 2015-03-05 ENCOUNTER — Other Ambulatory Visit (HOSPITAL_BASED_OUTPATIENT_CLINIC_OR_DEPARTMENT_OTHER): Payer: Medicare Other

## 2015-03-05 ENCOUNTER — Ambulatory Visit (HOSPITAL_BASED_OUTPATIENT_CLINIC_OR_DEPARTMENT_OTHER): Payer: Medicare Other | Admitting: Oncology

## 2015-03-05 ENCOUNTER — Other Ambulatory Visit: Payer: Self-pay | Admitting: Oncology

## 2015-03-05 VITALS — BP 154/76 | HR 59 | Temp 98.4°F | Resp 18 | Ht 71.0 in | Wt 185.9 lb

## 2015-03-05 DIAGNOSIS — R197 Diarrhea, unspecified: Secondary | ICD-10-CM | POA: Diagnosis not present

## 2015-03-05 DIAGNOSIS — C9001 Multiple myeloma in remission: Secondary | ICD-10-CM

## 2015-03-05 DIAGNOSIS — R609 Edema, unspecified: Secondary | ICD-10-CM

## 2015-03-05 DIAGNOSIS — C61 Malignant neoplasm of prostate: Secondary | ICD-10-CM

## 2015-03-05 LAB — COMPREHENSIVE METABOLIC PANEL
ALT: 11 U/L (ref 0–55)
AST: 14 U/L (ref 5–34)
Albumin: 3.3 g/dL — ABNORMAL LOW (ref 3.5–5.0)
Alkaline Phosphatase: 61 U/L (ref 40–150)
Anion Gap: 9 mEq/L (ref 3–11)
BUN: 9.5 mg/dL (ref 7.0–26.0)
CHLORIDE: 110 meq/L — AB (ref 98–109)
CO2: 23 mEq/L (ref 22–29)
Calcium: 8.8 mg/dL (ref 8.4–10.4)
Creatinine: 0.7 mg/dL (ref 0.7–1.3)
EGFR: 89 mL/min/{1.73_m2} — ABNORMAL LOW (ref >90)
GLUCOSE: 102 mg/dL (ref 70–140)
POTASSIUM: 3.8 meq/L (ref 3.5–5.1)
SODIUM: 142 meq/L (ref 136–145)
Total Bilirubin: 0.44 mg/dL (ref 0.20–1.20)
Total Protein: 6 g/dL — ABNORMAL LOW (ref 6.4–8.3)

## 2015-03-05 LAB — CBC WITH DIFFERENTIAL/PLATELET
BASO%: 2.4 % — ABNORMAL HIGH (ref 0.0–2.0)
BASOS ABS: 0.1 10*3/uL (ref 0.0–0.1)
EOS%: 2.8 % (ref 0.0–7.0)
Eosinophils Absolute: 0.1 10*3/uL (ref 0.0–0.5)
HCT: 39.7 % (ref 38.4–49.9)
HGB: 12.6 g/dL — ABNORMAL LOW (ref 13.0–17.1)
LYMPH%: 21.7 % (ref 14.0–49.0)
MCH: 25.5 pg — AB (ref 27.2–33.4)
MCHC: 31.7 g/dL — AB (ref 32.0–36.0)
MCV: 80.4 fL (ref 79.3–98.0)
MONO#: 0.6 10*3/uL (ref 0.1–0.9)
MONO%: 17.3 % — AB (ref 0.0–14.0)
NEUT#: 1.9 10*3/uL (ref 1.5–6.5)
NEUT%: 55.8 % (ref 39.0–75.0)
Platelets: 161 10*3/uL (ref 140–400)
RBC: 4.94 10*6/uL (ref 4.20–5.82)
RDW: 17.1 % — ABNORMAL HIGH (ref 11.0–14.6)
WBC: 3.4 10*3/uL — ABNORMAL LOW (ref 4.0–10.3)
lymph#: 0.7 10*3/uL — ABNORMAL LOW (ref 0.9–3.3)

## 2015-03-05 NOTE — Progress Notes (Signed)
  [place] OFFICE PROGRESS NOTE   Diagnosis:  Multiple myeloma  INTERVAL HISTORY:    Mitchell Lowery returns as scheduled. He began maintenance Revlimid on 02/05/2015. He noted persistent lower extremity edema and intermittent diarrhea while on Revlimid. He reports 2-3 loose stools daily occurring every 2-3 days while on the maintenance Revlimid.  He called the office 02/26/2015 and the Revlimid was discontinued secondary to progressive swelling. He reports improvement in the swelling over the past week.  He fell after missing a step at the gym a few weeks ago and injured the left chest wall and nose.   His energy level is improved. He continues to have urinary retention. He is scheduled for a prostate procedures next week. He continues self catheterization.   The cushingoid changes have improved since discontinuing Decadron.  Objective:  Vital signs in last 24 hours:  Blood pressure 154/76, pulse 59, temperature 98.4 F (36.9 C), temperature source Oral, resp. rate 18, height '5\' 11"'$  (1.803 m), weight 185 lb 14.4 oz (84.324 kg), SpO2 97 %.    HEENT:  No thrush Resp:  Lungs clear bilaterally Cardio:  Regular rate and rhythm GI:  No hepatosplenomegaly Vascular:  Trace low leg edema bilaterally      Lab Results:  Lab Results  Component Value Date   WBC 3.4* 03/05/2015   HGB 12.6* 03/05/2015   HCT 39.7 03/05/2015   MCV 80.4 03/05/2015   PLT 161 03/05/2015   NEUTROABS 1.9 03/05/2015     Medications: I have reviewed the patient's current medications.  Assessment/Plan: 1. Multiple myeloma-confirmed on a bone marrow biopsy 08/07/2014, IgA lambda  Serum M spike and increased serum free lambda light chains  Myeloma FISH panel negative for chromosome 4, 11, 12, 13, 14, and 17 abnormalities, cytogenetics with no metaphases  Bone survey 08/15/2014 with indeterminant lucent skull lesions  Cycle 1 RVD 08/22/2014  Cycle 2 RVD 09/19/2014  Cycle 3 RVD  10/17/2014  Cycle 4 RVD 11/14/2014 (Decadron reduced to 20 mg weekly, Revlimid 15 mg days 1 through 14, Velcade 3/4 weeks)  Cycle 5 RVD 12/12/2014  Serum M spike not detected 12/26/2014  Cycle 6 RVD 01/09/2015  Maintenance Revlimid, 10 mg daily, 02/05/2015 , discontinue 02/26/2015 secondary to leg edema and diarrhea  2. 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 History of intermittent prostatitis  4. Skin rash 10/24/2014-referred to dermatology, biopsy consistent with local hypersensitivity reaction  5. 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 I suspect the leg edema and intermittent diarrhea are related to toxicity from Revlimid. He will remain off of Revlimid this week with a plan to begin Revlimid at a dose of 10 mg every other day on 03/13/2015. He will return for an office and lab visit 04/02/2015. He will contact us in the interim as needed.  He is scheduled to undergo a procedure with Dr. Diona Fanti on 03/12/2015 for treatment of urinary retention.   We will follow-up on the IgA level and  Serum light chains from today.   [name], MD  03/05/2015  12:53 PM

## 2015-03-05 NOTE — Telephone Encounter (Signed)
Pt confirmed labs/ov per 02/06 POF, gave pt AVS and Calendar... KJ °

## 2015-03-06 LAB — KAPPA/LAMBDA LIGHT CHAINS
Ig Kappa Free Light Chain: 13.93 mg/L (ref 3.30–19.40)
Ig Lambda Free Light Chain: 19.65 mg/L (ref 5.71–26.30)
Kappa/Lambda FluidC Ratio: 0.71 (ref 0.26–1.65)

## 2015-03-06 LAB — IGA: IGA/IMMUNOGLOBULIN A, SERUM: 117 mg/dL (ref 61–437)

## 2015-03-06 NOTE — Patient Instructions (Addendum)
Mitchell Lowery  03/06/2015   Your procedure is scheduled on: 2-13- 17   Report to [place]  Entrance take [facility]  elevators to 3rd floor to  [place] at  0600 AM.  Call this number if you have problems the morning of surgery [phone]   Remember: ONLY 1 PERSON MAY GO WITH YOU TO SHORT STAY TO GET  READY MORNING OF [place].  Do not eat food or drink liquids :After Midnight.     Take these medicines the morning of surgery with A SIP OF WATER: none. DO NOT TAKE ANY DIABETIC MEDICATIONS DAY OF YOUR SURGERY                               You may not have any metal on your body including hair pins and              piercings  Do not wear jewelry, make-up, lotions, powders or perfumes, deodorant             Do not wear nail polish.  Do not shave  48 hours prior to surgery.              Men may shave face and neck.   Do not bring valuables to the hospital. [place].  Contacts, dentures or bridgework may not be worn into surgery.  Leave suitcase in the car. After surgery it may be brought to your room.     Patients discharged the day of surgery will not be allowed to drive home.  Name and phone number of your driver: spouse-Margaret  Special Instructions: N/A              Please read over the following fact sheets you were given: _____________________________________________________________________             [facility] - Preparing for Surgery Before surgery, you can play an important role.  Because skin is not sterile, your skin needs to be as free of germs as possible.  You can reduce the number of germs on your skin by washing with CHG (chlorahexidine gluconate) soap before surgery.  CHG is an antiseptic cleaner which kills germs and bonds with the skin to continue killing germs even after washing. Please DO NOT use if you have an allergy to CHG or antibacterial soaps.  If your skin  becomes reddened/irritated stop using the CHG and inform your nurse when you arrive at Short Stay. Do not shave (including legs and underarms) for at least 48 hours prior to the first CHG shower.  You may shave your face/neck. Please follow these instructions carefully:  1.  Shower with CHG Soap the night before surgery and the  morning of Surgery.  2.  If you choose to wash your hair, wash your hair first as usual with your  normal  shampoo.  3.  After you shampoo, rinse your hair and body thoroughly to remove the  shampoo.                           4.  Use CHG as you would any other liquid soap.  You can apply chg directly  to the skin  and wash                       Gently with a scrungie or clean washcloth.  5.  Apply the CHG Soap to your body ONLY FROM THE NECK DOWN.   Do not use on face/ open                           Wound or open sores. Avoid contact with eyes, ears mouth and genitals (private parts).                       Wash face,  Genitals (private parts) with your normal soap.             6.  Wash thoroughly, paying special attention to the area where your surgery  will be performed.  7.  Thoroughly rinse your body with warm water from the neck down.  8.  DO NOT shower/wash with your normal soap after using and rinsing off  the CHG Soap.                9.  Pat yourself dry with a clean towel.            10.  Wear clean pajamas.            11.  Place clean sheets on your bed the night of your first shower and do not  sleep with pets. Day of Surgery : Do not apply any lotions/deodorants the morning of surgery.  Please wear clean clothes to the hospital/surgery center.  FAILURE TO FOLLOW THESE INSTRUCTIONS MAY RESULT IN THE CANCELLATION OF YOUR SURGERY PATIENT SIGNATURE_________________________________  NURSE SIGNATURE__________________________________  ________________________________________________________________________

## 2015-03-06 NOTE — Pre-Procedure Instructions (Deleted)
Mitchell Lowery  03/06/2015   Your procedure is scheduled on: 03-12-15  Report to [facility] Main  Entrance take [facility]  elevators to 3rd floor to  [place] at 0600  AM.  Call this number if you have problems the morning of surgery [phone]   Remember: ONLY 1 PERSON MAY GO WITH YOU TO SHORT STAY TO GET  READY MORNING OF [place].  Do not eat food or drink liquids :After Midnight.     Take these medicines the morning of surgery with A SIP OF WATER:  DO NOT TAKE ANY DIABETIC MEDICATIONS DAY OF YOUR SURGERY                               You may not have any metal on your body including hair pins and              piercings  Do not wear jewelry, make-up, lotions, powders or perfumes, deodorant             Do not wear nail polish.  Do not shave  48 hours prior to surgery.              Men may shave face and neck.   Do not bring valuables to the hospital. [place].  Contacts, dentures or bridgework may not be worn into surgery.  Leave suitcase in the car. After surgery it may be brought to your room.     Patients discharged the day of surgery will not be allowed to drive home.  Name and phone number of your driver:  Special Instructions: N/A              Please read over the following fact sheets you were given: _____________________________________________________________________             [facility] - Preparing for Surgery Before surgery, you can play an important role.  Because skin is not sterile, your skin needs to be as free of germs as possible.  You can reduce the number of germs on your skin by washing with CHG (chlorahexidine gluconate) soap before surgery.  CHG is an antiseptic cleaner which kills germs and bonds with the skin to continue killing germs even after washing. Please DO NOT use if you have an allergy to CHG or antibacterial soaps.  If your skin becomes reddened/irritated  stop using the CHG and inform your nurse when you arrive at Short Stay. Do not shave (including legs and underarms) for at least 48 hours prior to the first CHG shower.  You may shave your face/neck. Please follow these instructions carefully:  1.  Shower with CHG Soap the night before surgery and the  morning of Surgery.  2.  If you choose to wash your hair, wash your hair first as usual with your  normal  shampoo.  3.  After you shampoo, rinse your hair and body thoroughly to remove the  shampoo.                           4.  Use CHG as you would any other liquid soap.  You can apply chg directly  to the skin and wash  Gently with a scrungie or clean washcloth.  5.  Apply the CHG Soap to your body ONLY FROM THE NECK DOWN.   Do not use on face/ open                           Wound or open sores. Avoid contact with eyes, ears mouth and genitals (private parts).                       Wash face,  Genitals (private parts) with your normal soap.             6.  Wash thoroughly, paying special attention to the area where your surgery  will be performed.  7.  Thoroughly rinse your body with warm water from the neck down.  8.  DO NOT shower/wash with your normal soap after using and rinsing off  the CHG Soap.                9.  Pat yourself dry with a clean towel.            10.  Wear clean pajamas.            11.  Place clean sheets on your bed the night of your first shower and do not  sleep with pets. Day of Surgery : Do not apply any lotions/deodorants the morning of surgery.  Please wear clean clothes to the hospital/surgery center.  FAILURE TO FOLLOW THESE INSTRUCTIONS MAY RESULT IN THE CANCELLATION OF YOUR SURGERY PATIENT SIGNATURE_________________________________  NURSE SIGNATURE__________________________________  ________________________________________________________________________

## 2015-03-07 ENCOUNTER — Encounter (HOSPITAL_COMMUNITY): Payer: Self-pay

## 2015-03-07 ENCOUNTER — Telehealth: Payer: Self-pay | Admitting: *Deleted

## 2015-03-07 ENCOUNTER — Encounter (HOSPITAL_COMMUNITY)
Admission: RE | Admit: 2015-03-07 | Discharge: 2015-03-07 | Disposition: A | Discharge status: Home / Self Care | Payer: Medicare Other | Source: Ambulatory Visit | Attending: Urology | Admitting: Urology

## 2015-03-07 DIAGNOSIS — R338 Other retention of urine: Secondary | ICD-10-CM | POA: Insufficient documentation

## 2015-03-07 DIAGNOSIS — N401 Enlarged prostate with lower urinary tract symptoms: Secondary | ICD-10-CM | POA: Diagnosis not present

## 2015-03-07 DIAGNOSIS — Z01818 Encounter for other preprocedural examination: Secondary | ICD-10-CM | POA: Diagnosis not present

## 2015-03-07 HISTORY — DX: Anemia, unspecified: D64.9

## 2015-03-07 HISTORY — DX: Unspecified hemorrhoids: K64.9

## 2015-03-07 HISTORY — DX: Urinary tract infection, site not specified: N39.0

## 2015-03-07 HISTORY — DX: Cardiac murmur, unspecified: R01.1

## 2015-03-07 HISTORY — DX: Multiple myeloma not having achieved remission: C90.00

## 2015-03-07 LAB — PROTEIN ELECTROPHORESIS, SERUM
A/G RATIO SPE: 1.2 (ref 0.7–1.7)
ALBUMIN: 3.1 g/dL (ref 2.9–4.4)
Alpha 1: 0.3 g/dL (ref 0.0–0.4)
Alpha 2: 0.8 g/dL (ref 0.4–1.0)
BETA: 0.9 g/dL (ref 0.7–1.3)
GAMMA GLOBULIN: 0.5 g/dL (ref 0.4–1.8)
GLOBULIN, TOTAL: 2.5 g/dL (ref 2.2–3.9)
TOTAL PROTEIN: 5.6 g/dL — AB (ref 6.0–8.5)

## 2015-03-07 NOTE — Pre-Procedure Instructions (Signed)
EKG 7'16, CXR 5'16, labs- CBC/d, CMP 03-05-15 Epic.

## 2015-03-07 NOTE — Telephone Encounter (Signed)
-----   Message from Ladell Pier, MD sent at 03/07/2015 10:54 AM EST ----- Please call patient, IgA and light chains are normal

## 2015-03-07 NOTE — Telephone Encounter (Signed)
Per [name]; notified pt that IgA and light chains are normal.  Pt verbalized understanding.

## 2015-03-12 ENCOUNTER — Ambulatory Visit (HOSPITAL_COMMUNITY)
Admission: RE | Admit: 2015-03-12 | Discharge: 2015-03-12 | Disposition: A | Discharge status: Home / Self Care | Payer: Medicare Other | Source: Ambulatory Visit | Attending: Urology | Admitting: Urology

## 2015-03-12 ENCOUNTER — Ambulatory Visit (HOSPITAL_COMMUNITY): Payer: Medicare Other | Admitting: Certified Registered Nurse Anesthetist

## 2015-03-12 ENCOUNTER — Encounter (HOSPITAL_COMMUNITY): Payer: Self-pay | Admitting: *Deleted

## 2015-03-12 ENCOUNTER — Encounter (HOSPITAL_COMMUNITY)
Admission: RE | Disposition: A | Discharge status: Home / Self Care | Payer: Self-pay | Source: Ambulatory Visit | Attending: Urology

## 2015-03-12 DIAGNOSIS — N320 Bladder-neck obstruction: Secondary | ICD-10-CM | POA: Insufficient documentation

## 2015-03-12 DIAGNOSIS — C9000 Multiple myeloma not having achieved remission: Secondary | ICD-10-CM | POA: Insufficient documentation

## 2015-03-12 DIAGNOSIS — R339 Retention of urine, unspecified: Secondary | ICD-10-CM | POA: Diagnosis not present

## 2015-03-12 DIAGNOSIS — Z923 Personal history of irradiation: Secondary | ICD-10-CM | POA: Insufficient documentation

## 2015-03-12 DIAGNOSIS — Z7982 Long term (current) use of aspirin: Secondary | ICD-10-CM | POA: Diagnosis not present

## 2015-03-12 DIAGNOSIS — D649 Anemia, unspecified: Secondary | ICD-10-CM | POA: Insufficient documentation

## 2015-03-12 DIAGNOSIS — Z8546 Personal history of malignant neoplasm of prostate: Secondary | ICD-10-CM | POA: Diagnosis not present

## 2015-03-12 DIAGNOSIS — N138 Other obstructive and reflux uropathy: Secondary | ICD-10-CM | POA: Diagnosis not present

## 2015-03-12 DIAGNOSIS — N401 Enlarged prostate with lower urinary tract symptoms: Secondary | ICD-10-CM

## 2015-03-12 DIAGNOSIS — Z79899 Other long term (current) drug therapy: Secondary | ICD-10-CM | POA: Insufficient documentation

## 2015-03-12 DIAGNOSIS — N3289 Other specified disorders of bladder: Secondary | ICD-10-CM | POA: Diagnosis not present

## 2015-03-12 DIAGNOSIS — R011 Cardiac murmur, unspecified: Secondary | ICD-10-CM | POA: Insufficient documentation

## 2015-03-12 DIAGNOSIS — N400 Enlarged prostate without lower urinary tract symptoms: Secondary | ICD-10-CM | POA: Diagnosis not present

## 2015-03-12 HISTORY — PX: CYSTOSCOPY WITH INSERTION OF UROLIFT: SHX6678

## 2015-03-12 SURGERY — CYSTOSCOPY WITH INSERTION OF UROLIFT
Anesthesia: General

## 2015-03-12 MED ORDER — PROPOFOL 10 MG/ML IV BOLUS
INTRAVENOUS | Status: DC | PRN
  Administered 2015-03-12: 200 mg via INTRAVENOUS

## 2015-03-12 MED ORDER — LACTATED RINGERS IV SOLN
INTRAVENOUS | Status: DC | PRN
  Administered 2015-03-12: 08:00:00 via INTRAVENOUS

## 2015-03-12 MED ORDER — EPHEDRINE SULFATE 50 MG/ML IJ SOLN
INTRAMUSCULAR | Status: AC
  Filled 2015-03-12: qty 1

## 2015-03-12 MED ORDER — FENTANYL CITRATE (PF) 100 MCG/2ML IJ SOLN
25.0000 ug | INTRAMUSCULAR | Status: DC | PRN
  Administered 2015-03-12 (×2): 50 ug via INTRAVENOUS

## 2015-03-12 MED ORDER — LACTATED RINGERS IV SOLN: INTRAVENOUS | Status: DC

## 2015-03-12 MED ORDER — NITROFURANTOIN MONOHYD MACRO 100 MG PO CAPS: 100.0000 mg | ORAL_CAPSULE | Freq: Two times a day (BID) | ORAL | Status: DC

## 2015-03-12 MED ORDER — EPHEDRINE SULFATE 50 MG/ML IJ SOLN
INTRAMUSCULAR | Status: DC | PRN
  Administered 2015-03-12: 5 mg via INTRAVENOUS

## 2015-03-12 MED ORDER — SODIUM CHLORIDE 0.9 % IJ SOLN
INTRAMUSCULAR | Status: AC
  Filled 2015-03-12: qty 10

## 2015-03-12 MED ORDER — PROPOFOL 10 MG/ML IV BOLUS
INTRAVENOUS | Status: AC
  Filled 2015-03-12: qty 20

## 2015-03-12 MED ORDER — CEFAZOLIN SODIUM-DEXTROSE 2-3 GM-% IV SOLR
INTRAVENOUS | Status: AC
  Filled 2015-03-12: qty 50

## 2015-03-12 MED ORDER — CEFAZOLIN SODIUM-DEXTROSE 2-3 GM-% IV SOLR
2.0000 g | INTRAVENOUS | Status: AC
  Administered 2015-03-12: 2 g via INTRAVENOUS

## 2015-03-12 MED ORDER — LIDOCAINE HCL (CARDIAC) 20 MG/ML IV SOLN
INTRAVENOUS | Status: DC | PRN
  Administered 2015-03-12: 100 mg via INTRAVENOUS

## 2015-03-12 MED ORDER — STERILE WATER FOR IRRIGATION IR SOLN
Status: DC | PRN
  Administered 2015-03-12: 3000 mL

## 2015-03-12 MED ORDER — LIDOCAINE HCL (CARDIAC) 20 MG/ML IV SOLN
INTRAVENOUS | Status: AC
  Filled 2015-03-12: qty 5

## 2015-03-12 MED ORDER — ONDANSETRON HCL 4 MG/2ML IJ SOLN: 4.0000 mg | Freq: Once | INTRAMUSCULAR | Status: DC | PRN

## 2015-03-12 MED ORDER — FENTANYL CITRATE (PF) 100 MCG/2ML IJ SOLN
INTRAMUSCULAR | Status: DC | PRN
  Administered 2015-03-12 (×2): 50 ug via INTRAVENOUS

## 2015-03-12 MED ORDER — GLYCOPYRROLATE 0.2 MG/ML IJ SOLN
INTRAMUSCULAR | Status: DC | PRN
  Administered 2015-03-12: 0.2 mg via INTRAVENOUS

## 2015-03-12 MED ORDER — FENTANYL CITRATE (PF) 100 MCG/2ML IJ SOLN
INTRAMUSCULAR | Status: AC
  Filled 2015-03-12: qty 2

## 2015-03-12 MED ORDER — ONDANSETRON HCL 4 MG/2ML IJ SOLN
INTRAMUSCULAR | Status: AC
  Filled 2015-03-12: qty 2

## 2015-03-12 MED ORDER — ONDANSETRON HCL 4 MG/2ML IJ SOLN
INTRAMUSCULAR | Status: DC | PRN
  Administered 2015-03-12: 4 mg via INTRAVENOUS

## 2015-03-12 MED ORDER — 0.9 % SODIUM CHLORIDE (POUR BTL) OPTIME
TOPICAL | Status: DC | PRN
  Administered 2015-03-12: 1000 mL

## 2015-03-12 MED ORDER — MEPERIDINE HCL 50 MG/ML IJ SOLN
6.2500 mg | INTRAMUSCULAR | Status: DC | PRN
Start: 2015-03-12 — End: 2015-03-12

## 2015-03-12 SURGICAL SUPPLY — 8 items
BAG URO CATCHER STRL LF (MISCELLANEOUS) ×3 IMPLANT
GLOVE BIOGEL M STRL SZ7.5 (GLOVE) ×3 IMPLANT
GOWN STRL REUS W/TWL XL LVL3 (GOWN DISPOSABLE) ×6 IMPLANT
MANIFOLD NEPTUNE II (INSTRUMENTS) ×3 IMPLANT
PACK CYSTO (CUSTOM PROCEDURE TRAY) ×3 IMPLANT
SYSTEM UROLIFT (Male Continence) ×18 IMPLANT
TUBING CONNECTING 10 (TUBING) ×2 IMPLANT
TUBING CONNECTING 10' (TUBING) ×1

## 2015-03-12 NOTE — Op Note (Addendum)
PATIENT:  Mitchell Lowery  PRE-OPERATIVE DIAGNOSIS: bladder outlet obstruction with urinary retention  POST-OPERATIVE DIAGNOSIS: Same  PROCEDURE: Urolift procedure cystoscopy  SURGEON:  Lillette Boxer. Lakyra Tippins, M.D.  ANESTHESIA:  General  EBL:  Minimal  DRAINS: None  LOCAL MEDICATIONS USED:  None  SPECIMEN:  none  INDICATION: Mitchell Lowery is a 81 year old male presenting at this time for Urolift procedure.  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  Since he completed his brachytherapy, he has basically been in urinary retention.  Urodynamic and cystoscopic studies have been consistent with bladder outlet obstruction.  He has failed medical therapy for bladder outlet obstruction.He has normal bladder capacity and bladder pressures.  We have discussed management of his urinary retention, which currently is treated with self intermittent catheterization, which she has adapted to quite well.  We have talked about transurethral resection of his prostate as well as Urolift procedure.risks and benefits of each of these have been discussed with him at length.  He has decided to proceed with Urolift procedure.  Description of procedure: The patient was properly identified in the holding area. They were then  taken to the operating room and placed on the table in a supine position. General anesthesia was then administered. Once fully anesthetized the patient was moved to the dorsolithotomy position and the genitalia and perineum were sterilely prepped and draped in standard fashion. An official timeout was then performed.  A 65F cystoscope was inserted into the bladder. Circumferential inspection of the bladderwas performed. This revealed moderate trabeculation of the bladder wall.  There were no tumors or foreign bodies.  Ureteral orifices were normal in configuration and location.  The cystoscopy bridge was replaced with a UroLift delivery device.The first  treatment site was the patient's left side approximately 1.5cm distal to the bladder neck. The distal tip of the delivery device was then angled laterally approximately 20 degrees at this position to compress the lateral lobe.  The trigger was pulled, thereby deploying a needle containing the implant through the prostate.  The needle was then retracted, allowing one end of the implant to be delivered to the capsular surface of the prostate.  The implant was then tensioned to assure capsular seating and removal of slack monofilament.  The device was then angled back toward midline and slowly advanced proximally until cystoscopic verification of the monofilament being centered in the delivery bay. The urethral end piece was then affixed to the monofilament thereby tailoring the size of the implant.  Excess filament was then severed.  The delivery device was then re-advanced into the bladder. The delivery device was then replaced with cystoscope and bridge and the implant location and opening effect was confirmed cystoscopically.  The same procedure was then repeated on the right side, and two additional implants were delivered just proximal to the veru montanum, again one on right and one on left side of the prostate, following the same technique. Cystoscopy then revealed a persistent area of obstruction, and two more implants were delivered in the mid prostate.  A final cystoscopy was conducted first to inspect the location and state of each implant and second, to confirm the presence of a continuous anterior channel was present through the prostatic urethra with irrigation flow turned off.6 Implants were delivered in total.  The bladder was then filled with 150 cc irrigation fluid to assist the patient in void trial after the procedure, and all instruments were removed.      PLAN  OF CARE: Discharge to home after PACU  PATIENT DISPOSITION:  PACU - hemodynamically stable.

## 2015-03-12 NOTE — H&P (Signed)
Urology History and Physical Exam  CC: Urinary retention  HPI: 81 year old male presents for a Urolift procedure as treatment for post-XRT urinary retention. 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 He is also on ADT. He has been on SIC for several months because of inability to urinate. Evaluation has included UDS and cystoscopy which has revealed findings c/w BOO. We have discussed treatment options, including continuing on SIC, channel TUR-P as well as Urolift. Risks,complications have been discussed with [name]. He has chosen the latter. He is also being treated for multiple myeloma  PMH: Past Medical History  Diagnosis Date  . Prostatitis   . Anal fistula     treated in [place]  . Hemorrhoids   . Heart murmur   . Prostate cancer [facility])     urinary retention, surgery planned-prior radiation, and seed implant about 1 year ago.  . Multiple myeloma ([facility])     1 month remission now-last tx. 1 month ago- /[name]  . UTI (lower urinary tract infection)     multiple urinary trract infection- being tx presently with antibiotic at present.  . Anemia     only due to myeloma- "normal now"    PSH: Past Surgical History  Procedure Laterality Date  . Prostate biopsy    . Prostate biopsy    . Anal fistulectomy    . Ankle surgery    . Radioactive seed implant N/A 07/21/2014    Procedure: RADIOACTIVE SEED IMPLANT/BRACHYTHERAPY IMPLANT;  Surgeon: Franchot Gallo, MD;  Location: [facility];  Service: Urology;  Laterality: N/A;  . Tonsillectomy    . Cystoscopy      Allergies: No Known Allergies  Medications: Prescriptions prior to admission  Medication Sig Dispense Refill Last Dose  . acyclovir (ZOVIRAX) 400 MG tablet Take 1 tablet (400 mg total) by mouth 2 (two) times daily. 60 tablet 5   . aspirin 81 MG tablet Take 1 tablet (81 mg total) by mouth daily. (Patient not taking: Reported on 03/05/2015) 30 tablet 0 Not Taking  . lenalidomide  (REVLIMID) 10 MG capsule Take 1 capsule (10 mg total) by mouth daily. (Patient not taking: Reported on 03/05/2015) 28 capsule 0 Not Taking  . MYRBETRIQ 25 MG TB24 tablet Take 25 mg by mouth every morning.    Taking  . HYDROcodone-acetaminophen (NORCO) 5-325 MG per tablet Take 1-2 tablets by mouth every 4 (four) hours as needed for moderate pain. (Patient not taking: Reported on 02/22/2015) 15 tablet 0 Not Taking     Social History: Social History   Social History  . Marital Status: Unknown    Spouse Name: N/A  . Number of Children: N/A  . Years of Education: N/A   Occupational History  . Not on file.   Social History Main Topics  . Smoking status: Never Smoker   . Smokeless tobacco: Never Used  . Alcohol Use: Yes     Comment: 1 glass wine daily  . Drug Use: No  . Sexual Activity: Yes   Other Topics Concern  . Not on file   Social History Narrative    Family History: Family History  Problem Relation Age of Onset  . Cancer Maternal Grandfather     mouth/throat ca    Review of Systems: Positive: Urinary retention. Hot flashes Negative: A further 10 point review of systems was negative except what is listed in the HPI.  Physical Exam: _0 @ General: No acute distress.  Awake. Head:  Normocephalic.  Atraumatic. ENT:  EOMI.  Mucous membranes moist Neck:  Supple.  No lymphadenopathy. CV:  S1 present. S2 present. Regular rate. Pulmonary: Equal effort bilaterally.  Clear to auscultation bilaterally. Abdomen: Soft.  Non tender to palpation. Skin:  Normal turgor.  No visible rash. Extremity: No gross deformity of bilateral upper extremities.  No gross deformity of                             lower extremities. Neurologic: Alert. Appropriate mood.    Studies:  No results for input(s): HGB, WBC, PLT in the last 72 hours.  No results for input(s): NA, K, CL, CO2, BUN, CREATININE, CALCIUM, GFRNONAA, GFRAA in the last 72 hours.  Invalid input(s):  MAGNESIUM   No results for input(s): INR, APTT in the last 72 hours.  Invalid input(s): PT   Invalid input(s): ABG    Assessment:  BOO  Plan: Urolift procedure

## 2015-03-12 NOTE — Anesthesia Preprocedure Evaluation (Addendum)
Anesthesia Evaluation  Patient identified by MRN, date of birth, ID band Patient awake    Reviewed: Allergy & Precautions, NPO status , Patient's Chart, lab work & pertinent test results  Airway Mallampati: II  TM Distance: >3 FB Neck ROM: Full    Dental  (+) Caps, Teeth Intact   Pulmonary neg pulmonary ROS,    Pulmonary exam normal breath sounds clear to auscultation       Cardiovascular negative cardio ROS  + Valvular Problems/Murmurs MR  Rhythm:Regular Rate:Normal     Neuro/Psych negative neurological ROS  negative psych ROS   GI/Hepatic negative GI ROS, Neg liver ROS,   Endo/Other  negative endocrine ROS  Renal/GU negative Renal ROS   Hx/o Prostate Ca S/P seed implants negative genitourinary   Musculoskeletal negative musculoskeletal ROS (+)   Abdominal   Peds  Hematology  (+) anemia , Hx/o Multiple myeloma currently being Rx'd   Anesthesia Other Findings   Reproductive/Obstetrics                            Anesthesia Physical Anesthesia Plan  ASA: II  Anesthesia Plan: General   Post-op Pain Management:    Induction: Intravenous  Airway Management Planned: LMA  Additional Equipment:   Intra-op Plan:   Post-operative Plan: Extubation in OR  Informed Consent: I have reviewed the patients History and Physical, chart, labs and discussed the procedure including the risks, benefits and alternatives for the proposed anesthesia with the patient or authorized representative who has indicated his/her understanding and acceptance.   Dental advisory given  Plan Discussed with: Anesthesiologist, CRNA and Surgeon  Anesthesia Plan Comments:         Anesthesia Quick Evaluation  

## 2015-03-12 NOTE — Anesthesia Postprocedure Evaluation (Signed)
Anesthesia Post Note  Patient: Mitchell Lowery  Procedure(s) Performed: Procedure(s) (LRB): CYSTOSCOPY WITH INSERTION OF UROLIFT (N/A)  Patient location during evaluation: PACU Anesthesia Type: General Level of consciousness: awake and alert and oriented Pain management: pain level controlled Vital Signs Assessment: post-procedure vital signs reviewed and stable Respiratory status: spontaneous breathing, nonlabored ventilation, respiratory function stable and patient connected to nasal cannula oxygen Cardiovascular status: blood pressure returned to baseline and stable Postop Assessment: no signs of nausea or vomiting Anesthetic complications: no    Last Vitals:  Filed Vitals:   03/12/15 1030 03/12/15 1045  BP: 166/88 159/81  Pulse: 62 57  Temp:  36.3 C  Resp: 10 12    Last Pain:  Filed Vitals:   03/12/15 1056  PainSc: 0-No pain                 Ruble Buttler A.

## 2015-03-12 NOTE — Discharge Instructions (Signed)
1. You may see some blood in the urine and may have some burning with urination for 48-72 hours. You also may notice that you have to urinate more frequently or urgently after your procedure which is normal.  2. You should call should you develop an inability urinate, fever > 101, persistent nausea and vomiting that prevents you from eating or drinking to stay hydrated.  3. If you have a stent, you will likely urinate more frequently and urgently until the stent is removed and you may experience some discomfort/pain in the lower abdomen and flank especially when urinating. You may take pain medication prescribed to you if needed for pain. You may also intermittently have blood in the urine until the stent is removed. If you have a catheter, you will be taught how to take care of the catheter by the nursing staff prior to discharge from the hospital.  You may periodically feel a strong urge to void with the catheter in place.  This is a bladder spasm and most often can occur when having a bowel movement or moving around. It is typically self-limited and usually will stop after a few minutes.  You may use some Vaseline or Neosporin around the tip of the catheter to reduce friction at the tip of the penis. You may also see some blood in the urine.  A very small amount of blood can make the urine look quite red.  As long as the catheter is draining well, there usually is not a problem.  However, if the catheter is not draining well and is bloody, you should call the office [phone]) to notify us.      General Anesthesia, Adult, Care After Refer to this sheet in the next few weeks. These instructions provide you with information on caring for yourself after your procedure. Your health care provider may also give you more specific instructions. Your treatment has been planned according to current medical practices, but problems sometimes occur. Call your health care provider if you have any problems or  questions after your procedure. WHAT TO EXPECT AFTER THE PROCEDURE After the procedure, it is typical to experience: Sleepiness. Nausea and vomiting. HOME CARE INSTRUCTIONS For the first 24 hours after general anesthesia: Have a responsible person with you. Do not drive a car. If you are alone, do not take public transportation. Do not drink alcohol. Do not take medicine that has not been prescribed by your health care provider. Do not sign important papers or make important decisions. You may resume a normal diet and activities as directed by your health care provider. Change bandages (dressings) as directed. If you have questions or problems that seem related to general anesthesia, call the hospital and ask for the anesthetist or anesthesiologist on call. SEEK MEDICAL CARE IF: You have nausea and vomiting that continue the day after anesthesia. You develop a rash. SEEK IMMEDIATE MEDICAL CARE IF:  You have difficulty breathing. You have chest pain. You have any allergic problems.   This information is not intended to replace advice given to you by your health care provider. Make sure you discuss any questions you have with your health care provider.   Document Released: 04/21/2000 Document Revised: 02/03/2014 Document Reviewed: 05/14/2011 Elsevier Interactive Patient Education [employer]. 4.

## 2015-03-12 NOTE — Transfer of Care (Signed)
Immediate Anesthesia Transfer of Care Note  Patient: Mitchell Lowery  Procedure(s) Performed: Procedure(s): CYSTOSCOPY WITH INSERTION OF UROLIFT (N/A)  Patient Location: PACU  Anesthesia Type:General  Level of Consciousness:  sedated, patient cooperative and responds to stimulation  Airway & Oxygen Therapy:Patient Spontanous Breathing and Patient connected to face mask oxgen  Post-op Assessment:  Report given to PACU RN and Post -op Vital signs reviewed and stable  Post vital signs:  Reviewed and stable  Last Vitals:  Filed Vitals:   03/12/15 0550  BP: 150/79  Pulse: 69  Temp: 36.9 C  Resp: 16    Complications: No apparent anesthesia complications

## 2015-03-12 NOTE — Progress Notes (Signed)
Unable to void after 2 attempts, pt does self cath at home.  Call placed to dr Diona Fanti, ok for pt to self cath

## 2015-03-12 NOTE — Anesthesia Procedure Notes (Signed)
Procedure Name: LMA Insertion Date/Time: 03/12/2015 8:53 AM Performed by: Maxwell Caul Pre-anesthesia Checklist: Patient identified, Emergency Drugs available, Suction available and Patient being monitored Patient Re-evaluated:Patient Re-evaluated prior to inductionOxygen Delivery Method: Circle system utilized Preoxygenation: Pre-oxygenation with 100% oxygen Intubation Type: IV induction LMA: LMA inserted LMA Size: 5.0 Number of attempts: 1 Tube secured with: Tape Dental Injury: Teeth and Oropharynx as per pre-operative assessment

## 2015-03-20 ENCOUNTER — Other Ambulatory Visit: Payer: Self-pay | Admitting: Medical Oncology

## 2015-03-21 NOTE — Telephone Encounter (Signed)
Pt returned call, he has "plenty" Revlimid to last until next office visit. Per [name], may adjust dose based on labs that day.

## 2015-03-21 NOTE — Telephone Encounter (Signed)
Received refill request for Revlimid. Left message with pt's wife requesting he call with pill count. Does he have enough to last until next visit?

## 2015-04-02 ENCOUNTER — Other Ambulatory Visit (HOSPITAL_BASED_OUTPATIENT_CLINIC_OR_DEPARTMENT_OTHER): Payer: Medicare Other

## 2015-04-02 ENCOUNTER — Other Ambulatory Visit: Payer: Self-pay | Admitting: Radiation Oncology

## 2015-04-02 ENCOUNTER — Telehealth: Payer: Self-pay | Admitting: Oncology

## 2015-04-02 ENCOUNTER — Ambulatory Visit (HOSPITAL_BASED_OUTPATIENT_CLINIC_OR_DEPARTMENT_OTHER): Payer: Medicare Other | Admitting: Oncology

## 2015-04-02 VITALS — BP 139/57 | HR 52 | Temp 98.0°F | Resp 17 | Ht 71.0 in | Wt 184.0 lb

## 2015-04-02 DIAGNOSIS — C9000 Multiple myeloma not having achieved remission: Secondary | ICD-10-CM

## 2015-04-02 DIAGNOSIS — C9001 Multiple myeloma in remission: Secondary | ICD-10-CM

## 2015-04-02 LAB — CBC WITH DIFFERENTIAL/PLATELET
BASO%: 1 % (ref 0.0–2.0)
BASOS ABS: 0 10*3/uL (ref 0.0–0.1)
EOS%: 6.3 % (ref 0.0–7.0)
Eosinophils Absolute: 0.2 10*3/uL (ref 0.0–0.5)
HCT: 41.2 % (ref 38.4–49.9)
HGB: 13 g/dL (ref 13.0–17.1)
LYMPH%: 23.3 % (ref 14.0–49.0)
MCH: 25.8 pg — AB (ref 27.2–33.4)
MCHC: 31.5 g/dL — AB (ref 32.0–36.0)
MCV: 81.7 fL (ref 79.3–98.0)
MONO#: 0.4 10*3/uL (ref 0.1–0.9)
MONO%: 14.1 % — ABNORMAL HIGH (ref 0.0–14.0)
NEUT#: 1.7 10*3/uL (ref 1.5–6.5)
NEUT%: 55.3 % (ref 39.0–75.0)
PLATELETS: 125 10*3/uL — AB (ref 140–400)
RBC: 5.04 10*6/uL (ref 4.20–5.82)
RDW: 16.9 % — ABNORMAL HIGH (ref 11.0–14.6)
WBC: 3 10*3/uL — ABNORMAL LOW (ref 4.0–10.3)
lymph#: 0.7 10*3/uL — ABNORMAL LOW (ref 0.9–3.3)

## 2015-04-02 NOTE — Progress Notes (Signed)
  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 He is now taking Revlimid at a dose of 10 mg every other day. He is tolerating this well. No diarrhea. No edema. His energy level has improved. He is exercising. He underwent a Urolift procedure by Dr. Diona Fanti on 03/12/2015. He no longer requires self-catheterization. He has discomfort after urination and bleeding since the procedure.  Objective:  Vital signs in last 24 hours:  Blood pressure 139/57, pulse 52, temperature 98 F (36.7 C), temperature source Oral, resp. rate 17, height 5' 11" (1.803 m), weight 184 lb (83.462 kg), SpO2 98 %.    HEENT: No thrush or ulcers Resp: Lungs clear bilaterally Cardio: Regular rate and rhythm, 2/6 systolic murmur GI: No hepatomegaly, no mass, nontender Vascular: The left lower leg is slightly larger than the right side, no edema   Lab Results:  Lab Results  Component Value Date   WBC 3.0* 04/02/2015   HGB 13.0 04/02/2015   HCT 41.2 04/02/2015   MCV 81.7 04/02/2015   PLT 125* 04/02/2015   NEUTROABS 1.7 04/02/2015    03/05/2015-serum M spike not observed IgA 117 Lambda free light chains 19.65, Or free light chains 13.93   Imaging:  No results found.  Medications: I have reviewed the patient's current medications.  Assessment/Plan: 1. Multiple myeloma-confirmed on a bone marrow biopsy 08/07/2014, IgA lambda  Serum M spike and increased serum free lambda light chains  Myeloma FISH panel negative for chromosome 4, 11, 12, 13, 14, and 17 abnormalities, cytogenetics with no metaphases  Bone survey 08/15/2014 with indeterminant lucent skull lesions  Cycle 1 RVD 08/22/2014  Cycle 2 RVD 09/19/2014  Cycle 3 RVD 10/17/2014  Cycle 4 RVD 11/14/2014 (Decadron reduced to 20 mg weekly, Revlimid 15 mg days 1 through 14, Velcade 3/4 weeks)  Cycle 5 RVD 12/12/2014  Serum M spike not detected  12/26/2014  Cycle 6 RVD 01/09/2015  Maintenance Revlimid, 10 mg daily, 02/05/2015 , discontinue 02/26/2015 secondary to leg edema and diarrhea  Revlimid resumed at a dose of 10 mg every other day beginning 03/13/2015  2. 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 History of intermittent prostatitis  4. Skin rash 10/24/2014-referred to dermatology, biopsy consistent with local hypersensitivity reaction  5. 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 He is tolerating the current dose of Revlimid well. He will continue Revlimid at a dose of 10 mg every other day. He will return for a CBC and myeloma panel on 05/09/2015. He plans to travel to [place] in mid April.  [name] will return for an office visit 06/26/2015. He will see Dr. Diona Fanti within the next few weeks.  He will continue prophylactic acyclovir for 3 more months.  [name], MD  04/02/2015  10:31 AM

## 2015-04-02 NOTE — Telephone Encounter (Signed)
Gave  Patient avs report and appointments for April and May. Per BS no lab needed for 4/5 - order cxd.

## 2015-04-05 ENCOUNTER — Telehealth: Payer: Self-pay | Admitting: *Deleted

## 2015-04-05 NOTE — Telephone Encounter (Signed)
Call from pt requesting Revlimid refill to be written for 28 tabs. Informed him refill must be written for one month supply, written for #14 tabs since taking every other day. He stated he will request to change back to daily at next visit.

## 2015-04-09 DIAGNOSIS — N390 Urinary tract infection, site not specified: Secondary | ICD-10-CM | POA: Diagnosis not present

## 2015-04-09 DIAGNOSIS — N320 Bladder-neck obstruction: Secondary | ICD-10-CM | POA: Diagnosis not present

## 2015-04-09 DIAGNOSIS — Z0000 Encounter for general adult medical examination without abnormal findings: Secondary | ICD-10-CM | POA: Diagnosis not present

## 2015-04-09 DIAGNOSIS — C61 Malignant neoplasm of prostate: Secondary | ICD-10-CM | POA: Diagnosis not present

## 2015-04-25 ENCOUNTER — Other Ambulatory Visit: Payer: Self-pay | Admitting: Oncology

## 2015-04-25 ENCOUNTER — Other Ambulatory Visit: Payer: Self-pay | Admitting: *Deleted

## 2015-04-25 DIAGNOSIS — C9000 Multiple myeloma not having achieved remission: Secondary | ICD-10-CM

## 2015-04-25 MED ORDER — LENALIDOMIDE 10 MG PO CAPS: 10.0000 mg | ORAL_CAPSULE | ORAL | Status: DC

## 2015-04-30 ENCOUNTER — Other Ambulatory Visit: Payer: Self-pay | Admitting: Oncology

## 2015-05-01 ENCOUNTER — Other Ambulatory Visit: Payer: Self-pay | Admitting: *Deleted

## 2015-05-01 NOTE — Telephone Encounter (Signed)
Received Revlimid refill request from [facility]. Noted Rx was faxed on 3/29. Called pharmacy, spoke with Anne Ng: They did receive Rx, medication is scheduled for delivery. Refill request denied as it is too soon to obtain auth from [place].

## 2015-05-03 DIAGNOSIS — D696 Thrombocytopenia, unspecified: Secondary | ICD-10-CM | POA: Diagnosis not present

## 2015-05-03 DIAGNOSIS — R030 Elevated blood-pressure reading, without diagnosis of hypertension: Secondary | ICD-10-CM | POA: Diagnosis not present

## 2015-05-03 DIAGNOSIS — C9000 Multiple myeloma not having achieved remission: Secondary | ICD-10-CM | POA: Diagnosis not present

## 2015-05-03 DIAGNOSIS — R159 Full incontinence of feces: Secondary | ICD-10-CM | POA: Diagnosis not present

## 2015-05-03 DIAGNOSIS — Z0001 Encounter for general adult medical examination with abnormal findings: Secondary | ICD-10-CM | POA: Diagnosis not present

## 2015-05-03 DIAGNOSIS — I499 Cardiac arrhythmia, unspecified: Secondary | ICD-10-CM | POA: Diagnosis not present

## 2015-05-03 DIAGNOSIS — R32 Unspecified urinary incontinence: Secondary | ICD-10-CM | POA: Diagnosis not present

## 2015-05-03 DIAGNOSIS — R011 Cardiac murmur, unspecified: Secondary | ICD-10-CM | POA: Diagnosis not present

## 2015-05-03 DIAGNOSIS — Z23 Encounter for immunization: Secondary | ICD-10-CM | POA: Diagnosis not present

## 2015-05-03 DIAGNOSIS — E559 Vitamin D deficiency, unspecified: Secondary | ICD-10-CM | POA: Diagnosis not present

## 2015-05-03 DIAGNOSIS — Z1389 Encounter for screening for other disorder: Secondary | ICD-10-CM | POA: Diagnosis not present

## 2015-05-03 DIAGNOSIS — N400 Enlarged prostate without lower urinary tract symptoms: Secondary | ICD-10-CM | POA: Diagnosis not present

## 2015-05-03 DIAGNOSIS — K627 Radiation proctitis: Secondary | ICD-10-CM | POA: Diagnosis not present

## 2015-05-09 ENCOUNTER — Telehealth: Payer: Self-pay | Admitting: *Deleted

## 2015-05-09 ENCOUNTER — Other Ambulatory Visit (HOSPITAL_BASED_OUTPATIENT_CLINIC_OR_DEPARTMENT_OTHER): Payer: Medicare Other

## 2015-05-09 DIAGNOSIS — C9001 Multiple myeloma in remission: Secondary | ICD-10-CM

## 2015-05-09 DIAGNOSIS — C61 Malignant neoplasm of prostate: Secondary | ICD-10-CM | POA: Diagnosis not present

## 2015-05-09 DIAGNOSIS — Z5111 Encounter for antineoplastic chemotherapy: Secondary | ICD-10-CM | POA: Diagnosis not present

## 2015-05-09 DIAGNOSIS — C9000 Multiple myeloma not having achieved remission: Secondary | ICD-10-CM

## 2015-05-09 DIAGNOSIS — Z0000 Encounter for general adult medical examination without abnormal findings: Secondary | ICD-10-CM | POA: Diagnosis not present

## 2015-05-09 LAB — BASIC METABOLIC PANEL
ANION GAP: 7 meq/L (ref 3–11)
BUN: 11.7 mg/dL (ref 7.0–26.0)
CO2: 28 mEq/L (ref 22–29)
CREATININE: 0.8 mg/dL (ref 0.7–1.3)
Calcium: 9.3 mg/dL (ref 8.4–10.4)
Chloride: 108 mEq/L (ref 98–109)
EGFR: 85 mL/min/{1.73_m2} — ABNORMAL LOW (ref >90)
Glucose: 89 mg/dl (ref 70–140)
POTASSIUM: 4.1 meq/L (ref 3.5–5.1)
SODIUM: 144 meq/L (ref 136–145)

## 2015-05-09 LAB — CBC WITH DIFFERENTIAL/PLATELET
BASO%: 2.1 % — AB (ref 0.0–2.0)
Basophils Absolute: 0.1 10*3/uL (ref 0.0–0.1)
EOS%: 8.3 % — ABNORMAL HIGH (ref 0.0–7.0)
Eosinophils Absolute: 0.3 10*3/uL (ref 0.0–0.5)
HCT: 41.5 % (ref 38.4–49.9)
HGB: 13.1 g/dL (ref 13.0–17.1)
LYMPH%: 18.1 % (ref 14.0–49.0)
MCH: 25.9 pg — ABNORMAL LOW (ref 27.2–33.4)
MCHC: 31.5 g/dL — ABNORMAL LOW (ref 32.0–36.0)
MCV: 82.4 fL (ref 79.3–98.0)
MONO#: 0.6 10*3/uL (ref 0.1–0.9)
MONO%: 17.7 % — AB (ref 0.0–14.0)
NEUT%: 53.8 % (ref 39.0–75.0)
NEUTROS ABS: 1.7 10*3/uL (ref 1.5–6.5)
Platelets: 123 10*3/uL — ABNORMAL LOW (ref 140–400)
RBC: 5.04 10*6/uL (ref 4.20–5.82)
RDW: 16.5 % — ABNORMAL HIGH (ref 11.0–14.6)
WBC: 3.2 10*3/uL — AB (ref 4.0–10.3)
lymph#: 0.6 10*3/uL — ABNORMAL LOW (ref 0.9–3.3)

## 2015-05-09 NOTE — Telephone Encounter (Signed)
-----   Message from Ladell Pier, MD sent at 05/09/2015  3:19 PM EDT ----- Please call patient, cbc looks good, continue revlimid, f/u as scheduled

## 2015-05-09 NOTE — Telephone Encounter (Signed)
Per [name], pt notified that cbc looks good and to continue revlimid as ordered.  Pt has no questions or concerns at this time and is appreciative of call.

## 2015-05-10 LAB — IGA: IgA, Qn, Serum: 167 mg/dL (ref 61–437)

## 2015-05-10 LAB — KAPPA/LAMBDA LIGHT CHAINS
IG KAPPA FREE LIGHT CHAIN: 14.24 mg/L (ref 3.30–19.40)
Ig Lambda Free Light Chain: 30.13 mg/L — ABNORMAL HIGH (ref 5.71–26.30)
Kappa/Lambda FluidC Ratio: 0.47 (ref 0.26–1.65)

## 2015-05-11 ENCOUNTER — Telehealth: Payer: Self-pay | Admitting: Medical Oncology

## 2015-05-11 DIAGNOSIS — C9000 Multiple myeloma not having achieved remission: Secondary | ICD-10-CM

## 2015-05-11 LAB — PROTEIN ELECTROPHORESIS, SERUM
A/G Ratio: 1.3 (ref 0.7–1.7)
ALPHA 2: 0.7 g/dL (ref 0.4–1.0)
Albumin: 3.3 g/dL (ref 2.9–4.4)
Alpha 1: 0.2 g/dL (ref 0.0–0.4)
BETA: 0.9 g/dL (ref 0.7–1.3)
GLOBULIN, TOTAL: 2.5 g/dL (ref 2.2–3.9)
Gamma Globulin: 0.6 g/dL (ref 0.4–1.8)
Total Protein: 5.8 g/dL — ABNORMAL LOW (ref 6.0–8.5)

## 2015-05-11 NOTE — Telephone Encounter (Signed)
Pt notified. He is going to [place] on April 20 -may 21 and will need revlimd refill soon after he returns. He wants to make sure he will have it in time to take next dose . I told him it will be taken care of . I left a message for [employer] to call back  to see if auth number  [id number]    from today will be good for next delivery. Pt requests rx be written for #28 tablets. I will not fax rx until I hear back from [facility].  I left a message for [facility] to call me or Dr Bernette Redbird nurse back with last dispense date.

## 2015-05-11 NOTE — Telephone Encounter (Signed)
-----   Message from Ladell Pier, MD sent at 05/10/2015  5:51 PM EDT ----- Please call patient, IGA is normal, lambda free light chains mildly elevated- but much lower than at presentation,  Cbc looks good, continue revlimid, f/u as scheduled

## 2015-05-16 ENCOUNTER — Other Ambulatory Visit: Payer: Self-pay | Admitting: Medical Oncology

## 2015-05-16 MED ORDER — LENALIDOMIDE 10 MG PO CAPS: 10.0000 mg | ORAL_CAPSULE | ORAL | Status: DC

## 2015-05-22 ENCOUNTER — Other Ambulatory Visit: Payer: Self-pay | Admitting: *Deleted

## 2015-05-23 ENCOUNTER — Telehealth: Payer: Self-pay | Admitting: *Deleted

## 2015-05-23 NOTE — Telephone Encounter (Signed)
Message from [place] with Mitchell Lowery recommending we obtain a new auth and prescription for pt on 5/19. Pt will return from his trip on 5/21. Auth # [id number] will expire prior to pt's return despite the over-ride info that was given previously.  Will leave reminder for nursing to obtain auth and prescription in May. (Pt is concerned that he won't have med in time.)

## 2015-06-15 ENCOUNTER — Other Ambulatory Visit: Payer: Self-pay | Admitting: *Deleted

## 2015-06-15 DIAGNOSIS — C9000 Multiple myeloma not having achieved remission: Secondary | ICD-10-CM

## 2015-06-15 MED ORDER — LENALIDOMIDE 10 MG PO CAPS: 10.0000 mg | ORAL_CAPSULE | ORAL | Status: DC

## 2015-06-15 NOTE — Telephone Encounter (Signed)
Message from [place] with [facility] calling with a courtesy reminder to obtain a new auth and prescription for pt's Revlimid. (Last auth expired.) Pt is returning from [place] 5/21. Rx refilled, per original order from [name].

## 2015-06-19 DIAGNOSIS — L309 Dermatitis, unspecified: Secondary | ICD-10-CM | POA: Diagnosis not present

## 2015-06-24 ENCOUNTER — Encounter (HOSPITAL_COMMUNITY): Payer: Self-pay | Admitting: Emergency Medicine

## 2015-06-24 ENCOUNTER — Emergency Department (HOSPITAL_BASED_OUTPATIENT_CLINIC_OR_DEPARTMENT_OTHER): Admit: 2015-06-24 | Discharge: 2015-06-24 | Disposition: A | Discharge status: Home / Self Care | Payer: Medicare Other

## 2015-06-24 ENCOUNTER — Emergency Department (HOSPITAL_COMMUNITY): Payer: Medicare Other

## 2015-06-24 ENCOUNTER — Emergency Department (HOSPITAL_COMMUNITY)
Admission: EM | Admit: 2015-06-24 | Discharge: 2015-06-24 | Disposition: A | Discharge status: Home / Self Care | Payer: Medicare Other | Attending: Emergency Medicine | Admitting: Emergency Medicine

## 2015-06-24 DIAGNOSIS — M7989 Other specified soft tissue disorders: Secondary | ICD-10-CM

## 2015-06-24 DIAGNOSIS — L03116 Cellulitis of left lower limb: Secondary | ICD-10-CM | POA: Insufficient documentation

## 2015-06-24 DIAGNOSIS — Z8546 Personal history of malignant neoplasm of prostate: Secondary | ICD-10-CM | POA: Diagnosis not present

## 2015-06-24 DIAGNOSIS — Z7982 Long term (current) use of aspirin: Secondary | ICD-10-CM | POA: Insufficient documentation

## 2015-06-24 DIAGNOSIS — M79662 Pain in left lower leg: Secondary | ICD-10-CM | POA: Diagnosis not present

## 2015-06-24 DIAGNOSIS — M79609 Pain in unspecified limb: Secondary | ICD-10-CM | POA: Diagnosis not present

## 2015-06-24 DIAGNOSIS — Z8579 Personal history of other malignant neoplasms of lymphoid, hematopoietic and related tissues: Secondary | ICD-10-CM | POA: Diagnosis not present

## 2015-06-24 DIAGNOSIS — R350 Frequency of micturition: Secondary | ICD-10-CM | POA: Diagnosis present

## 2015-06-24 LAB — COMPREHENSIVE METABOLIC PANEL
ALT: 16 U/L — ABNORMAL LOW (ref 17–63)
ANION GAP: 8 (ref 5–15)
AST: 20 U/L (ref 15–41)
Albumin: 3.6 g/dL (ref 3.5–5.0)
Alkaline Phosphatase: 42 U/L (ref 38–126)
BUN: 12 mg/dL (ref 6–20)
CHLORIDE: 104 mmol/L (ref 101–111)
CO2: 24 mmol/L (ref 22–32)
Calcium: 8.6 mg/dL — ABNORMAL LOW (ref 8.9–10.3)
Creatinine, Ser: 0.82 mg/dL (ref 0.61–1.24)
GFR calc non Af Amer: >60 mL/min (ref >60)
Glucose, Bld: 132 mg/dL — ABNORMAL HIGH (ref 65–99)
POTASSIUM: 3.3 mmol/L — AB (ref 3.5–5.1)
SODIUM: 136 mmol/L (ref 135–145)
Total Bilirubin: 1.3 mg/dL — ABNORMAL HIGH (ref 0.3–1.2)
Total Protein: 6.4 g/dL — ABNORMAL LOW (ref 6.5–8.1)

## 2015-06-24 LAB — CBC WITH DIFFERENTIAL/PLATELET
BASOS PCT: 0 %
Basophils Absolute: 0 10*3/uL (ref 0.0–0.1)
EOS ABS: 0 10*3/uL (ref 0.0–0.7)
EOS PCT: 0 %
HCT: 40 % (ref 39.0–52.0)
HEMOGLOBIN: 13.5 g/dL (ref 13.0–17.0)
LYMPHS PCT: 5 %
Lymphs Abs: 0.4 10*3/uL — ABNORMAL LOW (ref 0.7–4.0)
MCH: 27.2 pg (ref 26.0–34.0)
MCHC: 33.8 g/dL (ref 30.0–36.0)
MCV: 80.5 fL (ref 78.0–100.0)
Monocytes Absolute: 0.6 10*3/uL (ref 0.1–1.0)
Monocytes Relative: 8 %
NEUTROS PCT: 87 %
Neutro Abs: 6.6 10*3/uL (ref 1.7–7.7)
Platelets: 98 10*3/uL — ABNORMAL LOW (ref 150–400)
RBC: 4.97 MIL/uL (ref 4.22–5.81)
RDW: 14.9 % (ref 11.5–15.5)
WBC: 7.6 10*3/uL (ref 4.0–10.5)

## 2015-06-24 LAB — URINALYSIS, ROUTINE W REFLEX MICROSCOPIC
Bilirubin Urine: NEGATIVE
Glucose, UA: NEGATIVE mg/dL
KETONES UR: NEGATIVE mg/dL
LEUKOCYTES UA: NEGATIVE
NITRITE: NEGATIVE
PH: 7.5 (ref 5.0–8.0)
Protein, ur: 30 mg/dL — AB
Specific Gravity, Urine: 1.022 (ref 1.005–1.030)

## 2015-06-24 LAB — URINE MICROSCOPIC-ADD ON
Bacteria, UA: NONE SEEN
SQUAMOUS EPITHELIAL / LPF: NONE SEEN

## 2015-06-24 LAB — PROTIME-INR
INR: 1.19 (ref 0.00–1.49)
PROTHROMBIN TIME: 15.3 s — AB (ref 11.6–15.2)

## 2015-06-24 MED ORDER — SODIUM CHLORIDE 0.9 % IV BOLUS (SEPSIS)
500.0000 mL | Freq: Once | INTRAVENOUS | Status: AC
  Administered 2015-06-24: 500 mL via INTRAVENOUS

## 2015-06-24 MED ORDER — CEFAZOLIN SODIUM 1-5 GM-% IV SOLN
1.0000 g | Freq: Once | INTRAVENOUS | Status: AC
  Administered 2015-06-24: 1 g via INTRAVENOUS
  Filled 2015-06-24: qty 50

## 2015-06-24 MED ORDER — CEPHALEXIN 500 MG PO CAPS: 500.0000 mg | ORAL_CAPSULE | Freq: Four times a day (QID) | ORAL | Status: DC

## 2015-06-24 NOTE — ED Notes (Signed)
Vascular ultra sound at bedside.

## 2015-06-24 NOTE — Progress Notes (Signed)
VASCULAR LAB PRELIMINARY  PRELIMINARY  PRELIMINARY  PRELIMINARY  Left lower extremity venous duplex completed.    Preliminary report:  There is no DVT or SVT noted in the left lower extremity. Interstitial fluid noted in the left calf.  Enlarged lymph node noted in the left groin. Non thrombosed varicose veins noted in the proximal calf.   Darrin Koman, RVT 06/24/2015, 6:32 PM

## 2015-06-24 NOTE — ED Notes (Signed)
MD at bedside. 

## 2015-06-24 NOTE — Discharge Instructions (Signed)

## 2015-06-24 NOTE — ED Notes (Signed)
Patient states he has urinary frequency with hx of frequent UTIs. Also reports a "bump on his left shin" Reports falling and hitting this shin a few weeks ago. Rash on left shin as well.

## 2015-06-24 NOTE — ED Provider Notes (Signed)
CSN: [id number]     Arrival date & time 06/24/15  1701 History   First MD Initiated Contact with Patient 06/24/15 1705     Chief Complaint  Patient presents with  . Urinary Frequency    HPI Pt recently took a trip back from [place].  Last night he started developing a fever.  He also started to urinate frequently.  He also noticed some tender lumps on the left shin.  He hit his shin a couple of weeks ago while getting onto the bus and developed a large bruise but that was several weeks ago and it had seemed to resolve.   No cough.  No shortness of breath.  Some nausea no vomiting.  No diarrhea or abdominal pain.  General malaise today and decreased appetite.  He was exposed to family members in [place] that had a viral illness but he was not affected. Past Medical History  Diagnosis Date  . Prostatitis   . Anal fistula     treated in [place]  . Hemorrhoids   . Heart murmur   . Prostate cancer [facility])     urinary retention, surgery planned-prior radiation, and seed implant about 1 year ago.  . Multiple myeloma ([place])     1 month remission now-last tx. 1 month ago- /[name]  . UTI (lower urinary tract infection)     multiple urinary trract infection- being tx presently with antibiotic at present.  . Anemia     only due to myeloma- "normal now"   Past Surgical History  Procedure Laterality Date  . Prostate biopsy    . Prostate biopsy    . Anal fistulectomy    . Ankle surgery    . Radioactive seed implant N/A 07/21/2014    Procedure: RADIOACTIVE SEED IMPLANT/BRACHYTHERAPY IMPLANT;  Surgeon: Franchot Gallo, MD;  Location: [facility];  Service: Urology;  Laterality: N/A;  . Tonsillectomy    . Cystoscopy    . Cystoscopy with insertion of urolift N/A 03/12/2015    Procedure: CYSTOSCOPY WITH INSERTION OF UROLIFT;  Surgeon: Franchot Gallo, MD;  Location: [facility];  Service: Urology;  Laterality: N/A;   Family History  Problem Relation Age of Onset  . Cancer  Maternal Grandfather     mouth/throat ca   Social History  Substance Use Topics  . Smoking status: Never Smoker   . Smokeless tobacco: Never Used  . Alcohol Use: Yes     Comment: 1 glass wine daily    Review of Systems  All other systems reviewed and are negative.     Allergies  Review of patient's allergies indicates no known allergies.  Home Medications   Prior to Admission medications   Medication Sig Start Date End Date Taking? Authorizing Provider  acetaminophen (TYLENOL) 500 MG tablet Take 500 mg by mouth every 6 (six) hours as needed for mild pain or moderate pain.   Yes Historical Provider, MD  acyclovir (ZOVIRAX) 400 MG tablet Take 1 tablet (400 mg total) by mouth 2 (two) times daily. 02/13/15  Yes Ladell Pier, MD  aspirin 81 MG tablet Take 1 tablet (81 mg total) by mouth daily. 08/16/14  Yes Ladell Pier, MD  ciprofloxacin (CIPRO) 500 MG tablet Take 500 mg by mouth 2 (two) times daily.   Yes Historical Provider, MD  lenalidomide (REVLIMID) 10 MG capsule Take 1 capsule (10 mg total) by mouth every other day. 06/15/15  Yes Ladell Pier, MD  cephALEXin (KEFLEX) 500 MG capsule Take 1  capsule (500 mg total) by mouth 4 (four) times daily. 06/24/15   Dorie Rank, MD   BP 139/66 mmHg  Pulse 60  Temp(Src) 98.8 F (37.1 C) (Oral)  Resp 18  Ht '5\' 10"'  (1.778 m)  Wt 83.462 kg  BMI 26.40 kg/m2  SpO2 98% Physical Exam  Constitutional: He appears well-developed and well-nourished. No distress.  HENT:  Head: Normocephalic and atraumatic.  Right Ear: External ear normal.  Left Ear: External ear normal.  Mouth/Throat: No oropharyngeal exudate.  Eyes: Conjunctivae are normal. Right eye exhibits no discharge. Left eye exhibits no discharge. No scleral icterus.  Neck: Neck supple. No tracheal deviation present.  Cardiovascular: Normal rate, regular rhythm and intact distal pulses.   Pulmonary/Chest: Effort normal and breath sounds normal. No stridor. No respiratory distress.  He has no wheezes. He has no rales.  Abdominal: Soft. Bowel sounds are normal. He exhibits no distension. There is no tenderness. There is no rebound and no guarding.  Musculoskeletal: He exhibits edema and tenderness.  Erythema of the left lower leg below the knee, tenderness to palpation, no lymphangitic streaking  Neurological: He is alert. He has normal strength. No cranial nerve deficit (no facial droop, extraocular movements intact, no slurred speech) or sensory deficit. He exhibits normal muscle tone. He displays no seizure activity. Coordination normal.  Skin: Skin is warm and dry. No rash noted.  Psychiatric: He has a normal mood and affect.  Nursing note and vitals reviewed.   ED Course  Procedures  Medications  ceFAZolin (ANCEF) IVPB 1 g/50 mL premix (1 g Intravenous New Bag/Given 06/24/15 1920)  sodium chloride 0.9 % bolus 500 mL (0 mLs Intravenous Stopped 06/24/15 1854)    Labs Review Labs Reviewed  COMPREHENSIVE METABOLIC PANEL - Abnormal; Notable for the following:    Potassium 3.3 (*)    Glucose, Bld 132 (*)    Calcium 8.6 (*)    Total Protein 6.4 (*)    ALT 16 (*)    Total Bilirubin 1.3 (*)    All other components within normal limits  CBC WITH DIFFERENTIAL/PLATELET - Abnormal; Notable for the following:    Platelets 98 (*)    Lymphs Abs 0.4 (*)    All other components within normal limits  PROTIME-INR - Abnormal; Notable for the following:    Prothrombin Time 15.3 (*)    All other components within normal limits  URINALYSIS, ROUTINE W REFLEX MICROSCOPIC (NOT AT [facility]) - Abnormal; Notable for the following:    Color, Urine AMBER (*)    Hgb urine dipstick TRACE (*)    Protein, ur 30 (*)    All other components within normal limits  URINE MICROSCOPIC-ADD ON    Imaging Review Dg Tibia/fibula Left  06/24/2015  CLINICAL DATA:  81 year old male with pain in the left lower extremity, and redness for the past 24 hours. Recent 14 hour plane ride to [place]. EXAM:  LEFT TIBIA AND FIBULA - 2 VIEW COMPARISON:  No priors. FINDINGS: There is no evidence of fracture or other focal bone lesions. Soft tissues are unremarkable. IMPRESSION: Negative. Electronically Signed   By: Vinnie Langton M.D.   On: 06/24/2015 17:46   Doppler study : Negative for DVT LLE  I have personally reviewed and evaluated these images and lab results as part of my medical decision-making.    MDM   Final diagnoses:  Cellulitis of left lower extremity    Patient's laboratory tests are reassuring. X-ray of the leg does not show any evidence  of any bony abnormality. Doppler study does not show evidence of DVT.  Clinically the patient appears to have a cellulitis. I think this is the source of his malaise and fevers at home.  Plan on a dose of IV Ancef and discharged home with prescription for Keflex. Follow up with his primary doctor this week to make sure the symptoms are improving. We discussed monitoring for signs of worsening infection    Dorie Rank, MD 06/24/15 1929

## 2015-06-26 ENCOUNTER — Other Ambulatory Visit (HOSPITAL_BASED_OUTPATIENT_CLINIC_OR_DEPARTMENT_OTHER): Payer: Medicare Other

## 2015-06-26 ENCOUNTER — Telehealth: Payer: Self-pay | Admitting: Oncology

## 2015-06-26 ENCOUNTER — Ambulatory Visit (HOSPITAL_BASED_OUTPATIENT_CLINIC_OR_DEPARTMENT_OTHER): Payer: Medicare Other | Admitting: Oncology

## 2015-06-26 VITALS — BP 120/63 | HR 62 | Temp 98.3°F | Resp 18 | Ht 70.0 in | Wt 176.1 lb

## 2015-06-26 DIAGNOSIS — C9000 Multiple myeloma not having achieved remission: Secondary | ICD-10-CM

## 2015-06-26 DIAGNOSIS — C9001 Multiple myeloma in remission: Secondary | ICD-10-CM

## 2015-06-26 LAB — CBC WITH DIFFERENTIAL/PLATELET
BASO%: 1 % (ref 0.0–2.0)
BASOS ABS: 0 10*3/uL (ref 0.0–0.1)
EOS%: 3.2 % (ref 0.0–7.0)
Eosinophils Absolute: 0.1 10*3/uL (ref 0.0–0.5)
HCT: 41.8 % (ref 38.4–49.9)
HGB: 13.4 g/dL (ref 13.0–17.1)
LYMPH%: 11.4 % — ABNORMAL LOW (ref 14.0–49.0)
MCH: 26.4 pg — AB (ref 27.2–33.4)
MCHC: 32.1 g/dL (ref 32.0–36.0)
MCV: 82.2 fL (ref 79.3–98.0)
MONO#: 0.6 10*3/uL (ref 0.1–0.9)
MONO%: 16.6 % — AB (ref 0.0–14.0)
NEUT#: 2.6 10*3/uL (ref 1.5–6.5)
NEUT%: 67.8 % (ref 39.0–75.0)
Platelets: 114 10*3/uL — ABNORMAL LOW (ref 140–400)
RBC: 5.09 10*6/uL (ref 4.20–5.82)
RDW: 16.2 % — ABNORMAL HIGH (ref 11.0–14.6)
WBC: 3.9 10*3/uL — ABNORMAL LOW (ref 4.0–10.3)
lymph#: 0.4 10*3/uL — ABNORMAL LOW (ref 0.9–3.3)

## 2015-06-26 NOTE — Telephone Encounter (Signed)
Gave and printed appt sched and avs for pt for June °

## 2015-06-26 NOTE — Progress Notes (Signed)
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 He returned one week ago from a month-long trip to [place]. He reports feeling well on the trip. Good energy level. He injured the left lower leg when getting on a bus in [place]. He does not believe he broke the skin. The leg was bruised.  He developed the acute onset of fever/chills and erythema at the left lower leg on 06/24/2015. He was seen in the emergency room. A Doppler of the left leg revealed no evidence of deep vein or superficial thrombosis. He was diagnosed with cellulitis and started Keflex. He had been gardening on the day before he developed a fever and leg erythema.  The left leg erythema has improved. His fever is better.  He no longer has dysuria. He continues Revlimid every other day. He has some diarrhea.  Objective:  Vital signs in last 24 hours:  Blood pressure 120/63, pulse 62, temperature 98.3 F (36.8 C), temperature source Oral, resp. rate 18, height _0  (1.778 m), weight 176 lb 1.6 oz (79.878 kg), SpO2 99 %.    HEENT: No thrush or ulcers Resp: Lungs clear bilaterally Cardio: Regular rate and rhythm GI: No hepatosplenomegaly, no mass, nontender Vascular: Trace edema at the left lower leg  Skin: Mild erythema at the left pretibial area, appears to be fading     Lab Results:  Lab Results  Component Value Date   WBC 3.9* 06/26/2015   HGB 13.4 06/26/2015   HCT 41.8 06/26/2015   MCV 82.2 06/26/2015   PLT 114* 06/26/2015   NEUTROABS 2.6 06/26/2015      Imaging:  Dg Tibia/fibula Left  06/24/2015  CLINICAL DATA:  81 year old male with pain in the left lower extremity, and redness for the past 24 hours. Recent 14 hour plane ride to [place]. EXAM: LEFT TIBIA AND FIBULA - 2 VIEW COMPARISON:  No priors. FINDINGS: There is no evidence of fracture or other focal bone lesions. Soft tissues are unremarkable.  IMPRESSION: Negative. Electronically Signed   By: Vinnie Langton M.D.   On: 06/24/2015 17:46    Medications: I have reviewed the patient's current medications.  Assessment/Plan: 1. Multiple myeloma-confirmed on a bone marrow biopsy 08/07/2014, IgA lambda  Serum M spike and increased serum free lambda light chains  Myeloma FISH panel negative for chromosome 4, 11, 12, 13, 14, and 17 abnormalities, cytogenetics with no metaphases  Bone survey 08/15/2014 with indeterminant lucent skull lesions  Cycle 1 RVD 08/22/2014  Cycle 2 RVD 09/19/2014  Cycle 3 RVD 10/17/2014  Cycle 4 RVD 11/14/2014 (Decadron reduced to 20 mg weekly, Revlimid 15 mg days 1 through 14, Velcade 3/4 weeks)  Cycle 5 RVD 12/12/2014  Serum M spike not detected 12/26/2014  Cycle 6 RVD 01/09/2015  Maintenance Revlimid, 10 mg daily, 02/05/2015 , discontinue 02/26/2015 secondary to leg edema and diarrhea  Revlimid resumed at a dose of 10 mg every other day beginning 03/13/2015  2. 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 History of intermittent prostatitis  4. Skin rash 10/24/2014-referred to dermatology, biopsy consistent with local hypersensitivity reaction  5. 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   Left lower leg cellulitis 06/24/2015-treated with Keflex  7.   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 He is stable from a hematologic standpoint. He will return for a myeloma panel in 3 weeks.  He has been diagnosed with a left lower extremity cellulitis. He will continue Keflex. He will contact us for persistent fever or progressive erythema.  [name] will  return for an office visit in 3 weeks.  [name], MD  06/26/2015  11:21 AM

## 2015-06-27 ENCOUNTER — Telehealth: Payer: Self-pay | Admitting: Oncology

## 2015-06-27 ENCOUNTER — Telehealth: Payer: Self-pay | Admitting: *Deleted

## 2015-06-27 DIAGNOSIS — C61 Malignant neoplasm of prostate: Secondary | ICD-10-CM

## 2015-06-27 MED ORDER — POTASSIUM CHLORIDE ER 10 MEQ PO TBCR
10.0000 meq | EXTENDED_RELEASE_TABLET | Freq: Every day | ORAL | Status: DC
Start: 2015-06-27 — End: 2015-06-27

## 2015-06-27 MED ORDER — POTASSIUM CHLORIDE ER 10 MEQ PO TBCR: 10.0000 meq | EXTENDED_RELEASE_TABLET | Freq: Every day | ORAL | Status: DC

## 2015-06-27 NOTE — Telephone Encounter (Signed)
Called Dr. Alan Ripper office: Pt requesting to have labs done in this office with scheduled labs. Pt needs testosterone and PSA drawn. Order entered, per [name]. Potassium supplement e-scribed to pt's local pharmacy.

## 2015-06-27 NOTE — Telephone Encounter (Signed)
cld & spoke to pt and gave pt r/s time & date fo lab moved by Dr Benay Spice 6/19@8 

## 2015-06-29 ENCOUNTER — Telehealth: Payer: Self-pay | Admitting: Oncology

## 2015-06-29 ENCOUNTER — Telehealth: Payer: Self-pay | Admitting: *Deleted

## 2015-06-29 NOTE — Telephone Encounter (Signed)
left msg confirming 6/5 apt per pof

## 2015-06-29 NOTE — Telephone Encounter (Signed)
Message from pt reporting redness on LLE does not seem to be improving. He will complete antibiotics on 6/4. Asks if he should take another round of antibiotics or be seen in office again. Reviewed with [name]: Work in to be seen on 6/5. Order to schedulers. Attempted to call pt, no answer.

## 2015-07-02 ENCOUNTER — Ambulatory Visit (HOSPITAL_BASED_OUTPATIENT_CLINIC_OR_DEPARTMENT_OTHER): Payer: Medicare Other | Admitting: Oncology

## 2015-07-02 ENCOUNTER — Telehealth: Payer: Self-pay | Admitting: Oncology

## 2015-07-02 VITALS — BP 134/77 | HR 68 | Temp 98.5°F | Resp 18 | Ht 70.0 in | Wt 178.0 lb

## 2015-07-02 DIAGNOSIS — C9001 Multiple myeloma in remission: Secondary | ICD-10-CM

## 2015-07-02 DIAGNOSIS — C9000 Multiple myeloma not having achieved remission: Secondary | ICD-10-CM

## 2015-07-02 DIAGNOSIS — E291 Testicular hypofunction: Secondary | ICD-10-CM | POA: Diagnosis not present

## 2015-07-02 DIAGNOSIS — D696 Thrombocytopenia, unspecified: Secondary | ICD-10-CM | POA: Diagnosis not present

## 2015-07-02 DIAGNOSIS — C61 Malignant neoplasm of prostate: Secondary | ICD-10-CM

## 2015-07-02 NOTE — Telephone Encounter (Signed)
per pof for pt to follow appts as sch

## 2015-07-02 NOTE — Progress Notes (Signed)
  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 He completed a course of Keflex earlier today. He has developed diarrhea since starting the Keflex. He reports between 5 and 10 loose stools per day. The left leg is less erythematous. No recurrent fever. Good appetite. He is concerned there is persistent nodular edema at the left pretibial area.  Objective:  Vital signs in last 24 hours:  Blood pressure 134/77, pulse 68, temperature 98.5 F (36.9 C), temperature source Oral, resp. rate 18, height _0  (1.778 m), weight 178 lb (80.74 kg), SpO2 99 %.    GI: Abdomen soft and nontender, no hepatomegaly Vascular: Trace edema throughout the left lower leg with erythema at the left pretibial area. There is firm induration within the central aspect of the erythema. The area of erythema appears diminished compared to when I saw him on 06/26/2015. No fluctuance.   Lab Results:  Lab Results  Component Value Date   WBC 3.9* 06/26/2015   HGB 13.4 06/26/2015   HCT 41.8 06/26/2015   MCV 82.2 06/26/2015   PLT 114* 06/26/2015   NEUTROABS 2.6 06/26/2015    Medications: I have reviewed the patient's current medications.  Assessment/Plan: 1. Multiple myeloma-confirmed on a bone marrow biopsy 08/07/2014, IgA lambda  Serum M spike and increased serum free lambda light chains  Myeloma FISH panel negative for chromosome 4, 11, 12, 13, 14, and 17 abnormalities, cytogenetics with no metaphases  Bone survey 08/15/2014 with indeterminant lucent skull lesions  Cycle 1 RVD 08/22/2014  Cycle 2 RVD 09/19/2014  Cycle 3 RVD 10/17/2014  Cycle 4 RVD 11/14/2014 (Decadron reduced to 20 mg weekly, Revlimid 15 mg days 1 through 14, Velcade 3/4 weeks)  Cycle 5 RVD 12/12/2014  Serum M spike not detected 12/26/2014  Cycle 6 RVD 01/09/2015  Maintenance Revlimid, 10 mg daily, 02/05/2015 ,  discontinue 02/26/2015 secondary to leg edema and diarrhea  Revlimid resumed at a dose of 10 mg every other day beginning 03/13/2015  2. 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 History of intermittent prostatitis  4. Skin rash 10/24/2014-referred to dermatology, biopsy consistent with local hypersensitivity reaction  5. 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 Left lower leg cellulitis 06/24/2015-treated with Keflex  7. 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 He completed a course of antibiotics per the left leg cellulitis. The cellulitis appears improved, but the erythema and induration have not completely resolved. He will remain off of antibiotics. He will contact us for a recurrent fever or progressive erythema/swelling of the left leg.  The diarrhea is most likely secondary to antibiotics. He will contact us if the diarrhea does not improve over the next few days. We will check a stool sample for the C. difficile toxin for persistent diarrhea.  [name] will return for an office visit as scheduled 07/18/2015.  [name], MD  07/02/2015  11:17 AM

## 2015-07-04 ENCOUNTER — Encounter: Payer: Self-pay | Admitting: Skilled Nursing Facility1

## 2015-07-04 NOTE — Progress Notes (Signed)
Subjective:     Patient ID: Mitchell Lowery, male   DOB: December 04, 1934, 81 y.o.   MRN: [id number]  HPI   Review of Systems     Objective:   Physical Exam To assist the pt with identifying dietary strategies to gain lost wt.    Assessment:     Pt identified as being malnourished due to lost wt. Pt contacted via the telephone at [phone]. Pt states he does not need any assistance with wt regain at this time.    Plan:     No plan identified at this time.

## 2015-07-09 ENCOUNTER — Other Ambulatory Visit: Payer: Self-pay | Admitting: Oncology

## 2015-07-16 ENCOUNTER — Other Ambulatory Visit (HOSPITAL_BASED_OUTPATIENT_CLINIC_OR_DEPARTMENT_OTHER): Payer: Medicare Other

## 2015-07-16 DIAGNOSIS — C61 Malignant neoplasm of prostate: Secondary | ICD-10-CM

## 2015-07-16 DIAGNOSIS — C9000 Multiple myeloma not having achieved remission: Secondary | ICD-10-CM

## 2015-07-16 DIAGNOSIS — C9001 Multiple myeloma in remission: Secondary | ICD-10-CM

## 2015-07-16 LAB — CBC WITH DIFFERENTIAL/PLATELET
BASO%: 1.7 % (ref 0.0–2.0)
BASOS ABS: 0 10*3/uL (ref 0.0–0.1)
EOS ABS: 0.2 10*3/uL (ref 0.0–0.5)
EOS%: 8.2 % — ABNORMAL HIGH (ref 0.0–7.0)
HCT: 41.6 % (ref 38.4–49.9)
HEMOGLOBIN: 13.5 g/dL (ref 13.0–17.1)
LYMPH%: 27.3 % (ref 14.0–49.0)
MCH: 26.8 pg — AB (ref 27.2–33.4)
MCHC: 32.5 g/dL (ref 32.0–36.0)
MCV: 82.5 fL (ref 79.3–98.0)
MONO#: 0.4 10*3/uL (ref 0.1–0.9)
MONO%: 15.6 % — AB (ref 0.0–14.0)
NEUT#: 1.1 10*3/uL — ABNORMAL LOW (ref 1.5–6.5)
NEUT%: 47.2 % (ref 39.0–75.0)
Platelets: 104 10*3/uL — ABNORMAL LOW (ref 140–400)
RBC: 5.05 10*6/uL (ref 4.20–5.82)
RDW: 16 % — ABNORMAL HIGH (ref 11.0–14.6)
WBC: 2.4 10*3/uL — ABNORMAL LOW (ref 4.0–10.3)
lymph#: 0.7 10*3/uL — ABNORMAL LOW (ref 0.9–3.3)

## 2015-07-16 LAB — COMPREHENSIVE METABOLIC PANEL
ALBUMIN: 3.3 g/dL — AB (ref 3.5–5.0)
ALT: 14 U/L (ref 0–55)
AST: 16 U/L (ref 5–34)
Alkaline Phosphatase: 46 U/L (ref 40–150)
Anion Gap: 9 mEq/L (ref 3–11)
BUN: 8.4 mg/dL (ref 7.0–26.0)
CO2: 25 meq/L (ref 22–29)
Calcium: 9.3 mg/dL (ref 8.4–10.4)
Chloride: 108 mEq/L (ref 98–109)
Creatinine: 0.8 mg/dL (ref 0.7–1.3)
EGFR: 87 mL/min/{1.73_m2} — AB (ref >90)
GLUCOSE: 109 mg/dL (ref 70–140)
POTASSIUM: 3.7 meq/L (ref 3.5–5.1)
SODIUM: 143 meq/L (ref 136–145)
Total Bilirubin: 0.73 mg/dL (ref 0.20–1.20)
Total Protein: 6.4 g/dL (ref 6.4–8.3)

## 2015-07-17 ENCOUNTER — Other Ambulatory Visit: Payer: Medicare Other

## 2015-07-17 ENCOUNTER — Other Ambulatory Visit: Payer: Self-pay | Admitting: *Deleted

## 2015-07-17 LAB — PSA

## 2015-07-17 LAB — TESTOSTERONE: TESTOSTERONE: 17 ng/dL — AB (ref 348–1197)

## 2015-07-17 LAB — KAPPA/LAMBDA LIGHT CHAINS
IG KAPPA FREE LIGHT CHAIN: 15.4 mg/L (ref 3.3–19.4)
IG LAMBDA FREE LIGHT CHAIN: 52.5 mg/L — AB (ref 5.7–26.3)
Kappa/Lambda FluidC Ratio: 0.29 (ref 0.26–1.65)

## 2015-07-17 LAB — IGA: IGA/IMMUNOGLOBULIN A, SERUM: 260 mg/dL (ref 61–437)

## 2015-07-17 MED ORDER — LENALIDOMIDE 10 MG PO CAPS: ORAL_CAPSULE | ORAL | Status: DC

## 2015-07-17 NOTE — Telephone Encounter (Signed)
Received fax from [facility]: Please obtain auth for Revlimid Rx. Prescription e-prescribed with updated auth #.

## 2015-07-18 ENCOUNTER — Other Ambulatory Visit: Payer: Self-pay | Admitting: *Deleted

## 2015-07-18 ENCOUNTER — Ambulatory Visit (HOSPITAL_BASED_OUTPATIENT_CLINIC_OR_DEPARTMENT_OTHER): Payer: Medicare Other | Admitting: Oncology

## 2015-07-18 ENCOUNTER — Telehealth: Payer: Self-pay | Admitting: Oncology

## 2015-07-18 VITALS — BP 125/62 | HR 56 | Temp 98.5°F | Resp 18 | Ht 70.0 in | Wt 177.0 lb

## 2015-07-18 DIAGNOSIS — D701 Agranulocytosis secondary to cancer chemotherapy: Secondary | ICD-10-CM | POA: Diagnosis not present

## 2015-07-18 DIAGNOSIS — N401 Enlarged prostate with lower urinary tract symptoms: Secondary | ICD-10-CM | POA: Diagnosis not present

## 2015-07-18 DIAGNOSIS — C9000 Multiple myeloma not having achieved remission: Secondary | ICD-10-CM

## 2015-07-18 DIAGNOSIS — D6959 Other secondary thrombocytopenia: Secondary | ICD-10-CM

## 2015-07-18 DIAGNOSIS — R338 Other retention of urine: Secondary | ICD-10-CM | POA: Diagnosis not present

## 2015-07-18 DIAGNOSIS — C61 Malignant neoplasm of prostate: Secondary | ICD-10-CM | POA: Diagnosis not present

## 2015-07-18 MED ORDER — LENALIDOMIDE 10 MG PO CAPS: ORAL_CAPSULE | ORAL | Status: DC

## 2015-07-18 NOTE — Progress Notes (Signed)
  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 He continues every other day Revlimid. He reports mild diarrhea. The left leg erythema has resolved. No fever or pain. He has returned to exercising. No difficulty with urination.  Objective:  Vital signs in last 24 hours:  Blood pressure 125/62, pulse 56, temperature 98.5 F (36.9 C), temperature source Oral, resp. rate 18, height '5\' 10"'$  (1.778 m), weight 177 lb (80.287 kg), SpO2 97 %.    HEENT: No thrush or ulcers Resp: Lungs clear bilaterally Cardio: Regular rate and rhythm GI: No hepatosplenomegaly, nontender Vascular: Trace edema at the left lower leg  Skin: No erythema at the left pretibial area     Lab Results:  Lab Results  Component Value Date   WBC 2.4* 07/16/2015   HGB 13.5 07/16/2015   HCT 41.6 07/16/2015   MCV 82.5 07/16/2015   PLT 104* 07/16/2015   NEUTROABS 1.1* 07/16/2015  Potassium 3.7, creatinine 0.8, calcium 9.3 Lambda free light chains 52.5, IgA to 60  PSA less than 0.1    Medications: I have reviewed the patient's current medications.  Assessment/Plan: 1. Multiple myeloma-confirmed on a bone marrow biopsy 08/07/2014, IgA lambda  Serum M spike and increased serum free lambda light chains  Myeloma FISH panel negative for chromosome 4, 11, 12, 13, 14, and 17 abnormalities, cytogenetics with no metaphases  Bone survey 08/15/2014 with indeterminant lucent skull lesions  Cycle 1 RVD 08/22/2014  Cycle 2 RVD 09/19/2014  Cycle 3 RVD 10/17/2014  Cycle 4 RVD 11/14/2014 (Decadron reduced to 20 mg weekly, Revlimid 15 mg days 1 through 14, Velcade 3/4 weeks)  Cycle 5 RVD 12/12/2014  Serum M spike not detected 12/26/2014  Cycle 6 RVD 01/09/2015  Maintenance Revlimid, 10 mg daily, 02/05/2015 , discontinue 02/26/2015 secondary to leg edema and diarrhea  Revlimid resumed at a dose of 10 mg every other day  beginning 03/13/2015  2. 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 History of intermittent prostatitis  4. Skin rash 10/24/2014-referred to dermatology, biopsy consistent with local hypersensitivity reaction  5. 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 Left lower leg cellulitis 06/24/2015-treated with Keflex  7. Mild thrombocytopenia secondary to Revlimid versus myeloma  8.   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 The cellulitis has resolved. He has an excellent performance status. The serum free light chains and IgA have increased over the past few months. The hemoglobin remains in the normal range.  I suspect the myeloma is slowly progressing. We discussed treatment options. We specifically discussed increasing the Revlimid dose, adding back Decadron or Velcade, switching to an oral Proteozome inhibitor, Cytoxan, and carfilzomib. We also discussed referring him to consider stem cell therapy and for a second opinion with the myeloma team at [facility]. He does not wish to go for a second opinion at present.  We decided to continue Revlimid at the current dose. He will return for an office and lab visit in one month. He will contact us for a fever or new symptoms in the interim.  [name], MD  07/18/2015  12:01 PM

## 2015-07-18 NOTE — Telephone Encounter (Signed)
Gave pt cal & avs, Md off day on 7/19.Marland Kitchen Pt put w/ lisa

## 2015-07-30 ENCOUNTER — Encounter: Payer: Self-pay | Admitting: Oncology

## 2015-07-30 NOTE — Progress Notes (Signed)
sent prior auth to [facility]- [facility] approved revlimid 07/30/15-07/29/16 sent to medical records

## 2015-07-30 NOTE — Progress Notes (Signed)
sent prior auth to [facility]

## 2015-08-08 ENCOUNTER — Other Ambulatory Visit: Payer: Self-pay | Admitting: Oncology

## 2015-08-14 ENCOUNTER — Other Ambulatory Visit (HOSPITAL_BASED_OUTPATIENT_CLINIC_OR_DEPARTMENT_OTHER): Payer: Medicare Other

## 2015-08-14 DIAGNOSIS — D701 Agranulocytosis secondary to cancer chemotherapy: Secondary | ICD-10-CM

## 2015-08-14 DIAGNOSIS — C9000 Multiple myeloma not having achieved remission: Secondary | ICD-10-CM | POA: Diagnosis not present

## 2015-08-14 LAB — CBC WITH DIFFERENTIAL/PLATELET
BASO%: 3.6 % — AB (ref 0.0–2.0)
Basophils Absolute: 0.1 10*3/uL (ref 0.0–0.1)
EOS%: 10.9 % — AB (ref 0.0–7.0)
Eosinophils Absolute: 0.3 10*3/uL (ref 0.0–0.5)
HCT: 42.8 % (ref 38.4–49.9)
HGB: 13.7 g/dL (ref 13.0–17.1)
LYMPH%: 21.2 % (ref 14.0–49.0)
MCH: 27.1 pg — ABNORMAL LOW (ref 27.2–33.4)
MCHC: 32.1 g/dL (ref 32.0–36.0)
MCV: 84.4 fL (ref 79.3–98.0)
MONO#: 0.4 10*3/uL (ref 0.1–0.9)
MONO%: 14.5 % — AB (ref 0.0–14.0)
NEUT%: 49.8 % (ref 39.0–75.0)
NEUTROS ABS: 1.4 10*3/uL — AB (ref 1.5–6.5)
Platelets: 126 10*3/uL — ABNORMAL LOW (ref 140–400)
RBC: 5.07 10*6/uL (ref 4.20–5.82)
RDW: 16.3 % — ABNORMAL HIGH (ref 11.0–14.6)
WBC: 2.8 10*3/uL — AB (ref 4.0–10.3)
lymph#: 0.6 10*3/uL — ABNORMAL LOW (ref 0.9–3.3)

## 2015-08-14 LAB — COMPREHENSIVE METABOLIC PANEL
ALT: 11 U/L (ref 0–55)
ANION GAP: 11 meq/L (ref 3–11)
AST: 15 U/L (ref 5–34)
Albumin: 3.4 g/dL — ABNORMAL LOW (ref 3.5–5.0)
Alkaline Phosphatase: 51 U/L (ref 40–150)
BUN: 10.4 mg/dL (ref 7.0–26.0)
CHLORIDE: 108 meq/L (ref 98–109)
CO2: 27 meq/L (ref 22–29)
CREATININE: 0.8 mg/dL (ref 0.7–1.3)
Calcium: 9.2 mg/dL (ref 8.4–10.4)
EGFR: 86 mL/min/{1.73_m2} — ABNORMAL LOW (ref >90)
GLUCOSE: 65 mg/dL — AB (ref 70–140)
Potassium: 3.6 mEq/L (ref 3.5–5.1)
SODIUM: 145 meq/L (ref 136–145)
Total Bilirubin: 0.72 mg/dL (ref 0.20–1.20)
Total Protein: 6.4 g/dL (ref 6.4–8.3)

## 2015-08-15 ENCOUNTER — Ambulatory Visit (HOSPITAL_BASED_OUTPATIENT_CLINIC_OR_DEPARTMENT_OTHER): Payer: Medicare Other | Admitting: Nurse Practitioner

## 2015-08-15 ENCOUNTER — Telehealth: Payer: Self-pay | Admitting: Oncology

## 2015-08-15 ENCOUNTER — Other Ambulatory Visit: Payer: Self-pay

## 2015-08-15 VITALS — BP 137/62 | HR 52 | Temp 98.4°F | Resp 18 | Ht 70.0 in | Wt 178.2 lb

## 2015-08-15 DIAGNOSIS — C9000 Multiple myeloma not having achieved remission: Secondary | ICD-10-CM

## 2015-08-15 DIAGNOSIS — D709 Neutropenia, unspecified: Secondary | ICD-10-CM

## 2015-08-15 DIAGNOSIS — D6959 Other secondary thrombocytopenia: Secondary | ICD-10-CM | POA: Diagnosis not present

## 2015-08-15 LAB — KAPPA/LAMBDA LIGHT CHAINS
IG KAPPA FREE LIGHT CHAIN: 14 mg/L (ref 3.3–19.4)
Ig Lambda Free Light Chain: 62.5 mg/L — ABNORMAL HIGH (ref 5.7–26.3)
KAPPA/LAMBDA FLC RATIO: 0.22 — AB (ref 0.26–1.65)

## 2015-08-15 LAB — IGA: IGA/IMMUNOGLOBULIN A, SERUM: 309 mg/dL (ref 61–437)

## 2015-08-15 MED ORDER — LENALIDOMIDE 10 MG PO CAPS: ORAL_CAPSULE | ORAL | Status: DC

## 2015-08-15 NOTE — Progress Notes (Addendum)
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 He continues every other day Revlimid. He overall feels well. No nausea or vomiting. No mouth sores. No change in baseline diarrhea. He takes one Lomotil every other day. No shortness of breath. He continues to note mild edema of the left lower leg. He has recently noticed several small draining skin lesions just lateral to the previous area of cellulitis. No fever.  Objective:  Vital signs in last 24 hours:  Blood pressure 137/62, pulse 52, temperature 98.4 F (36.9 C), temperature source Oral, resp. rate 18, height _0  (1.778 m), weight 178 lb 3.2 oz (80.831 kg), SpO2 99 %.    HEENT: No thrush or ulcers. Resp: Lungs clear bilaterally. Cardio: Regular rate and rhythm. GI: Abdomen soft and nontender. No hepatomegaly. Vascular: Trace pitting edema left lower leg.  Skin: Left lower leg with 4 small superficial skin lesions with no surrounding erythema.    Lab Results:  Lab Results  Component Value Date   WBC 2.8* 08/14/2015   HGB 13.7 08/14/2015   HCT 42.8 08/14/2015   MCV 84.4 08/14/2015   PLT 126* 08/14/2015   NEUTROABS 1.4* 08/14/2015    Imaging:  No results found.  Medications: I have reviewed the patient's current medications.  Assessment/Plan: 1. Multiple myeloma-confirmed on a bone marrow biopsy 08/07/2014, IgA lambda  Serum M spike and increased serum free lambda light chains  Myeloma FISH panel negative for chromosome 4, 11, 12, 13, 14, and 17 abnormalities, cytogenetics with no metaphases  Bone survey 08/15/2014 with indeterminant lucent skull lesions  Cycle 1 RVD 08/22/2014  Cycle 2 RVD 09/19/2014  Cycle 3 RVD 10/17/2014  Cycle 4 RVD 11/14/2014 (Decadron reduced to 20 mg weekly, Revlimid 15 mg days 1 through 14, Velcade 3/4 weeks)  Cycle 5 RVD 12/12/2014  Serum M spike not detected 12/26/2014  Cycle 6 RVD  01/09/2015  Maintenance Revlimid, 10 mg daily, 02/05/2015, discontinue 02/26/2015 secondary to leg edema and diarrhea  Revlimid resumed at a dose of 10 mg every other day beginning 03/13/2015  2. 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 History of intermittent prostatitis  4. Skin rash 10/24/2014-referred to dermatology, biopsy consistent with local hypersensitivity reaction  5. 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 Left lower leg cellulitis 06/24/2015-treated with Keflex  7. Mild thrombocytopenia secondary to Revlimid versus myeloma  8. Mild neutropenia-likely secondary to Revlimid versus myeloma     Disposition: 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 The serum free light chains and IgA have increased over the past several months. We will follow-up on the serum free light chains from yesterday. He understands the myeloma is likely slowly progressing. We again discussed treatment options. For now he will continue Revlimid at the current dose/schedule. [name] recommends a referral to Dr. Amalia Hailey at [facility] for a second opinion. [name] is in agreement.  The new small draining skin lesions at the left lower leg may be related to a previous hematoma. He understands to contact the office with any increase in the lesions, signs of infection.  He will return for a follow-up visit here on 09/05/2015. He will contact the office in the interim with any problems.  Patient seen with [name]. 25 minutes  were spent face-to-face at today's visit with the majority of that time involved in counseling/coordination of care.  Ned Card ANP/GNP-BC   08/15/2015  11:55 AM  This was a shared visit with Ned Card. 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 The IgA has increased over the past few months. We will follow-up on the light chain analysis from today. We referred him to Dr. Amalia Hailey for a second opinion. We discussed attention treatment options including Revlimid/Decadron, plasma, and other standard salvage treatment options. We also discussed himself therapy.  He will continue Revlimid for now. 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  Julieanne Manson, M.D.

## 2015-08-15 NOTE — Telephone Encounter (Signed)
Gave pt cal & avs °

## 2015-08-16 ENCOUNTER — Telehealth: Payer: Self-pay

## 2015-08-16 DIAGNOSIS — R32 Unspecified urinary incontinence: Secondary | ICD-10-CM | POA: Diagnosis not present

## 2015-08-16 DIAGNOSIS — D696 Thrombocytopenia, unspecified: Secondary | ICD-10-CM | POA: Diagnosis not present

## 2015-08-16 DIAGNOSIS — C9000 Multiple myeloma not having achieved remission: Secondary | ICD-10-CM | POA: Diagnosis not present

## 2015-08-16 DIAGNOSIS — R011 Cardiac murmur, unspecified: Secondary | ICD-10-CM | POA: Diagnosis not present

## 2015-08-16 DIAGNOSIS — E559 Vitamin D deficiency, unspecified: Secondary | ICD-10-CM | POA: Diagnosis not present

## 2015-08-16 DIAGNOSIS — I499 Cardiac arrhythmia, unspecified: Secondary | ICD-10-CM | POA: Diagnosis not present

## 2015-08-16 DIAGNOSIS — N400 Enlarged prostate without lower urinary tract symptoms: Secondary | ICD-10-CM | POA: Diagnosis not present

## 2015-08-16 DIAGNOSIS — I779 Disorder of arteries and arterioles, unspecified: Secondary | ICD-10-CM | POA: Diagnosis not present

## 2015-08-16 DIAGNOSIS — R159 Full incontinence of feces: Secondary | ICD-10-CM | POA: Diagnosis not present

## 2015-08-16 DIAGNOSIS — D649 Anemia, unspecified: Secondary | ICD-10-CM | POA: Diagnosis not present

## 2015-08-16 DIAGNOSIS — R030 Elevated blood-pressure reading, without diagnosis of hypertension: Secondary | ICD-10-CM | POA: Diagnosis not present

## 2015-08-16 DIAGNOSIS — C61 Malignant neoplasm of prostate: Secondary | ICD-10-CM | POA: Diagnosis not present

## 2015-08-16 NOTE — Telephone Encounter (Signed)
Called and informed pt of lab results, to continue revlimid and follow up as scheduled. Pt verbalized understanding and denies any questions or concerns at this time.

## 2015-08-16 NOTE — Telephone Encounter (Signed)
-----   Message from Ladell Pier, MD sent at 08/15/2015  8:25 PM EDT ----- Please call patient, lambda light mildly elevated, continue revilimid, f/u as scheduled

## 2015-08-17 ENCOUNTER — Telehealth: Payer: Self-pay | Admitting: Oncology

## 2015-08-17 NOTE — Telephone Encounter (Signed)
Faxed pt medical records to Dr. Phoebe Perch office. Dr. Phoebe Perch office will call pt with appt.

## 2015-08-29 ENCOUNTER — Telehealth: Payer: Self-pay | Admitting: *Deleted

## 2015-08-29 NOTE — Telephone Encounter (Signed)
Message from pt following up on referral to [facility]. Discussed with Maudie Mercury in [facility], she re-faxed referral.

## 2015-09-03 ENCOUNTER — Other Ambulatory Visit: Payer: Self-pay | Admitting: Oncology

## 2015-09-05 ENCOUNTER — Ambulatory Visit (HOSPITAL_BASED_OUTPATIENT_CLINIC_OR_DEPARTMENT_OTHER): Payer: Medicare Other | Admitting: Oncology

## 2015-09-05 ENCOUNTER — Telehealth: Payer: Self-pay | Admitting: *Deleted

## 2015-09-05 ENCOUNTER — Other Ambulatory Visit (HOSPITAL_BASED_OUTPATIENT_CLINIC_OR_DEPARTMENT_OTHER): Payer: Medicare Other

## 2015-09-05 ENCOUNTER — Telehealth: Payer: Self-pay | Admitting: Oncology

## 2015-09-05 ENCOUNTER — Other Ambulatory Visit: Payer: Self-pay | Admitting: *Deleted

## 2015-09-05 VITALS — BP 134/62 | HR 59 | Temp 97.9°F | Resp 18 | Ht 70.0 in | Wt 176.7 lb

## 2015-09-05 DIAGNOSIS — C9000 Multiple myeloma not having achieved remission: Secondary | ICD-10-CM

## 2015-09-05 DIAGNOSIS — R197 Diarrhea, unspecified: Secondary | ICD-10-CM

## 2015-09-05 LAB — CBC WITH DIFFERENTIAL/PLATELET
BASO%: 2.5 % — AB (ref 0.0–2.0)
BASOS ABS: 0.1 10*3/uL (ref 0.0–0.1)
EOS ABS: 0.3 10*3/uL (ref 0.0–0.5)
EOS%: 12 % — ABNORMAL HIGH (ref 0.0–7.0)
HEMATOCRIT: 42.3 % (ref 38.4–49.9)
HEMOGLOBIN: 13.7 g/dL (ref 13.0–17.1)
LYMPH%: 17 % (ref 14.0–49.0)
MCH: 27.2 pg (ref 27.2–33.4)
MCHC: 32.5 g/dL (ref 32.0–36.0)
MCV: 83.5 fL (ref 79.3–98.0)
MONO#: 0.4 10*3/uL (ref 0.1–0.9)
MONO%: 15.7 % — ABNORMAL HIGH (ref 0.0–14.0)
NEUT%: 52.8 % (ref 39.0–75.0)
NEUTROS ABS: 1.5 10*3/uL (ref 1.5–6.5)
PLATELETS: 128 10*3/uL — AB (ref 140–400)
RBC: 5.06 10*6/uL (ref 4.20–5.82)
RDW: 15.6 % — ABNORMAL HIGH (ref 11.0–14.6)
WBC: 2.8 10*3/uL — AB (ref 4.0–10.3)
lymph#: 0.5 10*3/uL — ABNORMAL LOW (ref 0.9–3.3)

## 2015-09-05 LAB — COMPREHENSIVE METABOLIC PANEL
ALBUMIN: 3.4 g/dL — AB (ref 3.5–5.0)
ALK PHOS: 49 U/L (ref 40–150)
ALT: 10 U/L (ref 0–55)
ANION GAP: 8 meq/L (ref 3–11)
AST: 15 U/L (ref 5–34)
BILIRUBIN TOTAL: 0.67 mg/dL (ref 0.20–1.20)
BUN: 12.7 mg/dL (ref 7.0–26.0)
CO2: 30 meq/L — AB (ref 22–29)
CREATININE: 0.8 mg/dL (ref 0.7–1.3)
Calcium: 9.5 mg/dL (ref 8.4–10.4)
Chloride: 106 mEq/L (ref 98–109)
EGFR: 86 mL/min/{1.73_m2} — AB (ref >90)
GLUCOSE: 84 mg/dL (ref 70–140)
Potassium: 3.8 mEq/L (ref 3.5–5.1)
Sodium: 143 mEq/L (ref 136–145)
TOTAL PROTEIN: 6.6 g/dL (ref 6.4–8.3)

## 2015-09-05 NOTE — Telephone Encounter (Signed)
per of to sch pt ppt-*gave pt copy of avs

## 2015-09-05 NOTE — Telephone Encounter (Signed)
Opened encounter in error  

## 2015-09-05 NOTE — Telephone Encounter (Signed)
Call placed to pt to notify him that per Dr. Phoebe Perch office, appt has been set for 09/25/15 at 1:15PM. Pt instructed that someone from Dr. Phoebe Perch office will be contacting him regarding details of appt per Remo Lipps RN in triage.  Pt appreciative of call.

## 2015-09-05 NOTE — Progress Notes (Signed)
  [place] OFFICE PROGRESS NOTE   Diagnosis: Multiple myeloma  INTERVAL HISTORY:   Mitchell Lowery returns as scheduled. He feels well. He is exercising. No fever or pain. He has intermittent diarrhea, improved with Lomotil. He continues Revlimid.  Objective:  Vital signs in last 24 hours:  Blood pressure 134/62, pulse (!) 59, temperature 97.9 F (36.6 C), temperature source Oral, resp. rate 18, height '5\' 10"'$  (1.778 m), weight 176 lb 11.2 oz (80.2 kg), SpO2 98 %.    HEENT: No thrush Resp: Lungs clear bilaterally Cardio: Regular rate and rhythm GI: No hepatosplenomegaly Vascular: The left lower leg is slightly larger than the right side, no erythema   Lab Results:  Lab Results  Component Value Date   WBC 2.8 (L) 09/05/2015   HGB 13.7 09/05/2015   HCT 42.3 09/05/2015   MCV 83.5 09/05/2015   PLT 128 (L) 09/05/2015   NEUTROABS 1.5 09/05/2015   08/14/2015: IgA 309, lambda light chain 62.5  Medications: I have reviewed the patient's current medications.  Assessment/Plan: 1. Multiple myeloma-confirmed on a bone marrow biopsy 08/07/2014, IgA lambda  Serum M spike and increased serum free lambda light chains  Myeloma FISH panel negative for chromosome 4, 11, 12, 13, 14, and 17 abnormalities, cytogenetics with no metaphases  Bone survey 08/15/2014 with indeterminant lucent skull lesions  Cycle 1 RVD 08/22/2014  Cycle 2 RVD 09/19/2014  Cycle 3 RVD 10/17/2014  Cycle 4 RVD 11/14/2014 (Decadron reduced to 20 mg weekly, Revlimid 15 mg days 1 through 14, Velcade 3/4 weeks)  Cycle 5 RVD 12/12/2014  Serum M spike not detected 12/26/2014  Cycle 6 RVD 01/09/2015  Maintenance Revlimid, 10 mg daily, 02/05/2015 , discontinue 02/26/2015 secondary to leg edema and diarrhea  Revlimid resumed at a dose of 10 mg every other day beginning 03/13/2015  2. 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 History of intermittent prostatitis  4. Skin rash 10/24/2014-referred to dermatology, biopsy consistent with local hypersensitivity reaction  5. 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 Left lower leg cellulitis 06/24/2015-treated with Keflex  7. Mild thrombocytopenia secondary to Revlimid versus myeloma  8.   Mild neutropenia-likely secondary to Revlimid versus myeloma      Disposition:  Mitchell Lowery appears stable. The hemoglobin remains in the normal range, but the IgA and lambda light chains are rising. He will continue Revlimid at the current dose for now. We made a referral to Dr. Amalia Hailey when I saw him last month, but this appointment has not been scheduled. We will contact [facility] again today.  Mitchell Lowery will return for an office and lab visit in one month.  [name], MD  09/05/2015  11:06 AM

## 2015-09-06 ENCOUNTER — Telehealth: Payer: Self-pay | Admitting: Oncology

## 2015-09-06 LAB — IGA: IgA, Qn, Serum: 369 mg/dL (ref 61–437)

## 2015-09-06 LAB — KAPPA/LAMBDA LIGHT CHAINS
IG LAMBDA FREE LIGHT CHAIN: 68.5 mg/L — AB (ref 5.7–26.3)
Ig Kappa Free Light Chain: 14.2 mg/L (ref 3.3–19.4)
Kappa/Lambda FluidC Ratio: 0.21 — ABNORMAL LOW (ref 0.26–1.65)

## 2015-09-06 NOTE — Telephone Encounter (Signed)
Pt appt with Dr. Amalia Hailey is 09/25/15@1 :00.  Pt is aware

## 2015-09-18 ENCOUNTER — Other Ambulatory Visit: Payer: Self-pay | Admitting: *Deleted

## 2015-09-18 MED ORDER — LENALIDOMIDE 10 MG PO CAPS: ORAL_CAPSULE | ORAL | 0 refills | Status: DC

## 2015-09-20 DIAGNOSIS — C9000 Multiple myeloma not having achieved remission: Secondary | ICD-10-CM | POA: Diagnosis not present

## 2015-09-21 DIAGNOSIS — C9002 Multiple myeloma in relapse: Secondary | ICD-10-CM | POA: Insufficient documentation

## 2015-09-25 DIAGNOSIS — Z6824 Body mass index (BMI) 24.0-24.9, adult: Secondary | ICD-10-CM | POA: Diagnosis not present

## 2015-09-25 DIAGNOSIS — K529 Noninfective gastroenteritis and colitis, unspecified: Secondary | ICD-10-CM | POA: Diagnosis not present

## 2015-09-25 DIAGNOSIS — E854 Organ-limited amyloidosis: Secondary | ICD-10-CM | POA: Diagnosis not present

## 2015-09-25 DIAGNOSIS — C9000 Multiple myeloma not having achieved remission: Secondary | ICD-10-CM | POA: Diagnosis not present

## 2015-09-25 DIAGNOSIS — R0609 Other forms of dyspnea: Secondary | ICD-10-CM | POA: Diagnosis not present

## 2015-10-03 ENCOUNTER — Other Ambulatory Visit (HOSPITAL_BASED_OUTPATIENT_CLINIC_OR_DEPARTMENT_OTHER): Payer: Medicare Other

## 2015-10-03 ENCOUNTER — Ambulatory Visit (HOSPITAL_BASED_OUTPATIENT_CLINIC_OR_DEPARTMENT_OTHER): Payer: Medicare Other | Admitting: Oncology

## 2015-10-03 ENCOUNTER — Telehealth: Payer: Self-pay | Admitting: Oncology

## 2015-10-03 VITALS — BP 147/58 | HR 54 | Temp 97.7°F | Resp 18 | Ht 70.0 in | Wt 177.6 lb

## 2015-10-03 DIAGNOSIS — C9000 Multiple myeloma not having achieved remission: Secondary | ICD-10-CM

## 2015-10-03 DIAGNOSIS — D701 Agranulocytosis secondary to cancer chemotherapy: Secondary | ICD-10-CM

## 2015-10-03 DIAGNOSIS — E291 Testicular hypofunction: Secondary | ICD-10-CM

## 2015-10-03 DIAGNOSIS — D6959 Other secondary thrombocytopenia: Secondary | ICD-10-CM

## 2015-10-03 DIAGNOSIS — C61 Malignant neoplasm of prostate: Secondary | ICD-10-CM

## 2015-10-03 LAB — CBC WITH DIFFERENTIAL/PLATELET
BASO%: 1.4 % (ref 0.0–2.0)
Basophils Absolute: 0 10*3/uL (ref 0.0–0.1)
EOS%: 13.3 % — AB (ref 0.0–7.0)
Eosinophils Absolute: 0.4 10*3/uL (ref 0.0–0.5)
HCT: 43.3 % (ref 38.4–49.9)
HGB: 14 g/dL (ref 13.0–17.1)
LYMPH%: 16.6 % (ref 14.0–49.0)
MCH: 27.5 pg (ref 27.2–33.4)
MCHC: 32.4 g/dL (ref 32.0–36.0)
MCV: 84.9 fL (ref 79.3–98.0)
MONO#: 0.4 10*3/uL (ref 0.1–0.9)
MONO%: 12.2 % (ref 0.0–14.0)
NEUT%: 56.5 % (ref 39.0–75.0)
NEUTROS ABS: 1.8 10*3/uL (ref 1.5–6.5)
Platelets: 114 10*3/uL — ABNORMAL LOW (ref 140–400)
RBC: 5.1 10*6/uL (ref 4.20–5.82)
RDW: 15.3 % — ABNORMAL HIGH (ref 11.0–14.6)
WBC: 3.2 10*3/uL — AB (ref 4.0–10.3)
lymph#: 0.5 10*3/uL — ABNORMAL LOW (ref 0.9–3.3)

## 2015-10-03 LAB — BASIC METABOLIC PANEL
ANION GAP: 11 meq/L (ref 3–11)
BUN: 8.8 mg/dL (ref 7.0–26.0)
CHLORIDE: 107 meq/L (ref 98–109)
CO2: 26 mEq/L (ref 22–29)
Calcium: 9.3 mg/dL (ref 8.4–10.4)
Creatinine: 0.8 mg/dL (ref 0.7–1.3)
EGFR: 86 mL/min/{1.73_m2} — AB (ref >90)
Glucose: 63 mg/dl — ABNORMAL LOW (ref 70–140)
POTASSIUM: 3.6 meq/L (ref 3.5–5.1)
SODIUM: 144 meq/L (ref 136–145)

## 2015-10-03 NOTE — Telephone Encounter (Signed)
Gave patient avs report and appointments for October  °

## 2015-10-03 NOTE — Progress Notes (Signed)
  [place] OFFICE PROGRESS NOTE   Diagnosis:multiple myeloma  Objective:Dr. Halleck returns as scheduled. He continues Revlimid. He feels well.diarrhea is improved with Imodium and Lomotil. He saw Dr. Amalia Hailey at [facility]. He recommends continuing the Revlimid for now.  Vital signs in last 24 hours:  Blood pressure (!) 147/58, pulse (!) 54, temperature 97.7 F (36.5 C), temperature source Oral, resp. rate 18, height 5' 10" (1.778 m), weight 177 lb 9.6 oz (80.6 kg), SpO2 98 %.   Resp: lungs clear bilaterally Cardio: regular rate and rhythm GI: no hepatosplenomegaly, nontender Vascular: no leg edema, the left lower leg is slightly larger than the right side   Lab Results:  Lab Results  Component Value Date   WBC 3.2 (L) 10/03/2015   HGB 14.0 10/03/2015   HCT 43.3 10/03/2015   MCV 84.9 10/03/2015   PLT 114 (L) 10/03/2015   NEUTROABS 1.8 10/03/2015   09/05/2015: Lambda light chains 68.5, IgA 369  Medications: I have reviewed the patient's current medications.  Assessment/Plan: 1. Multiple myeloma-confirmed on a bone marrow biopsy 08/07/2014, IgA lambda  Serum M spike and increased serum free lambda light chains  Myeloma FISH panel negative for chromosome 4, 11, 12, 13, 14, and 17 abnormalities, cytogenetics with no metaphases  Bone survey 08/15/2014 with indeterminant lucent skull lesions  Cycle 1 RVD 08/22/2014  Cycle 2 RVD 09/19/2014  Cycle 3 RVD 10/17/2014  Cycle 4 RVD 11/14/2014 (Decadron reduced to 20 mg weekly, Revlimid 15 mg days 1 through 14, Velcade 3/4 weeks)  Cycle 5 RVD 12/12/2014  Serum M spike not detected 12/26/2014  Cycle 6 RVD 01/09/2015  Maintenance Revlimid, 10 mg daily, 02/05/2015 , discontinue 02/26/2015 secondary to leg edema and diarrhea  Revlimid resumed at a dose of 10 mg every other day beginning 03/13/2015  2. 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 History of intermittent prostatitis  4. Skin rash 10/24/2014-referred to dermatology, biopsy consistent with local hypersensitivity reaction  5. 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 Left lower leg cellulitis 06/24/2015-treated with Keflex  7. Mild thrombocytopenia secondary to Revlimid versus myeloma  8. Mild neutropenia-likely secondary to Revlimid versus myeloma     Disposition:  He appears stable. The IgA and light chains of increased over the past few months. No clinical evidence of disease progression. The hemoglobin remains in the normal range. The plan is to continue Revlimid at the current dose. He will return for an office and lab visit in one month. Plan is to switch treatment to daratumumab/velcade/decadron when there is further evidence of disease progression.  [name], MD  10/03/2015  9:08 AM

## 2015-10-04 LAB — KAPPA/LAMBDA LIGHT CHAINS
IG KAPPA FREE LIGHT CHAIN: 12.5 mg/L (ref 3.3–19.4)
Ig Lambda Free Light Chain: 85.7 mg/L — ABNORMAL HIGH (ref 5.7–26.3)
Kappa/Lambda FluidC Ratio: 0.15 — ABNORMAL LOW (ref 0.26–1.65)

## 2015-10-04 LAB — IGA: IGA/IMMUNOGLOBULIN A, SERUM: 429 mg/dL (ref 61–437)

## 2015-10-05 ENCOUNTER — Other Ambulatory Visit: Payer: Self-pay | Admitting: Oncology

## 2015-10-12 ENCOUNTER — Other Ambulatory Visit: Payer: Self-pay | Admitting: *Deleted

## 2015-10-12 MED ORDER — LENALIDOMIDE 10 MG PO CAPS: ORAL_CAPSULE | ORAL | 0 refills | Status: DC

## 2015-10-17 DIAGNOSIS — C61 Malignant neoplasm of prostate: Secondary | ICD-10-CM | POA: Diagnosis not present

## 2015-10-24 DIAGNOSIS — N400 Enlarged prostate without lower urinary tract symptoms: Secondary | ICD-10-CM | POA: Diagnosis not present

## 2015-10-24 DIAGNOSIS — C61 Malignant neoplasm of prostate: Secondary | ICD-10-CM | POA: Diagnosis not present

## 2015-10-24 DIAGNOSIS — N320 Bladder-neck obstruction: Secondary | ICD-10-CM | POA: Diagnosis not present

## 2015-10-25 ENCOUNTER — Encounter (HOSPITAL_COMMUNITY): Payer: Self-pay

## 2015-10-25 ENCOUNTER — Emergency Department (HOSPITAL_COMMUNITY): Payer: Medicare Other

## 2015-10-25 ENCOUNTER — Other Ambulatory Visit: Payer: Self-pay | Admitting: *Deleted

## 2015-10-25 ENCOUNTER — Observation Stay (HOSPITAL_COMMUNITY)
Admission: EM | Admit: 2015-10-25 | Discharge: 2015-10-26 | Disposition: A | Discharge status: Home / Self Care | Payer: Medicare Other | Attending: Internal Medicine | Admitting: Internal Medicine

## 2015-10-25 DIAGNOSIS — S066XAA Traumatic subarachnoid hemorrhage with loss of consciousness status unknown, initial encounter: Secondary | ICD-10-CM

## 2015-10-25 DIAGNOSIS — S50311A Abrasion of right elbow, initial encounter: Secondary | ICD-10-CM | POA: Diagnosis not present

## 2015-10-25 DIAGNOSIS — R0789 Other chest pain: Secondary | ICD-10-CM | POA: Insufficient documentation

## 2015-10-25 DIAGNOSIS — M79601 Pain in right arm: Secondary | ICD-10-CM | POA: Insufficient documentation

## 2015-10-25 DIAGNOSIS — S064X9A Epidural hemorrhage with loss of consciousness of unspecified duration, initial encounter: Secondary | ICD-10-CM | POA: Diagnosis present

## 2015-10-25 DIAGNOSIS — S0003XA Contusion of scalp, initial encounter: Secondary | ICD-10-CM | POA: Insufficient documentation

## 2015-10-25 DIAGNOSIS — I609 Nontraumatic subarachnoid hemorrhage, unspecified: Secondary | ICD-10-CM

## 2015-10-25 DIAGNOSIS — R404 Transient alteration of awareness: Secondary | ICD-10-CM

## 2015-10-25 DIAGNOSIS — R55 Syncope and collapse: Secondary | ICD-10-CM | POA: Diagnosis not present

## 2015-10-25 DIAGNOSIS — C9000 Multiple myeloma not having achieved remission: Secondary | ICD-10-CM | POA: Diagnosis not present

## 2015-10-25 DIAGNOSIS — S80211A Abrasion, right knee, initial encounter: Secondary | ICD-10-CM | POA: Insufficient documentation

## 2015-10-25 DIAGNOSIS — S066X9A Traumatic subarachnoid hemorrhage with loss of consciousness of unspecified duration, initial encounter: Principal | ICD-10-CM | POA: Insufficient documentation

## 2015-10-25 DIAGNOSIS — S0990XA Unspecified injury of head, initial encounter: Secondary | ICD-10-CM | POA: Diagnosis not present

## 2015-10-25 DIAGNOSIS — T148 Other injury of unspecified body region: Secondary | ICD-10-CM | POA: Diagnosis not present

## 2015-10-25 DIAGNOSIS — R531 Weakness: Secondary | ICD-10-CM | POA: Insufficient documentation

## 2015-10-25 DIAGNOSIS — E876 Hypokalemia: Secondary | ICD-10-CM | POA: Diagnosis not present

## 2015-10-25 DIAGNOSIS — S06369A Traumatic hemorrhage of cerebrum, unspecified, with loss of consciousness of unspecified duration, initial encounter: Secondary | ICD-10-CM | POA: Diagnosis not present

## 2015-10-25 DIAGNOSIS — D6959 Other secondary thrombocytopenia: Secondary | ICD-10-CM | POA: Insufficient documentation

## 2015-10-25 DIAGNOSIS — S40211A Abrasion of right shoulder, initial encounter: Secondary | ICD-10-CM | POA: Insufficient documentation

## 2015-10-25 DIAGNOSIS — S0291XA Unspecified fracture of skull, initial encounter for closed fracture: Secondary | ICD-10-CM

## 2015-10-25 DIAGNOSIS — Z7982 Long term (current) use of aspirin: Secondary | ICD-10-CM | POA: Insufficient documentation

## 2015-10-25 DIAGNOSIS — Z923 Personal history of irradiation: Secondary | ICD-10-CM | POA: Diagnosis not present

## 2015-10-25 DIAGNOSIS — R51 Headache: Secondary | ICD-10-CM | POA: Diagnosis not present

## 2015-10-25 DIAGNOSIS — S020XXA Fracture of vault of skull, initial encounter for closed fracture: Secondary | ICD-10-CM | POA: Diagnosis not present

## 2015-10-25 DIAGNOSIS — Z8546 Personal history of malignant neoplasm of prostate: Secondary | ICD-10-CM | POA: Insufficient documentation

## 2015-10-25 DIAGNOSIS — S299XXA Unspecified injury of thorax, initial encounter: Secondary | ICD-10-CM | POA: Diagnosis not present

## 2015-10-25 DIAGNOSIS — S199XXA Unspecified injury of neck, initial encounter: Secondary | ICD-10-CM | POA: Diagnosis not present

## 2015-10-25 DIAGNOSIS — S59901A Unspecified injury of right elbow, initial encounter: Secondary | ICD-10-CM | POA: Diagnosis not present

## 2015-10-25 DIAGNOSIS — S066X1A Traumatic subarachnoid hemorrhage with loss of consciousness of 30 minutes or less, initial encounter: Secondary | ICD-10-CM | POA: Diagnosis not present

## 2015-10-25 DIAGNOSIS — S064X1A Epidural hemorrhage with loss of consciousness of 30 minutes or less, initial encounter: Secondary | ICD-10-CM | POA: Diagnosis not present

## 2015-10-25 DIAGNOSIS — Y9355 Activity, bike riding: Secondary | ICD-10-CM | POA: Diagnosis not present

## 2015-10-25 DIAGNOSIS — S0219XA Other fracture of base of skull, initial encounter for closed fracture: Secondary | ICD-10-CM

## 2015-10-25 DIAGNOSIS — R4020 Unspecified coma: Secondary | ICD-10-CM | POA: Diagnosis present

## 2015-10-25 HISTORY — DX: Nontraumatic subarachnoid hemorrhage, unspecified: I60.9

## 2015-10-25 LAB — BASIC METABOLIC PANEL
Anion gap: 12 (ref 5–15)
BUN: 6 mg/dL (ref 6–20)
CHLORIDE: 110 mmol/L (ref 101–111)
CO2: 20 mmol/L — ABNORMAL LOW (ref 22–32)
CREATININE: 0.77 mg/dL (ref 0.61–1.24)
Calcium: 9.3 mg/dL (ref 8.9–10.3)
GFR calc Af Amer: >60 mL/min (ref >60)
GFR calc non Af Amer: >60 mL/min (ref >60)
GLUCOSE: 99 mg/dL (ref 65–99)
Potassium: 3.6 mmol/L (ref 3.5–5.1)
SODIUM: 142 mmol/L (ref 135–145)

## 2015-10-25 LAB — CBC WITH DIFFERENTIAL/PLATELET
Basophils Absolute: 0 10*3/uL (ref 0.0–0.1)
Basophils Relative: 1 %
EOS ABS: 0.1 10*3/uL (ref 0.0–0.7)
EOS PCT: 3 %
HCT: 44.2 % (ref 39.0–52.0)
Hemoglobin: 13.9 g/dL (ref 13.0–17.0)
LYMPHS ABS: 0.5 10*3/uL — AB (ref 0.7–4.0)
LYMPHS PCT: 9 %
MCH: 27.5 pg (ref 26.0–34.0)
MCHC: 31.4 g/dL (ref 30.0–36.0)
MCV: 87.5 fL (ref 78.0–100.0)
MONO ABS: 0.5 10*3/uL (ref 0.1–1.0)
Monocytes Relative: 11 %
Neutro Abs: 3.9 10*3/uL (ref 1.7–7.7)
Neutrophils Relative %: 77 %
PLATELETS: 98 10*3/uL — AB (ref 150–400)
RBC: 5.05 MIL/uL (ref 4.22–5.81)
RDW: 13.8 % (ref 11.5–15.5)
WBC: 5.1 10*3/uL (ref 4.0–10.5)

## 2015-10-25 LAB — PHOSPHORUS: Phosphorus: 2.7 mg/dL (ref 2.5–4.6)

## 2015-10-25 LAB — I-STAT TROPONIN, ED: TROPONIN I, POC: 0.01 ng/mL (ref 0.00–0.08)

## 2015-10-25 LAB — MAGNESIUM: MAGNESIUM: 1.8 mg/dL (ref 1.7–2.4)

## 2015-10-25 MED ORDER — ACETAMINOPHEN 650 MG RE SUPP: 650.0000 mg | Freq: Four times a day (QID) | RECTAL | Status: DC | PRN

## 2015-10-25 MED ORDER — ONDANSETRON HCL 4 MG/2ML IJ SOLN: 4.0000 mg | Freq: Four times a day (QID) | INTRAMUSCULAR | Status: DC | PRN

## 2015-10-25 MED ORDER — ACETAMINOPHEN 325 MG PO TABS
650.0000 mg | ORAL_TABLET | Freq: Four times a day (QID) | ORAL | Status: DC | PRN
  Administered 2015-10-25: 650 mg via ORAL
  Filled 2015-10-25: qty 2

## 2015-10-25 MED ORDER — SENNOSIDES-DOCUSATE SODIUM 8.6-50 MG PO TABS: 1.0000 | ORAL_TABLET | Freq: Every evening | ORAL | Status: DC | PRN

## 2015-10-25 MED ORDER — TRAMADOL HCL 50 MG PO TABS
50.0000 mg | ORAL_TABLET | Freq: Four times a day (QID) | ORAL | Status: DC | PRN
  Administered 2015-10-26 (×2): 50 mg via ORAL
  Filled 2015-10-25 (×2): qty 1

## 2015-10-25 MED ORDER — HYDRALAZINE HCL 20 MG/ML IJ SOLN: 10.0000 mg | Freq: Three times a day (TID) | INTRAMUSCULAR | Status: DC | PRN

## 2015-10-25 MED ORDER — SODIUM CHLORIDE 0.9% FLUSH
3.0000 mL | Freq: Two times a day (BID) | INTRAVENOUS | Status: DC
  Administered 2015-10-25 – 2015-10-26 (×2): 3 mL via INTRAVENOUS

## 2015-10-25 MED ORDER — LENALIDOMIDE 10 MG PO CAPS
10.0000 mg | ORAL_CAPSULE | ORAL | Status: DC
  Administered 2015-10-25: 10 mg via ORAL

## 2015-10-25 MED ORDER — ONDANSETRON HCL 4 MG PO TABS: 4.0000 mg | ORAL_TABLET | Freq: Four times a day (QID) | ORAL | Status: DC | PRN

## 2015-10-25 MED ORDER — DIPHENOXYLATE-ATROPINE 2.5-0.025 MG PO TABS
1.0000 | ORAL_TABLET | Freq: Four times a day (QID) | ORAL | Status: DC | PRN
  Administered 2015-10-25: 1 via ORAL
  Filled 2015-10-25: qty 1

## 2015-10-25 NOTE — ED Notes (Signed)
Patient transported to X-ray 
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
## 2015-10-25 NOTE — ED Notes (Signed)
Pt being transported upstairs by Apolonio Schneiders, EMT
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
## 2015-10-25 NOTE — Progress Notes (Signed)
Administered Tylenol for chest soreness pt rates 6/10 per pt request. Pt stated that he doesn't take pain medication at home and wanted to see if Tylenol would be effective.  Pt daughter and son who is a MD requested that this nurse encourage pt to take something else other than Tylenol. Pt alert and oriented x4 still requested Tylenol. This nurse informed pt that oncall MD will be notified to request another medication along with Tylenol just incase the Tylenol was not effective. Pt agreed that would be ok. MD text paged. Reported to oncoming nurse.
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
## 2015-10-26 ENCOUNTER — Observation Stay (HOSPITAL_BASED_OUTPATIENT_CLINIC_OR_DEPARTMENT_OTHER): Payer: Medicare Other

## 2015-10-26 ENCOUNTER — Observation Stay (HOSPITAL_COMMUNITY): Payer: Medicare Other

## 2015-10-26 DIAGNOSIS — S0990XA Unspecified injury of head, initial encounter: Secondary | ICD-10-CM | POA: Diagnosis not present

## 2015-10-26 DIAGNOSIS — C9000 Multiple myeloma not having achieved remission: Secondary | ICD-10-CM | POA: Diagnosis not present

## 2015-10-26 DIAGNOSIS — R55 Syncope and collapse: Secondary | ICD-10-CM

## 2015-10-26 DIAGNOSIS — S0219XA Other fracture of base of skull, initial encounter for closed fracture: Secondary | ICD-10-CM | POA: Diagnosis not present

## 2015-10-26 DIAGNOSIS — S066X9A Traumatic subarachnoid hemorrhage with loss of consciousness of unspecified duration, initial encounter: Secondary | ICD-10-CM | POA: Diagnosis not present

## 2015-10-26 DIAGNOSIS — S066X1A Traumatic subarachnoid hemorrhage with loss of consciousness of 30 minutes or less, initial encounter: Secondary | ICD-10-CM | POA: Diagnosis not present

## 2015-10-26 DIAGNOSIS — R404 Transient alteration of awareness: Secondary | ICD-10-CM | POA: Diagnosis not present

## 2015-10-26 LAB — COMPREHENSIVE METABOLIC PANEL
ALT: 12 U/L — AB (ref 17–63)
AST: 21 U/L (ref 15–41)
Albumin: 3.3 g/dL — ABNORMAL LOW (ref 3.5–5.0)
Alkaline Phosphatase: 39 U/L (ref 38–126)
Anion gap: 6 (ref 5–15)
BUN: 7 mg/dL (ref 6–20)
CHLORIDE: 106 mmol/L (ref 101–111)
CO2: 27 mmol/L (ref 22–32)
CREATININE: 0.67 mg/dL (ref 0.61–1.24)
Calcium: 8.7 mg/dL — ABNORMAL LOW (ref 8.9–10.3)
Glucose, Bld: 104 mg/dL — ABNORMAL HIGH (ref 65–99)
POTASSIUM: 3.1 mmol/L — AB (ref 3.5–5.1)
SODIUM: 139 mmol/L (ref 135–145)
Total Bilirubin: 1 mg/dL (ref 0.3–1.2)
Total Protein: 5.9 g/dL — ABNORMAL LOW (ref 6.5–8.1)

## 2015-10-26 LAB — CBC
HEMATOCRIT: 41.4 % (ref 39.0–52.0)
Hemoglobin: 13.1 g/dL (ref 13.0–17.0)
MCH: 27.5 pg (ref 26.0–34.0)
MCHC: 31.6 g/dL (ref 30.0–36.0)
MCV: 87 fL (ref 78.0–100.0)
PLATELETS: 100 10*3/uL — AB (ref 150–400)
RBC: 4.76 MIL/uL (ref 4.22–5.81)
RDW: 14 % (ref 11.5–15.5)
WBC: 4.2 10*3/uL (ref 4.0–10.5)

## 2015-10-26 LAB — ECHOCARDIOGRAM COMPLETE
HEIGHTINCHES: 71 in
Weight: 2686.4 oz

## 2015-10-26 LAB — GLUCOSE, CAPILLARY: Glucose-Capillary: 102 mg/dL — ABNORMAL HIGH (ref 65–99)

## 2015-10-26 MED ORDER — TRAMADOL HCL 50 MG PO TABS: 50.0000 mg | ORAL_TABLET | Freq: Four times a day (QID) | ORAL | 0 refills | Status: DC | PRN

## 2015-10-26 MED ORDER — POTASSIUM CHLORIDE CRYS ER 20 MEQ PO TBCR
40.0000 meq | EXTENDED_RELEASE_TABLET | Freq: Once | ORAL | Status: AC
  Administered 2015-10-26: 40 meq via ORAL
  Filled 2015-10-26: qty 2
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
## 2015-10-26 NOTE — Care Management Obs Status (Signed)
[place] NOTIFICATION   Patient Details  Name: Mitchell Lowery MRN: [id number] Date of Birth: 25-Apr-1934   Medicare Observation Status Notification Given:  Yes    Pollie Friar, RN 10/26/2015, 12:01 PM

## 2015-10-26 NOTE — Progress Notes (Signed)
Pt discharge education and instructions completed with pt and spouse at bedside; both voices understanding and denies any questions. Pt IV and telemetry removed; pt discharge home with spouse to transport him home. Pt handed his prescription for tramadol. Pt transported off unit via wheelchair with belongings and spouse to the side. Delia Heady RN

## 2015-10-26 NOTE — Progress Notes (Signed)
Echocardiogram 2D Echocardiogram has been performed.  Mitchell Lowery 10/26/2015, 11:13 AM
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
## 2015-10-31 ENCOUNTER — Other Ambulatory Visit (HOSPITAL_BASED_OUTPATIENT_CLINIC_OR_DEPARTMENT_OTHER): Payer: Medicare Other

## 2015-10-31 ENCOUNTER — Telehealth: Payer: Self-pay | Admitting: Oncology

## 2015-10-31 ENCOUNTER — Ambulatory Visit (HOSPITAL_BASED_OUTPATIENT_CLINIC_OR_DEPARTMENT_OTHER): Payer: Medicare Other | Admitting: Oncology

## 2015-10-31 VITALS — BP 136/56 | HR 51 | Temp 98.2°F | Resp 17 | Ht 71.0 in | Wt 172.4 lb

## 2015-10-31 DIAGNOSIS — C9000 Multiple myeloma not having achieved remission: Secondary | ICD-10-CM

## 2015-10-31 DIAGNOSIS — Z23 Encounter for immunization: Secondary | ICD-10-CM

## 2015-10-31 DIAGNOSIS — M67911 Unspecified disorder of synovium and tendon, right shoulder: Secondary | ICD-10-CM | POA: Diagnosis not present

## 2015-10-31 LAB — COMPREHENSIVE METABOLIC PANEL
ALBUMIN: 3.3 g/dL — AB (ref 3.5–5.0)
ALK PHOS: 53 U/L (ref 40–150)
ALT: 11 U/L (ref 0–55)
AST: 14 U/L (ref 5–34)
Anion Gap: 9 mEq/L (ref 3–11)
BILIRUBIN TOTAL: 0.85 mg/dL (ref 0.20–1.20)
BUN: 9.9 mg/dL (ref 7.0–26.0)
CALCIUM: 9.4 mg/dL (ref 8.4–10.4)
CO2: 29 mEq/L (ref 22–29)
Chloride: 103 mEq/L (ref 98–109)
Creatinine: 0.8 mg/dL (ref 0.7–1.3)
EGFR: 85 mL/min/{1.73_m2} — ABNORMAL LOW (ref >90)
GLUCOSE: 113 mg/dL (ref 70–140)
Potassium: 4 mEq/L (ref 3.5–5.1)
SODIUM: 141 meq/L (ref 136–145)
TOTAL PROTEIN: 6.6 g/dL (ref 6.4–8.3)

## 2015-10-31 LAB — CBC WITH DIFFERENTIAL/PLATELET
BASO%: 2.6 % — ABNORMAL HIGH (ref 0.0–2.0)
BASOS ABS: 0.1 10*3/uL (ref 0.0–0.1)
EOS ABS: 0.4 10*3/uL (ref 0.0–0.5)
EOS%: 11.4 % — AB (ref 0.0–7.0)
HEMATOCRIT: 41.8 % (ref 38.4–49.9)
HEMOGLOBIN: 13.8 g/dL (ref 13.0–17.1)
LYMPH%: 18.9 % (ref 14.0–49.0)
MCH: 28 pg (ref 27.2–33.4)
MCHC: 33 g/dL (ref 32.0–36.0)
MCV: 85 fL (ref 79.3–98.0)
MONO#: 0.4 10*3/uL (ref 0.1–0.9)
MONO%: 12.7 % (ref 0.0–14.0)
NEUT%: 54.4 % (ref 39.0–75.0)
NEUTROS ABS: 1.7 10*3/uL (ref 1.5–6.5)
Platelets: 104 10*3/uL — ABNORMAL LOW (ref 140–400)
RBC: 4.92 10*6/uL (ref 4.20–5.82)
RDW: 13.6 % (ref 11.0–14.6)
WBC: 3.1 10*3/uL — ABNORMAL LOW (ref 4.0–10.3)
lymph#: 0.6 10*3/uL — ABNORMAL LOW (ref 0.9–3.3)

## 2015-10-31 MED ORDER — INFLUENZA VAC SPLIT QUAD 0.5 ML IM SUSY
0.5000 mL | PREFILLED_SYRINGE | Freq: Once | INTRAMUSCULAR | Status: AC
  Administered 2015-10-31: 0.5 mL via INTRAMUSCULAR
  Filled 2015-10-31: qty 0.5
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
## 2015-10-31 NOTE — Telephone Encounter (Signed)
Avs report and  Appointment schedule, given to patient, per 10/31/15 los. °

## 2015-11-01 ENCOUNTER — Other Ambulatory Visit: Payer: Self-pay | Admitting: *Deleted

## 2015-11-01 DIAGNOSIS — M25511 Pain in right shoulder: Secondary | ICD-10-CM | POA: Diagnosis not present

## 2015-11-01 LAB — IGA: IgA, Qn, Serum: 501 mg/dL — ABNORMAL HIGH (ref 61–437)

## 2015-11-01 LAB — KAPPA/LAMBDA LIGHT CHAINS
Ig Kappa Free Light Chain: 11.6 mg/L (ref 3.3–19.4)
Ig Lambda Free Light Chain: 61.2 mg/L — ABNORMAL HIGH (ref 5.7–26.3)
Kappa/Lambda FluidC Ratio: 0.19 — ABNORMAL LOW (ref 0.26–1.65)

## 2015-11-05 DIAGNOSIS — M25511 Pain in right shoulder: Secondary | ICD-10-CM | POA: Diagnosis not present

## 2015-11-09 ENCOUNTER — Other Ambulatory Visit: Payer: Self-pay | Admitting: *Deleted

## 2015-11-09 MED ORDER — LENALIDOMIDE 10 MG PO CAPS: ORAL_CAPSULE | ORAL | 0 refills | Status: DC

## 2015-11-21 DIAGNOSIS — M67911 Unspecified disorder of synovium and tendon, right shoulder: Secondary | ICD-10-CM | POA: Diagnosis not present

## 2015-11-22 DIAGNOSIS — M25511 Pain in right shoulder: Secondary | ICD-10-CM | POA: Diagnosis not present

## 2015-11-26 DIAGNOSIS — M25511 Pain in right shoulder: Secondary | ICD-10-CM | POA: Diagnosis not present

## 2015-11-28 ENCOUNTER — Ambulatory Visit (HOSPITAL_BASED_OUTPATIENT_CLINIC_OR_DEPARTMENT_OTHER): Payer: Medicare Other | Admitting: Oncology

## 2015-11-28 ENCOUNTER — Ambulatory Visit (HOSPITAL_BASED_OUTPATIENT_CLINIC_OR_DEPARTMENT_OTHER): Payer: Medicare Other

## 2015-11-28 ENCOUNTER — Telehealth: Payer: Self-pay | Admitting: Oncology

## 2015-11-28 VITALS — BP 147/62 | HR 66 | Temp 98.4°F | Resp 18 | Ht 71.0 in | Wt 175.4 lb

## 2015-11-28 DIAGNOSIS — C9000 Multiple myeloma not having achieved remission: Secondary | ICD-10-CM

## 2015-11-28 DIAGNOSIS — D709 Neutropenia, unspecified: Secondary | ICD-10-CM

## 2015-11-28 DIAGNOSIS — C61 Malignant neoplasm of prostate: Secondary | ICD-10-CM

## 2015-11-28 DIAGNOSIS — Z23 Encounter for immunization: Secondary | ICD-10-CM

## 2015-11-28 DIAGNOSIS — E291 Testicular hypofunction: Secondary | ICD-10-CM

## 2015-11-28 DIAGNOSIS — D696 Thrombocytopenia, unspecified: Secondary | ICD-10-CM

## 2015-11-28 LAB — CBC WITH DIFFERENTIAL/PLATELET
BASO%: 1.9 % (ref 0.0–2.0)
Basophils Absolute: 0.1 10*3/uL (ref 0.0–0.1)
EOS%: 9.6 % — ABNORMAL HIGH (ref 0.0–7.0)
Eosinophils Absolute: 0.3 10*3/uL (ref 0.0–0.5)
HEMATOCRIT: 41.9 % (ref 38.4–49.9)
HGB: 13.4 g/dL (ref 13.0–17.1)
LYMPH#: 0.6 10*3/uL — AB (ref 0.9–3.3)
LYMPH%: 19.6 % (ref 14.0–49.0)
MCH: 27 pg — ABNORMAL LOW (ref 27.2–33.4)
MCHC: 32.1 g/dL (ref 32.0–36.0)
MCV: 84.2 fL (ref 79.3–98.0)
MONO#: 0.3 10*3/uL (ref 0.1–0.9)
MONO%: 11.4 % (ref 0.0–14.0)
NEUT%: 57.5 % (ref 39.0–75.0)
NEUTROS ABS: 1.6 10*3/uL (ref 1.5–6.5)
PLATELETS: 121 10*3/uL — AB (ref 140–400)
RBC: 4.98 10*6/uL (ref 4.20–5.82)
RDW: 15.1 % — ABNORMAL HIGH (ref 11.0–14.6)
WBC: 2.9 10*3/uL — AB (ref 4.0–10.3)

## 2015-11-28 LAB — COMPREHENSIVE METABOLIC PANEL
ALBUMIN: 3.2 g/dL — AB (ref 3.5–5.0)
ALT: 10 U/L (ref 0–55)
ANION GAP: 8 meq/L (ref 3–11)
AST: 14 U/L (ref 5–34)
Alkaline Phosphatase: 61 U/L (ref 40–150)
BILIRUBIN TOTAL: 0.66 mg/dL (ref 0.20–1.20)
BUN: 7.3 mg/dL (ref 7.0–26.0)
CALCIUM: 9.2 mg/dL (ref 8.4–10.4)
CO2: 30 mEq/L — ABNORMAL HIGH (ref 22–29)
CREATININE: 0.7 mg/dL (ref 0.7–1.3)
Chloride: 108 mEq/L (ref 98–109)
EGFR: 87 mL/min/{1.73_m2} — ABNORMAL LOW (ref >90)
Glucose: 79 mg/dl (ref 70–140)
Potassium: 3.6 mEq/L (ref 3.5–5.1)
Sodium: 146 mEq/L — ABNORMAL HIGH (ref 136–145)
TOTAL PROTEIN: 6.6 g/dL (ref 6.4–8.3)

## 2015-11-28 NOTE — Telephone Encounter (Signed)
Appointments scheduled per 11/1 LOS. Patient given AVS report and calendars with future scheduled appointments.  °
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
## 2015-11-29 ENCOUNTER — Other Ambulatory Visit: Payer: Self-pay | Admitting: Oncology

## 2015-11-29 DIAGNOSIS — C9000 Multiple myeloma not having achieved remission: Secondary | ICD-10-CM

## 2015-11-29 LAB — KAPPA/LAMBDA LIGHT CHAINS
Ig Kappa Free Light Chain: 11.5 mg/L (ref 3.3–19.4)
Ig Lambda Free Light Chain: 109.8 mg/L — ABNORMAL HIGH (ref 5.7–26.3)
KAPPA/LAMBDA FLC RATIO: 0.1 — AB (ref 0.26–1.65)

## 2015-11-30 ENCOUNTER — Other Ambulatory Visit: Payer: Self-pay | Admitting: *Deleted

## 2015-11-30 DIAGNOSIS — M25511 Pain in right shoulder: Secondary | ICD-10-CM | POA: Diagnosis not present

## 2015-11-30 DIAGNOSIS — C9000 Multiple myeloma not having achieved remission: Secondary | ICD-10-CM

## 2015-12-01 LAB — IGA: IGA/IMMUNOGLOBULIN A, SERUM: 691 mg/dL — AB (ref 61–437)

## 2015-12-03 DIAGNOSIS — M25511 Pain in right shoulder: Secondary | ICD-10-CM | POA: Diagnosis not present

## 2015-12-06 DIAGNOSIS — M25511 Pain in right shoulder: Secondary | ICD-10-CM | POA: Diagnosis not present

## 2015-12-07 ENCOUNTER — Telehealth: Payer: Self-pay | Admitting: *Deleted

## 2015-12-07 NOTE — Telephone Encounter (Signed)
Call from pt requesting refill on Revlimid. Informed him refill was e-prescribed to [facility] on 11/29/15.

## 2015-12-10 DIAGNOSIS — M25511 Pain in right shoulder: Secondary | ICD-10-CM | POA: Diagnosis not present

## 2015-12-13 DIAGNOSIS — M25511 Pain in right shoulder: Secondary | ICD-10-CM | POA: Diagnosis not present

## 2015-12-18 DIAGNOSIS — M25511 Pain in right shoulder: Secondary | ICD-10-CM | POA: Diagnosis not present

## 2015-12-28 ENCOUNTER — Other Ambulatory Visit: Payer: Self-pay | Admitting: Oncology

## 2015-12-28 DIAGNOSIS — C9000 Multiple myeloma not having achieved remission: Secondary | ICD-10-CM

## 2016-01-01 ENCOUNTER — Other Ambulatory Visit: Payer: Self-pay | Admitting: *Deleted

## 2016-01-01 DIAGNOSIS — I609 Nontraumatic subarachnoid hemorrhage, unspecified: Secondary | ICD-10-CM | POA: Diagnosis not present

## 2016-01-01 DIAGNOSIS — Z6824 Body mass index (BMI) 24.0-24.9, adult: Secondary | ICD-10-CM | POA: Diagnosis not present

## 2016-01-01 DIAGNOSIS — R197 Diarrhea, unspecified: Secondary | ICD-10-CM | POA: Diagnosis not present

## 2016-01-01 DIAGNOSIS — Z79899 Other long term (current) drug therapy: Secondary | ICD-10-CM | POA: Diagnosis not present

## 2016-01-01 DIAGNOSIS — M7989 Other specified soft tissue disorders: Secondary | ICD-10-CM | POA: Diagnosis not present

## 2016-01-01 DIAGNOSIS — Z7982 Long term (current) use of aspirin: Secondary | ICD-10-CM | POA: Diagnosis not present

## 2016-01-01 DIAGNOSIS — C9000 Multiple myeloma not having achieved remission: Secondary | ICD-10-CM

## 2016-01-01 DIAGNOSIS — S0281XS Fracture of other specified skull and facial bones, right side, sequela: Secondary | ICD-10-CM | POA: Diagnosis not present

## 2016-01-01 DIAGNOSIS — C9002 Multiple myeloma in relapse: Secondary | ICD-10-CM | POA: Diagnosis not present

## 2016-01-01 MED ORDER — LENALIDOMIDE 10 MG PO CAPS: ORAL_CAPSULE | ORAL | 0 refills | Status: DC

## 2016-01-01 MED ORDER — LENALIDOMIDE 10 MG PO CAPS: 10.0000 mg | ORAL_CAPSULE | ORAL | 0 refills | Status: DC

## 2016-01-02 DIAGNOSIS — L97909 Non-pressure chronic ulcer of unspecified part of unspecified lower leg with unspecified severity: Secondary | ICD-10-CM | POA: Diagnosis not present

## 2016-01-02 DIAGNOSIS — M25511 Pain in right shoulder: Secondary | ICD-10-CM | POA: Diagnosis not present

## 2016-01-09 ENCOUNTER — Ambulatory Visit (HOSPITAL_BASED_OUTPATIENT_CLINIC_OR_DEPARTMENT_OTHER): Payer: Medicare Other | Admitting: Oncology

## 2016-01-09 ENCOUNTER — Telehealth: Payer: Self-pay | Admitting: Oncology

## 2016-01-09 VITALS — BP 154/69 | HR 67 | Temp 97.7°F | Resp 17 | Ht 71.0 in | Wt 176.6 lb

## 2016-01-09 DIAGNOSIS — Z8546 Personal history of malignant neoplasm of prostate: Secondary | ICD-10-CM

## 2016-01-09 DIAGNOSIS — C9000 Multiple myeloma not having achieved remission: Secondary | ICD-10-CM | POA: Diagnosis not present

## 2016-01-09 DIAGNOSIS — D701 Agranulocytosis secondary to cancer chemotherapy: Secondary | ICD-10-CM

## 2016-01-09 DIAGNOSIS — D6959 Other secondary thrombocytopenia: Secondary | ICD-10-CM | POA: Diagnosis not present
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
## 2016-01-09 NOTE — Telephone Encounter (Signed)
GAVE PATIENT AVS REPORT AND APPOINTMENTS FOR January. [facility] WILL CALL RE SCAN.

## 2016-01-10 DIAGNOSIS — I809 Phlebitis and thrombophlebitis of unspecified site: Secondary | ICD-10-CM | POA: Diagnosis not present

## 2016-01-10 DIAGNOSIS — R011 Cardiac murmur, unspecified: Secondary | ICD-10-CM | POA: Diagnosis not present

## 2016-01-10 DIAGNOSIS — E559 Vitamin D deficiency, unspecified: Secondary | ICD-10-CM | POA: Diagnosis not present

## 2016-01-10 DIAGNOSIS — R030 Elevated blood-pressure reading, without diagnosis of hypertension: Secondary | ICD-10-CM | POA: Diagnosis not present

## 2016-01-10 DIAGNOSIS — D696 Thrombocytopenia, unspecified: Secondary | ICD-10-CM | POA: Diagnosis not present

## 2016-01-10 DIAGNOSIS — I499 Cardiac arrhythmia, unspecified: Secondary | ICD-10-CM | POA: Diagnosis not present

## 2016-01-10 DIAGNOSIS — C61 Malignant neoplasm of prostate: Secondary | ICD-10-CM | POA: Diagnosis not present

## 2016-01-10 DIAGNOSIS — C9000 Multiple myeloma not having achieved remission: Secondary | ICD-10-CM | POA: Diagnosis not present

## 2016-01-10 DIAGNOSIS — I700 Atherosclerosis of aorta: Secondary | ICD-10-CM | POA: Diagnosis not present

## 2016-01-10 DIAGNOSIS — D649 Anemia, unspecified: Secondary | ICD-10-CM | POA: Diagnosis not present

## 2016-01-10 DIAGNOSIS — I779 Disorder of arteries and arterioles, unspecified: Secondary | ICD-10-CM | POA: Diagnosis not present

## 2016-01-10 DIAGNOSIS — N400 Enlarged prostate without lower urinary tract symptoms: Secondary | ICD-10-CM | POA: Diagnosis not present

## 2016-01-23 ENCOUNTER — Other Ambulatory Visit: Payer: Self-pay | Admitting: Nurse Practitioner

## 2016-01-23 ENCOUNTER — Other Ambulatory Visit: Payer: Self-pay | Admitting: Oncology

## 2016-01-23 DIAGNOSIS — C9000 Multiple myeloma not having achieved remission: Secondary | ICD-10-CM

## 2016-01-31 ENCOUNTER — Other Ambulatory Visit (HOSPITAL_BASED_OUTPATIENT_CLINIC_OR_DEPARTMENT_OTHER): Payer: Medicare Other

## 2016-01-31 ENCOUNTER — Telehealth: Payer: Self-pay | Admitting: *Deleted

## 2016-01-31 DIAGNOSIS — C61 Malignant neoplasm of prostate: Secondary | ICD-10-CM

## 2016-01-31 DIAGNOSIS — C9000 Multiple myeloma not having achieved remission: Secondary | ICD-10-CM

## 2016-01-31 LAB — CBC WITH DIFFERENTIAL/PLATELET
BASO%: 0.7 % (ref 0.0–2.0)
Basophils Absolute: 0 10*3/uL (ref 0.0–0.1)
EOS%: 16.6 % — AB (ref 0.0–7.0)
Eosinophils Absolute: 0.5 10*3/uL (ref 0.0–0.5)
HEMATOCRIT: 43 % (ref 38.4–49.9)
HGB: 14 g/dL (ref 13.0–17.1)
LYMPH#: 0.7 10*3/uL — AB (ref 0.9–3.3)
LYMPH%: 23.3 % (ref 14.0–49.0)
MCH: 28.1 pg (ref 27.2–33.4)
MCHC: 32.6 g/dL (ref 32.0–36.0)
MCV: 86.1 fL (ref 79.3–98.0)
MONO#: 0.4 10*3/uL (ref 0.1–0.9)
MONO%: 12.1 % (ref 0.0–14.0)
NEUT#: 1.5 10*3/uL (ref 1.5–6.5)
NEUT%: 47.3 % (ref 39.0–75.0)
Platelets: 115 10*3/uL — ABNORMAL LOW (ref 140–400)
RBC: 4.99 10*6/uL (ref 4.20–5.82)
RDW: 16.1 % — ABNORMAL HIGH (ref 11.0–14.6)
WBC: 3.2 10*3/uL — ABNORMAL LOW (ref 4.0–10.3)

## 2016-01-31 LAB — COMPREHENSIVE METABOLIC PANEL
ALT: 12 U/L (ref 0–55)
AST: 15 U/L (ref 5–34)
Albumin: 3.6 g/dL (ref 3.5–5.0)
Alkaline Phosphatase: 56 U/L (ref 40–150)
Anion Gap: 10 mEq/L (ref 3–11)
BUN: 10.3 mg/dL (ref 7.0–26.0)
CALCIUM: 9.7 mg/dL (ref 8.4–10.4)
CHLORIDE: 106 meq/L (ref 98–109)
CO2: 29 meq/L (ref 22–29)
Creatinine: 0.8 mg/dL (ref 0.7–1.3)
EGFR: 86 mL/min/{1.73_m2} — ABNORMAL LOW (ref >90)
Glucose: 76 mg/dl (ref 70–140)
POTASSIUM: 4 meq/L (ref 3.5–5.1)
Sodium: 145 mEq/L (ref 136–145)
Total Bilirubin: 0.67 mg/dL (ref 0.20–1.20)
Total Protein: 6.7 g/dL (ref 6.4–8.3)

## 2016-01-31 NOTE — Telephone Encounter (Signed)
Call from Manuela Schwartz, Radiology scheduler reporting [facility] technician is requesting PET order to be changed to initial, whole body.  Reviewed with [name], new order entered.

## 2016-02-01 ENCOUNTER — Encounter (HOSPITAL_COMMUNITY)
Admission: RE | Admit: 2016-02-01 | Discharge: 2016-02-01 | Disposition: A | Discharge status: Home / Self Care | Payer: Medicare Other | Source: Ambulatory Visit | Attending: Oncology | Admitting: Oncology

## 2016-02-01 DIAGNOSIS — C9000 Multiple myeloma not having achieved remission: Secondary | ICD-10-CM | POA: Insufficient documentation

## 2016-02-01 LAB — KAPPA/LAMBDA LIGHT CHAINS
IG KAPPA FREE LIGHT CHAIN: 11.8 mg/L (ref 3.3–19.4)
IG LAMBDA FREE LIGHT CHAIN: 141.2 mg/L — AB (ref 5.7–26.3)
Kappa/Lambda FluidC Ratio: 0.08 — ABNORMAL LOW (ref 0.26–1.65)

## 2016-02-01 LAB — IGA: IGA/IMMUNOGLOBULIN A, SERUM: 979 mg/dL — AB (ref 61–437)

## 2016-02-01 LAB — GLUCOSE, CAPILLARY: Glucose-Capillary: 104 mg/dL — ABNORMAL HIGH (ref 65–99)

## 2016-02-01 MED ORDER — FLUDEOXYGLUCOSE F - 18 (FDG) INJECTION
8.7300 | Freq: Once | INTRAVENOUS | Status: AC | PRN
  Administered 2016-02-01: 8.73 via INTRAVENOUS

## 2016-02-06 ENCOUNTER — Telehealth: Payer: Self-pay | Admitting: Oncology

## 2016-02-06 ENCOUNTER — Ambulatory Visit (HOSPITAL_BASED_OUTPATIENT_CLINIC_OR_DEPARTMENT_OTHER): Payer: Medicare Other | Admitting: Oncology

## 2016-02-06 VITALS — BP 136/54 | HR 58 | Temp 98.2°F | Resp 18 | Wt 177.1 lb

## 2016-02-06 DIAGNOSIS — D709 Neutropenia, unspecified: Secondary | ICD-10-CM | POA: Diagnosis not present

## 2016-02-06 DIAGNOSIS — C9000 Multiple myeloma not having achieved remission: Secondary | ICD-10-CM | POA: Diagnosis not present

## 2016-02-06 DIAGNOSIS — C61 Malignant neoplasm of prostate: Secondary | ICD-10-CM | POA: Diagnosis not present

## 2016-02-06 DIAGNOSIS — D696 Thrombocytopenia, unspecified: Secondary | ICD-10-CM

## 2016-02-06 NOTE — Telephone Encounter (Signed)
Appointments scheduled per 1/10 LOS. Patient given AVS report and calendars with future scheduled appointments. °
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
## 2016-02-15 DIAGNOSIS — C61 Malignant neoplasm of prostate: Secondary | ICD-10-CM | POA: Diagnosis not present

## 2016-02-21 ENCOUNTER — Other Ambulatory Visit: Payer: Self-pay | Admitting: Oncology

## 2016-02-21 DIAGNOSIS — C9000 Multiple myeloma not having achieved remission: Secondary | ICD-10-CM

## 2016-02-22 DIAGNOSIS — C61 Malignant neoplasm of prostate: Secondary | ICD-10-CM | POA: Diagnosis not present

## 2016-02-22 DIAGNOSIS — R3915 Urgency of urination: Secondary | ICD-10-CM | POA: Diagnosis not present

## 2016-02-22 DIAGNOSIS — N401 Enlarged prostate with lower urinary tract symptoms: Secondary | ICD-10-CM | POA: Diagnosis not present

## 2016-03-17 ENCOUNTER — Other Ambulatory Visit: Payer: Self-pay | Admitting: Oncology

## 2016-03-17 DIAGNOSIS — C9000 Multiple myeloma not having achieved remission: Secondary | ICD-10-CM

## 2016-03-28 ENCOUNTER — Ambulatory Visit (HOSPITAL_COMMUNITY)
Admission: RE | Admit: 2016-03-28 | Discharge: 2016-03-28 | Disposition: A | Discharge status: Home / Self Care | Payer: Medicare Other | Source: Ambulatory Visit | Attending: Nurse Practitioner | Admitting: Nurse Practitioner

## 2016-03-28 ENCOUNTER — Ambulatory Visit (HOSPITAL_BASED_OUTPATIENT_CLINIC_OR_DEPARTMENT_OTHER): Payer: Medicare Other | Admitting: Nurse Practitioner

## 2016-03-28 ENCOUNTER — Encounter: Payer: Self-pay | Admitting: Nurse Practitioner

## 2016-03-28 ENCOUNTER — Telehealth: Payer: Self-pay | Admitting: Nurse Practitioner

## 2016-03-28 ENCOUNTER — Ambulatory Visit (HOSPITAL_BASED_OUTPATIENT_CLINIC_OR_DEPARTMENT_OTHER): Payer: Medicare Other

## 2016-03-28 ENCOUNTER — Encounter (HOSPITAL_COMMUNITY): Payer: Self-pay

## 2016-03-28 VITALS — BP 140/54 | HR 75 | Temp 98.6°F | Resp 18 | Ht 71.0 in | Wt 175.5 lb

## 2016-03-28 DIAGNOSIS — C61 Malignant neoplasm of prostate: Secondary | ICD-10-CM

## 2016-03-28 DIAGNOSIS — I2699 Other pulmonary embolism without acute cor pulmonale: Secondary | ICD-10-CM | POA: Insufficient documentation

## 2016-03-28 DIAGNOSIS — D701 Agranulocytosis secondary to cancer chemotherapy: Secondary | ICD-10-CM

## 2016-03-28 DIAGNOSIS — C9000 Multiple myeloma not having achieved remission: Secondary | ICD-10-CM | POA: Diagnosis not present

## 2016-03-28 DIAGNOSIS — J90 Pleural effusion, not elsewhere classified: Secondary | ICD-10-CM | POA: Diagnosis not present

## 2016-03-28 DIAGNOSIS — R911 Solitary pulmonary nodule: Secondary | ICD-10-CM | POA: Insufficient documentation

## 2016-03-28 DIAGNOSIS — D6959 Other secondary thrombocytopenia: Secondary | ICD-10-CM | POA: Diagnosis not present

## 2016-03-28 HISTORY — DX: Other pulmonary embolism without acute cor pulmonale: I26.99

## 2016-03-28 LAB — COMPREHENSIVE METABOLIC PANEL
ALT: 11 U/L (ref 0–55)
ANION GAP: 9 meq/L (ref 3–11)
AST: 13 U/L (ref 5–34)
Albumin: 3.4 g/dL — ABNORMAL LOW (ref 3.5–5.0)
Alkaline Phosphatase: 56 U/L (ref 40–150)
BUN: 9.3 mg/dL (ref 7.0–26.0)
CHLORIDE: 102 meq/L (ref 98–109)
CO2: 28 meq/L (ref 22–29)
CREATININE: 0.8 mg/dL (ref 0.7–1.3)
Calcium: 9.5 mg/dL (ref 8.4–10.4)
EGFR: 85 mL/min/{1.73_m2} — AB (ref >90)
Glucose: 108 mg/dl (ref 70–140)
POTASSIUM: 3.8 meq/L (ref 3.5–5.1)
Sodium: 140 mEq/L (ref 136–145)
Total Bilirubin: 1.09 mg/dL (ref 0.20–1.20)
Total Protein: 7.2 g/dL (ref 6.4–8.3)

## 2016-03-28 MED ORDER — IOPAMIDOL (ISOVUE-370) INJECTION 76%
INTRAVENOUS | Status: AC
  Filled 2016-03-28: qty 100

## 2016-03-28 MED ORDER — ENOXAPARIN SODIUM 80 MG/0.8ML ~~LOC~~ SOLN: 1.0000 mg/kg | Freq: Two times a day (BID) | SUBCUTANEOUS | 0 refills | Status: DC

## 2016-03-28 MED ORDER — ENOXAPARIN SODIUM 80 MG/0.8ML ~~LOC~~ SOLN
80.0000 mg | Freq: Once | SUBCUTANEOUS | Status: AC
  Administered 2016-03-28: 13:00:00 80 mg via SUBCUTANEOUS
  Filled 2016-03-28: qty 0.8

## 2016-03-28 MED ORDER — IOPAMIDOL (ISOVUE-370) INJECTION 76%
100.0000 mL | Freq: Once | INTRAVENOUS | Status: AC | PRN
  Administered 2016-03-28: 84 mL via INTRAVENOUS

## 2016-03-28 MED FILL — ENOXAPARIN 80 MG/0.8 ML SYR: 80 | 7 days supply | Qty: 11 | Fill #0
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
## 2016-03-28 NOTE — Telephone Encounter (Signed)
Appointments scheduled per 3/2 LOS. Patient notified.

## 2016-03-28 NOTE — Patient Instructions (Signed)
How and Where to Give Subcutaneous Enoxaparin Injections °Enoxaparin is an injectable medicine. It is used to help prevent blood clots from developing in your veins. Health care providers often use anticoagulants like enoxaparin to prevent clots following surgery. Enoxaparin is also used in combination with other medicines to treat blood clots and heart attacks. If blood clots are left untreated, they can be life threatening. °Enoxaparin comes in single-use syringes. You inject enoxaparin through a syringe into your belly (abdomen). You should change the injection site each time you give yourself a shot. Continue the enoxaparin injections as directed by your health care provider. Your health care provider will use blood clotting test results to decide when you can safely stop using enoxaparin injections. If your health care provider prescribes any additional medicines, use the medicines exactly as directed. °How do I inject enoxaparin? °1. Wash your hands with soap and water. °2. Clean the selected injection site as directed by your health care provider. °3. Remove the needle cap by pulling it straight off the syringe. °4. When using a prefilled syringe, do not push the air bubble out of the syringe before the injection. The air bubble will help you get all of the medicine out of the syringe. °5. Hold the syringe like a pencil using your writing hand. °6. Use your other hand to pinch and hold an inch of the cleansed skin. °7. Insert the entire needle straight down into the fold of skin. °8. Push the plunger with your thumb until the syringe is empty. °9. Pull the needle straight out of your skin. °10. Enoxaparin injection prefilled syringes and graduated prefilled syringes are available with a system that shields the needle after injection. After you have completed your injection and removed the needle from your skin, firmly push down on the plunger. The protective sleeve will automatically cover the needle and you  will hear a click. The click means the needle is safely covered. °11. Place the syringe in the nearest needle box, also called a sharps container. If you do not have a sharps container, you can use a hard-sided plastic container with a secure lid, such as an empty laundry detergent bottle. °What else do I need to know? °· Do not use enoxaparin if: °¨ You have allergies to heparin or pork products. °¨ You have been diagnosed with a condition called thrombocytopenia. °· Do not use the syringe or needle more than one time. °· Use medicines only as directed by your health care provider. °· Changes in medicines, supplements, diet, and illness can affect your anticoagulation therapy. Be sure to inform your health care provider of any of these changes. °· It is important that you tell all of your health care providers and your dentist that you are taking an anticoagulant, especially if you are injured or plan to have any type of procedure. °· While on anticoagulants, you will need to have blood tests done routinely as directed by your health care provider. °· While using this medicine, avoid physical activities or sports that could result in a fall or cause injury. °· Follow up with your laboratory test and health care provider appointments as directed. It is very important to keep your appointments. Not keeping appointments could result in a chronic or permanent injury, pain, or disability. °· Before giving your medicine, you should make sure the injection is a clear and colorless or pale yellow solution. If your medicine becomes discolored or if there are particles in the syringe, do not use   it and notify your health care provider. °· Keep your medicine safely stored at room temperature. °Contact a health care provider if: °· You develop any rashes on your skin. °· You have large areas of bruising on your skin. °· You have any worsening of the condition for which you take Enoxaparin. °· You develop a fever. °Get help  right away if: °· You develop bleeding problems such as: °¨ Bleeding from the gums or nose that does not stop quickly. °¨ Vomiting blood or coughing up blood. °¨ Blood in your urine. °¨ Blood in your stool, or stool that has a dark, tarry, or coffee grounds appearance. °¨ A cut that does not stop bleeding within 10 minutes. °These symptoms may represent a serious problem that is an emergency. Do not wait to see if the symptoms will go away. Get medical help right away. Call your local emergency services (911 in the [place] Do not drive yourself to the hospital.  °This information is not intended to replace advice given to you by your health care provider. Make sure you discuss any questions you have with your health care provider. °Document Released: 11/15/2003 Document Revised: 09/20/2015 Document Reviewed: 06/30/2013 °Elsevier Interactive Patient Education © 2017 [employer] ° °

## 2016-03-28 NOTE — Progress Notes (Signed)
Lovenox teaching done with patient and patient's wife.  Patient verbalized an understanding of administering Lovenox injection.  Teach back done.

## 2016-03-31 ENCOUNTER — Ambulatory Visit (HOSPITAL_COMMUNITY)
Admission: RE | Admit: 2016-03-31 | Discharge: 2016-03-31 | Disposition: A | Discharge status: Home / Self Care | Payer: Medicare Other | Source: Ambulatory Visit | Attending: Oncology | Admitting: Oncology

## 2016-03-31 ENCOUNTER — Ambulatory Visit (HOSPITAL_BASED_OUTPATIENT_CLINIC_OR_DEPARTMENT_OTHER): Payer: Medicare Other | Admitting: Nurse Practitioner

## 2016-03-31 VITALS — BP 136/79 | HR 80 | Temp 99.1°F | Resp 18 | Wt 174.1 lb

## 2016-03-31 DIAGNOSIS — I82812 Embolism and thrombosis of superficial veins of left lower extremities: Secondary | ICD-10-CM | POA: Insufficient documentation

## 2016-03-31 DIAGNOSIS — C9000 Multiple myeloma not having achieved remission: Secondary | ICD-10-CM | POA: Insufficient documentation

## 2016-03-31 DIAGNOSIS — I2699 Other pulmonary embolism without acute cor pulmonale: Secondary | ICD-10-CM | POA: Diagnosis not present

## 2016-03-31 DIAGNOSIS — L302 Cutaneous autosensitization: Secondary | ICD-10-CM | POA: Diagnosis not present

## 2016-03-31 DIAGNOSIS — D6959 Other secondary thrombocytopenia: Secondary | ICD-10-CM | POA: Diagnosis not present

## 2016-03-31 DIAGNOSIS — I872 Venous insufficiency (chronic) (peripheral): Secondary | ICD-10-CM | POA: Diagnosis not present

## 2016-03-31 DIAGNOSIS — C61 Malignant neoplasm of prostate: Secondary | ICD-10-CM | POA: Diagnosis not present

## 2016-03-31 DIAGNOSIS — D701 Agranulocytosis secondary to cancer chemotherapy: Secondary | ICD-10-CM | POA: Diagnosis not present

## 2016-03-31 DIAGNOSIS — Z7901 Long term (current) use of anticoagulants: Secondary | ICD-10-CM

## 2016-03-31 MED ORDER — RIVAROXABAN 20 MG PO TABS: 20.0000 mg | ORAL_TABLET | Freq: Every day | ORAL | 5 refills | Status: DC

## 2016-03-31 MED FILL — XARELTO 20 MG TABLET: 20 | 30 days supply | Qty: 30 | Fill #0

## 2016-03-31 NOTE — Progress Notes (Signed)
**  Preliminary report by tech**  Bilateral lower extremity venous duplex complete. There is no evidence of deep vein thrombosis involving the right and left lower extremities.  There is evidence of acute superficial vein thrombosis involving the lesser saphenous vein of the left lower extremity. There is no evidence of a superficial vein thrombosis involving the right lower extremity. Results were given to Ned Card NP.  03/31/16 1:42 PM Mitchell Lowery RVT

## 2016-03-31 NOTE — Progress Notes (Addendum)
[place] OFFICE PROGRESS NOTE   Diagnosis:  Multiple myeloma  INTERVAL HISTORY:   Mitchell Lowery returns as scheduled. He denies any difficulty with the Lovenox injections. The chest pain has resolved. He is able to take a good deep breath with no discomfort. He has mild dyspnea. No significant cough. No high fever or shaking chills.  Objective:  Vital signs in last 24 hours:  Blood pressure 136/79, pulse 80, temperature 99.1 F (37.3 C), temperature source Oral, resp. rate 18, weight 174 lb 1.6 oz (79 kg).    HEENT: No thrush or ulcers. Resp: Lungs are clear. Breath sounds mildly diminished at the right lower lung field. No respiratory distress. Cardio: Regular rate and rhythm. GI: Abdomen soft and nontender. No organomegaly. Vascular: Left leg appear slightly larger than the right leg. Palpable superficial cord beginning mid left medial lower leg. Calves soft and nontender. Neuro: Alert and oriented.    Lab Results:  Lab Results  Component Value Date   WBC 3.2 (L) 01/31/2016   HGB 14.0 01/31/2016   HCT 43.0 01/31/2016   MCV 86.1 01/31/2016   PLT 115 (L) 01/31/2016   NEUTROABS 1.5 01/31/2016    Imaging:  No results found.  Medications: I have reviewed the patient's current medications.  Assessment/Plan: 1. Multiple myeloma-confirmed on a bone marrow biopsy 08/07/2014, IgA lambda  Serum M spike and increased serum free lambda light chains  Myeloma FISH panel negative for chromosome 4, 11, 12, 13, 14, and 17 abnormalities, cytogenetics with no metaphases  Bone survey 08/15/2014 with indeterminant lucent skull lesions  Cycle 1 RVD 08/22/2014  Cycle 2 RVD 09/19/2014  Cycle 3 RVD 10/17/2014  Cycle 4 RVD 11/14/2014 (Decadron reduced to 20 mg weekly, Revlimid 15 mg days 1 through 14, Velcade 3/4 weeks)  Cycle 5 RVD 12/12/2014  Serum M spike not detected 12/26/2014  Cycle 6 RVD 01/09/2015  Maintenance Revlimid, 10 mg daily, 02/05/2015 ,  discontinue 02/26/2015 secondary to leg edema and diarrhea  Revlimid resumed at a dose of 10 mg every other day beginning 03/13/2015  PET scan 34/28/7681-LX hypermetabolic bone lesions, few lucent lesions in the spine and pelvis  2. 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 History of intermittent prostatitis  4. Skin rash 10/24/2014-referred to dermatology, biopsy consistent with local hypersensitivity reaction  5. 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 Left lower leg cellulitis 06/24/2015-treated with Keflex  7. Mild thrombocytopenia secondary to Revlimid versus myeloma  8. Mild neutropenia-likely secondary to Revlimid versus myeloma  9. Fall with a skull fracture and subarachnoid blood/right frontal lobe contusions 10/17/2015  10. Right lower lobe pulmonary embolus 03/28/2016. Lovenox initiated.  11. Lower extremity venous Doppler 03/31/2016-left superficial thrombosis lesser saphenous vein   Disposition: Mitchell Lowery appears stable. He will continue twice daily Lovenox for a total of one week (last dose 04/03/2016 p.m). He will begin Xarelto 20 mg daily on 04/04/2016. He will return for follow-up as scheduled on 04/10/2016. He will contact the office in the interim with any problems.  Patient seen with [name].    Ned Card ANP/GNP-BC   03/31/2016  2:35 PM  This was shared visit with Ned Card. Mitchell Lowery has an improved performance status since beginning anticoagulation therapy. We discussed options for treating the pulmonary embolism. He will complete a  one-week course of Lovenox and then begin Xarelto. He will resume Revlimid and follow-up with Dr. Amalia Hailey next week.  Mitchell Lowery  will return for a scheduled office visit next week.  Julieanne Manson, M.D.

## 2016-04-08 DIAGNOSIS — R5383 Other fatigue: Secondary | ICD-10-CM | POA: Diagnosis not present

## 2016-04-08 DIAGNOSIS — C9002 Multiple myeloma in relapse: Secondary | ICD-10-CM | POA: Diagnosis not present

## 2016-04-08 DIAGNOSIS — Z7901 Long term (current) use of anticoagulants: Secondary | ICD-10-CM | POA: Insufficient documentation

## 2016-04-08 DIAGNOSIS — I2699 Other pulmonary embolism without acute cor pulmonale: Secondary | ICD-10-CM | POA: Diagnosis not present

## 2016-04-08 DIAGNOSIS — Z79899 Other long term (current) drug therapy: Secondary | ICD-10-CM | POA: Diagnosis not present

## 2016-04-08 DIAGNOSIS — C9000 Multiple myeloma not having achieved remission: Secondary | ICD-10-CM | POA: Diagnosis not present

## 2016-04-08 DIAGNOSIS — Z6824 Body mass index (BMI) 24.0-24.9, adult: Secondary | ICD-10-CM | POA: Diagnosis not present

## 2016-04-08 DIAGNOSIS — S0291XS Unspecified fracture of skull, sequela: Secondary | ICD-10-CM | POA: Diagnosis not present

## 2016-04-08 DIAGNOSIS — K529 Noninfective gastroenteritis and colitis, unspecified: Secondary | ICD-10-CM | POA: Diagnosis not present

## 2016-04-09 DIAGNOSIS — E559 Vitamin D deficiency, unspecified: Secondary | ICD-10-CM | POA: Diagnosis not present

## 2016-04-09 DIAGNOSIS — C61 Malignant neoplasm of prostate: Secondary | ICD-10-CM | POA: Diagnosis not present

## 2016-04-09 DIAGNOSIS — R011 Cardiac murmur, unspecified: Secondary | ICD-10-CM | POA: Diagnosis not present

## 2016-04-09 DIAGNOSIS — R197 Diarrhea, unspecified: Secondary | ICD-10-CM | POA: Diagnosis not present

## 2016-04-09 DIAGNOSIS — I2699 Other pulmonary embolism without acute cor pulmonale: Secondary | ICD-10-CM | POA: Diagnosis not present

## 2016-04-09 DIAGNOSIS — C9000 Multiple myeloma not having achieved remission: Secondary | ICD-10-CM | POA: Diagnosis not present

## 2016-04-09 DIAGNOSIS — I10 Essential (primary) hypertension: Secondary | ICD-10-CM | POA: Diagnosis not present

## 2016-04-09 DIAGNOSIS — I700 Atherosclerosis of aorta: Secondary | ICD-10-CM | POA: Diagnosis not present

## 2016-04-09 DIAGNOSIS — N400 Enlarged prostate without lower urinary tract symptoms: Secondary | ICD-10-CM | POA: Diagnosis not present

## 2016-04-09 DIAGNOSIS — I499 Cardiac arrhythmia, unspecified: Secondary | ICD-10-CM | POA: Diagnosis not present

## 2016-04-10 ENCOUNTER — Telehealth: Payer: Self-pay | Admitting: Oncology

## 2016-04-10 ENCOUNTER — Ambulatory Visit (HOSPITAL_BASED_OUTPATIENT_CLINIC_OR_DEPARTMENT_OTHER): Payer: Medicare Other | Admitting: Oncology

## 2016-04-10 VITALS — BP 134/60 | HR 64 | Temp 97.4°F | Resp 18 | Wt 175.1 lb

## 2016-04-10 DIAGNOSIS — I2699 Other pulmonary embolism without acute cor pulmonale: Secondary | ICD-10-CM

## 2016-04-10 DIAGNOSIS — D6959 Other secondary thrombocytopenia: Secondary | ICD-10-CM

## 2016-04-10 DIAGNOSIS — C9000 Multiple myeloma not having achieved remission: Secondary | ICD-10-CM

## 2016-04-10 DIAGNOSIS — D701 Agranulocytosis secondary to cancer chemotherapy: Secondary | ICD-10-CM

## 2016-04-10 NOTE — Telephone Encounter (Signed)
Appointments scheduled per 3/15 LOS. Patient given AVS report and calendars with future scheduled appointments. °

## 2016-04-10 NOTE — Progress Notes (Signed)
  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 The right-sided chest discomfort has resolved. The area of phlebitis at the left lower leg has improved. He feels well. He plans to begin exercising this week. He saw Dr. Amalia Hailey on 04/08/2016. Dr. Amalia Hailey recommends continuing the Revlimid. Diarrhea is controlled with:*Remain and Lomotil.  Objective:  Vital signs in last 24 hours:  Blood pressure 134/60, pulse 64, temperature 97.4 F (36.3 C), temperature source Oral, resp. rate 18, weight 175 lb 1.6 oz (79.4 kg), SpO2 97 %.    HEENT: No thrush or ulcers Resp: Lungs clear bilaterally, no respiratory distress Cardio: Regular rate and rhythm with occasional premature beats GI: No hepatosplenomegaly Vascular: No leg edema, superficial firm varicosities at the left calf and left lower thigh appear less prominent   Lab Results:  [facility] on 04/08/2016-hemoglobin 13.4, platelets 225,000, white count 3.2, ANC 2.1 Lambda free light chains 18.56, IgA 1485, creatinine 0.77, potassium 4.0  Medications: I have reviewed the patient's current medications.  Assessment/Plan: 1. Multiple myeloma-confirmed on a bone marrow biopsy 08/07/2014, IgA lambda  Serum M spike and increased serum free lambda light chains  Myeloma FISH panel negative for chromosome 4, 11, 12, 13, 14, and 17 abnormalities, cytogenetics with no metaphases  Bone survey 08/15/2014 with indeterminant lucent skull lesions  Cycle 1 RVD 08/22/2014  Cycle 2 RVD 09/19/2014  Cycle 3 RVD 10/17/2014  Cycle 4 RVD 11/14/2014 (Decadron reduced to 20 mg weekly, Revlimid 15 mg days 1 through 14, Velcade 3/4 weeks)  Cycle 5 RVD 12/12/2014  Serum M spike not detected 12/26/2014  Cycle 6 RVD 01/09/2015  Maintenance Revlimid, 10 mg daily, 02/05/2015 , discontinue 02/26/2015 secondary to leg edema and diarrhea  Revlimid resumed at a dose of 10 mg  every other day beginning 03/13/2015  PET scan 16/10/9602-VW hypermetabolic bone lesions, few lucent lesions in the spine and pelvis  2. 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 History of intermittent prostatitis  4. Skin rash 10/24/2014-referred to dermatology, biopsy consistent with local hypersensitivity reaction  5. 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 Left lower leg cellulitis 06/24/2015-treated with Keflex  7. Mild thrombocytopenia secondary to Revlimid versus myeloma  8. Mild neutropenia-likely secondary to Revlimid versus myeloma  9. Fall with a skull fracture and subarachnoid blood/right frontal lobe contusions 10/17/2015  10. Right lower lobe pulmonary embolus 03/28/2016. Lovenox , transitioned to Xarelto     Disposition:  He appears well. He will continue Xarelto for treatment of the pulmonary embolism. He continues Revlimid, though the markers of myeloma continue to slowly rise. He is asymptomatic from the myeloma and the hemoglobin remains in the normal range.  He understands the plan to begin salvage therapy with daratumumab, Velcade, and Decadron if there is clinical progression.  [name] will return for an office and lab visit on 05/14/2016. He will contact us in the interim as needed.   [name], MD  04/10/2016  9:31 AM

## 2016-04-18 ENCOUNTER — Other Ambulatory Visit: Payer: Self-pay | Admitting: Oncology

## 2016-04-18 DIAGNOSIS — C9000 Multiple myeloma not having achieved remission: Secondary | ICD-10-CM

## 2016-04-28 ENCOUNTER — Other Ambulatory Visit: Payer: Self-pay | Admitting: *Deleted

## 2016-04-28 DIAGNOSIS — C9000 Multiple myeloma not having achieved remission: Secondary | ICD-10-CM

## 2016-04-28 MED ORDER — LENALIDOMIDE 10 MG PO CAPS: ORAL_CAPSULE | ORAL | 0 refills | Status: DC

## 2016-04-28 MED FILL — XARELTO 20 MG TABLET: 20 | 30 days supply | Qty: 30 | Fill #1

## 2016-05-14 ENCOUNTER — Ambulatory Visit (HOSPITAL_BASED_OUTPATIENT_CLINIC_OR_DEPARTMENT_OTHER): Payer: Medicare Other | Admitting: Nurse Practitioner

## 2016-05-14 ENCOUNTER — Other Ambulatory Visit (HOSPITAL_BASED_OUTPATIENT_CLINIC_OR_DEPARTMENT_OTHER): Payer: Medicare Other

## 2016-05-14 ENCOUNTER — Telehealth: Payer: Self-pay | Admitting: Nurse Practitioner

## 2016-05-14 VITALS — BP 131/67 | HR 51 | Temp 98.6°F | Resp 18 | Ht 71.0 in | Wt 176.5 lb

## 2016-05-14 DIAGNOSIS — I2699 Other pulmonary embolism without acute cor pulmonale: Secondary | ICD-10-CM

## 2016-05-14 DIAGNOSIS — C9000 Multiple myeloma not having achieved remission: Secondary | ICD-10-CM

## 2016-05-14 DIAGNOSIS — Z7901 Long term (current) use of anticoagulants: Secondary | ICD-10-CM | POA: Diagnosis not present

## 2016-05-14 LAB — CBC WITH DIFFERENTIAL/PLATELET
BASO%: 2.3 % — AB (ref 0.0–2.0)
Basophils Absolute: 0.1 10*3/uL (ref 0.0–0.1)
EOS ABS: 0.3 10*3/uL (ref 0.0–0.5)
EOS%: 10.4 % — ABNORMAL HIGH (ref 0.0–7.0)
HCT: 40.4 % (ref 38.4–49.9)
HGB: 13 g/dL (ref 13.0–17.1)
LYMPH%: 21.6 % (ref 14.0–49.0)
MCH: 28.7 pg (ref 27.2–33.4)
MCHC: 32.2 g/dL (ref 32.0–36.0)
MCV: 89.2 fL (ref 79.3–98.0)
MONO#: 0.3 10*3/uL (ref 0.1–0.9)
MONO%: 12.7 % (ref 0.0–14.0)
NEUT%: 53 % (ref 39.0–75.0)
NEUTROS ABS: 1.4 10*3/uL — AB (ref 1.5–6.5)
Platelets: 95 10*3/uL — ABNORMAL LOW (ref 140–400)
RBC: 4.53 10*6/uL (ref 4.20–5.82)
RDW: 14.9 % — ABNORMAL HIGH (ref 11.0–14.6)
WBC: 2.6 10*3/uL — AB (ref 4.0–10.3)
lymph#: 0.6 10*3/uL — ABNORMAL LOW (ref 0.9–3.3)

## 2016-05-14 LAB — COMPREHENSIVE METABOLIC PANEL
ALT: 13 U/L (ref 0–55)
AST: 16 U/L (ref 5–34)
Albumin: 3.5 g/dL (ref 3.5–5.0)
Alkaline Phosphatase: 48 U/L (ref 40–150)
Anion Gap: 9 mEq/L (ref 3–11)
BILIRUBIN TOTAL: 0.62 mg/dL (ref 0.20–1.20)
BUN: 12.6 mg/dL (ref 7.0–26.0)
CHLORIDE: 106 meq/L (ref 98–109)
CO2: 28 meq/L (ref 22–29)
Calcium: 9.3 mg/dL (ref 8.4–10.4)
Creatinine: 0.8 mg/dL (ref 0.7–1.3)
EGFR: 85 mL/min/{1.73_m2} — AB (ref >90)
GLUCOSE: 71 mg/dL (ref 70–140)
Potassium: 3.9 mEq/L (ref 3.5–5.1)
SODIUM: 143 meq/L (ref 136–145)
TOTAL PROTEIN: 7 g/dL (ref 6.4–8.3)

## 2016-05-14 NOTE — Progress Notes (Addendum)
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 He continues Revlimid every other day. He overall feels well. He has resumed exercise. Some days he notes that he fatigues more easily than on other days. No shortness of breath or chest pain. No bleeding. He denies nausea/vomiting. He notes improvement in bowel habits since beginning cholestyramine. He periodically notes tingling in his feet when he wakes up in the mornings.  Objective:  Vital signs in last 24 hours:  Blood pressure 131/67, pulse (!) 51, temperature 98.6 F (37 C), temperature source Oral, resp. rate 18, height '5\' 11"'  (1.803 m), weight 176 lb 8 oz (80.1 kg), SpO2 98 %.    HEENT: No thrush or ulcers. Resp: Lungs clear bilaterally. Cardio: Regular rate and rhythm. GI: Abdomen soft and nontender. No organomegaly. Vascular: No leg edema. Superficial firm varicosities at the left calf and left lower thigh, less prominent.    Lab Results:  Lab Results  Component Value Date   WBC 2.6 (L) 05/14/2016   HGB 13.0 05/14/2016   HCT 40.4 05/14/2016   MCV 89.2 05/14/2016   PLT 95 (L) 05/14/2016   NEUTROABS 1.4 (L) 05/14/2016    Imaging:  No results found.  Medications: I have reviewed the patient's current medications.  Assessment/Plan: 1. Multiple myeloma-confirmed on a bone marrow biopsy 08/07/2014, IgA lambda  Serum M spike and increased serum free lambda light chains  Myeloma FISH panel negative for chromosome 4, 11, 12, 13, 14, and 17 abnormalities, cytogenetics with no metaphases  Bone survey 08/15/2014 with indeterminant lucent skull lesions  Cycle 1 RVD 08/22/2014  Cycle 2 RVD 09/19/2014  Cycle 3 RVD 10/17/2014  Cycle 4 RVD 11/14/2014 (Decadron reduced to 20 mg weekly, Revlimid 15 mg days 1 through 14, Velcade 3/4 weeks)  Cycle 5 RVD 12/12/2014  Serum M spike not detected 12/26/2014  Cycle 6 RVD  01/09/2015  Maintenance Revlimid, 10 mg daily, 02/05/2015 , discontinue 02/26/2015 secondary to leg edema and diarrhea  Revlimid resumed at a dose of 10 mg every other day beginning 03/13/2015  PET scan 85/88/5027-XA hypermetabolic bone lesions, few lucent lesions in the spine and pelvis  2. 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 History of intermittent prostatitis  4. Skin rash 10/24/2014-referred to dermatology, biopsy consistent with local hypersensitivity reaction  5. 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 Left lower leg cellulitis 06/24/2015-treated with Keflex  7. Mild thrombocytopenia secondary to Revlimid versus myeloma  8. Mild neutropenia-likely secondary to Revlimid versus myeloma  9. Fall with a skull fracture and subarachnoid blood/right frontal lobe contusions 10/17/2015  10. Right lower lobe pulmonary embolus 03/28/2016. 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 He continues Revlimid. We will follow-up on the outstanding myeloma labs from today. The plan is to begin salvage therapy with daratumumab, Velcade and Decadron if there is clinical progression.  He will continue Xarelto for the recent right lower lobe pulmonary embolus.  He will return for a follow-up visit and labs in one month. He will contact the office in the interim with any problems.  Patient seen with [name].    Ned Card ANP/GNP-BC   05/14/2016  9:58 AM  This was a shared visit with  Ned Card. 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 He will continue Revlimid.  Julieanne Manson, M.D.

## 2016-05-14 NOTE — Telephone Encounter (Signed)
Gave patient AVS and calender per 4/18 los.  

## 2016-05-15 LAB — KAPPA/LAMBDA LIGHT CHAINS
IG KAPPA FREE LIGHT CHAIN: 11.7 mg/L (ref 3.3–19.4)
Ig Lambda Free Light Chain: 192.7 mg/L — ABNORMAL HIGH (ref 5.7–26.3)
Kappa/Lambda FluidC Ratio: 0.06 — ABNORMAL LOW (ref 0.26–1.65)

## 2016-05-15 LAB — IGA

## 2016-05-16 ENCOUNTER — Other Ambulatory Visit: Payer: Self-pay | Admitting: Oncology

## 2016-05-16 DIAGNOSIS — C9000 Multiple myeloma not having achieved remission: Secondary | ICD-10-CM

## 2016-05-26 ENCOUNTER — Other Ambulatory Visit: Payer: Self-pay | Admitting: *Deleted

## 2016-05-26 DIAGNOSIS — C9000 Multiple myeloma not having achieved remission: Secondary | ICD-10-CM

## 2016-05-30 DIAGNOSIS — Z23 Encounter for immunization: Secondary | ICD-10-CM | POA: Diagnosis not present

## 2016-05-30 DIAGNOSIS — Z1389 Encounter for screening for other disorder: Secondary | ICD-10-CM | POA: Diagnosis not present

## 2016-05-30 DIAGNOSIS — C61 Malignant neoplasm of prostate: Secondary | ICD-10-CM | POA: Diagnosis not present

## 2016-05-30 DIAGNOSIS — R011 Cardiac murmur, unspecified: Secondary | ICD-10-CM | POA: Diagnosis not present

## 2016-05-30 DIAGNOSIS — E559 Vitamin D deficiency, unspecified: Secondary | ICD-10-CM | POA: Diagnosis not present

## 2016-05-30 DIAGNOSIS — I700 Atherosclerosis of aorta: Secondary | ICD-10-CM | POA: Diagnosis not present

## 2016-05-30 DIAGNOSIS — N400 Enlarged prostate without lower urinary tract symptoms: Secondary | ICD-10-CM | POA: Diagnosis not present

## 2016-05-30 DIAGNOSIS — Z0000 Encounter for general adult medical examination without abnormal findings: Secondary | ICD-10-CM | POA: Diagnosis not present

## 2016-05-30 DIAGNOSIS — I10 Essential (primary) hypertension: Secondary | ICD-10-CM | POA: Diagnosis not present

## 2016-05-30 DIAGNOSIS — R197 Diarrhea, unspecified: Secondary | ICD-10-CM | POA: Diagnosis not present

## 2016-05-30 DIAGNOSIS — C9000 Multiple myeloma not having achieved remission: Secondary | ICD-10-CM | POA: Diagnosis not present

## 2016-05-30 DIAGNOSIS — I499 Cardiac arrhythmia, unspecified: Secondary | ICD-10-CM | POA: Diagnosis not present

## 2016-05-30 DIAGNOSIS — I2699 Other pulmonary embolism without acute cor pulmonale: Secondary | ICD-10-CM | POA: Diagnosis not present

## 2016-06-02 MED FILL — XARELTO 20 MG TABLET: 20 | 30 days supply | Qty: 30 | Fill #2

## 2016-06-11 ENCOUNTER — Other Ambulatory Visit (HOSPITAL_BASED_OUTPATIENT_CLINIC_OR_DEPARTMENT_OTHER): Payer: Medicare Other

## 2016-06-11 ENCOUNTER — Telehealth: Payer: Self-pay | Admitting: Oncology

## 2016-06-11 ENCOUNTER — Ambulatory Visit (HOSPITAL_BASED_OUTPATIENT_CLINIC_OR_DEPARTMENT_OTHER): Payer: Medicare Other | Admitting: Oncology

## 2016-06-11 VITALS — BP 120/62 | HR 63 | Temp 98.1°F | Resp 18 | Ht 71.0 in | Wt 174.9 lb

## 2016-06-11 DIAGNOSIS — C61 Malignant neoplasm of prostate: Secondary | ICD-10-CM | POA: Diagnosis not present

## 2016-06-11 DIAGNOSIS — D709 Neutropenia, unspecified: Secondary | ICD-10-CM

## 2016-06-11 DIAGNOSIS — C9000 Multiple myeloma not having achieved remission: Secondary | ICD-10-CM | POA: Diagnosis not present

## 2016-06-11 DIAGNOSIS — Z7901 Long term (current) use of anticoagulants: Secondary | ICD-10-CM | POA: Diagnosis not present

## 2016-06-11 DIAGNOSIS — E291 Testicular hypofunction: Secondary | ICD-10-CM

## 2016-06-11 DIAGNOSIS — I2699 Other pulmonary embolism without acute cor pulmonale: Secondary | ICD-10-CM

## 2016-06-11 LAB — CBC WITH DIFFERENTIAL/PLATELET
BASO%: 1.4 % (ref 0.0–2.0)
BASOS ABS: 0 10*3/uL (ref 0.0–0.1)
EOS ABS: 0.2 10*3/uL (ref 0.0–0.5)
EOS%: 8.3 % — AB (ref 0.0–7.0)
HCT: 39.4 % (ref 38.4–49.9)
HEMOGLOBIN: 13.2 g/dL (ref 13.0–17.1)
LYMPH#: 0.7 10*3/uL — AB (ref 0.9–3.3)
LYMPH%: 26.4 % (ref 14.0–49.0)
MCH: 29.8 pg (ref 27.2–33.4)
MCHC: 33.5 g/dL (ref 32.0–36.0)
MCV: 89 fL (ref 79.3–98.0)
MONO#: 0.4 10*3/uL (ref 0.1–0.9)
MONO%: 13.6 % (ref 0.0–14.0)
NEUT#: 1.3 10*3/uL — ABNORMAL LOW (ref 1.5–6.5)
NEUT%: 50.3 % (ref 39.0–75.0)
Platelets: 111 10*3/uL — ABNORMAL LOW (ref 140–400)
RBC: 4.43 10*6/uL (ref 4.20–5.82)
RDW: 16.1 % — AB (ref 11.0–14.6)
WBC: 2.6 10*3/uL — ABNORMAL LOW (ref 4.0–10.3)

## 2016-06-11 LAB — COMPREHENSIVE METABOLIC PANEL
ALT: 17 U/L (ref 0–55)
ANION GAP: 10 meq/L (ref 3–11)
AST: 16 U/L (ref 5–34)
Albumin: 3.6 g/dL (ref 3.5–5.0)
Alkaline Phosphatase: 49 U/L (ref 40–150)
BILIRUBIN TOTAL: 0.8 mg/dL (ref 0.20–1.20)
BUN: 13.9 mg/dL (ref 7.0–26.0)
CHLORIDE: 106 meq/L (ref 98–109)
CO2: 28 mEq/L (ref 22–29)
Calcium: 9.5 mg/dL (ref 8.4–10.4)
Creatinine: 0.8 mg/dL (ref 0.7–1.3)
EGFR: 83 mL/min/{1.73_m2} — AB (ref >90)
GLUCOSE: 76 mg/dL (ref 70–140)
Potassium: 4 mEq/L (ref 3.5–5.1)
Sodium: 145 mEq/L (ref 136–145)
TOTAL PROTEIN: 7.2 g/dL (ref 6.4–8.3)

## 2016-06-11 NOTE — Telephone Encounter (Signed)
Appointments scheduled per 06/11/16 los. Patient was given a copy of the AVS report and appointment schedule per 06/11/16 los. °

## 2016-06-11 NOTE — Progress Notes (Signed)
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 He feels well. He is exercising. No bleeding. He had a transient episode of "dizziness "1 morning. No associated symptoms. Cholestyramine controls the diarrhea.  Objective:  Vital signs in last 24 hours:  Blood pressure 120/62, pulse 63, temperature 98.1 F (36.7 C), temperature source Oral, resp. rate 18, height '5\' 11"'$  (1.803 m), weight 174 lb 14.4 oz (79.3 kg), SpO2 97 %.    HEENT: No thrush or ulcers Resp: Lungs clear bilaterally Cardio: Regular rate and rhythm GI: No hepatosplenomegaly Vascular: No leg edema, the superficial phlebitis at the lower left thigh is no longer palpable   Lab Results:  Lab Results  Component Value Date   WBC 2.6 (L) 06/11/2016   HGB 13.2 06/11/2016   HCT 39.4 06/11/2016   MCV 89.0 06/11/2016   PLT 111 (L) 06/11/2016   NEUTROABS 1.3 (L) 06/11/2016    CMP     Component Value Date/Time   NA 145 06/11/2016 0850   K 4.0 06/11/2016 0850   CL 106 10/26/2015 0422   CO2 28 06/11/2016 0850   GLUCOSE 76 06/11/2016 0850   BUN 13.9 06/11/2016 0850   CREATININE 0.8 06/11/2016 0850   CALCIUM 9.5 06/11/2016 0850   PROT 7.2 06/11/2016 0850   ALBUMIN 3.6 06/11/2016 0850   AST 16 06/11/2016 0850   ALT 17 06/11/2016 0850   ALKPHOS 49 06/11/2016 0850   BILITOT 0.80 06/11/2016 0850   GFRNONAA >60 10/26/2015 0422   GFRAA >60 10/26/2015 0422    Medications: I have reviewed the patient's current medications.  Assessment/Plan: 1. Multiple myeloma-confirmed on a bone marrow biopsy 08/07/2014, IgA lambda  Serum M spike and increased serum free lambda light chains  Myeloma FISH panel negative for chromosome 4, 11, 12, 13, 14, and 17 abnormalities, cytogenetics with no metaphases  Bone survey 08/15/2014 with indeterminant lucent skull lesions  Cycle 1 RVD 08/22/2014  Cycle 2 RVD  09/19/2014  Cycle 3 RVD 10/17/2014  Cycle 4 RVD 11/14/2014 (Decadron reduced to 20 mg weekly, Revlimid 15 mg days 1 through 14, Velcade 3/4 weeks)  Cycle 5 RVD 12/12/2014  Serum M spike not detected 12/26/2014  Cycle 6 RVD 01/09/2015  Maintenance Revlimid, 10 mg daily, 02/05/2015 , discontinue 02/26/2015 secondary to leg edema and diarrhea  Revlimid resumed at a dose of 10 mg every other day beginning 03/13/2015  PET scan 62/83/6629-[place] hypermetabolic bone lesions, few lucent lesions in the spine and pelvis  2. 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 History of intermittent prostatitis  4. Skin rash 10/24/2014-referred to dermatology, biopsy consistent with local hypersensitivity reaction  5. 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 Left lower leg cellulitis 06/24/2015-treated with Keflex  7. Mild thrombocytopenia secondary to Revlimid versus myeloma  8. Mild neutropenia-likely secondary to Revlimid versus myeloma  9. Fall with a skull fracture and subarachnoid blood/right frontal lobe contusions 10/17/2015  10. Right lower lobe pulmonary embolus 03/28/2016. 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 We will follow-up on the IgA level and serum light chains from today. He will return for an office and lab visit in one month.  The plan  is to initiate systemic therapy with daratumumab/Decadron/Velcade when there is clinical progression of the myeloma.  [name], MD  06/11/2016  9:50 AM

## 2016-06-12 LAB — IGA: IgA, Qn, Serum: 1422 mg/dL — ABNORMAL HIGH (ref 61–437)

## 2016-06-12 LAB — KAPPA/LAMBDA LIGHT CHAINS
IG KAPPA FREE LIGHT CHAIN: 9.4 mg/L (ref 3.3–19.4)
IG LAMBDA FREE LIGHT CHAIN: 223.9 mg/L — AB (ref 5.7–26.3)
Kappa/Lambda FluidC Ratio: 0.04 — ABNORMAL LOW (ref 0.26–1.65)

## 2016-06-16 ENCOUNTER — Other Ambulatory Visit: Payer: Self-pay | Admitting: Oncology

## 2016-06-16 DIAGNOSIS — C9000 Multiple myeloma not having achieved remission: Secondary | ICD-10-CM

## 2016-06-20 ENCOUNTER — Other Ambulatory Visit: Payer: Self-pay | Admitting: *Deleted

## 2016-06-20 DIAGNOSIS — C9000 Multiple myeloma not having achieved remission: Secondary | ICD-10-CM

## 2016-06-20 DIAGNOSIS — C61 Malignant neoplasm of prostate: Secondary | ICD-10-CM | POA: Diagnosis not present

## 2016-06-20 MED ORDER — REVLIMID 10 MG PO CAPS: ORAL_CAPSULE | ORAL | 0 refills | Status: DC

## 2016-06-27 DIAGNOSIS — Z8546 Personal history of malignant neoplasm of prostate: Secondary | ICD-10-CM | POA: Diagnosis not present

## 2016-06-27 DIAGNOSIS — N400 Enlarged prostate without lower urinary tract symptoms: Secondary | ICD-10-CM | POA: Diagnosis not present

## 2016-07-03 MED FILL — XARELTO 20 MG TABLET: 20 | 30 days supply | Qty: 30 | Fill #3

## 2016-07-11 ENCOUNTER — Other Ambulatory Visit (HOSPITAL_BASED_OUTPATIENT_CLINIC_OR_DEPARTMENT_OTHER): Payer: Medicare Other

## 2016-07-11 ENCOUNTER — Telehealth: Payer: Self-pay | Admitting: Oncology

## 2016-07-11 ENCOUNTER — Ambulatory Visit (HOSPITAL_BASED_OUTPATIENT_CLINIC_OR_DEPARTMENT_OTHER): Payer: Medicare Other | Admitting: Oncology

## 2016-07-11 VITALS — BP 122/49 | HR 49 | Temp 98.7°F | Resp 18 | Ht 71.0 in | Wt 176.0 lb

## 2016-07-11 DIAGNOSIS — Z7901 Long term (current) use of anticoagulants: Secondary | ICD-10-CM | POA: Diagnosis not present

## 2016-07-11 DIAGNOSIS — Z86711 Personal history of pulmonary embolism: Secondary | ICD-10-CM | POA: Diagnosis not present

## 2016-07-11 DIAGNOSIS — C9000 Multiple myeloma not having achieved remission: Secondary | ICD-10-CM | POA: Diagnosis not present

## 2016-07-11 DIAGNOSIS — R238 Other skin changes: Secondary | ICD-10-CM | POA: Diagnosis not present

## 2016-07-11 LAB — COMPREHENSIVE METABOLIC PANEL
ALBUMIN: 3.5 g/dL (ref 3.5–5.0)
ALK PHOS: 46 U/L (ref 40–150)
ALT: 16 U/L (ref 0–55)
ANION GAP: 9 meq/L (ref 3–11)
AST: 17 U/L (ref 5–34)
BILIRUBIN TOTAL: 0.77 mg/dL (ref 0.20–1.20)
BUN: 12.8 mg/dL (ref 7.0–26.0)
CO2: 28 mEq/L (ref 22–29)
CREATININE: 0.8 mg/dL (ref 0.7–1.3)
Calcium: 9.6 mg/dL (ref 8.4–10.4)
Chloride: 108 mEq/L (ref 98–109)
EGFR: 83 mL/min/{1.73_m2} — ABNORMAL LOW (ref >90)
Glucose: 83 mg/dl (ref 70–140)
Potassium: 3.8 mEq/L (ref 3.5–5.1)
Sodium: 144 mEq/L (ref 136–145)
Total Protein: 7 g/dL (ref 6.4–8.3)

## 2016-07-11 LAB — CBC WITH DIFFERENTIAL/PLATELET
BASO%: 2.1 % — AB (ref 0.0–2.0)
BASOS ABS: 0 10*3/uL (ref 0.0–0.1)
EOS%: 10.5 % — AB (ref 0.0–7.0)
Eosinophils Absolute: 0.3 10*3/uL (ref 0.0–0.5)
HEMATOCRIT: 40 % (ref 38.4–49.9)
HEMOGLOBIN: 13.2 g/dL (ref 13.0–17.1)
LYMPH#: 0.6 10*3/uL — AB (ref 0.9–3.3)
LYMPH%: 24.3 % (ref 14.0–49.0)
MCH: 29.9 pg (ref 27.2–33.4)
MCHC: 33.1 g/dL (ref 32.0–36.0)
MCV: 90.3 fL (ref 79.3–98.0)
MONO#: 0.3 10*3/uL (ref 0.1–0.9)
MONO%: 13.6 % (ref 0.0–14.0)
NEUT#: 1.2 10*3/uL — ABNORMAL LOW (ref 1.5–6.5)
NEUT%: 49.5 % (ref 39.0–75.0)
PLATELETS: 104 10*3/uL — AB (ref 140–400)
RBC: 4.42 10*6/uL (ref 4.20–5.82)
RDW: 15.7 % — AB (ref 11.0–14.6)
WBC: 2.4 10*3/uL — ABNORMAL LOW (ref 4.0–10.3)

## 2016-07-11 NOTE — Progress Notes (Addendum)
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 Good energy level. He continues Revlimid. The diarrhea is controlled with cholestyramine. He received a new generation zoster vaccine approximately 1 month ago. He reports developing a pruritic erythematous vesicular lesions over the arms and hands. Many of these have resolved, but several remain over the arms and hands. No fever.  Objective:  Vital signs in last 24 hours:  Blood pressure (!) 122/49, pulse (!) 49, temperature 98.7 F (37.1 C), temperature source Oral, resp. rate 18, height 5' 11" (1.803 m), weight 176 lb (79.8 kg), SpO2 98 %.    HEENT: No thrush or ulcers Resp: Lungs clear bilaterally Cardio: Regular rate and rhythm GI: No hepatosplenomegaly Vascular: No leg edema  Skin: There are several 1 mm erythematous slightly raised lesions over the right forearm and left hand. A few of the lesions at the left hand appear vesicular and are in a linear distribution. A few similar lesions over the back.     Lab Results:  Lab Results  Component Value Date   WBC 2.4 (L) 07/11/2016   HGB 13.2 07/11/2016   HCT 40.0 07/11/2016   MCV 90.3 07/11/2016   PLT 104 (L) 07/11/2016   NEUTROABS 1.2 (L) 07/11/2016    CMP     Component Value Date/Time   NA 145 06/11/2016 0850   K 4.0 06/11/2016 0850   CL 106 10/26/2015 0422   CO2 28 06/11/2016 0850   GLUCOSE 76 06/11/2016 0850   BUN 13.9 06/11/2016 0850   CREATININE 0.8 06/11/2016 0850   CALCIUM 9.5 06/11/2016 0850   PROT 7.2 06/11/2016 0850   ALBUMIN 3.6 06/11/2016 0850   AST 16 06/11/2016 0850   ALT 17 06/11/2016 0850   ALKPHOS 49 06/11/2016 0850   BILITOT 0.80 06/11/2016 0850   GFRNONAA >60 10/26/2015 0422   GFRAA >60 10/26/2015 0422   Medications: I have reviewed the patient's current medications.  Assessment/Plan: 1. Multiple myeloma-confirmed on a bone marrow biopsy  08/07/2014, IgA lambda  Serum M spike and increased serum free lambda light chains  Myeloma FISH panel negative for chromosome 4, 11, 12, 13, 14, and 17 abnormalities, cytogenetics with no metaphases  Bone survey 08/15/2014 with indeterminant lucent skull lesions  Cycle 1 RVD 08/22/2014  Cycle 2 RVD 09/19/2014  Cycle 3 RVD 10/17/2014  Cycle 4 RVD 11/14/2014 (Decadron reduced to 20 mg weekly, Revlimid 15 mg days 1 through 14, Velcade 3/4 weeks)  Cycle 5 RVD 12/12/2014  Serum M spike not detected 12/26/2014  Cycle 6 RVD 01/09/2015  Maintenance Revlimid, 10 mg daily, 02/05/2015 , discontinue 02/26/2015 secondary to leg edema and diarrhea  Revlimid resumed at a dose of 10 mg every other day beginning 03/13/2015  PET scan 61/44/3154-MG hypermetabolic bone lesions, few lucent lesions in the spine and pelvis  2. 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 History of intermittent prostatitis  4. Skin rash 10/24/2014-referred to dermatology, biopsy consistent with local hypersensitivity reaction  5. 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 Left lower leg cellulitis 06/24/2015-treated with Keflex  7. Mild thrombocytopenia secondary to Revlimid versus myeloma  8. Mild neutropenia-likely secondary to Revlimid versus myeloma  9. Fall with a skull fracture and subarachnoid blood/right  frontal lobe contusions 10/17/2015  10. Right lower lobe pulmonary embolus 03/28/2016. Lovenox , transitioned to Xarelto  11. 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 We will follow-up on the IgA level and light chains from today. He  will continue Revlimid.  He is scheduled to see Dr. Amalia Hailey next week.  The skin rash may be related to the zoster vaccine. He does not have symptoms to suggest disseminated zoster, but this is possible. He will contact us if the rash progresses. I recommended he not take the "booster "zoster vaccine.  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  Donneta Romberg, MD  07/11/2016  9:04 AM

## 2016-07-11 NOTE — Telephone Encounter (Signed)
Scheduled appt per 6/15 los - Gave patient AVS and calender per LOS.  

## 2016-07-12 LAB — IGA

## 2016-07-14 ENCOUNTER — Other Ambulatory Visit: Payer: Self-pay | Admitting: Oncology

## 2016-07-14 DIAGNOSIS — C9000 Multiple myeloma not having achieved remission: Secondary | ICD-10-CM

## 2016-07-14 LAB — KAPPA/LAMBDA LIGHT CHAINS
Ig Kappa Free Light Chain: 9.1 mg/L (ref 3.3–19.4)
Ig Lambda Free Light Chain: 237.8 mg/L — ABNORMAL HIGH (ref 5.7–26.3)
KAPPA/LAMBDA FLC RATIO: 0.04 — AB (ref 0.26–1.65)

## 2016-07-22 DIAGNOSIS — Z7901 Long term (current) use of anticoagulants: Secondary | ICD-10-CM | POA: Diagnosis not present

## 2016-07-22 DIAGNOSIS — S0281XS Fracture of other specified skull and facial bones, right side, sequela: Secondary | ICD-10-CM | POA: Diagnosis not present

## 2016-07-22 DIAGNOSIS — I609 Nontraumatic subarachnoid hemorrhage, unspecified: Secondary | ICD-10-CM | POA: Diagnosis not present

## 2016-07-22 DIAGNOSIS — C9000 Multiple myeloma not having achieved remission: Secondary | ICD-10-CM | POA: Diagnosis not present

## 2016-07-22 DIAGNOSIS — I2699 Other pulmonary embolism without acute cor pulmonale: Secondary | ICD-10-CM | POA: Diagnosis not present

## 2016-07-22 DIAGNOSIS — R197 Diarrhea, unspecified: Secondary | ICD-10-CM | POA: Diagnosis not present

## 2016-07-25 ENCOUNTER — Telehealth: Payer: Self-pay | Admitting: Pharmacist

## 2016-07-25 NOTE — Telephone Encounter (Signed)
Oral Chemotherapy Pharmacist Encounter  Prior authorization approval for Revlimid from [employer] Effective dates: 07/22/16-07/22/17 PA# [id number]  Prescription drug plan requires this medication be filled through a [place], PharmD, BCPS, BCOP 07/25/2016  9:34 AM [facility] [phone]

## 2016-07-31 MED FILL — XARELTO 20 MG TABLET: 20 | 30 days supply | Qty: 30 | Fill #4

## 2016-08-07 ENCOUNTER — Other Ambulatory Visit: Payer: Self-pay | Admitting: Oncology

## 2016-08-07 DIAGNOSIS — C9000 Multiple myeloma not having achieved remission: Secondary | ICD-10-CM

## 2016-08-08 ENCOUNTER — Other Ambulatory Visit: Payer: Self-pay | Admitting: *Deleted

## 2016-08-21 ENCOUNTER — Other Ambulatory Visit (HOSPITAL_BASED_OUTPATIENT_CLINIC_OR_DEPARTMENT_OTHER): Payer: Medicare Other

## 2016-08-21 ENCOUNTER — Ambulatory Visit (HOSPITAL_BASED_OUTPATIENT_CLINIC_OR_DEPARTMENT_OTHER): Payer: Medicare Other | Admitting: Oncology

## 2016-08-21 VITALS — BP 129/64 | HR 57 | Temp 98.6°F | Resp 18 | Ht 71.0 in | Wt 178.9 lb

## 2016-08-21 DIAGNOSIS — Z7901 Long term (current) use of anticoagulants: Secondary | ICD-10-CM

## 2016-08-21 DIAGNOSIS — D701 Agranulocytosis secondary to cancer chemotherapy: Secondary | ICD-10-CM | POA: Diagnosis not present

## 2016-08-21 DIAGNOSIS — C61 Malignant neoplasm of prostate: Secondary | ICD-10-CM | POA: Diagnosis not present

## 2016-08-21 DIAGNOSIS — D6959 Other secondary thrombocytopenia: Secondary | ICD-10-CM | POA: Diagnosis not present

## 2016-08-21 DIAGNOSIS — C9000 Multiple myeloma not having achieved remission: Secondary | ICD-10-CM

## 2016-08-21 DIAGNOSIS — I2699 Other pulmonary embolism without acute cor pulmonale: Secondary | ICD-10-CM

## 2016-08-21 LAB — CBC WITH DIFFERENTIAL/PLATELET
BASO%: 1.3 % (ref 0.0–2.0)
BASOS ABS: 0 10*3/uL (ref 0.0–0.1)
EOS%: 7.4 % — AB (ref 0.0–7.0)
Eosinophils Absolute: 0.2 10*3/uL (ref 0.0–0.5)
HEMATOCRIT: 38.7 % (ref 38.4–49.9)
HEMOGLOBIN: 12.5 g/dL — AB (ref 13.0–17.1)
LYMPH#: 0.6 10*3/uL — AB (ref 0.9–3.3)
LYMPH%: 26.6 % (ref 14.0–49.0)
MCH: 29.6 pg (ref 27.2–33.4)
MCHC: 32.3 g/dL (ref 32.0–36.0)
MCV: 91.7 fL (ref 79.3–98.0)
MONO#: 0.3 10*3/uL (ref 0.1–0.9)
MONO%: 13.1 % (ref 0.0–14.0)
NEUT#: 1.2 10*3/uL — ABNORMAL LOW (ref 1.5–6.5)
NEUT%: 51.6 % (ref 39.0–75.0)
Platelets: 88 10*3/uL — ABNORMAL LOW (ref 140–400)
RBC: 4.22 10*6/uL (ref 4.20–5.82)
RDW: 14 % (ref 11.0–14.6)
WBC: 2.3 10*3/uL — ABNORMAL LOW (ref 4.0–10.3)

## 2016-08-21 LAB — COMPREHENSIVE METABOLIC PANEL
ALK PHOS: 46 U/L (ref 40–150)
ALT: 14 U/L (ref 0–55)
AST: 14 U/L (ref 5–34)
Albumin: 3.3 g/dL — ABNORMAL LOW (ref 3.5–5.0)
Anion Gap: 9 mEq/L (ref 3–11)
BUN: 10.2 mg/dL (ref 7.0–26.0)
CALCIUM: 9.2 mg/dL (ref 8.4–10.4)
CHLORIDE: 107 meq/L (ref 98–109)
CO2: 28 mEq/L (ref 22–29)
Creatinine: 0.8 mg/dL (ref 0.7–1.3)
EGFR: 83 mL/min/{1.73_m2} — ABNORMAL LOW (ref >90)
GLUCOSE: 84 mg/dL (ref 70–140)
POTASSIUM: 3.9 meq/L (ref 3.5–5.1)
SODIUM: 144 meq/L (ref 136–145)
Total Bilirubin: 0.69 mg/dL (ref 0.20–1.20)
Total Protein: 6.7 g/dL (ref 6.4–8.3)

## 2016-08-21 NOTE — Progress Notes (Signed)
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 The skin lesions resolved. Diarrhea is controlled with cholestyramine. No bleeding. He saw Dr. Amalia Hailey last month. Dr. Amalia Hailey recommends continuing Revlimid with the understanding that he will likely need treatment with daratumumab-based therapy in the future.   Objective:  Vital signs in last 24 hours:  Blood pressure 129/64, pulse (!) 57, temperature 98.6 F (37 C), temperature source Oral, resp. rate 18, height _0  (1.803 m), weight 178 lb 14.4 oz (81.1 kg), SpO2 99 %.    HEENT: No thrush or ulcers Resp: Lungs clear bilaterally Cardio: Regular rate and rhythm GI: No hepatosplenomegaly Vascular: No leg edema  Skin: No skin lesions over the arms or hands     Lab Results:  Lab Results  Component Value Date   WBC 2.3 (L) 08/21/2016   HGB 12.5 (L) 08/21/2016   HCT 38.7 08/21/2016   MCV 91.7 08/21/2016   PLT 88 (L) 08/21/2016   NEUTROABS 1.2 (L) 08/21/2016    CMP     Component Value Date/Time   NA 144 08/21/2016 0834   K 3.9 08/21/2016 0834   CL 106 10/26/2015 0422   CO2 28 08/21/2016 0834   GLUCOSE 84 08/21/2016 0834   BUN 10.2 08/21/2016 0834   CREATININE 0.8 08/21/2016 0834   CALCIUM 9.2 08/21/2016 0834   PROT 6.7 08/21/2016 0834   ALBUMIN 3.3 (L) 08/21/2016 0834   AST 14 08/21/2016 0834   ALT 14 08/21/2016 0834   ALKPHOS 46 08/21/2016 0834   BILITOT 0.69 08/21/2016 0834   GFRNONAA >60 10/26/2015 0422   GFRAA >60 10/26/2015 0422     Medications: I have reviewed the patient's current medications.  Assessment/Plan: 1. Multiple myeloma-confirmed on a bone marrow biopsy 08/07/2014, IgA lambda  Serum M spike and increased serum free lambda light chains  Myeloma FISH panel negative for chromosome 4, 11, 12, 13, 14, and 17 abnormalities, cytogenetics with no metaphases  Bone survey 08/15/2014  with indeterminant lucent skull lesions  Cycle 1 RVD 08/22/2014  Cycle 2 RVD 09/19/2014  Cycle 3 RVD 10/17/2014  Cycle 4 RVD 11/14/2014 (Decadron reduced to 20 mg weekly, Revlimid 15 mg days 1 through 14, Velcade 3/4 weeks)  Cycle 5 RVD 12/12/2014  Serum M spike not detected 12/26/2014  Cycle 6 RVD 01/09/2015  Maintenance Revlimid, 10 mg daily, 02/05/2015 , discontinue 02/26/2015 secondary to leg edema and diarrhea  Revlimid resumed at a dose of 10 mg every other day beginning 03/13/2015  PET scan 30/09/2328-QT hypermetabolic bone lesions, few lucent lesions in the spine and pelvis  2. 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 History of intermittent prostatitis  4. Skin rash 10/24/2014-referred to dermatology, biopsy consistent with local hypersensitivity reaction  5. 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 Left lower leg cellulitis 06/24/2015-treated with Keflex  7. Mild thrombocytopenia secondary to Revlimid versus myeloma  8. Mild neutropenia-likely secondary to Revlimid versus myeloma  9. Fall with a skull fracture and subarachnoid blood/right frontal lobe contusions 10/17/2015  10. Right lower lobe pulmonary embolus 03/28/2016. Lovenox , transitioned to Xarelto  11. 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 He will  continue Revlimid. He has mild thrombocytopenia and is maintained on Xarelto. He will contact us for bleeding. We will follow-up on the light chains and IgA level from today. He will return for an office and lab visit in approximately one month.  Donneta Romberg, MD  08/21/2016  10:04 AM

## 2016-08-22 ENCOUNTER — Telehealth: Payer: Self-pay

## 2016-08-22 ENCOUNTER — Encounter (HOSPITAL_COMMUNITY): Payer: Self-pay | Admitting: Emergency Medicine

## 2016-08-22 ENCOUNTER — Emergency Department (HOSPITAL_COMMUNITY): Payer: Medicare Other

## 2016-08-22 ENCOUNTER — Emergency Department (HOSPITAL_COMMUNITY)
Admission: EM | Admit: 2016-08-22 | Discharge: 2016-08-22 | Disposition: A | Discharge status: Home / Self Care | Payer: Medicare Other | Attending: Emergency Medicine | Admitting: Emergency Medicine

## 2016-08-22 DIAGNOSIS — Z7901 Long term (current) use of anticoagulants: Secondary | ICD-10-CM | POA: Insufficient documentation

## 2016-08-22 DIAGNOSIS — R079 Chest pain, unspecified: Secondary | ICD-10-CM | POA: Diagnosis not present

## 2016-08-22 DIAGNOSIS — R1011 Right upper quadrant pain: Secondary | ICD-10-CM | POA: Insufficient documentation

## 2016-08-22 DIAGNOSIS — Z86711 Personal history of pulmonary embolism: Secondary | ICD-10-CM | POA: Diagnosis not present

## 2016-08-22 DIAGNOSIS — R0789 Other chest pain: Secondary | ICD-10-CM | POA: Insufficient documentation

## 2016-08-22 DIAGNOSIS — Z79899 Other long term (current) drug therapy: Secondary | ICD-10-CM | POA: Diagnosis not present

## 2016-08-22 LAB — KAPPA/LAMBDA LIGHT CHAINS
IG KAPPA FREE LIGHT CHAIN: 9.2 mg/L (ref 3.3–19.4)
IG LAMBDA FREE LIGHT CHAIN: 206.2 mg/L — AB (ref 5.7–26.3)
Kappa/Lambda FluidC Ratio: 0.04 — ABNORMAL LOW (ref 0.26–1.65)

## 2016-08-22 LAB — CBC WITH DIFFERENTIAL/PLATELET
BASOS ABS: 0 10*3/uL (ref 0.0–0.1)
BASOS PCT: 2 %
EOS ABS: 0.2 10*3/uL (ref 0.0–0.7)
Eosinophils Relative: 8 %
HCT: 38 % — ABNORMAL LOW (ref 39.0–52.0)
HEMOGLOBIN: 12.9 g/dL — AB (ref 13.0–17.0)
Lymphocytes Relative: 22 %
Lymphs Abs: 0.5 10*3/uL — ABNORMAL LOW (ref 0.7–4.0)
MCH: 30.1 pg (ref 26.0–34.0)
MCHC: 33.9 g/dL (ref 30.0–36.0)
MCV: 88.6 fL (ref 78.0–100.0)
MONOS PCT: 14 %
Monocytes Absolute: 0.3 10*3/uL (ref 0.1–1.0)
NEUTROS ABS: 1.3 10*3/uL — AB (ref 1.7–7.7)
Neutrophils Relative %: 54 %
Platelets: 86 10*3/uL — ABNORMAL LOW (ref 150–400)
RBC: 4.29 MIL/uL (ref 4.22–5.81)
RDW: 13.9 % (ref 11.5–15.5)
WBC: 2.4 10*3/uL — ABNORMAL LOW (ref 4.0–10.5)

## 2016-08-22 LAB — COMPREHENSIVE METABOLIC PANEL
ALK PHOS: 42 U/L (ref 38–126)
ALT: 16 U/L — AB (ref 17–63)
AST: 18 U/L (ref 15–41)
Albumin: 3.6 g/dL (ref 3.5–5.0)
Anion gap: 10 (ref 5–15)
BILIRUBIN TOTAL: 0.8 mg/dL (ref 0.3–1.2)
BUN: 11 mg/dL (ref 6–20)
CALCIUM: 9.1 mg/dL (ref 8.9–10.3)
CO2: 26 mmol/L (ref 22–32)
CREATININE: 0.76 mg/dL (ref 0.61–1.24)
Chloride: 107 mmol/L (ref 101–111)
GFR calc Af Amer: >60 mL/min (ref >60)
GFR calc non Af Amer: >60 mL/min (ref >60)
GLUCOSE: 99 mg/dL (ref 65–99)
Potassium: 3.8 mmol/L (ref 3.5–5.1)
SODIUM: 143 mmol/L (ref 135–145)
Total Protein: 7 g/dL (ref 6.5–8.1)

## 2016-08-22 LAB — IGA: IgA, Qn, Serum: 1479 mg/dL — ABNORMAL HIGH (ref 61–437)

## 2016-08-22 LAB — I-STAT TROPONIN, ED: Troponin i, poc: 0.01 ng/mL (ref 0.00–0.08)

## 2016-08-22 LAB — I-STAT CHEM 8, ED
BUN: 10 mg/dL (ref 6–20)
CALCIUM ION: 1.17 mmol/L (ref 1.15–1.40)
CHLORIDE: 104 mmol/L (ref 101–111)
CREATININE: 0.7 mg/dL (ref 0.61–1.24)
Glucose, Bld: 95 mg/dL (ref 65–99)
HCT: 39 % (ref 39.0–52.0)
Hemoglobin: 13.3 g/dL (ref 13.0–17.0)
Potassium: 3.8 mmol/L (ref 3.5–5.1)
SODIUM: 142 mmol/L (ref 135–145)
TCO2: 25 mmol/L (ref 0–100)

## 2016-08-22 LAB — BRAIN NATRIURETIC PEPTIDE: B Natriuretic Peptide: 79.1 pg/mL (ref 0.0–100.0)

## 2016-08-22 MED ORDER — IOPAMIDOL (ISOVUE-370) INJECTION 76%
100.0000 mL | Freq: Once | INTRAVENOUS | Status: AC | PRN
  Administered 2016-08-22: 100 mL via INTRAVENOUS

## 2016-08-22 MED ORDER — ACETAMINOPHEN 500 MG PO TABS: 1000.0000 mg | ORAL_TABLET | Freq: Once | ORAL | Status: DC

## 2016-08-22 MED ORDER — IOPAMIDOL (ISOVUE-370) INJECTION 76%
INTRAVENOUS | Status: AC
  Administered 2016-08-22: 11:00:00
  Filled 2016-08-22: qty 100

## 2016-08-22 NOTE — ED Triage Notes (Signed)
Pt reports RUQ pain since last night. Pt reports pain is similar to when he had a PE in February, but not as bad. No SOB, lightheadedness, or central CP.

## 2016-08-22 NOTE — ED Provider Notes (Signed)
[facility] DEPT Provider Note   CSN: [id number] Arrival date & time: 08/22/16  8921     History   Chief Complaint Chief Complaint  Patient presents with  . Abdominal Pain    HPI Mitchell Lowery is a 82 y.o. male.  82 yo M with a chief complaint of right-sided chest pain. This been going on since yesterday. Patient has a history of a prior pulmonary embolism that was on that side. He feels that the symptoms feel similar. The pain is sharp worse with movement palpation twisting. He did lift a heavy suitcase yesterday out of a car and thinks that that may have caused the pain. He is on Xarelto for his pulmonary embolus. States that he has been compliant with the medication. Denies exertional symptoms denies shortness of breath. He denies vomiting fever, pain worse with eating.   The history is provided by the patient and the spouse.  Abdominal Pain   This is a new problem. The current episode started yesterday. The problem occurs constantly. The problem has been gradually worsening. Associated with: lifting a suitcase. The pain is located in the RUQ. The quality of the pain is sharp and shooting. The pain is at a severity of 9/10. The pain is moderate. Pertinent negatives include fever, diarrhea, vomiting, headaches, arthralgias and myalgias. Nothing aggravates the symptoms. Nothing relieves the symptoms.    Past Medical History:  Diagnosis Date  . Anal fistula    treated in [place]  . Anemia    only due to myeloma- "normal now"  . Heart murmur   . Hemorrhoids   . Multiple myeloma (HCC)    1 month remission now-last tx. 1 month ago- /[name]  . Prostate cancer [facility])    urinary retention, surgery planned-prior radiation, and seed implant about 1 year ago.  . Prostatitis   . Pulmonary embolus, left ([place]) 03/28/2016  . UTI (lower urinary tract infection)    multiple urinary trract infection- being tx presently with antibiotic at present.    Patient Active Problem List   Diagnosis Date Noted  . Pulmonary embolus, left ([place]) 03/28/2016  . Skull fracture ([place]) 10/25/2015  . Subarachnoid hemorrhage following injury ([place]) 10/25/2015  . Loss of consciousness ([place]) 10/25/2015  . Infection of urinary tract 01/09/2015  . Multiple myeloma ([place]) 08/15/2014  . Malignant neoplasm of prostate ([place]) 03/10/2014    Past Surgical History:  Procedure Laterality Date  . ANAL FISTULECTOMY    . ANKLE SURGERY    . CYSTOSCOPY    . CYSTOSCOPY WITH INSERTION OF UROLIFT N/A 03/12/2015   Procedure: CYSTOSCOPY WITH INSERTION OF UROLIFT;  Surgeon: Franchot Gallo, MD;  Location: [facility];  Service: Urology;  Laterality: N/A;  . PROSTATE BIOPSY    . PROSTATE BIOPSY    . RADIOACTIVE SEED IMPLANT N/A 07/21/2014   Procedure: RADIOACTIVE SEED IMPLANT/BRACHYTHERAPY IMPLANT;  Surgeon: Franchot Gallo, MD;  Location: [facility];  Service: Urology;  Laterality: N/A;  . TONSILLECTOMY         Home Medications    Prior to Admission medications   Medication Sig Start Date End Date Taking? Authorizing Provider  cholecalciferol (VITAMIN D) 1000 units tablet Take 1,000 Units by mouth daily.   Yes [provider]  cholestyramine (QUESTRAN) 4 g packet Take 1 packet by mouth 3 (three) times daily as needed.  04/08/16  Yes [provider]  diphenoxylate-atropine (LOMOTIL) 2.5-0.025 MG tablet Take 1 tablet by mouth daily as needed for diarrhea or loose stools.  03/11/16  Yes [provider]  losartan (COZAAR) 50 MG tablet Take 50 mg by mouth daily.    Yes [provider]  REVLIMID 10 MG capsule TAKE 1 CAPSULE (10MG) BY MOUTH EVERY OTHER DAY. 08/08/16  Yes Ladell Pier, MD  rivaroxaban (XARELTO) 20 MG TABS tablet Take 1 tablet (20 mg total) by mouth daily with supper. 03/31/16  Yes Owens Shark, NP    Family History Family History  Problem Relation Age of Onset  . Cancer Maternal Grandfather        mouth/throat ca    Social  History Social History  Substance Use Topics  . Smoking status: Never Smoker  . Smokeless tobacco: Never Used  . Alcohol use Yes     Comment: 1 glass wine daily     Allergies   Patient has no known allergies.   Review of Systems Review of Systems  Constitutional: Negative for chills and fever.  HENT: Negative for congestion and facial swelling.   Eyes: Negative for discharge and visual disturbance.  Respiratory: Negative for shortness of breath.   Cardiovascular: Positive for chest pain. Negative for palpitations.  Gastrointestinal: Negative for abdominal pain, diarrhea and vomiting.  Musculoskeletal: Negative for arthralgias and myalgias.  Skin: Negative for color change and rash.  Neurological: Negative for tremors, syncope and headaches.  Psychiatric/Behavioral: Negative for confusion and dysphoric mood.     Physical Exam Updated Vital Signs BP (!) 133/112   Pulse (!) 45   Temp 98.1 F (36.7 C) (Oral)   Resp 14   SpO2 98%   Physical Exam  Constitutional: He is oriented to person, place, and time. He appears well-developed and well-nourished.  HENT:  Head: Normocephalic and atraumatic.  Eyes: Pupils are equal, round, and reactive to light. EOM are normal.  Neck: Normal range of motion. Neck supple. No JVD present.  Cardiovascular: Normal rate and regular rhythm.  Exam reveals no gallop and no friction rub.   No murmur heard. Pulmonary/Chest: No respiratory distress. He has no wheezes.  Abdominal: He exhibits no distension and no mass. There is tenderness (RUQ, negative murphys). There is no rebound and no guarding.  Musculoskeletal: Normal range of motion.  Neurological: He is alert and oriented to person, place, and time.  Skin: No rash noted. No pallor.  Psychiatric: He has a normal mood and affect. His behavior is normal.  Nursing note and vitals reviewed.    ED Treatments / Results  Labs (all labs ordered are listed, but only abnormal results are  displayed) Labs Reviewed  CBC WITH DIFFERENTIAL/PLATELET - Abnormal; Notable for the following:       Result Value   WBC 2.4 (*)    Hemoglobin 12.9 (*)    HCT 38.0 (*)    Platelets 86 (*)    Neutro Abs 1.3 (*)    Lymphs Abs 0.5 (*)    All other components within normal limits  COMPREHENSIVE METABOLIC PANEL - Abnormal; Notable for the following:    ALT 16 (*)    All other components within normal limits  BRAIN NATRIURETIC PEPTIDE  I-STAT TROPONIN, ED  I-STAT CHEM 8, ED    EKG  EKG Interpretation  Date/Time:  Friday August 22 2016 09:38:45 EDT Ventricular Rate:  46 PR Interval:    QRS Duration: 96 QT Interval:  427 QTC Calculation: 374 R Axis:   59 Text Interpretation:  Sinus bradycardia Consider left atrial enlargement Abnormal R-wave progression, early transition No significant change since last tracing  Confirmed by Deno Etienne [phone]) on 08/22/2016 10:24:49 AM       Radiology Ct Angio Chest Pe W And/or Wo Contrast  Result Date: 08/22/2016 CLINICAL DATA:  Right-sided chest pain. Evaluate for pulmonary embolus. EXAM: CT ANGIOGRAPHY CHEST WITH CONTRAST TECHNIQUE: Multidetector CT imaging of the chest was performed using the standard protocol during bolus administration of intravenous contrast. Multiplanar CT image reconstructions and MIPs were obtained to evaluate the vascular anatomy. CONTRAST:  100 cc of Isovue 370 COMPARISON:  03/28/2016 FINDINGS: Cardiovascular: The heart size appears within normal limits. No pericardial effusion. Aortic atherosclerosis. Calcification in the LAD coronary artery identified. The main pulmonary artery appears patent. There is no lobar or segmental pulmonary artery filling defects. Mediastinum/Nodes: The trachea appears patent and is midline. Normal appearance of the esophagus. Lungs/Pleura: No pleural effusion. Chronic pleural-parenchymal scarring in the posterior right base is again noted. Calcified granuloma identified in the right upper lobe. Upper  Abdomen: No acute abnormality. Musculoskeletal: No chest wall abnormality. No acute or significant osseous findings. Review of the MIP images confirms the above findings. IMPRESSION: 1. No evidence for acute pulmonary embolus 2. Aortic Atherosclerosis (ICD10-I70.0). Coronary artery calcifications noted. 3. Chronic pleural-parenchymal scarring in the right lung base. Electronically Signed   By: Kerby Moors M.D.   On: 08/22/2016 10:52    Procedures Procedures (including critical care time)  Medications Ordered in ED Medications  acetaminophen (TYLENOL) tablet 1,000 mg (1,000 mg Oral Refused 08/22/16 1125)  iopamidol (ISOVUE-370) 76 % injection (  Contrast Given 08/22/16 1124)  iopamidol (ISOVUE-370) 76 % injection 100 mL (100 mLs Intravenous Contrast Given 08/22/16 1033)     Initial Impression / Assessment and Plan / ED Course  I have reviewed the triage vital signs and the nursing notes.  Pertinent labs & imaging results that were available during my care of the patient were reviewed by me and considered in my medical decision making (see chart for details).     82 yo M Chest pain. On exam this is localized to the right upper quadrant. Worse with movement twisting palpation. Without vomiting and fever I doubt this is acute cholecystitis. Patient states that it felt similar to her prior PE. I discussed with him that I feel this is unlikely with him being compliant on his blood thinner. Will obtain a CT scan, labs.  CT negative, labs reassuring.  D/c home.   11:30 AM:  I have discussed the diagnosis/risks/treatment options with the patient and family and believe the pt to be eligible for discharge home to follow-up with Oncology. We also discussed returning to the ED immediately if new or worsening sx occur. We discussed the sx which are most concerning (e.g., sudden worsening pain, fever, inability to tolerate by mouth) that necessitate immediate return. Medications administered to the patient  during their visit and any new prescriptions provided to the patient are listed below.  Medications given during this visit Medications  acetaminophen (TYLENOL) tablet 1,000 mg (1,000 mg Oral Refused 08/22/16 1125)  iopamidol (ISOVUE-370) 76 % injection (  Contrast Given 08/22/16 1124)  iopamidol (ISOVUE-370) 76 % injection 100 mL (100 mLs Intravenous Contrast Given 08/22/16 1033)     The patient appears reasonably screen and/or stabilized for discharge and I doubt any other medical condition or other [facility] requiring further screening, evaluation, or treatment in the ED at this time prior to discharge.    Final Clinical Impressions(s) / ED Diagnoses   Final diagnoses:  RUQ abdominal pain    New  Prescriptions New Prescriptions   No medications on file     Deno Etienne, DO 08/22/16 1130

## 2016-08-22 NOTE — Telephone Encounter (Signed)
Telephone call to patient- No SOB, Fever, chills. Patient experienced the pain right before going to bed last night that increased in intensity through the night. He was fine all day yesterday- no exercises or injuries noted. Increased pain with inspirations 7/10 pain reported. Pain increases with movement also. No pain medication taken at this time.  Patient reports this is the same feeling he had in Feb when he had a PE. Patient reports he is taking Xarelto 20 mg daily. Spoke with Ned Card, NP who advised patient needs to report to ED.  Patient advised to go to [facility] ED. Patient will report to ED as soon as possible.

## 2016-08-22 NOTE — Telephone Encounter (Signed)
Getting to bed last night, pain in R side, worse during the night, same thing in February. Feels like the same thing. Not noticeable SOB, pain with deep breath, 5-6/10 with deep breath. Difficult to move d/t pain.   S/w Ned Card, she and Clarise Cruz are working on this issue

## 2016-08-22 NOTE — Telephone Encounter (Signed)
Pt called to let Lattie Haw and Dr Benay Spice know the CT was negative for PE.

## 2016-08-24 LAB — PROTEIN ELECTROPHORESIS, SERUM
A/G RATIO SPE: 1.1 (ref 0.7–1.7)
Albumin: 3.2 g/dL (ref 2.9–4.4)
Alpha 1: 0.2 g/dL (ref 0.0–0.4)
Alpha 2: 0.6 g/dL (ref 0.4–1.0)
BETA: 1.7 g/dL — AB (ref 0.7–1.3)
GAMMA GLOBULIN: 0.5 g/dL (ref 0.4–1.8)
GLOBULIN, TOTAL: 3 g/dL (ref 2.2–3.9)
M-Spike, %: 0.9 g/dL — ABNORMAL HIGH
TOTAL PROTEIN: 6.2 g/dL (ref 6.0–8.5)

## 2016-09-03 MED FILL — XARELTO 20 MG TABLET: 20 | 30 days supply | Qty: 30 | Fill #5

## 2016-09-05 ENCOUNTER — Other Ambulatory Visit: Payer: Self-pay | Admitting: *Deleted

## 2016-09-05 DIAGNOSIS — C9000 Multiple myeloma not having achieved remission: Secondary | ICD-10-CM

## 2016-09-05 MED ORDER — REVLIMID 10 MG PO CAPS: 10.0000 mg | ORAL_CAPSULE | ORAL | 0 refills | Status: DC

## 2016-09-24 ENCOUNTER — Other Ambulatory Visit (HOSPITAL_BASED_OUTPATIENT_CLINIC_OR_DEPARTMENT_OTHER): Payer: Medicare Other

## 2016-09-24 ENCOUNTER — Telehealth: Payer: Self-pay | Admitting: Oncology

## 2016-09-24 ENCOUNTER — Ambulatory Visit (HOSPITAL_BASED_OUTPATIENT_CLINIC_OR_DEPARTMENT_OTHER): Payer: Medicare Other | Admitting: Oncology

## 2016-09-24 VITALS — BP 115/55 | HR 52 | Temp 98.0°F | Resp 18 | Ht 71.0 in | Wt 176.4 lb

## 2016-09-24 DIAGNOSIS — C9000 Multiple myeloma not having achieved remission: Secondary | ICD-10-CM

## 2016-09-24 DIAGNOSIS — C61 Malignant neoplasm of prostate: Secondary | ICD-10-CM | POA: Diagnosis not present

## 2016-09-24 LAB — CBC WITH DIFFERENTIAL/PLATELET
BASO%: 1.8 % (ref 0.0–2.0)
BASOS ABS: 0 10*3/uL (ref 0.0–0.1)
EOS ABS: 0.3 10*3/uL (ref 0.0–0.5)
EOS%: 11.6 % — ABNORMAL HIGH (ref 0.0–7.0)
HCT: 39.1 % (ref 38.4–49.9)
HGB: 12.9 g/dL — ABNORMAL LOW (ref 13.0–17.1)
LYMPH%: 22.9 % (ref 14.0–49.0)
MCH: 30 pg (ref 27.2–33.4)
MCHC: 33 g/dL (ref 32.0–36.0)
MCV: 91 fL (ref 79.3–98.0)
MONO#: 0.3 10*3/uL (ref 0.1–0.9)
MONO%: 13.5 % (ref 0.0–14.0)
NEUT%: 50.2 % (ref 39.0–75.0)
NEUTROS ABS: 1.2 10*3/uL — AB (ref 1.5–6.5)
Platelets: 95 10*3/uL — ABNORMAL LOW (ref 140–400)
RBC: 4.3 10*6/uL (ref 4.20–5.82)
RDW: 15.8 % — AB (ref 11.0–14.6)
WBC: 2.5 10*3/uL — ABNORMAL LOW (ref 4.0–10.3)
lymph#: 0.6 10*3/uL — ABNORMAL LOW (ref 0.9–3.3)

## 2016-09-24 LAB — COMPREHENSIVE METABOLIC PANEL
ALT: 12 U/L (ref 0–55)
AST: 15 U/L (ref 5–34)
Albumin: 3.4 g/dL — ABNORMAL LOW (ref 3.5–5.0)
Alkaline Phosphatase: 47 U/L (ref 40–150)
Anion Gap: 7 mEq/L (ref 3–11)
BUN: 12.1 mg/dL (ref 7.0–26.0)
CHLORIDE: 107 meq/L (ref 98–109)
CO2: 29 meq/L (ref 22–29)
Calcium: 9.5 mg/dL (ref 8.4–10.4)
Creatinine: 0.9 mg/dL (ref 0.7–1.3)
EGFR: 82 mL/min/{1.73_m2} — AB (ref >90)
GLUCOSE: 80 mg/dL (ref 70–140)
POTASSIUM: 3.6 meq/L (ref 3.5–5.1)
SODIUM: 144 meq/L (ref 136–145)
Total Bilirubin: 0.78 mg/dL (ref 0.20–1.20)
Total Protein: 7.1 g/dL (ref 6.4–8.3)

## 2016-09-24 NOTE — Telephone Encounter (Signed)
Gave patient avs and calendar with upcoming appts.  °

## 2016-09-24 NOTE — Progress Notes (Signed)
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 He reports developing a rash at the face and forearms similar to the rash she had in June. He wonders with her this is related to poison ivy. The rash has resolved. Diarrhea is controlled with cholestyramine and Lomotil.  He was seen in the emergency room 08/22/2016 with pain at the right anterior costal margin. A chest CT was negative for pulmonary embolism. No acute findings. The pain has improved. He continues to have soreness at the right costal margin.  He noted a decrease in his exercise tolerance any return from vacation. He continues to swim and ride a stationary bike.  Objective:  Vital signs in last 24 hours:  Blood pressure (!) 115/55, pulse (!) 52, temperature 98 F (36.7 C), temperature source Oral, resp. rate 18, height '5\' 11"'  (1.803 m), weight 176 lb 6.4 oz (80 kg), SpO2 99 %.    HEENT: No thrush Resp: Lungs clear bilaterally Cardio: Regular rate and rhythm, 2/6 systolic murmur GI: No hepatosplenomegaly Vascular: No leg edema  Skin: Small ecchymosis at the left forearm  Musculoskeletal: Mild tenderness at the right costal margin anteriorly    Lab Results:  Lab Results  Component Value Date   WBC 2.5 (L) 09/24/2016   HGB 12.9 (L) 09/24/2016   HCT 39.1 09/24/2016   MCV 91.0 09/24/2016   PLT 95 (L) 09/24/2016   NEUTROABS 1.2 (L) 09/24/2016    CMP     Component Value Date/Time   NA 144 09/24/2016 0835   K 3.6 09/24/2016 0835   CL 104 08/22/2016 1023   CO2 29 09/24/2016 0835   GLUCOSE 80 09/24/2016 0835   BUN 12.1 09/24/2016 0835   CREATININE 0.9 09/24/2016 0835   CALCIUM 9.5 09/24/2016 0835   PROT 7.1 09/24/2016 0835   ALBUMIN 3.4 (L) 09/24/2016 0835   AST 15 09/24/2016 0835   ALT 12 09/24/2016 0835   ALKPHOS 47 09/24/2016 0835   BILITOT 0.78 09/24/2016 0835   GFRNONAA >60 08/22/2016 1011   GFRAA >60 08/22/2016 1011    08/21/2016: IgA-1479, lambda free light chains 206  Medications: I have reviewed the patient's current medications.  Assessment/Plan: 1. Multiple myeloma-confirmed on a bone marrow biopsy 08/07/2014, IgA lambda  Serum M spike and increased serum free lambda light chains  Myeloma FISH panel negative for chromosome 4, 11, 12, 13, 14, and 17 abnormalities, cytogenetics with no metaphases  Bone survey 08/15/2014 with indeterminant lucent skull lesions  Cycle 1 RVD 08/22/2014  Cycle 2 RVD 09/19/2014  Cycle 3 RVD 10/17/2014  Cycle 4 RVD 11/14/2014 (Decadron reduced to 20 mg weekly, Revlimid 15 mg days 1 through 14, Velcade 3/4 weeks)  Cycle 5 RVD 12/12/2014  Serum M spike not detected 12/26/2014  Cycle 6 RVD 01/09/2015  Maintenance Revlimid, 10 mg daily, 02/05/2015 , discontinue 02/26/2015 secondary to leg edema and diarrhea  Revlimid resumed at a dose of 10 mg every other day beginning 03/13/2015  PET scan 83/66/2947-ML hypermetabolic bone lesions, few lucent lesions in the spine and pelvis  2. 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 History of intermittent prostatitis  4. Skin rash 10/24/2014-referred to dermatology, biopsy consistent with local hypersensitivity reaction  5. 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 Left lower leg cellulitis 06/24/2015-treated with Keflex  7. Mild thrombocytopenia secondary to Revlimid versus myeloma  8. Mild neutropenia-likely secondary to Revlimid versus myeloma  9. Fall with a skull fracture and subarachnoid blood/right frontal lobe contusions 10/17/2015  10. Right lower lobe pulmonary embolus 03/28/2016. Lovenox ,  transitioned to Xarelto  11. Vesicular rash June 2018-potentially related to the zoster vaccine, resolved   Disposition:  He appears stable. The CBC is unchanged. We will follow-up on the IgA and lambda light chain from today.  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 He will return for an office visit here in 6 weeks. The plan is to begin daratumumab-based therapy if there is clinical evidence of disease progression.  Donneta Romberg, MD  09/24/2016  9:49 AM

## 2016-09-25 LAB — KAPPA/LAMBDA LIGHT CHAINS
Ig Kappa Free Light Chain: 8.8 mg/L (ref 3.3–19.4)
Ig Lambda Free Light Chain: 261.5 mg/L — ABNORMAL HIGH (ref 5.7–26.3)
Kappa/Lambda FluidC Ratio: 0.03 — ABNORMAL LOW (ref 0.26–1.65)

## 2016-09-25 LAB — IGA: IgA, Qn, Serum: 1624 mg/dL — ABNORMAL HIGH (ref 61–437)

## 2016-09-26 LAB — PROTEIN ELECTROPHORESIS, SERUM
A/G RATIO SPE: 1.1 (ref 0.7–1.7)
ALBUMIN: 3.4 g/dL (ref 2.9–4.4)
ALPHA 1: 0.2 g/dL (ref 0.0–0.4)
Alpha 2: 0.6 g/dL (ref 0.4–1.0)
BETA: 1.8 g/dL — AB (ref 0.7–1.3)
GAMMA GLOBULIN: 0.5 g/dL (ref 0.4–1.8)
Globulin, Total: 3.1 g/dL (ref 2.2–3.9)
M-Spike, %: 0.9 g/dL — ABNORMAL HIGH
TOTAL PROTEIN: 6.5 g/dL (ref 6.0–8.5)

## 2016-10-03 ENCOUNTER — Other Ambulatory Visit: Payer: Self-pay | Admitting: Nurse Practitioner

## 2016-10-03 DIAGNOSIS — I2699 Other pulmonary embolism without acute cor pulmonale: Secondary | ICD-10-CM

## 2016-10-03 MED FILL — XARELTO 20 MG TABLET: 20 | 30 days supply | Qty: 30 | Fill #0

## 2016-10-06 ENCOUNTER — Other Ambulatory Visit: Payer: Self-pay | Admitting: Oncology

## 2016-10-06 DIAGNOSIS — C9000 Multiple myeloma not having achieved remission: Secondary | ICD-10-CM

## 2016-10-08 DIAGNOSIS — D61810 Antineoplastic chemotherapy induced pancytopenia: Secondary | ICD-10-CM | POA: Diagnosis not present

## 2016-10-08 DIAGNOSIS — Z7901 Long term (current) use of anticoagulants: Secondary | ICD-10-CM | POA: Diagnosis not present

## 2016-10-08 DIAGNOSIS — R197 Diarrhea, unspecified: Secondary | ICD-10-CM | POA: Diagnosis not present

## 2016-10-08 DIAGNOSIS — C9000 Multiple myeloma not having achieved remission: Secondary | ICD-10-CM | POA: Diagnosis not present

## 2016-10-08 DIAGNOSIS — Z6824 Body mass index (BMI) 24.0-24.9, adult: Secondary | ICD-10-CM | POA: Diagnosis not present

## 2016-10-08 DIAGNOSIS — D61818 Other pancytopenia: Secondary | ICD-10-CM | POA: Diagnosis not present

## 2016-10-08 DIAGNOSIS — C9002 Multiple myeloma in relapse: Secondary | ICD-10-CM | POA: Diagnosis not present

## 2016-10-08 DIAGNOSIS — I2699 Other pulmonary embolism without acute cor pulmonale: Secondary | ICD-10-CM | POA: Diagnosis not present

## 2016-10-30 MED FILL — XARELTO 20 MG TABLET: 20 | 30 days supply | Qty: 30 | Fill #1

## 2016-10-31 ENCOUNTER — Other Ambulatory Visit: Payer: Self-pay | Admitting: Oncology

## 2016-10-31 DIAGNOSIS — C9000 Multiple myeloma not having achieved remission: Secondary | ICD-10-CM

## 2016-11-10 ENCOUNTER — Telehealth: Payer: Self-pay | Admitting: Oncology

## 2016-11-10 ENCOUNTER — Other Ambulatory Visit (HOSPITAL_BASED_OUTPATIENT_CLINIC_OR_DEPARTMENT_OTHER): Payer: Medicare Other

## 2016-11-10 ENCOUNTER — Ambulatory Visit (HOSPITAL_BASED_OUTPATIENT_CLINIC_OR_DEPARTMENT_OTHER): Payer: Medicare Other | Admitting: Oncology

## 2016-11-10 VITALS — BP 128/68 | HR 53 | Temp 97.9°F | Resp 18 | Ht 71.0 in | Wt 175.4 lb

## 2016-11-10 DIAGNOSIS — D701 Agranulocytosis secondary to cancer chemotherapy: Secondary | ICD-10-CM

## 2016-11-10 DIAGNOSIS — I2699 Other pulmonary embolism without acute cor pulmonale: Secondary | ICD-10-CM

## 2016-11-10 DIAGNOSIS — Z7901 Long term (current) use of anticoagulants: Secondary | ICD-10-CM

## 2016-11-10 DIAGNOSIS — Z23 Encounter for immunization: Secondary | ICD-10-CM | POA: Diagnosis not present

## 2016-11-10 DIAGNOSIS — C9000 Multiple myeloma not having achieved remission: Secondary | ICD-10-CM | POA: Diagnosis not present

## 2016-11-10 DIAGNOSIS — D6959 Other secondary thrombocytopenia: Secondary | ICD-10-CM

## 2016-11-10 LAB — COMPREHENSIVE METABOLIC PANEL
ALK PHOS: 44 U/L (ref 40–150)
ALT: 9 U/L (ref 0–55)
AST: 13 U/L (ref 5–34)
Albumin: 3.3 g/dL — ABNORMAL LOW (ref 3.5–5.0)
Anion Gap: 10 mEq/L (ref 3–11)
BILIRUBIN TOTAL: 0.74 mg/dL (ref 0.20–1.20)
BUN: 10.4 mg/dL (ref 7.0–26.0)
CALCIUM: 9.1 mg/dL (ref 8.4–10.4)
CO2: 27 mEq/L (ref 22–29)
Chloride: 105 mEq/L (ref 98–109)
Creatinine: 0.9 mg/dL (ref 0.7–1.3)
GLUCOSE: 102 mg/dL (ref 70–140)
POTASSIUM: 3.8 meq/L (ref 3.5–5.1)
Sodium: 142 mEq/L (ref 136–145)
TOTAL PROTEIN: 7 g/dL (ref 6.4–8.3)

## 2016-11-10 LAB — CBC WITH DIFFERENTIAL/PLATELET
BASO%: 1.4 % (ref 0.0–2.0)
Basophils Absolute: 0 10*3/uL (ref 0.0–0.1)
EOS%: 9.3 % — AB (ref 0.0–7.0)
Eosinophils Absolute: 0.2 10*3/uL (ref 0.0–0.5)
HCT: 37.6 % — ABNORMAL LOW (ref 38.4–49.9)
HGB: 12.4 g/dL — ABNORMAL LOW (ref 13.0–17.1)
LYMPH%: 21.5 % (ref 14.0–49.0)
MCH: 30.2 pg (ref 27.2–33.4)
MCHC: 33 g/dL (ref 32.0–36.0)
MCV: 91.5 fL (ref 79.3–98.0)
MONO#: 0.3 10*3/uL (ref 0.1–0.9)
MONO%: 13 % (ref 0.0–14.0)
NEUT#: 1.2 10*3/uL — ABNORMAL LOW (ref 1.5–6.5)
NEUT%: 54.8 % (ref 39.0–75.0)
PLATELETS: 77 10*3/uL — AB (ref 140–400)
RBC: 4.11 10*6/uL — AB (ref 4.20–5.82)
RDW: 15.7 % — ABNORMAL HIGH (ref 11.0–14.6)
WBC: 2.2 10*3/uL — ABNORMAL LOW (ref 4.0–10.3)
lymph#: 0.5 10*3/uL — ABNORMAL LOW (ref 0.9–3.3)

## 2016-11-10 MED ORDER — INFLUENZA VAC SPLIT HIGH-DOSE 0.5 ML IM SUSY
0.5000 mL | PREFILLED_SYRINGE | Freq: Once | INTRAMUSCULAR | Status: AC
  Administered 2016-11-10: 0.5 mL via INTRAMUSCULAR
  Filled 2016-11-10: qty 0.5

## 2016-11-10 NOTE — Progress Notes (Signed)
  [name]   Diagnosis: multiple myeloma  INTERVAL HISTORY:   Mitchell Lowery returns as scheduled. He feels well.No infection or bleeding. He saw Dr. Amalia Hailey on 10/08/2016. He is considering enrollment on a clinical trial at [facility] with pomolidamide/Decadron with a randomization to ixazomib.  He is exercising.he continues to have intermittent diarrhea.  Objective:  Vital signs in last 24 hours:  Blood pressure 128/68, pulse (!) 53, temperature 97.9 F (36.6 C), temperature source Oral, resp. rate 18, height '5\' 11"'$  (1.803 m), weight 175 lb 6.4 oz (79.6 kg), SpO2 98 %.    HEENT: no thrush or ulcers Resp: lungs clear bilaterally Cardio: regular rate and rhythm, 2/6 systolic murmur GI: no hepatosplenomegaly Vascular: the left lower leg is slightly larger than the right side, no edema   Lab Results:  Lab Results  Component Value Date   WBC 2.2 (L) 11/10/2016   HGB 12.4 (L) 11/10/2016   HCT 37.6 (L) 11/10/2016   MCV 91.5 11/10/2016   PLT 77 (L) 11/10/2016   NEUTROABS 1.2 (L) 11/10/2016    [facility] 10/08/2016: IgA 1934, lambda free light chains 25.62  Medications: I have reviewed the patient's current medications.  Assessment/Plan: 1. Multiple myeloma-confirmed on a bone marrow biopsy 08/07/2014, IgA lambda  Serum M spike and increased serum free lambda light chains  Myeloma FISH panel negative for chromosome 4, 11, 12, 13, 14, and 17 abnormalities, cytogenetics with no metaphases  Bone survey 08/15/2014 with indeterminant lucent skull lesions  Cycle 1 RVD 08/22/2014  Cycle 2 RVD 09/19/2014  Cycle 3 RVD 10/17/2014  Cycle 4 RVD 11/14/2014 (Decadron reduced to 20 mg weekly, Revlimid 15 mg days 1 through 14, Velcade 3/4 weeks)  Cycle 5 RVD 12/12/2014  Serum M spike not detected 12/26/2014  Cycle 6 RVD 01/09/2015  Maintenance Revlimid, 10 mg daily, 02/05/2015 , discontinue 02/26/2015 secondary to leg edema and diarrhea  Revlimid  resumed at a dose of 10 mg every other day beginning 03/13/2015  PET scan 17/61/6073-XT hypermetabolic bone lesions, few lucent lesions in the spine and pelvis  2. 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 History of intermittent prostatitis  4. Skin rash 10/24/2014-referred to dermatology, biopsy consistent with local hypersensitivity reaction  5. 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 Left lower leg cellulitis 06/24/2015-treated with Keflex  7. Mild thrombocytopenia secondary to Revlimid versus myeloma  8. Mild neutropenia-likely secondary to Revlimid versus myeloma  9. Fall with a skull fracture and subarachnoid blood/right frontal lobe contusions 10/17/2015  10. Right lower lobe pulmonary embolus 03/28/2016. Lovenox , transitioned to Xarelto  11. Vesicular rashJune 2018-potentially related to the zoster vaccine, resolved   Disposition:  Mitchell Lowery appears stable. He continues Revlimid every other day. The IgA level and lambda light chains are slowly rising. The platelet count is lower today. He understands that we will likely recommend changing to a multiagent drug regimen within the next few months. He is considering enrollment on the [facility] clinical trial versus off protocol treatment with Velcade/Decadron/daratumumab here.  We will follow-up on the IgA level and light chains from today. He will return for an office and lab visit in 4 weeks.  Mitchell Lowery received an influenza vaccine today.  Donneta Romberg, MD  11/10/2016  8:14 AM

## 2016-11-10 NOTE — Telephone Encounter (Signed)
Gave avs and calendar for November  °

## 2016-11-11 LAB — IGA: IgA, Qn, Serum: 1746 mg/dL — ABNORMAL HIGH (ref 61–437)

## 2016-11-11 LAB — KAPPA/LAMBDA LIGHT CHAINS
IG KAPPA FREE LIGHT CHAIN: 8.1 mg/L (ref 3.3–19.4)
IG LAMBDA FREE LIGHT CHAIN: 282.2 mg/L — AB (ref 5.7–26.3)
KAPPA/LAMBDA FLC RATIO: 0.03 — AB (ref 0.26–1.65)

## 2016-11-25 DIAGNOSIS — C61 Malignant neoplasm of prostate: Secondary | ICD-10-CM | POA: Diagnosis not present

## 2016-11-25 DIAGNOSIS — N400 Enlarged prostate without lower urinary tract symptoms: Secondary | ICD-10-CM | POA: Diagnosis not present

## 2016-11-25 DIAGNOSIS — D649 Anemia, unspecified: Secondary | ICD-10-CM | POA: Diagnosis not present

## 2016-11-25 DIAGNOSIS — I10 Essential (primary) hypertension: Secondary | ICD-10-CM | POA: Diagnosis not present

## 2016-11-25 DIAGNOSIS — D61818 Other pancytopenia: Secondary | ICD-10-CM | POA: Diagnosis not present

## 2016-12-01 ENCOUNTER — Other Ambulatory Visit: Payer: Self-pay | Admitting: Oncology

## 2016-12-01 DIAGNOSIS — C9000 Multiple myeloma not having achieved remission: Secondary | ICD-10-CM

## 2016-12-01 MED FILL — XARELTO 20 MG TABLET: 20 | 30 days supply | Qty: 30 | Fill #2

## 2016-12-08 ENCOUNTER — Telehealth: Payer: Self-pay | Admitting: Oncology

## 2016-12-08 ENCOUNTER — Ambulatory Visit (HOSPITAL_BASED_OUTPATIENT_CLINIC_OR_DEPARTMENT_OTHER): Payer: Medicare Other | Admitting: Oncology

## 2016-12-08 ENCOUNTER — Other Ambulatory Visit (HOSPITAL_BASED_OUTPATIENT_CLINIC_OR_DEPARTMENT_OTHER): Payer: Medicare Other

## 2016-12-08 VITALS — BP 127/46 | HR 46 | Temp 98.5°F | Resp 18 | Ht 71.0 in | Wt 178.3 lb

## 2016-12-08 DIAGNOSIS — D6959 Other secondary thrombocytopenia: Secondary | ICD-10-CM

## 2016-12-08 DIAGNOSIS — C61 Malignant neoplasm of prostate: Secondary | ICD-10-CM | POA: Diagnosis not present

## 2016-12-08 DIAGNOSIS — I2699 Other pulmonary embolism without acute cor pulmonale: Secondary | ICD-10-CM

## 2016-12-08 DIAGNOSIS — Z7901 Long term (current) use of anticoagulants: Secondary | ICD-10-CM | POA: Diagnosis not present

## 2016-12-08 DIAGNOSIS — C9000 Multiple myeloma not having achieved remission: Secondary | ICD-10-CM

## 2016-12-08 DIAGNOSIS — D709 Neutropenia, unspecified: Secondary | ICD-10-CM

## 2016-12-08 LAB — CBC WITH DIFFERENTIAL/PLATELET
BASO%: 1.1 % (ref 0.0–2.0)
BASOS ABS: 0 10*3/uL (ref 0.0–0.1)
EOS ABS: 0.2 10*3/uL (ref 0.0–0.5)
EOS%: 10.6 % — ABNORMAL HIGH (ref 0.0–7.0)
HEMATOCRIT: 38.3 % — AB (ref 38.4–49.9)
HGB: 12.3 g/dL — ABNORMAL LOW (ref 13.0–17.1)
LYMPH#: 0.5 10*3/uL — AB (ref 0.9–3.3)
LYMPH%: 23.8 % (ref 14.0–49.0)
MCH: 30.3 pg (ref 27.2–33.4)
MCHC: 32.1 g/dL (ref 32.0–36.0)
MCV: 94.3 fL (ref 79.3–98.0)
MONO#: 0.3 10*3/uL (ref 0.1–0.9)
MONO%: 14.8 % — ABNORMAL HIGH (ref 0.0–14.0)
NEUT#: 0.9 10*3/uL — ABNORMAL LOW (ref 1.5–6.5)
NEUT%: 49.7 % (ref 39.0–75.0)
PLATELETS: 80 10*3/uL — AB (ref 140–400)
RBC: 4.06 10*6/uL — ABNORMAL LOW (ref 4.20–5.82)
RDW: 14.7 % — ABNORMAL HIGH (ref 11.0–14.6)
WBC: 1.9 10*3/uL — ABNORMAL LOW (ref 4.0–10.3)

## 2016-12-08 LAB — COMPREHENSIVE METABOLIC PANEL
ALT: 12 U/L (ref 0–55)
ANION GAP: 8 meq/L (ref 3–11)
AST: 14 U/L (ref 5–34)
Albumin: 3.4 g/dL — ABNORMAL LOW (ref 3.5–5.0)
Alkaline Phosphatase: 43 U/L (ref 40–150)
BUN: 12.4 mg/dL (ref 7.0–26.0)
CO2: 29 meq/L (ref 22–29)
Calcium: 9.3 mg/dL (ref 8.4–10.4)
Chloride: 106 mEq/L (ref 98–109)
Creatinine: 0.9 mg/dL (ref 0.7–1.3)
EGFR: >60 mL/min/{1.73_m2} (ref >60)
GLUCOSE: 73 mg/dL (ref 70–140)
Potassium: 3.8 mEq/L (ref 3.5–5.1)
Sodium: 143 mEq/L (ref 136–145)
TOTAL PROTEIN: 7.5 g/dL (ref 6.4–8.3)
Total Bilirubin: 0.69 mg/dL (ref 0.20–1.20)

## 2016-12-08 NOTE — Telephone Encounter (Signed)
Scheduled appt per 11/12 los - Gave patient AVS and calender per los.  

## 2016-12-08 NOTE — Progress Notes (Signed)
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  He has mild diarrhea.  The lesion at the left lower leg has not healed and bleeds intermittently.  He was prescribed a topical therapy by Dr. Inda Merlin.  He is exercising.  No other complaint.  Objective:  Vital signs in last 24 hours:  Blood pressure (!) 127/46, pulse (!) 46, temperature 98.5 F (36.9 C), temperature source Oral, resp. rate 18, height 5' 11" (1.803 m), weight 178 lb 4.8 oz (80.9 kg), SpO2 100 %.    HEENT: No thrush or ulcers Resp: Lungs clear bilaterally Cardio: Regular rate and rhythm GI: No hepatosplenomegaly, nontender Vascular: No leg edema  Skin: Superficial flesh-colored lesion at the lateral left lower leg with mild bleeding onto the covering bandage    Lab Results:  Lab Results  Component Value Date   WBC 1.9 (L) 12/08/2016   HGB 12.3 (L) 12/08/2016   HCT 38.3 (L) 12/08/2016   MCV 94.3 12/08/2016   PLT 80 (L) 12/08/2016   NEUTROABS 0.9 (L) 12/08/2016    CMP     Component Value Date/Time   NA 143 12/08/2016 0919   K 3.8 12/08/2016 0919   CL 104 08/22/2016 1023   CO2 29 12/08/2016 0919   GLUCOSE 73 12/08/2016 0919   BUN 12.4 12/08/2016 0919   CREATININE 0.9 12/08/2016 0919   CALCIUM 9.3 12/08/2016 0919   PROT 7.5 12/08/2016 0919   ALBUMIN 3.4 (L) 12/08/2016 0919   AST 14 12/08/2016 0919   ALT 12 12/08/2016 0919   ALKPHOS 43 12/08/2016 0919   BILITOT 0.69 12/08/2016 0919   GFRNONAA >60 08/22/2016 1011   GFRAA >60 08/22/2016 1011   11/10/2016: IgA 1746, lambda free light chains 282 Medications: I have reviewed the patient's current medications.  Assessment/Plan: 1. Multiple myeloma-confirmed on a bone marrow biopsy 08/07/2014, IgA lambda  Serum M spike and increased serum free lambda light chains  Myeloma FISH panel negative for chromosome 4, 11, 12, 13, 14, and 17  abnormalities, cytogenetics with no metaphases  Bone survey 08/15/2014 with indeterminant lucent skull lesions  Cycle 1 RVD 08/22/2014  Cycle 2 RVD 09/19/2014  Cycle 3 RVD 10/17/2014  Cycle 4 RVD 11/14/2014 (Decadron reduced to 20 mg weekly, Revlimid 15 mg days 1 through 14, Velcade 3/4 weeks)  Cycle 5 RVD 12/12/2014  Serum M spike not detected 12/26/2014  Cycle 6 RVD 01/09/2015  Maintenance Revlimid, 10 mg daily, 02/05/2015 , discontinue 02/26/2015 secondary to leg edema and diarrhea  Revlimid resumed at a dose of 10 mg every other day beginning 03/13/2015  PET scan 74/16/3845-XM hypermetabolic bone lesions, few lucent lesions in the spine and pelvis  2. 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 History of intermittent prostatitis  4. Skin rash 10/24/2014-referred to dermatology, biopsy consistent with local hypersensitivity reaction  5. 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 Left lower leg cellulitis 06/24/2015-treated with Keflex  7. Mild thrombocytopenia secondary to Revlimid versus myeloma  8. Mild neutropenia-likely secondary to Revlimid versus myeloma  9. Fall with a skull fracture and subarachnoid blood/right frontal lobe contusions 10/17/2015  10. Right lower lobe pulmonary embolus 03/28/2016. Lovenox , transitioned to Xarelto  11. 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  He continues Revlimid.  The myeloma appears to be slowly progressing.  He is asymptomatic.  He is contemplating enrollment on a clinical trial at [facility] with Pomalidomide/Decadron and a Proteozome inhibitor.  I will contact Dr.  Amalia Hailey to discuss treatment on the clinical trial versus off protocol therapy.  The lesion at the left lower leg is in an area of previous trauma.  He will be referred to dermatology if this lesion does not heal over the next few months.  He is at risk for developing a skin cancer while on Revlimid.  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    [name], MD  12/08/2016  10:22 AM

## 2016-12-09 LAB — KAPPA/LAMBDA LIGHT CHAINS
IG KAPPA FREE LIGHT CHAIN: 9 mg/L (ref 3.3–19.4)
IG LAMBDA FREE LIGHT CHAIN: 308.3 mg/L — AB (ref 5.7–26.3)
Kappa/Lambda FluidC Ratio: 0.03 — ABNORMAL LOW (ref 0.26–1.65)

## 2016-12-09 LAB — IGA

## 2016-12-23 DIAGNOSIS — Z8546 Personal history of malignant neoplasm of prostate: Secondary | ICD-10-CM | POA: Diagnosis not present

## 2016-12-26 ENCOUNTER — Other Ambulatory Visit: Payer: Self-pay | Admitting: Oncology

## 2016-12-26 DIAGNOSIS — C9000 Multiple myeloma not having achieved remission: Secondary | ICD-10-CM

## 2016-12-31 DIAGNOSIS — N5201 Erectile dysfunction due to arterial insufficiency: Secondary | ICD-10-CM | POA: Diagnosis not present

## 2016-12-31 DIAGNOSIS — N400 Enlarged prostate without lower urinary tract symptoms: Secondary | ICD-10-CM | POA: Diagnosis not present

## 2016-12-31 DIAGNOSIS — Z8546 Personal history of malignant neoplasm of prostate: Secondary | ICD-10-CM | POA: Diagnosis not present

## 2017-01-01 MED FILL — XARELTO 20 MG TABLET: 20 | 30 days supply | Qty: 30 | Fill #3

## 2017-01-07 ENCOUNTER — Other Ambulatory Visit (HOSPITAL_BASED_OUTPATIENT_CLINIC_OR_DEPARTMENT_OTHER): Payer: Medicare Other

## 2017-01-07 ENCOUNTER — Ambulatory Visit (HOSPITAL_BASED_OUTPATIENT_CLINIC_OR_DEPARTMENT_OTHER): Payer: Medicare Other | Admitting: Oncology

## 2017-01-07 VITALS — BP 130/66 | HR 59 | Temp 98.0°F | Resp 18 | Ht 71.0 in | Wt 178.4 lb

## 2017-01-07 DIAGNOSIS — C61 Malignant neoplasm of prostate: Secondary | ICD-10-CM | POA: Diagnosis not present

## 2017-01-07 DIAGNOSIS — D709 Neutropenia, unspecified: Secondary | ICD-10-CM | POA: Diagnosis not present

## 2017-01-07 DIAGNOSIS — C9000 Multiple myeloma not having achieved remission: Secondary | ICD-10-CM | POA: Diagnosis not present

## 2017-01-07 DIAGNOSIS — D6959 Other secondary thrombocytopenia: Secondary | ICD-10-CM

## 2017-01-07 LAB — CBC WITH DIFFERENTIAL/PLATELET
BASO%: 1.6 % (ref 0.0–2.0)
Basophils Absolute: 0 10*3/uL (ref 0.0–0.1)
EOS%: 8.2 % — ABNORMAL HIGH (ref 0.0–7.0)
Eosinophils Absolute: 0.1 10*3/uL (ref 0.0–0.5)
HCT: 36.5 % — ABNORMAL LOW (ref 38.4–49.9)
HGB: 11.8 g/dL — ABNORMAL LOW (ref 13.0–17.1)
LYMPH#: 0.4 10*3/uL — AB (ref 0.9–3.3)
LYMPH%: 23.7 % (ref 14.0–49.0)
MCH: 30.2 pg (ref 27.2–33.4)
MCHC: 32.4 g/dL (ref 32.0–36.0)
MCV: 93.3 fL (ref 79.3–98.0)
MONO#: 0.3 10*3/uL (ref 0.1–0.9)
MONO%: 15.4 % — ABNORMAL HIGH (ref 0.0–14.0)
NEUT#: 0.9 10*3/uL — ABNORMAL LOW (ref 1.5–6.5)
NEUT%: 51.1 % (ref 39.0–75.0)
PLATELETS: 79 10*3/uL — AB (ref 140–400)
RBC: 3.91 10*6/uL — AB (ref 4.20–5.82)
RDW: 15.9 % — ABNORMAL HIGH (ref 11.0–14.6)
WBC: 1.8 10*3/uL — ABNORMAL LOW (ref 4.0–10.3)

## 2017-01-07 LAB — COMPREHENSIVE METABOLIC PANEL
ALBUMIN: 3.6 g/dL (ref 3.5–5.0)
ALK PHOS: 40 U/L (ref 40–150)
ALT: 13 U/L (ref 0–55)
AST: 16 U/L (ref 5–34)
Anion Gap: 8 mEq/L (ref 3–11)
BILIRUBIN TOTAL: 0.65 mg/dL (ref 0.20–1.20)
BUN: 12.3 mg/dL (ref 7.0–26.0)
CALCIUM: 9.1 mg/dL (ref 8.4–10.4)
CO2: 27 mEq/L (ref 22–29)
Chloride: 107 mEq/L (ref 98–109)
Creatinine: 0.9 mg/dL (ref 0.7–1.3)
Glucose: 86 mg/dl (ref 70–140)
POTASSIUM: 3.9 meq/L (ref 3.5–5.1)
Sodium: 142 mEq/L (ref 136–145)
Total Protein: 7.5 g/dL (ref 6.4–8.3)

## 2017-01-07 NOTE — Progress Notes (Signed)
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  He continues Revlimid.  He continues to have intermittent diarrhea.  No fever.  The left leg lesion has healed.  He is scheduled for an appointment at [facility] 02/05/2016.  He has intermittent pain at the right anterolateral chest.  Objective:  Vital signs in last 24 hours:  Blood pressure 130/66, pulse (!) 59, temperature 98 F (36.7 C), temperature source Oral, resp. rate 18, height _0  (1.803 m), weight 178 lb 6.4 oz (80.9 kg), SpO2 96 %.    HEENT: No thrush or ulcers Resp: Lungs clear bilaterally Cardio: Regular rate and rhythm, 2/6 systolic murmur GI: No hepatosplenomegaly, nontender Vascular: No leg edema  Skin: Left lower leg lesion has almost completely healed    Lab Results:  Lab Results  Component Value Date   WBC 1.8 (L) 01/07/2017   HGB 11.8 (L) 01/07/2017   HCT 36.5 (L) 01/07/2017   MCV 93.3 01/07/2017   PLT 79 (L) 01/07/2017   NEUTROABS 0.9 (L) 01/07/2017    CMP     Component Value Date/Time   NA 142 01/07/2017 0902   K 3.9 01/07/2017 0902   CL 104 08/22/2016 1023   CO2 27 01/07/2017 0902   GLUCOSE 86 01/07/2017 0902   BUN 12.3 01/07/2017 0902   CREATININE 0.9 01/07/2017 0902   CALCIUM 9.1 01/07/2017 0902   PROT 7.5 01/07/2017 0902   ALBUMIN 3.6 01/07/2017 0902   AST 16 01/07/2017 0902   ALT 13 01/07/2017 0902   ALKPHOS 40 01/07/2017 0902   BILITOT 0.65 01/07/2017 0902   GFRNONAA >60 08/22/2016 1011   GFRAA >60 08/22/2016 1011    12/08/2016: IgA 1856, lambda light chains 308 Medications: I have reviewed the patient's current medications.  Assessment/Plan: 1. Multiple myeloma-confirmed on a bone marrow biopsy 08/07/2014, IgA lambda  Serum M spike and increased serum free lambda light chains  Myeloma FISH panel negative for chromosome 4, 11, 12, 13, 14, and 17 abnormalities, cytogenetics with no  metaphases  Bone survey 08/15/2014 with indeterminant lucent skull lesions  Cycle 1 RVD 08/22/2014  Cycle 2 RVD 09/19/2014  Cycle 3 RVD 10/17/2014  Cycle 4 RVD 11/14/2014 (Decadron reduced to 20 mg weekly, Revlimid 15 mg days 1 through 14, Velcade 3/4 weeks)  Cycle 5 RVD 12/12/2014  Serum M spike not detected 12/26/2014  Cycle 6 RVD 01/09/2015  Maintenance Revlimid, 10 mg daily, 02/05/2015 , discontinue 02/26/2015 secondary to leg edema and diarrhea  Revlimid resumed at a dose of 10 mg every other day beginning 03/13/2015  PET scan 45/80/9983-JA hypermetabolic bone lesions, few lucent lesions in the spine and pelvis  2. 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 History of intermittent prostatitis  4. Skin rash 10/24/2014-referred to dermatology, biopsy consistent with local hypersensitivity reaction  5. 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 Left lower leg cellulitis 06/24/2015-treated with Keflex  7. Mild thrombocytopenia secondary to Revlimid versus myeloma  8. Mild neutropenia-likely secondary to Revlimid versus myeloma  9. Fall with a skull fracture and subarachnoid blood/right frontal lobe contusions 10/17/2015  10. Right lower lobe pulmonary embolus 03/28/2016. Lovenox , transitioned to Xarelto  11. 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  He has mild anemia and the IgA/lambda light chains are slowly rising.  He will continue Revlimid.  He will contact the [facility] staff for direction regarding discontinuation of the Revlimid prior to beginning the clinical trial.  He has moderate neutropenia,  likely secondary to myeloma and Revlimid.  He will seek medical attention for a fever or symptoms of an infection.  He is scheduled to see Dr. Amalia Hailey 02/05/2016.  He will return for an office visit here 02/19/2016.   [name], MD  01/07/2017  10:33 AM

## 2017-01-08 ENCOUNTER — Other Ambulatory Visit: Payer: Self-pay | Admitting: Oncology

## 2017-01-08 DIAGNOSIS — C9000 Multiple myeloma not having achieved remission: Secondary | ICD-10-CM

## 2017-01-08 LAB — KAPPA/LAMBDA LIGHT CHAINS
IG KAPPA FREE LIGHT CHAIN: 8.2 mg/L (ref 3.3–19.4)
IG LAMBDA FREE LIGHT CHAIN: 308.8 mg/L — AB (ref 5.7–26.3)
KAPPA/LAMBDA FLC RATIO: 0.03 — AB (ref 0.26–1.65)

## 2017-01-08 LAB — IGA: IgA, Qn, Serum: 2045 mg/dL — ABNORMAL HIGH (ref 61–437)

## 2017-01-09 LAB — PROTEIN ELECTROPHORESIS, SERUM
A/G Ratio: 1 (ref 0.7–1.7)
ALBUMIN: 3.3 g/dL (ref 2.9–4.4)
ALPHA 1: 0.2 g/dL (ref 0.0–0.4)
Alpha 2: 0.6 g/dL (ref 0.4–1.0)
Beta: 2.1 g/dL — ABNORMAL HIGH (ref 0.7–1.3)
GAMMA GLOBULIN: 0.5 g/dL (ref 0.4–1.8)
Globulin, Total: 3.4 g/dL (ref 2.2–3.9)
M-Spike, %: 1.2 g/dL — ABNORMAL HIGH
TOTAL PROTEIN: 6.7 g/dL (ref 6.0–8.5)

## 2017-01-31 MED FILL — XARELTO 20 MG TABLET: 20 | 30 days supply | Qty: 30 | Fill #4

## 2017-02-04 ENCOUNTER — Telehealth: Payer: Self-pay | Admitting: Emergency Medicine

## 2017-02-04 ENCOUNTER — Telehealth: Payer: Self-pay

## 2017-02-04 DIAGNOSIS — D801 Nonfamilial hypogammaglobulinemia: Secondary | ICD-10-CM | POA: Insufficient documentation

## 2017-02-04 DIAGNOSIS — Z6825 Body mass index (BMI) 25.0-25.9, adult: Secondary | ICD-10-CM | POA: Diagnosis not present

## 2017-02-04 DIAGNOSIS — C9000 Multiple myeloma not having achieved remission: Secondary | ICD-10-CM | POA: Diagnosis not present

## 2017-02-04 DIAGNOSIS — D61810 Antineoplastic chemotherapy induced pancytopenia: Secondary | ICD-10-CM | POA: Diagnosis not present

## 2017-02-04 DIAGNOSIS — C9002 Multiple myeloma in relapse: Secondary | ICD-10-CM | POA: Diagnosis not present

## 2017-02-04 DIAGNOSIS — Z79899 Other long term (current) drug therapy: Secondary | ICD-10-CM | POA: Diagnosis not present

## 2017-02-04 DIAGNOSIS — Z7901 Long term (current) use of anticoagulants: Secondary | ICD-10-CM | POA: Diagnosis not present

## 2017-02-04 DIAGNOSIS — D61818 Other pancytopenia: Secondary | ICD-10-CM | POA: Diagnosis not present

## 2017-02-04 DIAGNOSIS — I2699 Other pulmonary embolism without acute cor pulmonale: Secondary | ICD-10-CM | POA: Diagnosis not present

## 2017-02-04 NOTE — Telephone Encounter (Signed)
Called patient to inform him Dr.Sherrill will not refill his revlimid d/t pt starting clinical trial. VM left for patient to call back regarding this.

## 2017-02-04 NOTE — Telephone Encounter (Signed)
Informed patient of message from [facility] below. Voiced understanding.

## 2017-02-18 ENCOUNTER — Inpatient Hospital Stay: Payer: Medicare Other | Attending: Oncology | Admitting: Oncology

## 2017-02-18 ENCOUNTER — Telehealth: Payer: Self-pay | Admitting: Oncology

## 2017-02-18 VITALS — BP 147/56 | HR 57 | Temp 98.4°F | Resp 18 | Ht 71.0 in | Wt 179.4 lb

## 2017-02-18 DIAGNOSIS — Z923 Personal history of irradiation: Secondary | ICD-10-CM | POA: Diagnosis not present

## 2017-02-18 DIAGNOSIS — D6959 Other secondary thrombocytopenia: Secondary | ICD-10-CM | POA: Diagnosis not present

## 2017-02-18 DIAGNOSIS — L03116 Cellulitis of left lower limb: Secondary | ICD-10-CM | POA: Diagnosis not present

## 2017-02-18 DIAGNOSIS — C9000 Multiple myeloma not having achieved remission: Secondary | ICD-10-CM | POA: Insufficient documentation

## 2017-02-18 DIAGNOSIS — Z86711 Personal history of pulmonary embolism: Secondary | ICD-10-CM | POA: Diagnosis not present

## 2017-02-18 DIAGNOSIS — Z79899 Other long term (current) drug therapy: Secondary | ICD-10-CM

## 2017-02-18 NOTE — Progress Notes (Signed)
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  He has a cough and rhinorrhea.  His symptoms are improving.  He reports increased malaise.  He saw Dr. Amalia Hailey and is planning to enroll on a clinical trial at [facility].  He continues anticoagulation.  No bleeding.  Objective:  Vital signs in last 24 hours:  Blood pressure (!) 147/56, pulse (!) 57, temperature 98.4 F (36.9 C), temperature source Oral, resp. rate 18, height '5\' 11"'  (1.803 m), weight 179 lb 6.4 oz (81.4 kg), SpO2 98 %.    Resp: Lungs clear bilaterally, no respiratory distress Cardio: Regular rate and rhythm with premature beats, 2/6 systolic murmur GI: No hepatosplenomegaly Vascular: No leg edema   Lab Results:  Lab Results  Component Value Date   WBC 1.8 (L) 01/07/2017   HGB 11.8 (L) 01/07/2017   HCT 36.5 (L) 01/07/2017   MCV 93.3 01/07/2017   PLT 79 (L) 01/07/2017   NEUTROABS 0.9 (L) 01/07/2017    CMP     Component Value Date/Time   NA 142 01/07/2017 0902   K 3.9 01/07/2017 0902   CL 104 08/22/2016 1023   CO2 27 01/07/2017 0902   GLUCOSE 86 01/07/2017 0902   BUN 12.3 01/07/2017 0902   CREATININE 0.9 01/07/2017 0902   CALCIUM 9.1 01/07/2017 0902   PROT 6.7 01/07/2017 0902   PROT 7.5 01/07/2017 0902   ALBUMIN 3.6 01/07/2017 0902   AST 16 01/07/2017 0902   ALT 13 01/07/2017 0902   ALKPHOS 40 01/07/2017 0902   BILITOT 0.65 01/07/2017 0902   GFRNONAA >60 08/22/2016 1011   GFRAA >60 08/22/2016 1011    Medications: I have reviewed the patient's current medications.   Assessment/Plan:  1. Multiple myeloma-confirmed on a bone marrow biopsy 08/07/2014, IgA lambda  Serum M spike and increased serum free lambda light chains  Myeloma FISH panel negative for chromosome 4, 11, 12, 13, 14, and 17 abnormalities, cytogenetics with no metaphases  Bone survey 08/15/2014 with indeterminant lucent skull lesions  Cycle 1 RVD  08/22/2014  Cycle 2 RVD 09/19/2014  Cycle 3 RVD 10/17/2014  Cycle 4 RVD 11/14/2014 (Decadron reduced to 20 mg weekly, Revlimid 15 mg days 1 through 14, Velcade 3/4 weeks)  Cycle 5 RVD 12/12/2014  Serum M spike not detected 12/26/2014  Cycle 6 RVD 01/09/2015  Maintenance Revlimid, 10 mg daily, 02/05/2015 , discontinue 02/26/2015 secondary to leg edema and diarrhea  Revlimid resumed at a dose of 10 mg every other day beginning 03/13/2015  PET scan 24/58/0998-PJ hypermetabolic bone lesions, few lucent lesions in the spine and pelvis  Revlimid discontinued January 2019  Enrollment on a clinical trial at [facility] with pomalidomide/Decadron +/- ixazomib  2. 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 History of intermittent prostatitis  4. Skin rash 10/24/2014-referred to dermatology, biopsy consistent with local hypersensitivity reaction  5. 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 Left lower leg cellulitis 06/24/2015-treated with Keflex  7. Mild thrombocytopenia secondary to Revlimid versus myeloma  8. Mild neutropenia-likely secondary to Revlimid versus myeloma  9. Fall with a skull fracture and subarachnoid blood/right frontal lobe contusions 10/17/2015  10. Right lower lobe pulmonary embolus 03/28/2016. Lovenox , transitioned to Xarelto  11. 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  He will enroll on a clinical trial at [facility] within the next few weeks.  He appears to have a viral upper respiratory infection at present.  He reports the symptoms are improving.  He will contact us for a fever or  shortness of breath.  He will be followed closely at [facility] while on the clinical trial.  He will return for an office visit here in April.  I am available to see him in the interim as needed.  [name], MD  02/18/2017  4:01 PM

## 2017-02-18 NOTE — Telephone Encounter (Signed)
Gave avs and calendar for april °

## 2017-02-23 DIAGNOSIS — C9000 Multiple myeloma not having achieved remission: Secondary | ICD-10-CM | POA: Diagnosis not present

## 2017-02-23 DIAGNOSIS — Z7901 Long term (current) use of anticoagulants: Secondary | ICD-10-CM | POA: Diagnosis not present

## 2017-02-23 DIAGNOSIS — D801 Nonfamilial hypogammaglobulinemia: Secondary | ICD-10-CM | POA: Diagnosis not present

## 2017-02-23 DIAGNOSIS — M8588 Other specified disorders of bone density and structure, other site: Secondary | ICD-10-CM | POA: Diagnosis not present

## 2017-02-23 DIAGNOSIS — Z006 Encounter for examination for normal comparison and control in clinical research program: Secondary | ICD-10-CM | POA: Diagnosis not present

## 2017-02-23 DIAGNOSIS — M47816 Spondylosis without myelopathy or radiculopathy, lumbar region: Secondary | ICD-10-CM | POA: Diagnosis not present

## 2017-02-23 DIAGNOSIS — M47814 Spondylosis without myelopathy or radiculopathy, thoracic region: Secondary | ICD-10-CM | POA: Diagnosis not present

## 2017-02-23 DIAGNOSIS — M47812 Spondylosis without myelopathy or radiculopathy, cervical region: Secondary | ICD-10-CM | POA: Diagnosis not present

## 2017-02-23 DIAGNOSIS — S0281XS Fracture of other specified skull and facial bones, right side, sequela: Secondary | ICD-10-CM | POA: Diagnosis not present

## 2017-02-24 DIAGNOSIS — I2699 Other pulmonary embolism without acute cor pulmonale: Secondary | ICD-10-CM | POA: Diagnosis not present

## 2017-02-24 DIAGNOSIS — Z9221 Personal history of antineoplastic chemotherapy: Secondary | ICD-10-CM | POA: Diagnosis not present

## 2017-02-24 DIAGNOSIS — R197 Diarrhea, unspecified: Secondary | ICD-10-CM | POA: Diagnosis not present

## 2017-02-24 DIAGNOSIS — D61810 Antineoplastic chemotherapy induced pancytopenia: Secondary | ICD-10-CM | POA: Diagnosis not present

## 2017-02-24 DIAGNOSIS — C9000 Multiple myeloma not having achieved remission: Secondary | ICD-10-CM | POA: Diagnosis not present

## 2017-02-24 DIAGNOSIS — Z6824 Body mass index (BMI) 24.0-24.9, adult: Secondary | ICD-10-CM | POA: Diagnosis not present

## 2017-02-24 DIAGNOSIS — Z7901 Long term (current) use of anticoagulants: Secondary | ICD-10-CM | POA: Diagnosis not present

## 2017-02-24 DIAGNOSIS — R5383 Other fatigue: Secondary | ICD-10-CM | POA: Diagnosis not present

## 2017-02-24 DIAGNOSIS — C9002 Multiple myeloma in relapse: Secondary | ICD-10-CM | POA: Diagnosis not present

## 2017-02-24 DIAGNOSIS — R0781 Pleurodynia: Secondary | ICD-10-CM | POA: Diagnosis not present

## 2017-02-24 DIAGNOSIS — Z006 Encounter for examination for normal comparison and control in clinical research program: Secondary | ICD-10-CM | POA: Diagnosis not present

## 2017-02-24 DIAGNOSIS — D61818 Other pancytopenia: Secondary | ICD-10-CM | POA: Diagnosis not present

## 2017-02-24 DIAGNOSIS — D801 Nonfamilial hypogammaglobulinemia: Secondary | ICD-10-CM | POA: Diagnosis not present

## 2017-03-04 MED FILL — XARELTO 20 MG TABLET: 20 | 30 days supply | Qty: 30 | Fill #5

## 2017-03-10 DIAGNOSIS — D61818 Other pancytopenia: Secondary | ICD-10-CM | POA: Diagnosis not present

## 2017-03-10 DIAGNOSIS — Z6825 Body mass index (BMI) 25.0-25.9, adult: Secondary | ICD-10-CM | POA: Diagnosis not present

## 2017-03-10 DIAGNOSIS — R5383 Other fatigue: Secondary | ICD-10-CM | POA: Diagnosis not present

## 2017-03-10 DIAGNOSIS — Z006 Encounter for examination for normal comparison and control in clinical research program: Secondary | ICD-10-CM | POA: Diagnosis not present

## 2017-03-10 DIAGNOSIS — I2699 Other pulmonary embolism without acute cor pulmonale: Secondary | ICD-10-CM | POA: Diagnosis not present

## 2017-03-10 DIAGNOSIS — R51 Headache: Secondary | ICD-10-CM | POA: Diagnosis not present

## 2017-03-10 DIAGNOSIS — C9000 Multiple myeloma not having achieved remission: Secondary | ICD-10-CM | POA: Diagnosis not present

## 2017-03-10 DIAGNOSIS — K529 Noninfective gastroenteritis and colitis, unspecified: Secondary | ICD-10-CM | POA: Diagnosis not present

## 2017-03-10 DIAGNOSIS — K5900 Constipation, unspecified: Secondary | ICD-10-CM | POA: Diagnosis not present

## 2017-03-17 ENCOUNTER — Telehealth: Payer: Self-pay

## 2017-03-19 ENCOUNTER — Telehealth: Payer: Self-pay | Admitting: Oncology

## 2017-03-19 NOTE — Telephone Encounter (Signed)
Scheduled appt per 2/21 sch message - left message with appt date and time.

## 2017-03-23 NOTE — Telephone Encounter (Signed)
2/19 - call from pt requesting "infusions for my bones" per his MD at [facility]. From [facility] office notes, Zometa infusions mentioned. Made [name] aware. Pt to be seen before initiating tx.

## 2017-03-25 ENCOUNTER — Other Ambulatory Visit: Payer: Self-pay | Admitting: Oncology

## 2017-03-25 ENCOUNTER — Ambulatory Visit: Payer: Medicare Other | Admitting: Oncology

## 2017-03-25 ENCOUNTER — Ambulatory Visit: Payer: Medicare Other

## 2017-03-25 DIAGNOSIS — Z6825 Body mass index (BMI) 25.0-25.9, adult: Secondary | ICD-10-CM | POA: Diagnosis not present

## 2017-03-25 DIAGNOSIS — K5903 Drug induced constipation: Secondary | ICD-10-CM | POA: Diagnosis not present

## 2017-03-25 DIAGNOSIS — C9000 Multiple myeloma not having achieved remission: Secondary | ICD-10-CM | POA: Diagnosis not present

## 2017-03-25 DIAGNOSIS — D61810 Antineoplastic chemotherapy induced pancytopenia: Secondary | ICD-10-CM | POA: Diagnosis not present

## 2017-03-25 DIAGNOSIS — D801 Nonfamilial hypogammaglobulinemia: Secondary | ICD-10-CM | POA: Diagnosis not present

## 2017-03-25 DIAGNOSIS — Z006 Encounter for examination for normal comparison and control in clinical research program: Secondary | ICD-10-CM | POA: Diagnosis not present

## 2017-03-25 DIAGNOSIS — D61818 Other pancytopenia: Secondary | ICD-10-CM | POA: Diagnosis not present

## 2017-03-25 DIAGNOSIS — T50995A Adverse effect of other drugs, medicaments and biological substances, initial encounter: Secondary | ICD-10-CM | POA: Diagnosis not present

## 2017-03-25 DIAGNOSIS — K529 Noninfective gastroenteritis and colitis, unspecified: Secondary | ICD-10-CM | POA: Diagnosis not present

## 2017-03-25 DIAGNOSIS — I2699 Other pulmonary embolism without acute cor pulmonale: Secondary | ICD-10-CM | POA: Diagnosis not present

## 2017-03-25 DIAGNOSIS — Z79899 Other long term (current) drug therapy: Secondary | ICD-10-CM | POA: Diagnosis not present

## 2017-03-25 DIAGNOSIS — C9002 Multiple myeloma in relapse: Secondary | ICD-10-CM | POA: Diagnosis not present

## 2017-03-25 DIAGNOSIS — Z7901 Long term (current) use of anticoagulants: Secondary | ICD-10-CM | POA: Diagnosis not present

## 2017-03-25 DIAGNOSIS — R202 Paresthesia of skin: Secondary | ICD-10-CM | POA: Diagnosis not present

## 2017-03-27 ENCOUNTER — Inpatient Hospital Stay: Payer: Medicare Other | Attending: Oncology | Admitting: Oncology

## 2017-03-27 ENCOUNTER — Inpatient Hospital Stay: Payer: Medicare Other

## 2017-03-27 VITALS — BP 102/43 | HR 50

## 2017-03-27 VITALS — BP 115/47 | HR 58 | Temp 97.8°F | Resp 20 | Ht 71.0 in | Wt 184.6 lb

## 2017-03-27 DIAGNOSIS — Z7901 Long term (current) use of anticoagulants: Secondary | ICD-10-CM | POA: Diagnosis not present

## 2017-03-27 DIAGNOSIS — C9000 Multiple myeloma not having achieved remission: Secondary | ICD-10-CM

## 2017-03-27 DIAGNOSIS — Z923 Personal history of irradiation: Secondary | ICD-10-CM | POA: Insufficient documentation

## 2017-03-27 DIAGNOSIS — Z79899 Other long term (current) drug therapy: Secondary | ICD-10-CM | POA: Diagnosis not present

## 2017-03-27 DIAGNOSIS — Z86711 Personal history of pulmonary embolism: Secondary | ICD-10-CM | POA: Diagnosis not present

## 2017-03-27 MED ORDER — ZOLEDRONIC ACID 4 MG/100ML IV SOLN
4.0000 mg | Freq: Once | INTRAVENOUS | Status: AC
  Administered 2017-03-27: 4 mg via INTRAVENOUS
  Filled 2017-03-27: qty 100

## 2017-03-27 NOTE — Progress Notes (Signed)
[name] OFFICE PROGRESS NOTE   Diagnosis: Multiple myeloma  INTERVAL HISTORY:   Mitchell Lowery returns as scheduled.  He is now being treated on study at [facility] with pomalidomide, Decadron, and ixzomib.  A survey at [facility] 02/23/2017 revealed a focal lytic lesion at the left lesser trochanter.  Was also suspicion for lytic lesions at distal radii and ulna.  [facility] he recommends he began Zometa. Mitchell Lowery has noted weight gain since beginning Decadron.  He feels "lightheaded".  He has malaise this week.  He continues to exercise.  No neuropathy symptoms.  The diarrhea resolved when he discontinued Revlimid.  Objective:  Vital signs in last 24 hours:  Blood pressure (!) 115/47, pulse (!) 58, temperature 97.8 F (36.6 C), temperature source Oral, resp. rate 20, height _0  (1.803 m), weight 184 lb 9.6 oz (83.7 kg), SpO2 97 %.    HEENT: No thrush or ulcers Resp: Clear bilaterally Cardio: Regular rate and rhythm with premature beats, 2/6 systolic murmur GI: Nontender, no hepatosplenomegaly Vascular: The left lower leg is slightly larger than the right side      Lab Results:   Medications: I have reviewed the patient's current medications.   Assessment/Plan: 1. Multiple myeloma-confirmed on a bone marrow biopsy 08/07/2014, IgA lambda  Serum M spike and increased serum free lambda light chains  Myeloma FISH panel negative for chromosome 4, 11, 12, 13, 14, and 17 abnormalities, cytogenetics with no metaphases  Bone survey 08/15/2014 with indeterminant lucent skull lesions  Cycle 1 RVD 08/22/2014  Cycle 2 RVD 09/19/2014  Cycle 3 RVD 10/17/2014  Cycle 4 RVD 11/14/2014 (Decadron reduced to 20 mg weekly, Revlimid 15 mg days 1 through 14, Velcade 3/4 weeks)  Cycle 5 RVD 12/12/2014  Serum M spike not detected 12/26/2014  Cycle 6 RVD 01/09/2015  Maintenance Revlimid, 10 mg daily, 02/05/2015 , discontinue 02/26/2015 secondary to leg edema and diarrhea  Revlimid  resumed at a dose of 10 mg every other day beginning 03/13/2015  PET scan 27/74/1287-OM hypermetabolic bone lesions, few lucent lesions in the spine and pelvis  Revlimid discontinued January 2019  Enrollment on a clinical trial at [facility] with pomalidomide/Decadron +/- ixazomib beginning 03/10/2017  2. 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 History of intermittent prostatitis  4. Skin rash 10/24/2014-referred to dermatology, biopsy consistent with local hypersensitivity reaction  5. 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 Left lower leg cellulitis 06/24/2015-treated with Keflex  7. Mild thrombocytopenia secondary to Revlimid versus myeloma  8. Mild neutropenia-likely secondary to Revlimid versus myeloma  9. Fall with a skull fracture and subarachnoid blood/right frontal lobe contusions 10/17/2015  10. Right lower lobe pulmonary embolus 03/28/2016. Lovenox , transitioned to Xarelto  11. Vesicular rashJune 2018-potentially related to the zoster vaccine, resolved  Disposition: Mitchell Lowery appears stable.  He is currently rolled on a clinical trial with [facility].  He will continue treatment and close clinical follow-up at [facility].  Mitchell Lowery recommends beginning Zometa prophylaxis.  I reviewed the risk associated with Zometa and Mitchell Lowery agrees to proceed.  We will start on a 69-monthschedule unless Dr. TAmalia Haileyrecommends monthly therapy.  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  I am available to see him in the interim as needed.  25 minutes were  spent with the patient today.  The majority of the time was used for counseling and coordination of care.  Mitchell Coder, MD  03/27/2017    8:36 AM

## 2017-03-27 NOTE — Progress Notes (Signed)
Ok to treat using [facility] labs from 2/27 and platelet value of 81 per MD Sherrill.

## 2017-03-27 NOTE — Patient Instructions (Signed)

## 2017-03-30 ENCOUNTER — Telehealth: Payer: Self-pay | Admitting: *Deleted

## 2017-03-30 NOTE — Telephone Encounter (Signed)
-----   Message from Arman Bogus, RN sent at 03/27/2017 12:15 PM EST ----- Regarding: Mitchell Lowery, 1st time Zometa Please call to follow up on 1st time Zometa

## 2017-03-30 NOTE — Telephone Encounter (Signed)
Called pt to follow up after Zometa treatment. He reports minor soreness, tolerated well overall. He understands to call office if needed prior to next visit.

## 2017-04-01 ENCOUNTER — Other Ambulatory Visit: Payer: Self-pay | Admitting: Oncology

## 2017-04-01 DIAGNOSIS — I2699 Other pulmonary embolism without acute cor pulmonale: Secondary | ICD-10-CM

## 2017-04-01 MED FILL — XARELTO 20 MG TABLET: 20 | 30 days supply | Qty: 30 | Fill #0

## 2017-04-07 DIAGNOSIS — Z7901 Long term (current) use of anticoagulants: Secondary | ICD-10-CM | POA: Diagnosis not present

## 2017-04-07 DIAGNOSIS — Z86711 Personal history of pulmonary embolism: Secondary | ICD-10-CM | POA: Diagnosis not present

## 2017-04-07 DIAGNOSIS — Z6825 Body mass index (BMI) 25.0-25.9, adult: Secondary | ICD-10-CM | POA: Diagnosis not present

## 2017-04-07 DIAGNOSIS — I2699 Other pulmonary embolism without acute cor pulmonale: Secondary | ICD-10-CM | POA: Diagnosis not present

## 2017-04-07 DIAGNOSIS — Z006 Encounter for examination for normal comparison and control in clinical research program: Secondary | ICD-10-CM | POA: Diagnosis not present

## 2017-04-07 DIAGNOSIS — C9000 Multiple myeloma not having achieved remission: Secondary | ICD-10-CM | POA: Diagnosis not present

## 2017-04-07 DIAGNOSIS — D61810 Antineoplastic chemotherapy induced pancytopenia: Secondary | ICD-10-CM | POA: Diagnosis not present

## 2017-04-14 DIAGNOSIS — Z7901 Long term (current) use of anticoagulants: Secondary | ICD-10-CM | POA: Diagnosis not present

## 2017-04-14 DIAGNOSIS — Z006 Encounter for examination for normal comparison and control in clinical research program: Secondary | ICD-10-CM | POA: Diagnosis not present

## 2017-04-14 DIAGNOSIS — S069X9S Unspecified intracranial injury with loss of consciousness of unspecified duration, sequela: Secondary | ICD-10-CM | POA: Diagnosis not present

## 2017-04-14 DIAGNOSIS — S0292XS Unspecified fracture of facial bones, sequela: Secondary | ICD-10-CM | POA: Diagnosis not present

## 2017-04-14 DIAGNOSIS — D801 Nonfamilial hypogammaglobulinemia: Secondary | ICD-10-CM | POA: Diagnosis not present

## 2017-04-14 DIAGNOSIS — R5383 Other fatigue: Secondary | ICD-10-CM | POA: Diagnosis not present

## 2017-04-14 DIAGNOSIS — K529 Noninfective gastroenteritis and colitis, unspecified: Secondary | ICD-10-CM | POA: Diagnosis not present

## 2017-04-14 DIAGNOSIS — D709 Neutropenia, unspecified: Secondary | ICD-10-CM | POA: Diagnosis not present

## 2017-04-14 DIAGNOSIS — Z6825 Body mass index (BMI) 25.0-25.9, adult: Secondary | ICD-10-CM | POA: Diagnosis not present

## 2017-04-14 DIAGNOSIS — D61810 Antineoplastic chemotherapy induced pancytopenia: Secondary | ICD-10-CM | POA: Diagnosis not present

## 2017-04-14 DIAGNOSIS — I2699 Other pulmonary embolism without acute cor pulmonale: Secondary | ICD-10-CM | POA: Diagnosis not present

## 2017-04-14 DIAGNOSIS — C9000 Multiple myeloma not having achieved remission: Secondary | ICD-10-CM | POA: Diagnosis not present

## 2017-04-28 DIAGNOSIS — C9000 Multiple myeloma not having achieved remission: Secondary | ICD-10-CM | POA: Diagnosis not present

## 2017-04-28 DIAGNOSIS — Z006 Encounter for examination for normal comparison and control in clinical research program: Secondary | ICD-10-CM | POA: Diagnosis not present

## 2017-04-29 ENCOUNTER — Ambulatory Visit: Payer: Medicare Other | Admitting: Oncology

## 2017-05-04 MED FILL — XARELTO 20 MG TABLET: 20 | 30 days supply | Qty: 30 | Fill #1

## 2017-05-12 DIAGNOSIS — I2699 Other pulmonary embolism without acute cor pulmonale: Secondary | ICD-10-CM | POA: Diagnosis not present

## 2017-05-12 DIAGNOSIS — D61810 Antineoplastic chemotherapy induced pancytopenia: Secondary | ICD-10-CM | POA: Diagnosis not present

## 2017-05-12 DIAGNOSIS — Z006 Encounter for examination for normal comparison and control in clinical research program: Secondary | ICD-10-CM | POA: Diagnosis not present

## 2017-05-12 DIAGNOSIS — C9002 Multiple myeloma in relapse: Secondary | ICD-10-CM | POA: Diagnosis not present

## 2017-05-12 DIAGNOSIS — Z6826 Body mass index (BMI) 26.0-26.9, adult: Secondary | ICD-10-CM | POA: Diagnosis not present

## 2017-05-12 DIAGNOSIS — R5383 Other fatigue: Secondary | ICD-10-CM | POA: Diagnosis not present

## 2017-05-12 DIAGNOSIS — C9000 Multiple myeloma not having achieved remission: Secondary | ICD-10-CM | POA: Diagnosis not present

## 2017-05-12 DIAGNOSIS — Z7901 Long term (current) use of anticoagulants: Secondary | ICD-10-CM | POA: Diagnosis not present

## 2017-05-12 DIAGNOSIS — K529 Noninfective gastroenteritis and colitis, unspecified: Secondary | ICD-10-CM | POA: Diagnosis not present

## 2017-05-12 DIAGNOSIS — Z923 Personal history of irradiation: Secondary | ICD-10-CM | POA: Diagnosis not present

## 2017-05-12 DIAGNOSIS — D61818 Other pancytopenia: Secondary | ICD-10-CM | POA: Diagnosis not present

## 2017-05-14 ENCOUNTER — Other Ambulatory Visit: Payer: Self-pay

## 2017-05-14 ENCOUNTER — Telehealth: Payer: Self-pay | Admitting: Oncology

## 2017-05-14 DIAGNOSIS — C9001 Multiple myeloma in remission: Secondary | ICD-10-CM

## 2017-05-14 NOTE — Telephone Encounter (Signed)
Scheduled appt per 4/18 sch message - left message with appt date and time.

## 2017-05-26 ENCOUNTER — Inpatient Hospital Stay: Payer: Medicare Other | Attending: Oncology

## 2017-05-26 ENCOUNTER — Telehealth: Payer: Self-pay | Admitting: *Deleted

## 2017-05-26 DIAGNOSIS — C9000 Multiple myeloma not having achieved remission: Secondary | ICD-10-CM | POA: Diagnosis not present

## 2017-05-26 DIAGNOSIS — Z7901 Long term (current) use of anticoagulants: Secondary | ICD-10-CM | POA: Diagnosis not present

## 2017-05-26 DIAGNOSIS — Z86711 Personal history of pulmonary embolism: Secondary | ICD-10-CM | POA: Diagnosis not present

## 2017-05-26 DIAGNOSIS — Z923 Personal history of irradiation: Secondary | ICD-10-CM | POA: Insufficient documentation

## 2017-05-26 DIAGNOSIS — Z79899 Other long term (current) drug therapy: Secondary | ICD-10-CM | POA: Diagnosis not present

## 2017-05-26 DIAGNOSIS — C9001 Multiple myeloma in remission: Secondary | ICD-10-CM

## 2017-05-26 LAB — CBC WITH DIFFERENTIAL (CANCER CENTER ONLY)
Basophils Absolute: 0 10*3/uL (ref 0.0–0.1)
Basophils Relative: 1 %
Eosinophils Absolute: 0.2 10*3/uL (ref 0.0–0.5)
Eosinophils Relative: 6 %
HCT: 36.7 % — ABNORMAL LOW (ref 38.4–49.9)
Hemoglobin: 11.9 g/dL — ABNORMAL LOW (ref 13.0–17.1)
Lymphocytes Relative: 18 %
Lymphs Abs: 0.6 10*3/uL — ABNORMAL LOW (ref 0.9–3.3)
MCH: 29.6 pg (ref 27.2–33.4)
MCHC: 32.3 g/dL (ref 32.0–36.0)
MCV: 91.6 fL (ref 79.3–98.0)
Monocytes Absolute: 0.8 10*3/uL (ref 0.1–0.9)
Monocytes Relative: 20 %
Neutro Abs: 2 10*3/uL (ref 1.5–6.5)
Neutrophils Relative %: 55 %
Platelet Count: 113 10*3/uL — ABNORMAL LOW (ref 140–400)
RBC: 4.01 MIL/uL — ABNORMAL LOW (ref 4.20–5.82)
RDW: 17.8 % — ABNORMAL HIGH (ref 11.0–14.6)
WBC Count: 3.7 10*3/uL — ABNORMAL LOW (ref 4.0–10.3)

## 2017-05-26 NOTE — Telephone Encounter (Signed)
"  Mitchell Lowery with [facility].  I am the study coordinator of this patient shared with [name].  He's been scheduled for labs today.  Did he keep today's appointment and how can they be shared to Care Everywhere for Dr. Amalia Hailey to view?  We'll need labs done locally a few more times for the study."  Today's CBC-diff lab results routed through Epic to Fax number provided, [phone].

## 2017-06-01 MED FILL — XARELTO 20 MG TABLET: 20 | 30 days supply | Qty: 30 | Fill #2

## 2017-06-04 DIAGNOSIS — I10 Essential (primary) hypertension: Secondary | ICD-10-CM | POA: Diagnosis not present

## 2017-06-04 DIAGNOSIS — C9000 Multiple myeloma not having achieved remission: Secondary | ICD-10-CM | POA: Diagnosis not present

## 2017-06-04 DIAGNOSIS — E559 Vitamin D deficiency, unspecified: Secondary | ICD-10-CM | POA: Diagnosis not present

## 2017-06-04 DIAGNOSIS — Z0000 Encounter for general adult medical examination without abnormal findings: Secondary | ICD-10-CM | POA: Diagnosis not present

## 2017-06-04 DIAGNOSIS — N400 Enlarged prostate without lower urinary tract symptoms: Secondary | ICD-10-CM | POA: Diagnosis not present

## 2017-06-04 DIAGNOSIS — D61818 Other pancytopenia: Secondary | ICD-10-CM | POA: Diagnosis not present

## 2017-06-04 DIAGNOSIS — Z86711 Personal history of pulmonary embolism: Secondary | ICD-10-CM | POA: Diagnosis not present

## 2017-06-04 DIAGNOSIS — D649 Anemia, unspecified: Secondary | ICD-10-CM | POA: Diagnosis not present

## 2017-06-04 DIAGNOSIS — Z1389 Encounter for screening for other disorder: Secondary | ICD-10-CM | POA: Diagnosis not present

## 2017-06-04 DIAGNOSIS — D696 Thrombocytopenia, unspecified: Secondary | ICD-10-CM | POA: Diagnosis not present

## 2017-06-04 DIAGNOSIS — R609 Edema, unspecified: Secondary | ICD-10-CM | POA: Diagnosis not present

## 2017-06-04 DIAGNOSIS — C61 Malignant neoplasm of prostate: Secondary | ICD-10-CM | POA: Diagnosis not present

## 2017-06-04 DIAGNOSIS — R5383 Other fatigue: Secondary | ICD-10-CM | POA: Diagnosis not present

## 2017-06-04 DIAGNOSIS — I700 Atherosclerosis of aorta: Secondary | ICD-10-CM | POA: Diagnosis not present

## 2017-06-09 DIAGNOSIS — R635 Abnormal weight gain: Secondary | ICD-10-CM | POA: Diagnosis not present

## 2017-06-09 DIAGNOSIS — C9000 Multiple myeloma not having achieved remission: Secondary | ICD-10-CM | POA: Diagnosis not present

## 2017-06-09 DIAGNOSIS — D801 Nonfamilial hypogammaglobulinemia: Secondary | ICD-10-CM | POA: Diagnosis not present

## 2017-06-09 DIAGNOSIS — Z79899 Other long term (current) drug therapy: Secondary | ICD-10-CM | POA: Diagnosis not present

## 2017-06-09 DIAGNOSIS — R5383 Other fatigue: Secondary | ICD-10-CM | POA: Diagnosis not present

## 2017-06-09 DIAGNOSIS — D61818 Other pancytopenia: Secondary | ICD-10-CM | POA: Diagnosis not present

## 2017-06-09 DIAGNOSIS — M549 Dorsalgia, unspecified: Secondary | ICD-10-CM | POA: Diagnosis not present

## 2017-06-09 DIAGNOSIS — C9002 Multiple myeloma in relapse: Secondary | ICD-10-CM | POA: Diagnosis not present

## 2017-06-09 DIAGNOSIS — Z7901 Long term (current) use of anticoagulants: Secondary | ICD-10-CM | POA: Diagnosis not present

## 2017-06-09 DIAGNOSIS — R600 Localized edema: Secondary | ICD-10-CM | POA: Diagnosis not present

## 2017-06-09 DIAGNOSIS — I2699 Other pulmonary embolism without acute cor pulmonale: Secondary | ICD-10-CM | POA: Diagnosis not present

## 2017-06-09 DIAGNOSIS — Z6826 Body mass index (BMI) 26.0-26.9, adult: Secondary | ICD-10-CM | POA: Diagnosis not present

## 2017-06-09 DIAGNOSIS — Z7982 Long term (current) use of aspirin: Secondary | ICD-10-CM | POA: Diagnosis not present

## 2017-06-09 DIAGNOSIS — Z006 Encounter for examination for normal comparison and control in clinical research program: Secondary | ICD-10-CM | POA: Diagnosis not present

## 2017-06-11 DIAGNOSIS — E538 Deficiency of other specified B group vitamins: Secondary | ICD-10-CM | POA: Diagnosis not present

## 2017-06-18 DIAGNOSIS — E538 Deficiency of other specified B group vitamins: Secondary | ICD-10-CM | POA: Diagnosis not present

## 2017-06-19 ENCOUNTER — Telehealth: Payer: Self-pay | Admitting: Oncology

## 2017-06-19 ENCOUNTER — Inpatient Hospital Stay: Payer: Medicare Other | Attending: Oncology

## 2017-06-19 ENCOUNTER — Inpatient Hospital Stay (HOSPITAL_BASED_OUTPATIENT_CLINIC_OR_DEPARTMENT_OTHER): Payer: Medicare Other | Admitting: Oncology

## 2017-06-19 ENCOUNTER — Other Ambulatory Visit: Payer: Self-pay

## 2017-06-19 VITALS — BP 127/60 | HR 83 | Temp 98.0°F | Resp 18 | Ht 71.0 in | Wt 193.3 lb

## 2017-06-19 DIAGNOSIS — I499 Cardiac arrhythmia, unspecified: Secondary | ICD-10-CM

## 2017-06-19 DIAGNOSIS — C9000 Multiple myeloma not having achieved remission: Secondary | ICD-10-CM

## 2017-06-19 DIAGNOSIS — Z923 Personal history of irradiation: Secondary | ICD-10-CM | POA: Insufficient documentation

## 2017-06-19 MED ORDER — ZOLEDRONIC ACID 4 MG/100ML IV SOLN
4.0000 mg | Freq: Once | INTRAVENOUS | Status: AC
  Administered 2017-06-19: 4 mg via INTRAVENOUS
  Filled 2017-06-19: qty 100

## 2017-06-19 NOTE — Progress Notes (Signed)
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  He has experienced a VGPR on this treatment.  He continues Xarelto anticoagulation.  He has developed lower back pain.  This is worse with certain movements.  The pain has improved.  He has mild tingling in the extremities.  He is constipated.  Leg swelling has improved with support stockings.  He has gained weight since beginning the current therapy.  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  He notes an elevated heart rate today.    Objective:  Vital signs in last 24 hours:  Blood pressure 127/60, pulse 83, temperature 98 F (36.7 C), temperature source Oral, resp. rate 18, height '5\' 11"'  (1.803 m), weight 193 lb 4.8 oz (87.7 kg), SpO2 97 %.    HEENT: Small ulcer at the posterior left buccal mucosa.  No thrush Resp: Lungs clear bilaterally, no respiratory distress Cardio: Irregular GI: No hepatosplenomegaly Vascular: Trace pitting edema at the left greater than right lower leg      Lab Results:  Lab Results  Component Value Date   WBC 3.7 (L) 05/26/2017   HGB 11.9 (L) 05/26/2017   HCT 36.7 (L) 05/26/2017   MCV 91.6 05/26/2017   PLT 113 (L) 05/26/2017   NEUTROABS 2.0 05/26/2017    Labs at [facility] on 06/09/2017: IgA 386, lambda light chains 2.97, no serum M spike, immunofixation confirmed an IgA lambda monoclonal protein Creatinine 0.71  Medications: I have reviewed the patient's current medications.   Assessment/Plan: 1. Multiple myeloma-confirmed on a bone marrow biopsy 08/07/2014, IgA lambda  Serum M spike and increased serum free lambda light chains  Myeloma FISH panel negative for chromosome 4, 11, 12, 13, 14, and 17 abnormalities, cytogenetics with no metaphases  Bone survey 08/15/2014 with indeterminant lucent skull lesions  Cycle 1 RVD 08/22/2014  Cycle  2 RVD 09/19/2014  Cycle 3 RVD 10/17/2014  Cycle 4 RVD 11/14/2014 (Decadron reduced to 20 mg weekly, Revlimid 15 mg days 1 through 14, Velcade 3/4 weeks)  Cycle 5 RVD 12/12/2014  Serum M spike not detected 12/26/2014  Cycle 6 RVD 01/09/2015  Maintenance Revlimid, 10 mg daily, 02/05/2015 , discontinue 02/26/2015 secondary to leg edema and diarrhea  Revlimid resumed at a dose of 10 mg every other day beginning 03/13/2015  PET scan 40/98/1191-YN hypermetabolic bone lesions, few lucent lesions in the spine and pelvis  Revlimid discontinued January 2019  Enrollment on a clinical trial at [facility] with pomalidomide/Decadron+/- ixazomib beginning 03/10/2017  2. 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 History of intermittent prostatitis  4. Skin rash 10/24/2014-referred to dermatology, biopsy consistent with local hypersensitivity reaction  5. 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 Left lower leg cellulitis 06/24/2015-treated with Keflex  7. History of mild thrombocytopenia secondary to myeloma and systemic therapy  8. History of mild neutropenia- secondary to myeloma and systemic therapy  9. Fall with a skull fracture and subarachnoid blood/right frontal lobe contusions 10/17/2015  10. Right lower lobe pulmonary embolus 03/28/2016. Lovenox , transitioned to Xarelto  11. 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  The myeloma  appears to be responding.  He is tolerating the treatment well.  He is scheduled for Zometa today.  He has an irregular heart rate today.  We will check an EKG to look for  evidence of atrial fibrillation.  The potassium and magnesium levels were normal at [facility] on 06/09/2017.  He will return for an office visit and Zometa in 3 months.  He will be followed at [facility] in the interim.  25 minutes were spent with the patient today.  The majority of the time was used for counseling and coordination of care.  [name], MD  06/19/2017  8:12 AM

## 2017-06-19 NOTE — Telephone Encounter (Signed)
Scheduled appt per 5/24 los - Gave patient AVS and calender per los.

## 2017-06-19 NOTE — Patient Instructions (Signed)

## 2017-06-22 DIAGNOSIS — I10 Essential (primary) hypertension: Secondary | ICD-10-CM | POA: Diagnosis not present

## 2017-06-22 DIAGNOSIS — N400 Enlarged prostate without lower urinary tract symptoms: Secondary | ICD-10-CM | POA: Diagnosis not present

## 2017-06-22 DIAGNOSIS — C61 Malignant neoplasm of prostate: Secondary | ICD-10-CM | POA: Diagnosis not present

## 2017-06-22 DIAGNOSIS — D61818 Other pancytopenia: Secondary | ICD-10-CM | POA: Diagnosis not present

## 2017-06-22 DIAGNOSIS — D649 Anemia, unspecified: Secondary | ICD-10-CM | POA: Diagnosis not present

## 2017-06-23 ENCOUNTER — Telehealth: Payer: Self-pay

## 2017-06-23 ENCOUNTER — Telehealth: Payer: Self-pay | Admitting: *Deleted

## 2017-06-23 ENCOUNTER — Inpatient Hospital Stay: Payer: Medicare Other

## 2017-06-23 DIAGNOSIS — I499 Cardiac arrhythmia, unspecified: Secondary | ICD-10-CM | POA: Diagnosis not present

## 2017-06-23 DIAGNOSIS — Z923 Personal history of irradiation: Secondary | ICD-10-CM | POA: Diagnosis not present

## 2017-06-23 DIAGNOSIS — C9001 Multiple myeloma in remission: Secondary | ICD-10-CM

## 2017-06-23 DIAGNOSIS — I4891 Unspecified atrial fibrillation: Secondary | ICD-10-CM

## 2017-06-23 DIAGNOSIS — C9000 Multiple myeloma not having achieved remission: Secondary | ICD-10-CM | POA: Diagnosis not present

## 2017-06-23 LAB — CBC WITH DIFFERENTIAL (CANCER CENTER ONLY)
BASOS ABS: 0 10*3/uL (ref 0.0–0.1)
Basophils Relative: 1 %
Eosinophils Absolute: 0.2 10*3/uL (ref 0.0–0.5)
Eosinophils Relative: 5 %
HEMATOCRIT: 38.2 % — AB (ref 38.4–49.9)
Hemoglobin: 12.3 g/dL — ABNORMAL LOW (ref 13.0–17.1)
LYMPHS ABS: 1 10*3/uL (ref 0.9–3.3)
LYMPHS PCT: 29 %
MCH: 29 pg (ref 27.2–33.4)
MCHC: 32.2 g/dL (ref 32.0–36.0)
MCV: 90 fL (ref 79.3–98.0)
MONO ABS: 0.7 10*3/uL (ref 0.1–0.9)
Monocytes Relative: 20 %
NEUTROS ABS: 1.7 10*3/uL (ref 1.5–6.5)
Neutrophils Relative %: 45 %
Platelet Count: 118 10*3/uL — ABNORMAL LOW (ref 140–400)
RBC: 4.25 MIL/uL (ref 4.20–5.82)
RDW: 17.1 % — ABNORMAL HIGH (ref 11.0–14.6)
WBC: 3.6 10*3/uL — AB (ref 4.0–10.3)

## 2017-06-23 NOTE — Telephone Encounter (Signed)
err

## 2017-06-23 NOTE — Telephone Encounter (Signed)
Per Dr. Burt Knack, patient is to have NP consult with echo prior to appointment.  Left message to call back.

## 2017-06-23 NOTE — Telephone Encounter (Signed)
Scheduled patient 6/4 for echocardiogram dx: afib Scheduled patient for New Patient evaluation with Dr. Burt Knack 6/7 Patient agrees with treatment plan.

## 2017-06-25 DIAGNOSIS — E559 Vitamin D deficiency, unspecified: Secondary | ICD-10-CM | POA: Diagnosis not present

## 2017-06-30 ENCOUNTER — Ambulatory Visit (HOSPITAL_COMMUNITY): Payer: Medicare Other | Attending: Internal Medicine

## 2017-06-30 ENCOUNTER — Other Ambulatory Visit: Payer: Self-pay

## 2017-06-30 DIAGNOSIS — I361 Nonrheumatic tricuspid (valve) insufficiency: Secondary | ICD-10-CM | POA: Insufficient documentation

## 2017-06-30 DIAGNOSIS — I4891 Unspecified atrial fibrillation: Secondary | ICD-10-CM | POA: Insufficient documentation

## 2017-06-30 DIAGNOSIS — I10 Essential (primary) hypertension: Secondary | ICD-10-CM | POA: Insufficient documentation

## 2017-06-30 DIAGNOSIS — I350 Nonrheumatic aortic (valve) stenosis: Secondary | ICD-10-CM | POA: Diagnosis not present

## 2017-06-30 MED FILL — XARELTO 20 MG TABLET: 20 | 30 days supply | Qty: 30 | Fill #3

## 2017-07-02 DIAGNOSIS — E538 Deficiency of other specified B group vitamins: Secondary | ICD-10-CM | POA: Diagnosis not present

## 2017-07-03 ENCOUNTER — Ambulatory Visit (INDEPENDENT_AMBULATORY_CARE_PROVIDER_SITE_OTHER): Payer: Medicare Other | Admitting: Cardiovascular Disease

## 2017-07-03 ENCOUNTER — Encounter: Payer: Self-pay | Admitting: Cardiovascular Disease

## 2017-07-03 VITALS — BP 126/68 | HR 63 | Ht 70.0 in | Wt 191.4 lb

## 2017-07-03 DIAGNOSIS — I480 Paroxysmal atrial fibrillation: Secondary | ICD-10-CM | POA: Diagnosis not present

## 2017-07-03 DIAGNOSIS — I350 Nonrheumatic aortic (valve) stenosis: Secondary | ICD-10-CM

## 2017-07-03 NOTE — Patient Instructions (Signed)
Medication Instructions:  Your provider recommends that you continue on your current medications as directed. Please refer to the Current Medication list given to you today.    Labwork: None  Testing/Procedures: Your provider has requested that you have an echocardiogram in 1 year. Echocardiography is a painless test that uses sound waves to create images of your heart. It provides your doctor with information about the size and shape of your heart and how well your heart's chambers and valves are working. This procedure takes approximately one hour. There are no restrictions for this procedure.  Follow-Up: Your provider wants you to follow-up in: 1 year with Dr. Cooper. You will receive a reminder letter in the mail two months in advance. If you don't receive a letter, please call our office to schedule the follow-up appointment.    Any Other Special Instructions Will Be Listed Below (If Applicable).     If you need a refill on your cardiac medications before your next appointment, please call your pharmacy.   

## 2017-07-03 NOTE — Progress Notes (Signed)
See other note

## 2017-07-03 NOTE — Progress Notes (Signed)
Cardiology Office Note Date:  07/03/2017   ID:  Mitchell Lowery, DOB August 11, 1934, MRN [id number]  PCP:  [name], MD  Cardiologist:  Sherren Mocha, MD    Chief Complaint  Patient presents with  . Atrial Fibrillation   History of Present Illness: Mitchell Lowery is a 83 y.o. male who presents for evaluation of atrial fibrillation.   The patient is here alone today. He is followed by Dr Benay Spice for multiple myeloma. At the time of his last evaluation, he was noted to have an irregular heart beat and an EKG demonstrated atrial fibrillation. He presents today for further evaluation.   He has no history of cardiac disease, other than a longstanding heart murmur. He's been undergoing treatment for myeloma for a few years now. He exercises regularly and some days he feels poorly with exercise.  Describes a feeling of weakness and mild shortness of breath on those days.  Other times he feels well.  He denies any exertional chest pain or pressure.  He denies lightheadedness or syncope.  He has been anticoagulated since he had a PE 2 years ago and tolerates Xarelto fairly well.   Past Medical History:  Diagnosis Date  . Anal fistula    treated in [place]  . Anemia    only due to myeloma- "normal now"  . Heart murmur   . Hemorrhoids   . Multiple myeloma (HCC)    1 month remission now-last tx. 1 month ago- /[name]  . Prostate cancer [facility])    urinary retention, surgery planned-prior radiation, and seed implant about 1 year ago.  . Prostatitis   . Pulmonary embolus, left ([place]) 03/28/2016  . UTI (lower urinary tract infection)    multiple urinary trract infection- being tx presently with antibiotic at present.    Past Surgical History:  Procedure Laterality Date  . ANAL FISTULECTOMY    . ANKLE SURGERY    . CYSTOSCOPY    . CYSTOSCOPY WITH INSERTION OF UROLIFT N/A 03/12/2015   Procedure: CYSTOSCOPY WITH INSERTION OF UROLIFT;  Surgeon: Franchot Gallo, MD;  Location: [facility];   Service: Urology;  Laterality: N/A;  . PROSTATE BIOPSY    . PROSTATE BIOPSY    . RADIOACTIVE SEED IMPLANT N/A 07/21/2014   Procedure: RADIOACTIVE SEED IMPLANT/BRACHYTHERAPY IMPLANT;  Surgeon: Franchot Gallo, MD;  Location[facility];  Service: Urology;  Laterality: N/A;  . TONSILLECTOMY      Current Outpatient Medications  Medication Sig Dispense Refill  . Cholecalciferol 2000 units TABS Take 1 tablet by mouth daily.    Marland Kitchen dexamethasone (DECADRON) 4 MG tablet Take 20 mg by mouth once a week.    . diphenoxylate-atropine (LOMOTIL) 2.5-0.025 MG tablet Take 1 tablet by mouth daily as needed for diarrhea or loose stools. Usually takes 1 tablet a week    . IXAZOMIB CITRATE PO Take 4 mg by mouth once a week. 3 weeks on, 1 week off    . losartan (COZAAR) 50 MG tablet Take 50 mg by mouth daily.     . pomalidomide (POMALYST) 4 MG capsule 4 mg. TAKE 4 MG BY MOUTH FOR DAILY FOR 3 WEEKS THEN 1 WEEK OFF    . vitamin B-12 (CYANOCOBALAMIN) 500 MCG tablet Take 500 mcg by mouth daily.    Alveda Reasons 20 MG TABS tablet TAKE 1 TABLET BY MOUTH DAILY WITH SUPPER. 30 tablet 11   No current facility-administered medications for this visit.     Allergies:   Patient has  no known allergies.   Social History:  The patient  reports that he has never smoked. He has never used smokeless tobacco. He reports that he drinks alcohol. He reports that he does not use drugs.   Family History:  The patient's family history includes Cancer in his maternal grandfather. Father died of malaria. No CAD in the family.   ROS:  Please see the history of present illness.  Otherwise, review of systems is positive for leg swelling, diarrhea, constipation.  All other systems are reviewed and negative.   PHYSICAL EXAM: VS:  BP 126/68   Pulse 63   Ht '5\' 10"'  (1.778 m)   Wt 191 lb 6.4 oz (86.8 kg)   SpO2 98%   BMI 27.46 kg/m  , BMI Body mass index is 27.46 kg/m. GEN: Well nourished, well developed, pleasant elderly  male in no acute distress  HEENT: normal  Neck: no JVD, no masses. No carotid bruits Cardiac: RRR with 2/6 harsh mid-peaking systolic murmur at the RUSB            Respiratory:  clear to auscultation bilaterally, normal work of breathing GI: soft, nontender, nondistended, + BS MS: no deformity or atrophy  Ext: 1+ bilateral pretibial edema, pedal pulses 2+= bilaterally Skin: warm and dry, no rash Neuro:  Strength and sensation are intact Psych: euthymic mood, full affect  EKG:  EKG is not ordered today. EKG dated 06/19/2017 demonstrates atrial fibrillation at a rate of 86 bpm with ST/T wave abnormality consider inferolateral ischemia.  Recent Labs: 08/22/2016: B Natriuretic Peptide 79.1 01/07/2017: ALT 13; BUN 12.3; Creatinine 0.9; Potassium 3.9; Sodium 142 06/23/2017: Hemoglobin 12.3; Platelet Count 118   Lipid Panel  No results found for: CHOL, TRIG, HDL, CHOLHDL, VLDL, LDLCALC, LDLDIRECT    Wt Readings from Last 3 Encounters:  07/03/17 191 lb 6.4 oz (86.8 kg)  06/19/17 193 lb 4.8 oz (87.7 kg)  03/27/17 184 lb 9.6 oz (83.7 kg)     Cardiac Studies Reviewed: Echo 06/30/2017: Study Conclusions  - Left ventricle: The cavity size was normal. Wall thickness was   increased in a pattern of moderate LVH. Systolic function was   normal. The estimated ejection fraction was in the range of 60%   to 65%. Wall motion was normal; there were no regional wall   motion abnormalities. The study is not technically sufficient to   allow evaluation of LV diastolic function. - Aortic valve: Calcified leaflets with moderate stenosis. Mean   gradient (S): 26 mm Hg. Peak gradient (S): 55 mm Hg. Valve area   (VTI): 1.17 cm^2. Valve area (Vmean): 1.16 cm^2. - Mitral valve: Calcified annulus. Mildly thickened leaflets .   There was trivial regurgitation. - Left atrium: The atrium was mildly dilated. - Right atrium: The atrium was mildly dilated. - Atrial septum: Aneurysmal IAS- cannot exclude PFO. -  Tricuspid valve: There was mild regurgitation. - Pulmonary arteries: PA peak pressure: 25 mm Hg (S). - Inferior vena cava: The vessel was normal in size. The   respirophasic diameter changes were in the normal range (>= 50%),   consistent with normal central venous pressure.  Impressions:  - Compared to a prior study in 2017, the LVEF is higher at 60-65%.   There is now moderate aortic stenosis with a mean gradient of 26   mmHg and AVA around 1.2 cm2.   ASSESSMENT AND PLAN: 1.  Paroxysmal atrial fibrillation: The patient had incidentally noted atrial fibrillation at the time of her recent oncology  visit and EKG confirmed the diagnosis.  I reviewed the natural history of atrial fibrillation with the patient today.  He understands associated risk of stroke.  Fortunately he is on oral anticoagulation with rivaroxaban after a pulmonary embolus 2 years ago.  He will require lifelong anticoagulation for atrial fibrillation. His CHADS-Vasc score is 3 (2 for age >10 and 1 for HTN).  His exam suggests normal sinus rhythm today.  I reviewed heart rate readings that he brings in and it appears that he is in sinus rhythm most of the time.  He has had episodes of marked sinus bradycardia in review of some of his old EKGs.  I think it is best to avoid AV nodal blocking agents and I certainly would not avoid any rhythm control agents as he appears to be minimally symptomatic at this point.  It is possible that the presence of atrial fibrillation contributes to his episodic exercise intolerance.  2.  Aortic stenosis, moderate: I reviewed the patient's echocardiogram and he has a mean transaortic gradient of 26 mmHg.  We reviewed the natural history of aortic stenosis.  He will have a repeat echocardiogram in 1 year at the time of his follow-up visit.  Current medicines are reviewed with the patient today.  The patient does not have concerns regarding medicines.  Labs/ tests ordered today include:   Orders  Placed This Encounter  Procedures  . ECHOCARDIOGRAM COMPLETE    Disposition:   FU one year with a repeat echocardiogram  Signed, Sherren Mocha, MD  07/03/2017 5:19 PM    [facility] [place], [place], [place]  [address] Phone: [phone]; Fax: [phone]

## 2017-07-07 DIAGNOSIS — Z006 Encounter for examination for normal comparison and control in clinical research program: Secondary | ICD-10-CM | POA: Diagnosis not present

## 2017-07-07 DIAGNOSIS — I480 Paroxysmal atrial fibrillation: Secondary | ICD-10-CM | POA: Diagnosis not present

## 2017-07-07 DIAGNOSIS — D801 Nonfamilial hypogammaglobulinemia: Secondary | ICD-10-CM | POA: Diagnosis not present

## 2017-07-07 DIAGNOSIS — C9002 Multiple myeloma in relapse: Secondary | ICD-10-CM | POA: Diagnosis not present

## 2017-07-07 DIAGNOSIS — D61810 Antineoplastic chemotherapy induced pancytopenia: Secondary | ICD-10-CM | POA: Diagnosis not present

## 2017-07-07 DIAGNOSIS — R972 Elevated prostate specific antigen [PSA]: Secondary | ICD-10-CM | POA: Diagnosis not present

## 2017-07-07 DIAGNOSIS — I2699 Other pulmonary embolism without acute cor pulmonale: Secondary | ICD-10-CM | POA: Diagnosis not present

## 2017-07-07 DIAGNOSIS — S0291XS Unspecified fracture of skull, sequela: Secondary | ICD-10-CM | POA: Diagnosis not present

## 2017-07-07 DIAGNOSIS — Z923 Personal history of irradiation: Secondary | ICD-10-CM | POA: Diagnosis not present

## 2017-07-07 DIAGNOSIS — Z79899 Other long term (current) drug therapy: Secondary | ICD-10-CM | POA: Diagnosis not present

## 2017-07-07 DIAGNOSIS — T148XXS Other injury of unspecified body region, sequela: Secondary | ICD-10-CM | POA: Diagnosis not present

## 2017-07-07 DIAGNOSIS — R001 Bradycardia, unspecified: Secondary | ICD-10-CM | POA: Diagnosis not present

## 2017-07-07 DIAGNOSIS — R600 Localized edema: Secondary | ICD-10-CM | POA: Diagnosis not present

## 2017-07-07 DIAGNOSIS — I10 Essential (primary) hypertension: Secondary | ICD-10-CM | POA: Diagnosis not present

## 2017-07-07 DIAGNOSIS — Z6826 Body mass index (BMI) 26.0-26.9, adult: Secondary | ICD-10-CM | POA: Diagnosis not present

## 2017-07-07 DIAGNOSIS — R5383 Other fatigue: Secondary | ICD-10-CM | POA: Diagnosis not present

## 2017-07-07 DIAGNOSIS — C9000 Multiple myeloma not having achieved remission: Secondary | ICD-10-CM | POA: Diagnosis not present

## 2017-07-07 DIAGNOSIS — Z7901 Long term (current) use of anticoagulants: Secondary | ICD-10-CM | POA: Diagnosis not present

## 2017-07-07 DIAGNOSIS — D61818 Other pancytopenia: Secondary | ICD-10-CM | POA: Diagnosis not present

## 2017-07-07 DIAGNOSIS — R197 Diarrhea, unspecified: Secondary | ICD-10-CM | POA: Diagnosis not present

## 2017-07-09 DIAGNOSIS — Z8546 Personal history of malignant neoplasm of prostate: Secondary | ICD-10-CM | POA: Diagnosis not present

## 2017-07-15 DIAGNOSIS — H2513 Age-related nuclear cataract, bilateral: Secondary | ICD-10-CM | POA: Diagnosis not present

## 2017-07-15 DIAGNOSIS — H5203 Hypermetropia, bilateral: Secondary | ICD-10-CM | POA: Diagnosis not present

## 2017-07-29 DIAGNOSIS — N400 Enlarged prostate without lower urinary tract symptoms: Secondary | ICD-10-CM | POA: Diagnosis not present

## 2017-07-29 DIAGNOSIS — C61 Malignant neoplasm of prostate: Secondary | ICD-10-CM | POA: Diagnosis not present

## 2017-07-29 DIAGNOSIS — N5201 Erectile dysfunction due to arterial insufficiency: Secondary | ICD-10-CM | POA: Diagnosis not present

## 2017-07-31 MED FILL — XARELTO 20 MG TABLET: 20 | 30 days supply | Qty: 30 | Fill #4

## 2017-08-03 DIAGNOSIS — D649 Anemia, unspecified: Secondary | ICD-10-CM | POA: Diagnosis not present

## 2017-08-03 DIAGNOSIS — E538 Deficiency of other specified B group vitamins: Secondary | ICD-10-CM | POA: Diagnosis not present

## 2017-08-04 DIAGNOSIS — C9000 Multiple myeloma not having achieved remission: Secondary | ICD-10-CM | POA: Diagnosis not present

## 2017-08-04 DIAGNOSIS — Z6826 Body mass index (BMI) 26.0-26.9, adult: Secondary | ICD-10-CM | POA: Diagnosis not present

## 2017-08-04 DIAGNOSIS — D61810 Antineoplastic chemotherapy induced pancytopenia: Secondary | ICD-10-CM | POA: Diagnosis not present

## 2017-08-04 DIAGNOSIS — Z006 Encounter for examination for normal comparison and control in clinical research program: Secondary | ICD-10-CM | POA: Diagnosis not present

## 2017-08-04 DIAGNOSIS — Z7901 Long term (current) use of anticoagulants: Secondary | ICD-10-CM | POA: Diagnosis not present

## 2017-08-18 DIAGNOSIS — H25041 Posterior subcapsular polar age-related cataract, right eye: Secondary | ICD-10-CM | POA: Diagnosis not present

## 2017-08-18 DIAGNOSIS — H25811 Combined forms of age-related cataract, right eye: Secondary | ICD-10-CM | POA: Diagnosis not present

## 2017-08-18 DIAGNOSIS — H2511 Age-related nuclear cataract, right eye: Secondary | ICD-10-CM | POA: Diagnosis not present

## 2017-08-28 MED FILL — XARELTO 20 MG TABLET: 20 | 30 days supply | Qty: 30 | Fill #5

## 2017-09-01 DIAGNOSIS — Z006 Encounter for examination for normal comparison and control in clinical research program: Secondary | ICD-10-CM | POA: Diagnosis not present

## 2017-09-01 DIAGNOSIS — D801 Nonfamilial hypogammaglobulinemia: Secondary | ICD-10-CM | POA: Diagnosis not present

## 2017-09-01 DIAGNOSIS — D61810 Antineoplastic chemotherapy induced pancytopenia: Secondary | ICD-10-CM | POA: Diagnosis not present

## 2017-09-01 DIAGNOSIS — Z6826 Body mass index (BMI) 26.0-26.9, adult: Secondary | ICD-10-CM | POA: Diagnosis not present

## 2017-09-01 DIAGNOSIS — C9000 Multiple myeloma not having achieved remission: Secondary | ICD-10-CM | POA: Diagnosis not present

## 2017-09-22 DIAGNOSIS — H2512 Age-related nuclear cataract, left eye: Secondary | ICD-10-CM | POA: Diagnosis not present

## 2017-09-22 DIAGNOSIS — H25812 Combined forms of age-related cataract, left eye: Secondary | ICD-10-CM | POA: Diagnosis not present

## 2017-09-24 ENCOUNTER — Other Ambulatory Visit: Payer: Self-pay | Admitting: *Deleted

## 2017-09-24 DIAGNOSIS — C9000 Multiple myeloma not having achieved remission: Secondary | ICD-10-CM

## 2017-09-25 ENCOUNTER — Telehealth: Payer: Self-pay | Admitting: Oncology

## 2017-09-25 ENCOUNTER — Inpatient Hospital Stay: Payer: Medicare Other | Attending: Oncology

## 2017-09-25 ENCOUNTER — Inpatient Hospital Stay: Payer: Medicare Other

## 2017-09-25 ENCOUNTER — Inpatient Hospital Stay (HOSPITAL_BASED_OUTPATIENT_CLINIC_OR_DEPARTMENT_OTHER): Payer: Medicare Other | Admitting: Oncology

## 2017-09-25 VITALS — BP 124/60 | HR 59 | Temp 98.0°F | Resp 18 | Ht 70.0 in | Wt 194.0 lb

## 2017-09-25 DIAGNOSIS — Z79899 Other long term (current) drug therapy: Secondary | ICD-10-CM

## 2017-09-25 DIAGNOSIS — Z9221 Personal history of antineoplastic chemotherapy: Secondary | ICD-10-CM | POA: Insufficient documentation

## 2017-09-25 DIAGNOSIS — C9000 Multiple myeloma not having achieved remission: Secondary | ICD-10-CM | POA: Insufficient documentation

## 2017-09-25 DIAGNOSIS — C9001 Multiple myeloma in remission: Secondary | ICD-10-CM

## 2017-09-25 DIAGNOSIS — Z8546 Personal history of malignant neoplasm of prostate: Secondary | ICD-10-CM

## 2017-09-25 LAB — CBC WITH DIFFERENTIAL (CANCER CENTER ONLY)
BASOS PCT: 1 %
Basophils Absolute: 0 10*3/uL (ref 0.0–0.1)
EOS ABS: 0.2 10*3/uL (ref 0.0–0.5)
EOS PCT: 7 %
HCT: 40.8 % (ref 38.4–49.9)
Hemoglobin: 12.8 g/dL — ABNORMAL LOW (ref 13.0–17.1)
LYMPHS ABS: 0.9 10*3/uL (ref 0.9–3.3)
Lymphocytes Relative: 28 %
MCH: 28.1 pg (ref 27.2–33.4)
MCHC: 31.4 g/dL — AB (ref 32.0–36.0)
MCV: 89.5 fL (ref 79.3–98.0)
MONOS PCT: 31 %
Monocytes Absolute: 1 10*3/uL — ABNORMAL HIGH (ref 0.1–0.9)
Neutro Abs: 1 10*3/uL — ABNORMAL LOW (ref 1.5–6.5)
Neutrophils Relative %: 33 %
PLATELETS: 101 10*3/uL — AB (ref 140–400)
RBC: 4.56 MIL/uL (ref 4.20–5.82)
RDW: 17 % — ABNORMAL HIGH (ref 11.0–14.6)
WBC Count: 3.1 10*3/uL — ABNORMAL LOW (ref 4.0–10.3)

## 2017-09-25 LAB — CMP (CANCER CENTER ONLY)
ALBUMIN: 3.1 g/dL — AB (ref 3.5–5.0)
ALK PHOS: 37 U/L — AB (ref 38–126)
ALT: 9 U/L (ref 0–44)
AST: 9 U/L — ABNORMAL LOW (ref 15–41)
Anion gap: 10 (ref 5–15)
BILIRUBIN TOTAL: 0.6 mg/dL (ref 0.3–1.2)
BUN: 9 mg/dL (ref 8–23)
CALCIUM: 8.9 mg/dL (ref 8.9–10.3)
CHLORIDE: 108 mmol/L (ref 98–111)
CO2: 25 mmol/L (ref 22–32)
CREATININE: 0.76 mg/dL (ref 0.61–1.24)
GFR, Est AFR Am: >60 mL/min (ref >60)
GFR, Estimated: >60 mL/min (ref >60)
Glucose, Bld: 95 mg/dL (ref 70–99)
Potassium: 3.7 mmol/L (ref 3.5–5.1)
Sodium: 143 mmol/L (ref 135–145)
Total Protein: 5.7 g/dL — ABNORMAL LOW (ref 6.5–8.1)

## 2017-09-25 MED ORDER — ZOLEDRONIC ACID 4 MG/100ML IV SOLN
4.0000 mg | Freq: Once | INTRAVENOUS | Status: AC
Start: 2017-09-25 — End: 2017-09-25
  Administered 2017-09-25: 4 mg via INTRAVENOUS
  Filled 2017-09-25: qty 100

## 2017-09-25 MED ORDER — SODIUM CHLORIDE 0.9 % IV SOLN
INTRAVENOUS | Status: DC
  Administered 2017-09-25: 10:00:00 via INTRAVENOUS
  Filled 2017-09-25: qty 250

## 2017-09-25 NOTE — Telephone Encounter (Signed)
Appts scheduled AVS/Calendar printed per 8 /30 los

## 2017-09-25 NOTE — Progress Notes (Signed)
Mitchell Lowery   Diagnosis: Multiple myeloma  INTERVAL HISTORY:   Mitchell Lowery returns as scheduled.  He continues treatment on the clinical trial at [facility].  He has intermittent discomfort at the right upper lateral abdomen.  No bleeding.  He is exercising daily.  He has mild lower extremity edema.  Objective:  Vital signs in last 24 hours:  Blood pressure 124/60, pulse (!) 59, temperature 98 F (36.7 C), temperature source Oral, resp. rate 18, height '5\' 10"'  (1.778 m), weight 194 lb (88 kg), SpO2 100 %.    HEENT: No thrush or ulcers Resp: Lungs clear bilaterally Cardio:  regular rate and rhythm GI: No hepatosplenomegaly, no mass, nontender Vascular: Trace edema at the left greater than right lower leg   Lab Results:  Lab Results  Component Value Date   WBC 3.1 (L) 09/25/2017   HGB 12.8 (L) 09/25/2017   HCT 40.8 09/25/2017   MCV 89.5 09/25/2017   PLT 101 (L) 09/25/2017   NEUTROABS 1.0 (L) 09/25/2017    CMP  Lab Results  Component Value Date   NA 142 01/07/2017   K 3.9 01/07/2017   CL 104 08/22/2016   CO2 27 01/07/2017   GLUCOSE 86 01/07/2017   BUN 12.3 01/07/2017   CREATININE 0.9 01/07/2017   CALCIUM 9.1 01/07/2017   PROT 6.7 01/07/2017   PROT 7.5 01/07/2017   ALBUMIN 3.6 01/07/2017   AST 16 01/07/2017   ALT 13 01/07/2017   ALKPHOS 40 01/07/2017   BILITOT 0.65 01/07/2017   GFRNONAA >60 08/22/2016   GFRAA >60 08/22/2016     Medications: I have reviewed the patient's current medications.   Assessment/Plan: 1. Multiple myeloma-confirmed on a bone marrow biopsy 08/07/2014, IgA lambda  Serum M spike and increased serum free lambda light chains  Myeloma FISH panel negative for chromosome 4, 11, 12, 13, 14, and 17 abnormalities, cytogenetics with no metaphases  Bone survey 08/15/2014 with indeterminant lucent skull lesions  Cycle 1 RVD 08/22/2014  Cycle 2 RVD 09/19/2014  Cycle 3 RVD 10/17/2014  Cycle 4 RVD 11/14/2014  (Decadron reduced to 20 mg weekly, Revlimid 15 mg days 1 through 14, Velcade 3/4 weeks)  Cycle 5 RVD 12/12/2014  Serum M spike not detected 12/26/2014  Cycle 6 RVD 01/09/2015  Maintenance Revlimid, 10 mg daily, 02/05/2015 , discontinue 02/26/2015 secondary to leg edema and diarrhea  Revlimid resumed at a dose of 10 mg every other day beginning 03/13/2015  PET scan 32/20/2542-[facility] hypermetabolic bone lesions, few lucent lesions in the spine and pelvis  Revlimid discontinued January 2019  Enrollment on a clinical trial at [facility] with pomalidomide/Decadron+/- ixazomibbeginning 03/10/2017  2. 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 History of intermittent prostatitis  4. Skin rash 10/24/2014-referred to dermatology, biopsy consistent with local hypersensitivity reaction  5. 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 Left lower leg cellulitis 06/24/2015-treated with Keflex  7. History of mild thrombocytopenia secondary to myeloma and systemic therapy  8. History of mild neutropenia- secondary to myeloma and systemic therapy  9. Fall with a skull fracture and subarachnoid blood/right frontal lobe contusions 10/17/2015  10. Right lower lobe pulmonary embolus 03/28/2016. Lovenox , transitioned to Xarelto  11. Vesicular rashJune 2018-potentially related to the zoster vaccine, resolved     Disposition: Mitchell Lowery appears  well.  He will continue treatment on study at [facility] with pomalidomide/Decadron/ixazomib.  He will obtain an influenza vaccine within the next 1-2 months.  He will receive Zometa today.  The IgA level and lambda light chains were normal when checked at [facility] on 09/01/2017.  15 minutes were  spent with the patient today.  The majority of the time was used for counseling and coordination of care.  Mitchell Coder, MD  09/25/2017  8:50 AM

## 2017-09-25 NOTE — Patient Instructions (Signed)

## 2017-09-29 DIAGNOSIS — Z006 Encounter for examination for normal comparison and control in clinical research program: Secondary | ICD-10-CM | POA: Diagnosis not present

## 2017-09-29 DIAGNOSIS — Z7901 Long term (current) use of anticoagulants: Secondary | ICD-10-CM | POA: Diagnosis not present

## 2017-09-29 DIAGNOSIS — D61818 Other pancytopenia: Secondary | ICD-10-CM | POA: Diagnosis not present

## 2017-09-29 DIAGNOSIS — R972 Elevated prostate specific antigen [PSA]: Secondary | ICD-10-CM | POA: Diagnosis not present

## 2017-09-29 DIAGNOSIS — K9289 Other specified diseases of the digestive system: Secondary | ICD-10-CM | POA: Diagnosis not present

## 2017-09-29 DIAGNOSIS — C9000 Multiple myeloma not having achieved remission: Secondary | ICD-10-CM | POA: Diagnosis not present

## 2017-09-29 DIAGNOSIS — R0781 Pleurodynia: Secondary | ICD-10-CM | POA: Diagnosis not present

## 2017-09-29 DIAGNOSIS — C9002 Multiple myeloma in relapse: Secondary | ICD-10-CM | POA: Diagnosis not present

## 2017-09-29 DIAGNOSIS — Z79899 Other long term (current) drug therapy: Secondary | ICD-10-CM | POA: Diagnosis not present

## 2017-09-29 DIAGNOSIS — C9001 Multiple myeloma in remission: Secondary | ICD-10-CM | POA: Diagnosis not present

## 2017-09-29 DIAGNOSIS — D801 Nonfamilial hypogammaglobulinemia: Secondary | ICD-10-CM | POA: Diagnosis not present

## 2017-09-29 DIAGNOSIS — Z6826 Body mass index (BMI) 26.0-26.9, adult: Secondary | ICD-10-CM | POA: Diagnosis not present

## 2017-09-29 DIAGNOSIS — Z7952 Long term (current) use of systemic steroids: Secondary | ICD-10-CM | POA: Diagnosis not present

## 2017-09-29 DIAGNOSIS — R5383 Other fatigue: Secondary | ICD-10-CM | POA: Diagnosis not present

## 2017-09-29 DIAGNOSIS — D61810 Antineoplastic chemotherapy induced pancytopenia: Secondary | ICD-10-CM | POA: Diagnosis not present

## 2017-09-29 DIAGNOSIS — Z923 Personal history of irradiation: Secondary | ICD-10-CM | POA: Diagnosis not present

## 2017-09-29 DIAGNOSIS — Z86711 Personal history of pulmonary embolism: Secondary | ICD-10-CM | POA: Diagnosis not present

## 2017-09-29 DIAGNOSIS — I4891 Unspecified atrial fibrillation: Secondary | ICD-10-CM | POA: Diagnosis not present

## 2017-09-29 MED FILL — XARELTO 20 MG TABLET: 20 | 30 days supply | Qty: 30 | Fill #6

## 2017-10-08 DIAGNOSIS — Z23 Encounter for immunization: Secondary | ICD-10-CM | POA: Diagnosis not present

## 2017-10-27 DIAGNOSIS — D801 Nonfamilial hypogammaglobulinemia: Secondary | ICD-10-CM | POA: Diagnosis not present

## 2017-10-27 DIAGNOSIS — Z6826 Body mass index (BMI) 26.0-26.9, adult: Secondary | ICD-10-CM | POA: Diagnosis not present

## 2017-10-27 DIAGNOSIS — K5900 Constipation, unspecified: Secondary | ICD-10-CM | POA: Diagnosis not present

## 2017-10-27 DIAGNOSIS — I4891 Unspecified atrial fibrillation: Secondary | ICD-10-CM | POA: Diagnosis not present

## 2017-10-27 DIAGNOSIS — R5383 Other fatigue: Secondary | ICD-10-CM | POA: Diagnosis not present

## 2017-10-27 DIAGNOSIS — R197 Diarrhea, unspecified: Secondary | ICD-10-CM | POA: Diagnosis not present

## 2017-10-27 DIAGNOSIS — C9001 Multiple myeloma in remission: Secondary | ICD-10-CM | POA: Diagnosis not present

## 2017-10-27 DIAGNOSIS — D61818 Other pancytopenia: Secondary | ICD-10-CM | POA: Diagnosis not present

## 2017-10-27 DIAGNOSIS — Z79899 Other long term (current) drug therapy: Secondary | ICD-10-CM | POA: Diagnosis not present

## 2017-10-27 DIAGNOSIS — Z006 Encounter for examination for normal comparison and control in clinical research program: Secondary | ICD-10-CM | POA: Diagnosis not present

## 2017-10-27 DIAGNOSIS — T451X5A Adverse effect of antineoplastic and immunosuppressive drugs, initial encounter: Secondary | ICD-10-CM | POA: Diagnosis not present

## 2017-10-27 DIAGNOSIS — Z7901 Long term (current) use of anticoagulants: Secondary | ICD-10-CM | POA: Diagnosis not present

## 2017-10-27 DIAGNOSIS — I2699 Other pulmonary embolism without acute cor pulmonale: Secondary | ICD-10-CM | POA: Diagnosis not present

## 2017-10-27 DIAGNOSIS — D61810 Antineoplastic chemotherapy induced pancytopenia: Secondary | ICD-10-CM | POA: Diagnosis not present

## 2017-10-29 MED FILL — XARELTO 20 MG TABLET: 20 | 30 days supply | Qty: 30 | Fill #7

## 2017-11-11 DIAGNOSIS — H903 Sensorineural hearing loss, bilateral: Secondary | ICD-10-CM | POA: Insufficient documentation

## 2017-11-24 DIAGNOSIS — C9001 Multiple myeloma in remission: Secondary | ICD-10-CM | POA: Diagnosis not present

## 2017-11-24 DIAGNOSIS — Z6827 Body mass index (BMI) 27.0-27.9, adult: Secondary | ICD-10-CM | POA: Diagnosis not present

## 2017-11-24 DIAGNOSIS — D61810 Antineoplastic chemotherapy induced pancytopenia: Secondary | ICD-10-CM | POA: Diagnosis not present

## 2017-11-24 DIAGNOSIS — D801 Nonfamilial hypogammaglobulinemia: Secondary | ICD-10-CM | POA: Diagnosis not present

## 2017-11-24 DIAGNOSIS — Z006 Encounter for examination for normal comparison and control in clinical research program: Secondary | ICD-10-CM | POA: Diagnosis not present

## 2017-11-26 DIAGNOSIS — N400 Enlarged prostate without lower urinary tract symptoms: Secondary | ICD-10-CM | POA: Diagnosis not present

## 2017-11-26 DIAGNOSIS — I10 Essential (primary) hypertension: Secondary | ICD-10-CM | POA: Diagnosis not present

## 2017-11-26 DIAGNOSIS — C61 Malignant neoplasm of prostate: Secondary | ICD-10-CM | POA: Diagnosis not present

## 2017-11-26 DIAGNOSIS — D61818 Other pancytopenia: Secondary | ICD-10-CM | POA: Diagnosis not present

## 2017-11-26 DIAGNOSIS — D649 Anemia, unspecified: Secondary | ICD-10-CM | POA: Diagnosis not present

## 2017-11-27 MED FILL — XARELTO 20 MG TABLET: 20 | 30 days supply | Qty: 30 | Fill #8

## 2017-12-14 ENCOUNTER — Other Ambulatory Visit: Payer: Self-pay | Admitting: *Deleted

## 2017-12-14 ENCOUNTER — Telehealth: Payer: Self-pay | Admitting: Oncology

## 2017-12-14 DIAGNOSIS — C9000 Multiple myeloma not having achieved remission: Secondary | ICD-10-CM

## 2017-12-14 NOTE — Telephone Encounter (Signed)
Scheduled appt per 11/18 sch message - unable to reach pt.

## 2017-12-14 NOTE — Progress Notes (Signed)
Call from pharmacy to alert MD that patient may need labs on 12/16/17 prior to his Zometa. Labs ordered and scheduling message sent.

## 2017-12-16 ENCOUNTER — Inpatient Hospital Stay: Payer: Medicare Other | Attending: Oncology | Admitting: Oncology

## 2017-12-16 ENCOUNTER — Telehealth: Payer: Self-pay | Admitting: Oncology

## 2017-12-16 ENCOUNTER — Inpatient Hospital Stay: Payer: Medicare Other

## 2017-12-16 ENCOUNTER — Other Ambulatory Visit: Payer: Self-pay | Admitting: *Deleted

## 2017-12-16 VITALS — BP 134/68 | HR 72 | Temp 98.0°F | Resp 18 | Ht 70.0 in | Wt 197.1 lb

## 2017-12-16 DIAGNOSIS — Z923 Personal history of irradiation: Secondary | ICD-10-CM | POA: Insufficient documentation

## 2017-12-16 DIAGNOSIS — C9001 Multiple myeloma in remission: Secondary | ICD-10-CM

## 2017-12-16 DIAGNOSIS — Z8546 Personal history of malignant neoplasm of prostate: Secondary | ICD-10-CM | POA: Diagnosis not present

## 2017-12-16 DIAGNOSIS — C9000 Multiple myeloma not having achieved remission: Secondary | ICD-10-CM

## 2017-12-16 DIAGNOSIS — Z79899 Other long term (current) drug therapy: Secondary | ICD-10-CM | POA: Insufficient documentation

## 2017-12-16 DIAGNOSIS — Z9221 Personal history of antineoplastic chemotherapy: Secondary | ICD-10-CM

## 2017-12-16 LAB — CMP (CANCER CENTER ONLY)
ALBUMIN: 3.3 g/dL — AB (ref 3.5–5.0)
ALK PHOS: 42 U/L (ref 38–126)
ALT: 12 U/L (ref 0–44)
AST: 13 U/L — AB (ref 15–41)
Anion gap: 10 (ref 5–15)
BILIRUBIN TOTAL: 0.7 mg/dL (ref 0.3–1.2)
BUN: 12 mg/dL (ref 8–23)
CALCIUM: 9.5 mg/dL (ref 8.9–10.3)
CO2: 23 mmol/L (ref 22–32)
Chloride: 109 mmol/L (ref 98–111)
Creatinine: 0.87 mg/dL (ref 0.61–1.24)
GFR, Est AFR Am: >60 mL/min (ref >60)
GFR, Estimated: >60 mL/min (ref >60)
GLUCOSE: 212 mg/dL — AB (ref 70–99)
POTASSIUM: 4 mmol/L (ref 3.5–5.1)
Sodium: 142 mmol/L (ref 135–145)
TOTAL PROTEIN: 6.5 g/dL (ref 6.5–8.1)

## 2017-12-16 LAB — CBC WITH DIFFERENTIAL (CANCER CENTER ONLY)
Abs Immature Granulocytes: 0.02 10*3/uL (ref 0.00–0.07)
BASOS ABS: 0 10*3/uL (ref 0.0–0.1)
Basophils Relative: 1 %
Eosinophils Absolute: 0 10*3/uL (ref 0.0–0.5)
Eosinophils Relative: 1 %
HEMATOCRIT: 44.4 % (ref 39.0–52.0)
HEMOGLOBIN: 14.1 g/dL (ref 13.0–17.0)
IMMATURE GRANULOCYTES: 1 %
LYMPHS ABS: 0.8 10*3/uL (ref 0.7–4.0)
LYMPHS PCT: 29 %
MCH: 28.3 pg (ref 26.0–34.0)
MCHC: 31.8 g/dL (ref 30.0–36.0)
MCV: 89.2 fL (ref 80.0–100.0)
MONO ABS: 0.1 10*3/uL (ref 0.1–1.0)
Monocytes Relative: 2 %
NEUTROS ABS: 1.9 10*3/uL (ref 1.7–7.7)
Neutrophils Relative %: 66 %
Platelet Count: 103 10*3/uL — ABNORMAL LOW (ref 150–400)
RBC: 4.98 MIL/uL (ref 4.22–5.81)
RDW: 15.6 % — ABNORMAL HIGH (ref 11.5–15.5)
WBC Count: 2.9 10*3/uL — ABNORMAL LOW (ref 4.0–10.5)
nRBC: 0 % (ref 0.0–0.2)

## 2017-12-16 MED ORDER — SODIUM CHLORIDE 0.9 % IV SOLN
INTRAVENOUS | Status: DC
  Administered 2017-12-16: 10:00:00 via INTRAVENOUS
  Filled 2017-12-16: qty 250

## 2017-12-16 MED ORDER — SENNOSIDES-DOCUSATE SODIUM 8.6-50 MG PO TABS: 2.0000 | ORAL_TABLET | Freq: Every day | ORAL | Status: DC

## 2017-12-16 MED ORDER — ZOLEDRONIC ACID 4 MG/100ML IV SOLN
4.0000 mg | Freq: Once | INTRAVENOUS | Status: AC
  Administered 2017-12-16: 4 mg via INTRAVENOUS
  Filled 2017-12-16: qty 100

## 2017-12-16 NOTE — Progress Notes (Signed)
[place] OFFICE PROGRESS NOTE   Diagnosis: Multiple myeloma  INTERVAL HISTORY:   Mitchell Lowery continues treatment on study at [facility].  He developed 2 skin ulcers left lower leg.  1 of the ulcers has healed.  The other is healing.  He is using a topical antibiotic.  He has abdominal pain with certain positions.  No back pain.  No nausea.  He continues to have irregular bowel habits.  No difficulty with urination. He is a concerned about weight gain and fat redistribution while on weekly Decadron. Objective:  Vital signs in last 24 hours:  Blood pressure 134/68, pulse 72, temperature 98 F (36.7 C), temperature source Oral, resp. rate 18, height '5\' 10"'  (1.778 m), weight 197 lb 1.6 oz (89.4 kg), SpO2 95 %.    HEENT: No thrush or ulcers Resp: Lungs clear bilaterally Cardio: Regular rhythm, 2/6 systolic murmur GI: Nontender, no mass, no hepatosplenomegaly Vascular: Bilateral support stockings in place, trace edema at the left lower leg  Skin: Healed ulceration at the lateral right lower leg, 1 cm flesh-colored provisional ulcer at the left lower leg    Lab Results:  Lab Results  Component Value Date   WBC 2.9 (L) 12/16/2017   HGB 14.1 12/16/2017   HCT 44.4 12/16/2017   MCV 89.2 12/16/2017   PLT 103 (L) 12/16/2017   NEUTROABS 1.9 12/16/2017    CMP  Lab Results  Component Value Date   NA 143 09/25/2017   K 3.7 09/25/2017   CL 108 09/25/2017   CO2 25 09/25/2017   GLUCOSE 95 09/25/2017   BUN 9 09/25/2017   CREATININE 0.76 09/25/2017   CALCIUM 8.9 09/25/2017   PROT 5.7 (L) 09/25/2017   ALBUMIN 3.1 (L) 09/25/2017   AST 9 (L) 09/25/2017   ALT 9 09/25/2017   ALKPHOS 37 (L) 09/25/2017   BILITOT 0.6 09/25/2017   GFRNONAA >60 09/25/2017   GFRAA >60 09/25/2017     Medications: I have reviewed the patient's current medications.   Assessment/Plan: 1. Multiple myeloma-confirmed on a bone marrow biopsy 08/07/2014, IgA lambda  Serum M spike and increased  serum free lambda light chains  Myeloma FISH panel negative for chromosome 4, 11, 12, 13, 14, and 17 abnormalities, cytogenetics with no metaphases  Bone survey 08/15/2014 with indeterminant lucent skull lesions  Cycle 1 RVD 08/22/2014  Cycle 2 RVD 09/19/2014  Cycle 3 RVD 10/17/2014  Cycle 4 RVD 11/14/2014 (Decadron reduced to 20 mg weekly, Revlimid 15 mg days 1 through 14, Velcade 3/4 weeks)  Cycle 5 RVD 12/12/2014  Serum M spike not detected 12/26/2014  Cycle 6 RVD 01/09/2015  Maintenance Revlimid, 10 mg daily, 02/05/2015 , discontinue 02/26/2015 secondary to leg edema and diarrhea  Revlimid resumed at a dose of 10 mg every other day beginning 03/13/2015  PET scan 27/74/1287-OM hypermetabolic bone lesions, few lucent lesions in the spine and pelvis  Revlimid discontinued January 2019  Enrollment on a clinical trial at [facility] with pomalidomide/Decadron+/- ixazomibbeginning 03/10/2017  2. 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 History of intermittent prostatitis  4. Skin rash 10/24/2014-referred to dermatology, biopsy consistent with local hypersensitivity reaction  5. 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 Left lower leg cellulitis 06/24/2015-treated with Keflex  7. History of mild thrombocytopenia secondary tomyeloma and systemic therapy  8.  History of mild neutropenia-secondary to myeloma and systemic therapy  9. Fall with a skull fracture and subarachnoid blood/right frontal lobe contusions 10/17/2015  10. Right lower lobe pulmonary embolus 03/28/2016. Lovenox , transitioned to Xarelto  11. Vesicular rashJune 2018-potentially related to the zoster vaccine,  resolved     Disposition: Mitchell Lowery appears stable.  He continues treatment on study at [facility].  He has achieved a very good partial response.  He will continue close follow-up with the myeloma team at [facility].  He plans to discuss a decrease of the Decadron dose.  He will continue a topical antibiotic for the left leg ulcer.  He will seek medical attention if the ulcer does not heal.  I have a low clinical suspicion for a skin cancer.  He continues anticoagulation therapy.  He will receive Zometa today.  Mitchell Lowery will return for an office visit and Zometa in 3 months.  I am available to see him in the interim as needed.  [name], MD  12/16/2017  9:11 AM

## 2017-12-16 NOTE — Patient Instructions (Signed)
[facility] Discharge Instructions for Patients Receiving Chemotherapy  Today you received the following chemotherapy agents Zoledronic Acid (ZOMETA).  To help prevent nausea and vomiting after your treatment, we encourage you to take your nausea medication as prescribed.  If you develop nausea and vomiting that is not controlled by your nausea medication, call the clinic.   BELOW ARE SYMPTOMS THAT SHOULD BE REPORTED IMMEDIATELY:  *FEVER GREATER THAN 100.5 F  *CHILLS WITH OR WITHOUT FEVER  NAUSEA AND VOMITING THAT IS NOT CONTROLLED WITH YOUR NAUSEA MEDICATION  *UNUSUAL SHORTNESS OF BREATH  *UNUSUAL BRUISING OR BLEEDING  TENDERNESS IN MOUTH AND THROAT WITH OR WITHOUT PRESENCE OF ULCERS  *URINARY PROBLEMS  *BOWEL PROBLEMS  UNUSUAL RASH Items with * indicate a potential emergency and should be followed up as soon as possible.  Feel free to call the clinic should you have any questions or concerns. The clinic phone number is [phone].  Please show the CHEMO ALERT CARD at check-in to the Emergency Department and triage nurse.   

## 2017-12-16 NOTE — Telephone Encounter (Signed)
Gave pt avs and calendar  °

## 2017-12-22 DIAGNOSIS — D801 Nonfamilial hypogammaglobulinemia: Secondary | ICD-10-CM | POA: Diagnosis not present

## 2017-12-22 DIAGNOSIS — Z6827 Body mass index (BMI) 27.0-27.9, adult: Secondary | ICD-10-CM | POA: Diagnosis not present

## 2017-12-22 DIAGNOSIS — D61810 Antineoplastic chemotherapy induced pancytopenia: Secondary | ICD-10-CM | POA: Diagnosis not present

## 2017-12-22 DIAGNOSIS — C9001 Multiple myeloma in remission: Secondary | ICD-10-CM | POA: Diagnosis not present

## 2017-12-22 DIAGNOSIS — R0609 Other forms of dyspnea: Secondary | ICD-10-CM | POA: Diagnosis not present

## 2017-12-22 DIAGNOSIS — Z006 Encounter for examination for normal comparison and control in clinical research program: Secondary | ICD-10-CM | POA: Diagnosis not present

## 2017-12-28 MED FILL — XARELTO 20 MG TABLET: 20 | 30 days supply | Qty: 30 | Fill #9

## 2018-01-26 DIAGNOSIS — C9001 Multiple myeloma in remission: Secondary | ICD-10-CM | POA: Diagnosis not present

## 2018-01-26 DIAGNOSIS — D801 Nonfamilial hypogammaglobulinemia: Secondary | ICD-10-CM | POA: Diagnosis not present

## 2018-01-26 DIAGNOSIS — R0781 Pleurodynia: Secondary | ICD-10-CM | POA: Diagnosis not present

## 2018-01-26 DIAGNOSIS — D61810 Antineoplastic chemotherapy induced pancytopenia: Secondary | ICD-10-CM | POA: Diagnosis not present

## 2018-01-26 DIAGNOSIS — Z79899 Other long term (current) drug therapy: Secondary | ICD-10-CM | POA: Diagnosis not present

## 2018-01-26 DIAGNOSIS — Z006 Encounter for examination for normal comparison and control in clinical research program: Secondary | ICD-10-CM | POA: Diagnosis not present

## 2018-01-26 DIAGNOSIS — Z6826 Body mass index (BMI) 26.0-26.9, adult: Secondary | ICD-10-CM | POA: Diagnosis not present

## 2018-01-26 DIAGNOSIS — C9002 Multiple myeloma in relapse: Secondary | ICD-10-CM | POA: Diagnosis not present

## 2018-01-28 MED FILL — XARELTO 20 MG TABLET: 20 | 30 days supply | Qty: 30 | Fill #10

## 2018-02-10 DIAGNOSIS — C61 Malignant neoplasm of prostate: Secondary | ICD-10-CM | POA: Diagnosis not present

## 2018-02-17 DIAGNOSIS — N5201 Erectile dysfunction due to arterial insufficiency: Secondary | ICD-10-CM | POA: Diagnosis not present

## 2018-02-17 DIAGNOSIS — N400 Enlarged prostate without lower urinary tract symptoms: Secondary | ICD-10-CM | POA: Diagnosis not present

## 2018-02-17 DIAGNOSIS — C61 Malignant neoplasm of prostate: Secondary | ICD-10-CM | POA: Diagnosis not present

## 2018-02-18 ENCOUNTER — Telehealth: Payer: Self-pay | Admitting: Oncology

## 2018-02-18 NOTE — Telephone Encounter (Signed)
[name] pal, moved 02/20 to 02/24. Left patient a message regarding rescheduled appointment. Mailed out new schedule.

## 2018-02-23 DIAGNOSIS — Z9221 Personal history of antineoplastic chemotherapy: Secondary | ICD-10-CM | POA: Diagnosis not present

## 2018-02-23 DIAGNOSIS — H903 Sensorineural hearing loss, bilateral: Secondary | ICD-10-CM | POA: Diagnosis not present

## 2018-02-23 DIAGNOSIS — I4891 Unspecified atrial fibrillation: Secondary | ICD-10-CM | POA: Diagnosis not present

## 2018-02-23 DIAGNOSIS — R001 Bradycardia, unspecified: Secondary | ICD-10-CM | POA: Diagnosis not present

## 2018-02-23 DIAGNOSIS — D801 Nonfamilial hypogammaglobulinemia: Secondary | ICD-10-CM | POA: Diagnosis not present

## 2018-02-23 DIAGNOSIS — Z6826 Body mass index (BMI) 26.0-26.9, adult: Secondary | ICD-10-CM | POA: Diagnosis not present

## 2018-02-23 DIAGNOSIS — I2699 Other pulmonary embolism without acute cor pulmonale: Secondary | ICD-10-CM | POA: Diagnosis not present

## 2018-02-23 DIAGNOSIS — C9001 Multiple myeloma in remission: Secondary | ICD-10-CM | POA: Diagnosis not present

## 2018-02-23 DIAGNOSIS — D61810 Antineoplastic chemotherapy induced pancytopenia: Secondary | ICD-10-CM | POA: Diagnosis not present

## 2018-02-23 DIAGNOSIS — Z006 Encounter for examination for normal comparison and control in clinical research program: Secondary | ICD-10-CM | POA: Diagnosis not present

## 2018-02-23 DIAGNOSIS — C9000 Multiple myeloma not having achieved remission: Secondary | ICD-10-CM | POA: Diagnosis not present

## 2018-02-23 DIAGNOSIS — Z79899 Other long term (current) drug therapy: Secondary | ICD-10-CM | POA: Diagnosis not present

## 2018-02-24 ENCOUNTER — Ambulatory Visit (INDEPENDENT_AMBULATORY_CARE_PROVIDER_SITE_OTHER): Payer: Medicare Other | Admitting: Cardiovascular Disease

## 2018-02-24 ENCOUNTER — Encounter: Payer: Self-pay | Admitting: Cardiovascular Disease

## 2018-02-24 VITALS — BP 132/70 | HR 65 | Ht 70.0 in | Wt 190.2 lb

## 2018-02-24 DIAGNOSIS — I480 Paroxysmal atrial fibrillation: Secondary | ICD-10-CM

## 2018-02-24 DIAGNOSIS — I350 Nonrheumatic aortic (valve) stenosis: Secondary | ICD-10-CM

## 2018-02-24 NOTE — Patient Instructions (Signed)
Medication Instructions:  Your provider recommends that you continue on your current medications as directed. Please refer to the Current Medication list given to you today.    Labwork: None  Testing/Procedures: Dr. Burt Knack recommends you wear a ZIO PATCH MONITOR for 2 weeks.  Follow-Up: You will be called to arrange a 6 month echocardiogram and appointment with Dr. Burt Knack when his schedule is open.

## 2018-02-24 NOTE — Progress Notes (Signed)
Cardiology Office Note:    Date:  02/24/2018   ID:  ORY ELTING, DOB 11/22/1934, MRN [id number]  PCP:  [name], MD  Cardiologist:  Sherren Mocha, MD  Electrophysiologist:  None   Referring MD: [name], MD   Chief Complaint  Patient presents with  . Shortness of Breath    History of Present Illness:    Mitchell Lowery is a 84 y.o. male with a hx of paroxysmal atrial fibrillation, presenting for follow-up evaluation after calling in with symptomatic atrial fibrillation.  The patient has been followed closely for multiple myeloma.  He is involved in a research trial at [facility] and undergoes close surveillance.  He also keeps close track of his blood pressure and pulse rate.  After his last oral treatment, which he takes on Tuesdays, he developed a more rapid heart rate.  He states that he feels very poorly with this and notes shortness of breath, marked fatigue, and generalized weakness.  He brings in heart rate readings in the 90s to low 100s.  This is much higher than his baseline heart rate which is generally in the 50s.  He denies chest pain or pressure.  He denies syncope.  He otherwise has felt well and has no specific complaints.  When he was recently evaluated at [facility] an EKG was performed which demonstrated marked sinus bradycardia with a heart rate of 41 bpm.  Past Medical History:  Diagnosis Date  . Anal fistula    treated in [place]  . Anemia    only due to myeloma- "normal now"  . Heart murmur   . Hemorrhoids   . Multiple myeloma (HCC)    1 month remission now-last tx. 1 month ago- /[name]  . Prostate cancer [facility])    urinary retention, surgery planned-prior radiation, and seed implant about 1 year ago.  . Prostatitis   . Pulmonary embolus, left ([place]) 03/28/2016  . UTI (lower urinary tract infection)    multiple urinary trract infection- being tx presently with antibiotic at present.    Past Surgical History:  Procedure Laterality Date    . ANAL FISTULECTOMY    . ANKLE SURGERY    . CYSTOSCOPY    . CYSTOSCOPY WITH INSERTION OF UROLIFT N/A 03/12/2015   Procedure: CYSTOSCOPY WITH INSERTION OF UROLIFT;  Surgeon: Franchot Gallo, MD;  Location: [facility];  Service: Urology;  Laterality: N/A;  . PROSTATE BIOPSY    . PROSTATE BIOPSY    . RADIOACTIVE SEED IMPLANT N/A 07/21/2014   Procedure: RADIOACTIVE SEED IMPLANT/BRACHYTHERAPY IMPLANT;  Surgeon: Franchot Gallo, MD;  Location: [facility];  Service: Urology;  Laterality: N/A;  . TONSILLECTOMY      Current Medications: Current Meds  Medication Sig  . Cholecalciferol 2000 units TABS Take 1 tablet by mouth daily.  Marland Kitchen dexamethasone (DECADRON) 4 MG tablet Take 20 mg by mouth once a week.  . diphenoxylate-atropine (LOMOTIL) 2.5-0.025 MG tablet Take 1 tablet by mouth daily as needed for diarrhea or loose stools. Usually takes 1 tablet a week  . IXAZOMIB CITRATE PO Take 4 mg by mouth once a week. 3 weeks on, 1 week off  . losartan (COZAAR) 50 MG tablet Take 50 mg by mouth daily.   . pomalidomide (POMALYST) 4 MG capsule 4 mg. TAKE 4 MG BY MOUTH FOR DAILY FOR 3 WEEKS THEN 1 WEEK OFF  . Psyllium (QC NATURAL VEGETABLE) 95 % POWD Take by mouth.  . senna-docusate (SENNA S) 8.6-50 MG tablet Take  2 tablets by mouth daily.  . valACYclovir (VALTREX) 500 MG tablet   . vitamin B-12 (CYANOCOBALAMIN) 500 MCG tablet Take 500 mcg by mouth daily.  Alveda Reasons 20 MG TABS tablet TAKE 1 TABLET BY MOUTH DAILY WITH SUPPER.     Allergies:   Patient has no known allergies.   Social History   Socioeconomic History  . Marital status: Unknown    Spouse name: Not on file  . Number of children: Not on file  . Years of education: Not on file  . Highest education level: Not on file  Occupational History  . Not on file  Social Needs  . Financial resource strain: Not on file  . Food insecurity:    Worry: Not on file    Inability: Not on file  . Transportation needs:    Medical: Not on  file    Non-medical: Not on file  Tobacco Use  . Smoking status: Never Smoker  . Smokeless tobacco: Never Used  Substance and Sexual Activity  . Alcohol use: Yes    Comment: 1 glass wine daily  . Drug use: No  . Sexual activity: Yes  Lifestyle  . Physical activity:    Days per week: Not on file    Minutes per session: Not on file  . Stress: Not on file  Relationships  . Social connections:    Talks on phone: Not on file    Gets together: Not on file    Attends religious service: Not on file    Active member of club or organization: Not on file    Attends meetings of clubs or organizations: Not on file    Relationship status: Not on file  Other Topics Concern  . Not on file  Social History Narrative  . Not on file     Family History: The patient's family history includes Cancer in his maternal grandfather.  ROS:   Please see the history of present illness.    Positive for diarrhea, muscle pain, constipation.  All other systems reviewed and are negative.  EKGs/Labs/Other Studies Reviewed:    The following studies were reviewed today: Echocardiogram June 30, 2017: Study Conclusions  - Left ventricle: The cavity size was normal. Wall thickness was   increased in a pattern of moderate LVH. Systolic function was   normal. The estimated ejection fraction was in the range of 60%   to 65%. Wall motion was normal; there were no regional wall   motion abnormalities. The study is not technically sufficient to   allow evaluation of LV diastolic function. - Aortic valve: Calcified leaflets with moderate stenosis. Mean   gradient (S): 26 mm Hg. Peak gradient (S): 55 mm Hg. Valve area   (VTI): 1.17 cm^2. Valve area (Vmean): 1.16 cm^2. - Mitral valve: Calcified annulus. Mildly thickened leaflets .   There was trivial regurgitation. - Left atrium: The atrium was mildly dilated. - Right atrium: The atrium was mildly dilated. - Atrial septum: Aneurysmal IAS- cannot exclude PFO. -  Tricuspid valve: There was mild regurgitation. - Pulmonary arteries: PA peak pressure: 25 mm Hg (S). - Inferior vena cava: The vessel was normal in size. The   respirophasic diameter changes were in the normal range (>= 50%),   consistent with normal central venous pressure.  Impressions:  - Compared to a prior study in 2017, the LVEF is higher at 60-65%.   There is now moderate aortic stenosis with a mean gradient of 26   mmHg and AVA  around 1.2 cm2.  EKG:  EKG is not ordered today.    Recent Labs: 12/16/2017: ALT 12; BUN 12; Creatinine 0.87; Hemoglobin 14.1; Platelet Count 103; Potassium 4.0; Sodium 142  Recent Lipid Panel No results found for: CHOL, TRIG, HDL, CHOLHDL, VLDL, LDLCALC, LDLDIRECT  Physical Exam:    VS:  BP 132/70   Pulse 65   Ht '5\' 10"'  (1.778 m)   Wt 190 lb 3.2 oz (86.3 kg)   SpO2 96%   BMI 27.29 kg/m     Wt Readings from Last 3 Encounters:  02/24/18 190 lb 3.2 oz (86.3 kg)  12/16/17 197 lb 1.6 oz (89.4 kg)  09/25/17 194 lb (88 kg)     GEN:  Well nourished, well developed pleasant elderly male in no acute distress HEENT: Normal NECK: No JVD; No carotid bruits LYMPHATICS: No lymphadenopathy CARDIAC: RRR, 2/6 harsh mid peaking crescendo decrescendo murmur at the right upper sternal border RESPIRATORY:  Clear to auscultation without rales, wheezing or rhonchi  ABDOMEN: Soft, non-tender, non-distended MUSCULOSKELETAL:  No edema; No deformity  SKIN: Warm and dry NEUROLOGIC:  Alert and oriented x 3 PSYCHIATRIC:  Normal affect   ASSESSMENT:    1. Paroxysmal atrial fibrillation (HCC)   2. Nonrheumatic aortic valve stenosis    PLAN:    In order of problems listed above:  1. The patient is likely experiencing symptomatic paroxysms of atrial fibrillation.  I have recommended outpatient telemetry with a ZIO Patch to better evaluate his heart rhythm.  I think her treatment options are fairly limited because of his marked bradycardia at rest.  He is  tolerating oral anticoagulation with rivaroxaban.  If he has more frequent atrial fibrillation I may refer him for formal electrophysiology evaluation.  We also discussed consideration of reducing his dexamethasone as he feels his increased heart rate corresponds to taking this medicine.  I will forward my note to his hematologist at [facility] to see if reducing his dexamethasone dose is a reasonable consideration. 2. The patient has moderate aortic stenosis.  His exam is stable.  When I see him back in 6 months I will repeat an echocardiogram.   Medication Adjustments/Labs and Tests Ordered: Current medicines are reviewed at length with the patient today.  Concerns regarding medicines are outlined above.  Orders Placed This Encounter  Procedures  . LONG TERM MONITOR (3-14 DAYS)   No orders of the defined types were placed in this encounter.   Patient Instructions  Medication Instructions:  Your provider recommends that you continue on your current medications as directed. Please refer to the Current Medication list given to you today.    Labwork: None  Testing/Procedures: Dr. Burt Knack recommends you wear a ZIO PATCH MONITOR for 2 weeks.  Follow-Up: You will be called to arrange a 6 month echocardiogram and appointment with Dr. Burt Knack when his schedule is open.      Signed, Sherren Mocha, MD  02/24/2018 5:37 PM    [place]

## 2018-02-26 MED FILL — XARELTO 20 MG TABLET: 20 | 30 days supply | Qty: 30 | Fill #11

## 2018-03-05 ENCOUNTER — Ambulatory Visit (INDEPENDENT_AMBULATORY_CARE_PROVIDER_SITE_OTHER): Payer: Medicare Other

## 2018-03-05 DIAGNOSIS — I480 Paroxysmal atrial fibrillation: Secondary | ICD-10-CM

## 2018-03-18 ENCOUNTER — Ambulatory Visit: Payer: Medicare Other

## 2018-03-18 ENCOUNTER — Other Ambulatory Visit: Payer: Medicare Other

## 2018-03-18 ENCOUNTER — Ambulatory Visit: Payer: Medicare Other | Admitting: Oncology

## 2018-03-22 ENCOUNTER — Inpatient Hospital Stay: Payer: Medicare Other | Attending: Oncology

## 2018-03-22 ENCOUNTER — Inpatient Hospital Stay (HOSPITAL_BASED_OUTPATIENT_CLINIC_OR_DEPARTMENT_OTHER): Payer: Medicare Other | Admitting: Oncology

## 2018-03-22 ENCOUNTER — Telehealth: Payer: Self-pay | Admitting: Oncology

## 2018-03-22 ENCOUNTER — Inpatient Hospital Stay: Payer: Medicare Other

## 2018-03-22 VITALS — BP 109/53 | HR 48 | Temp 98.7°F | Resp 18 | Ht 70.0 in | Wt 187.9 lb

## 2018-03-22 DIAGNOSIS — Z86711 Personal history of pulmonary embolism: Secondary | ICD-10-CM | POA: Insufficient documentation

## 2018-03-22 DIAGNOSIS — C9000 Multiple myeloma not having achieved remission: Secondary | ICD-10-CM

## 2018-03-22 DIAGNOSIS — Z8546 Personal history of malignant neoplasm of prostate: Secondary | ICD-10-CM

## 2018-03-22 DIAGNOSIS — Z9221 Personal history of antineoplastic chemotherapy: Secondary | ICD-10-CM | POA: Insufficient documentation

## 2018-03-22 DIAGNOSIS — Z79899 Other long term (current) drug therapy: Secondary | ICD-10-CM

## 2018-03-22 DIAGNOSIS — C9001 Multiple myeloma in remission: Secondary | ICD-10-CM

## 2018-03-22 DIAGNOSIS — Z923 Personal history of irradiation: Secondary | ICD-10-CM | POA: Diagnosis not present

## 2018-03-22 LAB — CBC WITH DIFFERENTIAL (CANCER CENTER ONLY)
ABS IMMATURE GRANULOCYTES: 0.01 10*3/uL (ref 0.00–0.07)
BASOS ABS: 0.1 10*3/uL (ref 0.0–0.1)
BASOS PCT: 1 %
Eosinophils Absolute: 0.2 10*3/uL (ref 0.0–0.5)
Eosinophils Relative: 6 %
HCT: 43.1 % (ref 39.0–52.0)
HEMOGLOBIN: 13.5 g/dL (ref 13.0–17.0)
Immature Granulocytes: 0 %
Lymphocytes Relative: 32 %
Lymphs Abs: 1.3 10*3/uL (ref 0.7–4.0)
MCH: 28.1 pg (ref 26.0–34.0)
MCHC: 31.3 g/dL (ref 30.0–36.0)
MCV: 89.8 fL (ref 80.0–100.0)
Monocytes Absolute: 0.9 10*3/uL (ref 0.1–1.0)
Monocytes Relative: 23 %
NEUTROS ABS: 1.5 10*3/uL — AB (ref 1.7–7.7)
NRBC: 0 % (ref 0.0–0.2)
Neutrophils Relative %: 38 %
PLATELETS: 113 10*3/uL — AB (ref 150–400)
RBC: 4.8 MIL/uL (ref 4.22–5.81)
RDW: 16.6 % — ABNORMAL HIGH (ref 11.5–15.5)
WBC: 3.9 10*3/uL — AB (ref 4.0–10.5)

## 2018-03-22 LAB — CMP (CANCER CENTER ONLY)
ALT: 10 U/L (ref 0–44)
AST: 11 U/L — ABNORMAL LOW (ref 15–41)
Albumin: 3.3 g/dL — ABNORMAL LOW (ref 3.5–5.0)
Alkaline Phosphatase: 35 U/L — ABNORMAL LOW (ref 38–126)
Anion gap: 8 (ref 5–15)
BILIRUBIN TOTAL: 0.7 mg/dL (ref 0.3–1.2)
BUN: 14 mg/dL (ref 8–23)
CHLORIDE: 109 mmol/L (ref 98–111)
CO2: 26 mmol/L (ref 22–32)
Calcium: 9.2 mg/dL (ref 8.9–10.3)
Creatinine: 0.79 mg/dL (ref 0.61–1.24)
Glucose, Bld: 101 mg/dL — ABNORMAL HIGH (ref 70–99)
POTASSIUM: 4 mmol/L (ref 3.5–5.1)
Sodium: 143 mmol/L (ref 135–145)
TOTAL PROTEIN: 5.8 g/dL — AB (ref 6.5–8.1)

## 2018-03-22 MED ORDER — SODIUM CHLORIDE 0.9 % IV SOLN
INTRAVENOUS | Status: DC
  Administered 2018-03-22: 11:00:00 via INTRAVENOUS
  Filled 2018-03-22: qty 250

## 2018-03-22 MED ORDER — ZOLEDRONIC ACID 4 MG/100ML IV SOLN
4.0000 mg | Freq: Once | INTRAVENOUS | Status: AC
  Administered 2018-03-22: 4 mg via INTRAVENOUS
  Filled 2018-03-22: qty 100

## 2018-03-22 NOTE — Progress Notes (Signed)
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  Mitchell Lowery continues treatment on study at [facility].  Mitchell Lowery reports intermittent episodes of atrial fibrillation.  Mitchell Lowery saw Dr. Burt Knack last month.  Mitchell Lowery completed a two-week heart monitor on 03/19/2018.  Mitchell Lowery continues anticoagulation therapy.  No bleeding.  The left leg lesion has healed.  No recent infection.  Mitchell Lowery is exercising.  Mitchell Lowery has irregular bowel habits.  Objective:  Vital signs in last 24 hours:  Blood pressure (!) 109/53, pulse (!) 48, temperature 98.7 F (37.1 C), temperature source Oral, resp. rate 18, height _0  (1.778 m), weight 187 lb 14.4 oz (85.2 kg), SpO2 98 %.    Resp: Lungs clear bilaterally Cardio: Regular rate and rhythm, 2/6 systolic murmur GI: No hepatosplenomegaly Vascular: Trace ankle edema bilaterally, the left leg ulcer has healed   Lab Results:  Lab Results  Component Value Date   WBC 3.9 (L) 03/22/2018   HGB 13.5 03/22/2018   HCT 43.1 03/22/2018   MCV 89.8 03/22/2018   PLT 113 (L) 03/22/2018   NEUTROABS 1.5 (L) 03/22/2018    CMP  Lab Results  Component Value Date   NA 143 03/22/2018   K 4.0 03/22/2018   CL 109 03/22/2018   CO2 26 03/22/2018   GLUCOSE 101 (H) 03/22/2018   BUN 14 03/22/2018   CREATININE 0.79 03/22/2018   CALCIUM 9.2 03/22/2018   PROT 5.8 (L) 03/22/2018   ALBUMIN 3.3 (L) 03/22/2018   AST 11 (L) 03/22/2018   ALT 10 03/22/2018   ALKPHOS 35 (L) 03/22/2018   BILITOT 0.7 03/22/2018   GFRNONAA >60 03/22/2018   GFRAA >60 03/22/2018     Medications: I have reviewed the patient's current medications.   Assessment/Plan:  1. Multiple myeloma-confirmed on a bone marrow biopsy 08/07/2014, IgA lambda  Serum M spike and increased serum free lambda light chains  Myeloma FISH panel negative for chromosome 4, 11, 12, 13, 14, and 17 abnormalities, cytogenetics with no metaphases  Bone survey 08/15/2014 with  indeterminant lucent skull lesions  Cycle 1 RVD 08/22/2014  Cycle 2 RVD 09/19/2014  Cycle 3 RVD 10/17/2014  Cycle 4 RVD 11/14/2014 (Decadron reduced to 20 mg weekly, Revlimid 15 mg days 1 through 14, Velcade 3/4 weeks)  Cycle 5 RVD 12/12/2014  Serum M spike not detected 12/26/2014  Cycle 6 RVD 01/09/2015  Maintenance Revlimid, 10 mg daily, 02/05/2015 , discontinue 02/26/2015 secondary to leg edema and diarrhea  Revlimid resumed at a dose of 10 mg every other day beginning 03/13/2015  PET scan 24/40/1027-[facility] hypermetabolic bone lesions, few lucent lesions in the spine and pelvis  Revlimid discontinued January 2019  Enrollment on a clinical trial at [facility] with pomalidomide/Decadron+/- ixazomibbeginning 03/10/2017  2. 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 History of intermittent prostatitis  4. Skin rash 10/24/2014-referred to dermatology, biopsy consistent with local hypersensitivity reaction  5. 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 Left lower leg cellulitis 06/24/2015-treated with Keflex  7. History of mild thrombocytopenia secondary tomyeloma and systemic therapy  8. History of mild neutropenia-secondary to myeloma and systemic therapy  9. Fall with a skull fracture and subarachnoid blood/right frontal lobe contusions 10/17/2015  10. Right lower lobe pulmonary embolus 03/28/2016. Lovenox , transitioned to  Xarelto  11. Vesicular rashJune 2018-potentially related to the zoster vaccine, resolved      Disposition: 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  Mitchell Lowery has obtained a VGPR on the clinical trial at [facility].  Mitchell Lowery continues close clinical follow-up at [facility].  Mitchell Lowery will receive Zometa today.  Mitchell Lowery  continues Xarelto anticoagulation.  Mitchell Lowery will return for an office visit and Zometa in 3 months.  [name], MD  03/22/2018  9:49 AM

## 2018-03-22 NOTE — Progress Notes (Signed)
Patient reports that he sees MD at [facility] tomorrow. Recently saw Dr. Sherren Mocha, CD and wore a heart monitor 2 weeks. Has sent it back in for interpretation. Has not heard anything yet. BP is lower than his norm today. He says he is not symptomatic.

## 2018-03-22 NOTE — Telephone Encounter (Signed)
Scheduled appt per 2/24 los. ° °Printed calendar and avs. °

## 2018-03-22 NOTE — Patient Instructions (Signed)
[facility] Discharge Instructions for Patients Receiving Chemotherapy  Today you received the following chemotherapy agents Zoledronic Acid (ZOMETA).  To help prevent nausea and vomiting after your treatment, we encourage you to take your nausea medication as prescribed.  If you develop nausea and vomiting that is not controlled by your nausea medication, call the clinic.   BELOW ARE SYMPTOMS THAT SHOULD BE REPORTED IMMEDIATELY:  *FEVER GREATER THAN 100.5 F  *CHILLS WITH OR WITHOUT FEVER  NAUSEA AND VOMITING THAT IS NOT CONTROLLED WITH YOUR NAUSEA MEDICATION  *UNUSUAL SHORTNESS OF BREATH  *UNUSUAL BRUISING OR BLEEDING  TENDERNESS IN MOUTH AND THROAT WITH OR WITHOUT PRESENCE OF ULCERS  *URINARY PROBLEMS  *BOWEL PROBLEMS  UNUSUAL RASH Items with * indicate a potential emergency and should be followed up as soon as possible.  Feel free to call the clinic should you have any questions or concerns. The clinic phone number is [phone].  Please show the CHEMO ALERT CARD at check-in to the Emergency Department and triage nurse.   

## 2018-03-23 DIAGNOSIS — C9001 Multiple myeloma in remission: Secondary | ICD-10-CM | POA: Diagnosis not present

## 2018-03-23 DIAGNOSIS — Z7901 Long term (current) use of anticoagulants: Secondary | ICD-10-CM | POA: Diagnosis not present

## 2018-03-23 DIAGNOSIS — Z006 Encounter for examination for normal comparison and control in clinical research program: Secondary | ICD-10-CM | POA: Diagnosis not present

## 2018-03-23 DIAGNOSIS — D61811 Other drug-induced pancytopenia: Secondary | ICD-10-CM | POA: Diagnosis not present

## 2018-03-23 DIAGNOSIS — I4891 Unspecified atrial fibrillation: Secondary | ICD-10-CM | POA: Diagnosis not present

## 2018-03-23 DIAGNOSIS — R000 Tachycardia, unspecified: Secondary | ICD-10-CM | POA: Diagnosis not present

## 2018-03-23 DIAGNOSIS — T451X5D Adverse effect of antineoplastic and immunosuppressive drugs, subsequent encounter: Secondary | ICD-10-CM | POA: Diagnosis not present

## 2018-03-23 DIAGNOSIS — R197 Diarrhea, unspecified: Secondary | ICD-10-CM | POA: Diagnosis not present

## 2018-03-23 DIAGNOSIS — Z9221 Personal history of antineoplastic chemotherapy: Secondary | ICD-10-CM | POA: Diagnosis not present

## 2018-03-23 DIAGNOSIS — Z79899 Other long term (current) drug therapy: Secondary | ICD-10-CM | POA: Diagnosis not present

## 2018-03-23 DIAGNOSIS — D61818 Other pancytopenia: Secondary | ICD-10-CM | POA: Diagnosis not present

## 2018-03-23 DIAGNOSIS — K5900 Constipation, unspecified: Secondary | ICD-10-CM | POA: Diagnosis not present

## 2018-03-23 DIAGNOSIS — G620 Drug-induced polyneuropathy: Secondary | ICD-10-CM | POA: Diagnosis not present

## 2018-03-23 DIAGNOSIS — I2699 Other pulmonary embolism without acute cor pulmonale: Secondary | ICD-10-CM | POA: Diagnosis not present

## 2018-03-23 DIAGNOSIS — D801 Nonfamilial hypogammaglobulinemia: Secondary | ICD-10-CM | POA: Diagnosis not present

## 2018-03-23 DIAGNOSIS — G629 Polyneuropathy, unspecified: Secondary | ICD-10-CM | POA: Diagnosis not present

## 2018-03-23 DIAGNOSIS — T451X5A Adverse effect of antineoplastic and immunosuppressive drugs, initial encounter: Secondary | ICD-10-CM | POA: Insufficient documentation

## 2018-03-23 DIAGNOSIS — Z6826 Body mass index (BMI) 26.0-26.9, adult: Secondary | ICD-10-CM | POA: Diagnosis not present

## 2018-03-23 DIAGNOSIS — R358 Other polyuria: Secondary | ICD-10-CM | POA: Diagnosis not present

## 2018-03-24 DIAGNOSIS — I480 Paroxysmal atrial fibrillation: Secondary | ICD-10-CM | POA: Diagnosis not present

## 2018-03-29 ENCOUNTER — Other Ambulatory Visit: Payer: Self-pay | Admitting: Oncology

## 2018-03-29 DIAGNOSIS — I2699 Other pulmonary embolism without acute cor pulmonale: Secondary | ICD-10-CM

## 2018-03-30 MED FILL — XARELTO 20 MG TABLET: 20 | 30 days supply | Qty: 30 | Fill #0

## 2018-04-14 ENCOUNTER — Telehealth: Payer: Self-pay | Admitting: Cardiovascular Disease

## 2018-04-14 NOTE — Telephone Encounter (Signed)
New Message   Patty from [facility] states patient called the office wanting to know if Dr. Burt Knack had called Dr. Amalia Hailey.  Patty states Dr. Amalia Hailey didn't need anything from Dr. Burt Knack she just wants to know if Dr. Burt Knack needed anything from Dr. Amalia Hailey since patient states Dr. Burt Knack was going to give him a call.  Call them back if needed [phone].  They are available through Epic as well.

## 2018-04-20 DIAGNOSIS — C9000 Multiple myeloma not having achieved remission: Secondary | ICD-10-CM | POA: Diagnosis not present

## 2018-04-20 DIAGNOSIS — C9001 Multiple myeloma in remission: Secondary | ICD-10-CM | POA: Diagnosis not present

## 2018-04-20 DIAGNOSIS — Z006 Encounter for examination for normal comparison and control in clinical research program: Secondary | ICD-10-CM | POA: Diagnosis not present

## 2018-04-22 NOTE — Telephone Encounter (Signed)
Called and left message with patient to discuss further, thanks

## 2018-04-22 NOTE — Telephone Encounter (Signed)
Dr. Burt Knack personally spoke with Mitchell Lowery. He will keep follow-up and echo as planned this summer.

## 2018-04-27 MED FILL — XARELTO 20 MG TABLET: 20 | 30 days supply | Qty: 30 | Fill #1

## 2018-05-10 ENCOUNTER — Other Ambulatory Visit: Payer: Self-pay | Admitting: *Deleted

## 2018-05-10 ENCOUNTER — Telehealth: Payer: Self-pay | Admitting: *Deleted

## 2018-05-10 DIAGNOSIS — C9001 Multiple myeloma in remission: Secondary | ICD-10-CM

## 2018-05-10 NOTE — Progress Notes (Signed)
Call from Suzan Slick w/Dr. Amalia Hailey at [facility] clinical trials department. Needs study labs locally on 05/17/18 and results faxed to [phone]. Called patient and scheduled for 05/17/18 at 0815. He already has the jug for the 24 hour urine collection.

## 2018-05-10 NOTE — Telephone Encounter (Signed)
Needs labs drawn on 05/17/18 and additional days as well. She will email orders/dates and how to get results returned to [facility].

## 2018-05-17 ENCOUNTER — Telehealth: Payer: Self-pay | Admitting: Emergency Medicine

## 2018-05-17 ENCOUNTER — Encounter: Payer: Self-pay | Admitting: *Deleted

## 2018-05-17 ENCOUNTER — Other Ambulatory Visit: Payer: Self-pay

## 2018-05-17 ENCOUNTER — Inpatient Hospital Stay: Payer: Medicare Other | Attending: Oncology

## 2018-05-17 DIAGNOSIS — C9001 Multiple myeloma in remission: Secondary | ICD-10-CM

## 2018-05-17 DIAGNOSIS — C9000 Multiple myeloma not having achieved remission: Secondary | ICD-10-CM | POA: Insufficient documentation

## 2018-05-17 LAB — CMP (CANCER CENTER ONLY)
ALT: 11 U/L (ref 0–44)
AST: 12 U/L — ABNORMAL LOW (ref 15–41)
Albumin: 3.4 g/dL — ABNORMAL LOW (ref 3.5–5.0)
Alkaline Phosphatase: 37 U/L — ABNORMAL LOW (ref 38–126)
Anion gap: 9 (ref 5–15)
BUN: 10 mg/dL (ref 8–23)
CO2: 26 mmol/L (ref 22–32)
Calcium: 8.9 mg/dL (ref 8.9–10.3)
Chloride: 107 mmol/L (ref 98–111)
Creatinine: 0.74 mg/dL (ref 0.61–1.24)
GFR, Est AFR Am: >60 mL/min (ref >60)
GFR, Estimated: >60 mL/min (ref >60)
Glucose, Bld: 88 mg/dL (ref 70–99)
Potassium: 3.7 mmol/L (ref 3.5–5.1)
Sodium: 142 mmol/L (ref 135–145)
Total Bilirubin: 0.7 mg/dL (ref 0.3–1.2)
Total Protein: 6.3 g/dL — ABNORMAL LOW (ref 6.5–8.1)

## 2018-05-17 LAB — CBC WITH DIFFERENTIAL (CANCER CENTER ONLY)
Abs Immature Granulocytes: 0 10*3/uL (ref 0.00–0.07)
Basophils Absolute: 0.1 10*3/uL (ref 0.0–0.1)
Basophils Relative: 1 %
Eosinophils Absolute: 0.2 10*3/uL (ref 0.0–0.5)
Eosinophils Relative: 5 %
HCT: 44.9 % (ref 39.0–52.0)
Hemoglobin: 13.9 g/dL (ref 13.0–17.0)
Immature Granulocytes: 0 %
Lymphocytes Relative: 32 %
Lymphs Abs: 1.2 10*3/uL (ref 0.7–4.0)
MCH: 27.9 pg (ref 26.0–34.0)
MCHC: 31 g/dL (ref 30.0–36.0)
MCV: 90 fL (ref 80.0–100.0)
Monocytes Absolute: 0.8 10*3/uL (ref 0.1–1.0)
Monocytes Relative: 21 %
Neutro Abs: 1.5 10*3/uL — ABNORMAL LOW (ref 1.7–7.7)
Neutrophils Relative %: 41 %
Platelet Count: 103 10*3/uL — ABNORMAL LOW (ref 150–400)
RBC: 4.99 MIL/uL (ref 4.22–5.81)
RDW: 16.3 % — ABNORMAL HIGH (ref 11.5–15.5)
WBC Count: 3.7 10*3/uL — ABNORMAL LOW (ref 4.0–10.5)
nRBC: 0 % (ref 0.0–0.2)

## 2018-05-17 LAB — PHOSPHORUS: Phosphorus: 2.9 mg/dL (ref 2.5–4.6)

## 2018-05-17 LAB — MAGNESIUM: Magnesium: 1.7 mg/dL (ref 1.7–2.4)

## 2018-05-17 NOTE — Telephone Encounter (Signed)
Temp 100.4 in lobby temporally.  Pt taken to private room.  Denies any other Covid-19 symptoms or contact with others who may be sick.  Temp retaken, 99.4 orally.  Labs drawn peripherally from [facility] by RN [name], lab aware that they are going to [facility].  Pt also brought 24 hour urine specimen, given to lab for [facility] as well.  Spoke to [place] over the phone.  Since pt is asymptomatic, afebrile upon recheck, and has no other appts today pt was cleared to go home at this time.

## 2018-05-17 NOTE — Progress Notes (Signed)
Faxed CBC and CMP with Mg+ and Phosphorus to [facility] [phone] and confirmation received.

## 2018-05-18 DIAGNOSIS — Z006 Encounter for examination for normal comparison and control in clinical research program: Secondary | ICD-10-CM | POA: Diagnosis not present

## 2018-05-18 DIAGNOSIS — C9001 Multiple myeloma in remission: Secondary | ICD-10-CM | POA: Diagnosis not present

## 2018-05-18 DIAGNOSIS — T451X5A Adverse effect of antineoplastic and immunosuppressive drugs, initial encounter: Secondary | ICD-10-CM | POA: Diagnosis not present

## 2018-05-18 DIAGNOSIS — D61810 Antineoplastic chemotherapy induced pancytopenia: Secondary | ICD-10-CM | POA: Diagnosis not present

## 2018-05-18 DIAGNOSIS — I4891 Unspecified atrial fibrillation: Secondary | ICD-10-CM | POA: Diagnosis not present

## 2018-05-18 DIAGNOSIS — G620 Drug-induced polyneuropathy: Secondary | ICD-10-CM | POA: Diagnosis not present

## 2018-05-18 LAB — PROTEIN ELECTROPHORESIS, SERUM
A/G Ratio: 1.3 (ref 0.7–1.7)
Albumin ELP: 3.3 g/dL (ref 2.9–4.4)
Alpha-1-Globulin: 0.2 g/dL (ref 0.0–0.4)
Alpha-2-Globulin: 0.9 g/dL (ref 0.4–1.0)
Beta Globulin: 1 g/dL (ref 0.7–1.3)
Gamma Globulin: 0.4 g/dL (ref 0.4–1.8)
Globulin, Total: 2.5 g/dL (ref 2.2–3.9)
Total Protein ELP: 5.8 g/dL — ABNORMAL LOW (ref 6.0–8.5)

## 2018-05-18 LAB — UPEP/UIFE/LIGHT CHAINS/TP, 24-HR UR
% BETA, Urine: 0 %
ALPHA 1 URINE: 0 %
Albumin, U: 100 %
Alpha 2, Urine: 0 %
Free Kappa Lt Chains,Ur: 6.24 mg/L (ref 0.63–113.79)
Free Kappa/Lambda Ratio: 0.99 — ABNORMAL LOW (ref 1.03–31.76)
Free Lambda Lt Chains,Ur: 6.28 mg/L (ref 0.47–11.77)
GAMMA GLOBULIN URINE: 0 %
Total Protein, Urine-Ur/day: 64 mg/24 hr (ref 30–150)
Total Protein, Urine: 6.4 mg/dL
Total Volume: 1000

## 2018-05-18 LAB — IMMUNOFIXATION ELECTROPHORESIS
IgA: 242 mg/dL (ref 61–437)
IgG (Immunoglobin G), Serum: 417 mg/dL — ABNORMAL LOW (ref 603–1613)
IgM (Immunoglobulin M), Srm: 25 mg/dL (ref 15–143)
Total Protein ELP: 6 g/dL (ref 6.0–8.5)

## 2018-05-18 LAB — IGG, IGA, IGM
IgA: 244 mg/dL (ref 61–437)
IgG (Immunoglobin G), Serum: 423 mg/dL — ABNORMAL LOW (ref 603–1613)
IgM (Immunoglobulin M), Srm: 25 mg/dL (ref 15–143)

## 2018-05-18 LAB — KAPPA/LAMBDA LIGHT CHAINS
Kappa free light chain: 7.8 mg/L (ref 3.3–19.4)
Kappa, lambda light chain ratio: 0.18 — ABNORMAL LOW (ref 0.26–1.65)
Lambda free light chains: 43.8 mg/L — ABNORMAL HIGH (ref 5.7–26.3)

## 2018-05-19 ENCOUNTER — Encounter: Payer: Self-pay | Admitting: *Deleted

## 2018-05-19 NOTE — Progress Notes (Signed)
Faxed all labs drawn on 05/17/18 to [facility] #[phone] att: Suzan Slick w/confirmation received.

## 2018-05-27 MED FILL — XARELTO 20 MG TABLET: 20 | 30 days supply | Qty: 30 | Fill #2

## 2018-06-08 ENCOUNTER — Encounter: Payer: Self-pay | Admitting: *Deleted

## 2018-06-08 ENCOUNTER — Other Ambulatory Visit: Payer: Self-pay | Admitting: *Deleted

## 2018-06-08 DIAGNOSIS — C9001 Multiple myeloma in remission: Secondary | ICD-10-CM

## 2018-06-08 NOTE — Progress Notes (Signed)
Email from Isac Caddy at [facility] requesting labs on 06/14/18 as was done in April and fax results to [phone]. Requested call to confirm at [phone]. Scheduled appointment for 11:15 on 06/14/18 and left this on patient's voice mail and sent Mychart message. Also notified, nurse Patty at [facility]

## 2018-06-14 ENCOUNTER — Other Ambulatory Visit: Payer: Self-pay

## 2018-06-14 ENCOUNTER — Inpatient Hospital Stay: Payer: Medicare Other | Attending: Oncology

## 2018-06-14 DIAGNOSIS — C9000 Multiple myeloma not having achieved remission: Secondary | ICD-10-CM | POA: Insufficient documentation

## 2018-06-14 DIAGNOSIS — Z79899 Other long term (current) drug therapy: Secondary | ICD-10-CM | POA: Diagnosis not present

## 2018-06-14 DIAGNOSIS — C9001 Multiple myeloma in remission: Secondary | ICD-10-CM

## 2018-06-14 LAB — CMP (CANCER CENTER ONLY)
ALT: 17 U/L (ref 0–44)
AST: 14 U/L — ABNORMAL LOW (ref 15–41)
Albumin: 3.2 g/dL — ABNORMAL LOW (ref 3.5–5.0)
Alkaline Phosphatase: 35 U/L — ABNORMAL LOW (ref 38–126)
Anion gap: 8 (ref 5–15)
BUN: 13 mg/dL (ref 8–23)
CO2: 26 mmol/L (ref 22–32)
Calcium: 9 mg/dL (ref 8.9–10.3)
Chloride: 107 mmol/L (ref 98–111)
Creatinine: 0.74 mg/dL (ref 0.61–1.24)
GFR, Est AFR Am: >60 mL/min (ref >60)
GFR, Estimated: >60 mL/min (ref >60)
Glucose, Bld: 108 mg/dL — ABNORMAL HIGH (ref 70–99)
Potassium: 3.8 mmol/L (ref 3.5–5.1)
Sodium: 141 mmol/L (ref 135–145)
Total Bilirubin: 0.5 mg/dL (ref 0.3–1.2)
Total Protein: 5.9 g/dL — ABNORMAL LOW (ref 6.5–8.1)

## 2018-06-14 LAB — CBC WITH DIFFERENTIAL (CANCER CENTER ONLY)
Abs Immature Granulocytes: 0 10*3/uL (ref 0.00–0.07)
Basophils Absolute: 0 10*3/uL (ref 0.0–0.1)
Basophils Relative: 1 %
Eosinophils Absolute: 0.2 10*3/uL (ref 0.0–0.5)
Eosinophils Relative: 6 %
HCT: 41.9 % (ref 39.0–52.0)
Hemoglobin: 13.3 g/dL (ref 13.0–17.0)
Immature Granulocytes: 0 %
Lymphocytes Relative: 28 %
Lymphs Abs: 0.9 10*3/uL (ref 0.7–4.0)
MCH: 28.1 pg (ref 26.0–34.0)
MCHC: 31.7 g/dL (ref 30.0–36.0)
MCV: 88.4 fL (ref 80.0–100.0)
Monocytes Absolute: 0.8 10*3/uL (ref 0.1–1.0)
Monocytes Relative: 23 %
Neutro Abs: 1.3 10*3/uL — ABNORMAL LOW (ref 1.7–7.7)
Neutrophils Relative %: 42 %
Platelet Count: 109 10*3/uL — ABNORMAL LOW (ref 150–400)
RBC: 4.74 MIL/uL (ref 4.22–5.81)
RDW: 15.9 % — ABNORMAL HIGH (ref 11.5–15.5)
WBC Count: 3.3 10*3/uL — ABNORMAL LOW (ref 4.0–10.5)
nRBC: 0 % (ref 0.0–0.2)

## 2018-06-14 LAB — MAGNESIUM: Magnesium: 1.6 mg/dL — ABNORMAL LOW (ref 1.7–2.4)

## 2018-06-14 LAB — PHOSPHORUS: Phosphorus: 3.8 mg/dL (ref 2.5–4.6)

## 2018-06-15 ENCOUNTER — Telehealth: Payer: Self-pay | Admitting: *Deleted

## 2018-06-15 DIAGNOSIS — C9001 Multiple myeloma in remission: Secondary | ICD-10-CM | POA: Diagnosis not present

## 2018-06-15 DIAGNOSIS — Z006 Encounter for examination for normal comparison and control in clinical research program: Secondary | ICD-10-CM | POA: Diagnosis not present

## 2018-06-15 LAB — IMMUNOFIXATION ELECTROPHORESIS
IgA: 219 mg/dL (ref 61–437)
IgG (Immunoglobin G), Serum: 371 mg/dL — ABNORMAL LOW (ref 603–1613)
IgM (Immunoglobulin M), Srm: 27 mg/dL (ref 15–143)
Total Protein ELP: 5.3 g/dL — ABNORMAL LOW (ref 6.0–8.5)

## 2018-06-15 LAB — UPEP/UIFE/LIGHT CHAINS/TP, 24-HR UR
% BETA, Urine: 28.2 %
ALPHA 1 URINE: 7.4 %
Albumin, U: 31.4 %
Alpha 2, Urine: 15.2 %
Free Kappa Lt Chains,Ur: 9.59 mg/L (ref 0.63–113.79)
Free Kappa/Lambda Ratio: 0.92 — ABNORMAL LOW (ref 1.03–31.76)
Free Lambda Lt Chains,Ur: 10.45 mg/L (ref 0.47–11.77)
GAMMA GLOBULIN URINE: 17.8 %
M-SPIKE %, Urine: 10 % — ABNORMAL HIGH
M-Spike, Mg/24 Hr: 6 mg/24 hr — ABNORMAL HIGH
Total Protein, Urine-Ur/day: 57 mg/24 hr (ref 30–150)
Total Protein, Urine: 5.7 mg/dL
Total Volume: 1000

## 2018-06-15 LAB — IGG, IGA, IGM
IgA: 219 mg/dL (ref 61–437)
IgG (Immunoglobin G), Serum: 374 mg/dL — ABNORMAL LOW (ref 603–1613)
IgM (Immunoglobulin M), Srm: 25 mg/dL (ref 15–143)

## 2018-06-15 LAB — KAPPA/LAMBDA LIGHT CHAINS
Kappa free light chain: 7.3 mg/L (ref 3.3–19.4)
Kappa, lambda light chain ratio: 0.16 — ABNORMAL LOW (ref 0.26–1.65)
Lambda free light chains: 46.9 mg/L — ABNORMAL HIGH (ref 5.7–26.3)

## 2018-06-15 LAB — PROTEIN ELECTROPHORESIS, SERUM
A/G Ratio: 1.6 (ref 0.7–1.7)
Albumin ELP: 3.3 g/dL (ref 2.9–4.4)
Alpha-1-Globulin: 0.2 g/dL (ref 0.0–0.4)
Alpha-2-Globulin: 0.7 g/dL (ref 0.4–1.0)
Beta Globulin: 0.9 g/dL (ref 0.7–1.3)
Gamma Globulin: 0.3 g/dL — ABNORMAL LOW (ref 0.4–1.8)
Globulin, Total: 2.1 g/dL — ABNORMAL LOW (ref 2.2–3.9)
M-Spike, %: 0.1 g/dL — ABNORMAL HIGH
Total Protein ELP: 5.4 g/dL — ABNORMAL LOW (ref 6.0–8.5)

## 2018-06-15 NOTE — Telephone Encounter (Signed)
Called to report he is having tenderness in bilateral upper molars when he bites. Currently on clinical trial at [facility] and it was suggested he hold the Zometa until he sees a dentist. [name] agrees and said to move 5/26 visit out to after seeing the dentist. Patient will have dental evaluation and call office to reschedule once this is completed.

## 2018-06-22 ENCOUNTER — Other Ambulatory Visit: Payer: Medicare Other

## 2018-06-22 ENCOUNTER — Ambulatory Visit: Payer: Medicare Other

## 2018-06-22 ENCOUNTER — Ambulatory Visit: Payer: Medicare Other | Admitting: Oncology

## 2018-06-24 ENCOUNTER — Telehealth: Payer: Self-pay | Admitting: *Deleted

## 2018-06-24 NOTE — Telephone Encounter (Signed)
Called to report his dentist did not feel doing Zometa would be a problem with his dental issues. Was told today he had cavities in his wisdom teeth. Will be seeing oral surgeon soon to consider extractions. Spoke w/[facility] today and the suggest continuing to hold the Zometa. He will email his dental visit notes to [name].

## 2018-06-25 DIAGNOSIS — Z0000 Encounter for general adult medical examination without abnormal findings: Secondary | ICD-10-CM | POA: Diagnosis not present

## 2018-06-25 DIAGNOSIS — Z1389 Encounter for screening for other disorder: Secondary | ICD-10-CM | POA: Diagnosis not present

## 2018-06-28 MED FILL — XARELTO 20 MG TABLET: 20 | 30 days supply | Qty: 30 | Fill #3

## 2018-07-05 ENCOUNTER — Other Ambulatory Visit: Payer: Self-pay | Admitting: *Deleted

## 2018-07-05 DIAGNOSIS — C9000 Multiple myeloma not having achieved remission: Secondary | ICD-10-CM

## 2018-07-05 NOTE — Progress Notes (Unsigned)
Patient called to have labs drawn here at [facility] for his video visit with

## 2018-07-06 ENCOUNTER — Telehealth: Payer: Self-pay | Admitting: Oncology

## 2018-07-06 NOTE — Telephone Encounter (Signed)
Scheduled appt per sch msg. Called and spoke with patient. Confirmed date and time °

## 2018-07-07 ENCOUNTER — Other Ambulatory Visit (HOSPITAL_COMMUNITY): Payer: Medicare Other

## 2018-07-07 ENCOUNTER — Telehealth: Payer: Self-pay | Admitting: Cardiovascular Disease

## 2018-07-07 NOTE — Telephone Encounter (Signed)
New Message  Pt arrived in Lobby(1st floor) YB:FXOV and office visit. Message rec'd in scheduling area. I called Pt to informed him he will be scheduled at a later date when further instructions are rec'd from Dr. Antionette Char Nurse. reneeval

## 2018-07-09 ENCOUNTER — Ambulatory Visit: Payer: Medicare Other | Admitting: Cardiovascular Disease

## 2018-07-12 ENCOUNTER — Inpatient Hospital Stay: Payer: Medicare Other | Attending: Oncology

## 2018-07-12 ENCOUNTER — Other Ambulatory Visit: Payer: Self-pay

## 2018-07-12 DIAGNOSIS — C9000 Multiple myeloma not having achieved remission: Secondary | ICD-10-CM | POA: Diagnosis not present

## 2018-07-12 DIAGNOSIS — Z79899 Other long term (current) drug therapy: Secondary | ICD-10-CM | POA: Diagnosis not present

## 2018-07-12 LAB — CMP (CANCER CENTER ONLY)
ALT: 15 U/L (ref 0–44)
AST: 14 U/L — ABNORMAL LOW (ref 15–41)
Albumin: 3.3 g/dL — ABNORMAL LOW (ref 3.5–5.0)
Alkaline Phosphatase: 38 U/L (ref 38–126)
Anion gap: 10 (ref 5–15)
BUN: 11 mg/dL (ref 8–23)
CO2: 24 mmol/L (ref 22–32)
Calcium: 8.4 mg/dL — ABNORMAL LOW (ref 8.9–10.3)
Chloride: 107 mmol/L (ref 98–111)
Creatinine: 0.73 mg/dL (ref 0.61–1.24)
GFR, Est AFR Am: >60 mL/min (ref >60)
GFR, Estimated: >60 mL/min (ref >60)
Glucose, Bld: 96 mg/dL (ref 70–99)
Potassium: 3.8 mmol/L (ref 3.5–5.1)
Sodium: 141 mmol/L (ref 135–145)
Total Bilirubin: 0.8 mg/dL (ref 0.3–1.2)
Total Protein: 6 g/dL — ABNORMAL LOW (ref 6.5–8.1)

## 2018-07-12 LAB — CBC (CANCER CENTER ONLY)
HCT: 43.5 % (ref 39.0–52.0)
Hemoglobin: 13.6 g/dL (ref 13.0–17.0)
MCH: 28 pg (ref 26.0–34.0)
MCHC: 31.3 g/dL (ref 30.0–36.0)
MCV: 89.5 fL (ref 80.0–100.0)
Platelet Count: 116 10*3/uL — ABNORMAL LOW (ref 150–400)
RBC: 4.86 MIL/uL (ref 4.22–5.81)
RDW: 16.1 % — ABNORMAL HIGH (ref 11.5–15.5)
WBC Count: 4.4 10*3/uL (ref 4.0–10.5)
nRBC: 0 % (ref 0.0–0.2)

## 2018-07-12 LAB — PHOSPHORUS: Phosphorus: 3 mg/dL (ref 2.5–4.6)

## 2018-07-12 LAB — MAGNESIUM: Magnesium: 1.7 mg/dL (ref 1.7–2.4)

## 2018-07-13 ENCOUNTER — Other Ambulatory Visit: Payer: Self-pay | Admitting: *Deleted

## 2018-07-13 ENCOUNTER — Other Ambulatory Visit: Payer: Self-pay

## 2018-07-13 DIAGNOSIS — X58XXXA Exposure to other specified factors, initial encounter: Secondary | ICD-10-CM | POA: Diagnosis not present

## 2018-07-13 DIAGNOSIS — L089 Local infection of the skin and subcutaneous tissue, unspecified: Secondary | ICD-10-CM | POA: Diagnosis not present

## 2018-07-13 DIAGNOSIS — Z006 Encounter for examination for normal comparison and control in clinical research program: Secondary | ICD-10-CM | POA: Diagnosis not present

## 2018-07-13 DIAGNOSIS — C9000 Multiple myeloma not having achieved remission: Secondary | ICD-10-CM

## 2018-07-13 DIAGNOSIS — Z7901 Long term (current) use of anticoagulants: Secondary | ICD-10-CM | POA: Diagnosis not present

## 2018-07-13 DIAGNOSIS — C9001 Multiple myeloma in remission: Secondary | ICD-10-CM | POA: Diagnosis not present

## 2018-07-13 DIAGNOSIS — S60512A Abrasion of left hand, initial encounter: Secondary | ICD-10-CM | POA: Diagnosis not present

## 2018-07-13 DIAGNOSIS — Z79899 Other long term (current) drug therapy: Secondary | ICD-10-CM | POA: Diagnosis not present

## 2018-07-13 LAB — CBC WITH DIFFERENTIAL/PLATELET
Abs Immature Granulocytes: 0.01 10*3/uL (ref 0.00–0.07)
Basophils Absolute: 0.1 10*3/uL (ref 0.0–0.1)
Basophils Relative: 1 %
Eosinophils Absolute: 0.2 10*3/uL (ref 0.0–0.5)
Eosinophils Relative: 5 %
HCT: 44.1 % (ref 39.0–52.0)
Hemoglobin: 13.8 g/dL (ref 13.0–17.0)
Immature Granulocytes: 0 %
Lymphocytes Relative: 29 %
Lymphs Abs: 1.3 10*3/uL (ref 0.7–4.0)
MCH: 28.4 pg (ref 26.0–34.0)
MCHC: 31.3 g/dL (ref 30.0–36.0)
MCV: 90.7 fL (ref 80.0–100.0)
Monocytes Absolute: 0.9 10*3/uL (ref 0.1–1.0)
Monocytes Relative: 20 %
Neutro Abs: 2.1 10*3/uL (ref 1.7–7.7)
Neutrophils Relative %: 45 %
Platelets: 117 10*3/uL — ABNORMAL LOW (ref 150–400)
RBC: 4.86 MIL/uL (ref 4.22–5.81)
RDW: 16.5 % — ABNORMAL HIGH (ref 11.5–15.5)
WBC: 4.5 10*3/uL (ref 4.0–10.5)
nRBC: 0 % (ref 0.0–0.2)

## 2018-07-13 LAB — IGG, IGA, IGM
IgA: 217 mg/dL (ref 61–437)
IgG (Immunoglobin G), Serum: 377 mg/dL — ABNORMAL LOW (ref 603–1613)
IgM (Immunoglobulin M), Srm: 26 mg/dL (ref 15–143)

## 2018-07-13 LAB — IMMUNOFIXATION ELECTROPHORESIS
IgA: 218 mg/dL (ref 61–437)
IgG (Immunoglobin G), Serum: 374 mg/dL — ABNORMAL LOW (ref 603–1613)
IgM (Immunoglobulin M), Srm: 27 mg/dL (ref 15–143)
Total Protein ELP: 5.7 g/dL — ABNORMAL LOW (ref 6.0–8.5)

## 2018-07-13 LAB — KAPPA/LAMBDA LIGHT CHAINS
Kappa free light chain: 8.3 mg/L (ref 3.3–19.4)
Kappa, lambda light chain ratio: 0.15 — ABNORMAL LOW (ref 0.26–1.65)
Lambda free light chains: 56.7 mg/L — ABNORMAL HIGH (ref 5.7–26.3)

## 2018-07-13 NOTE — Progress Notes (Signed)
Diff was not done with CBC on 07/12/18. Called lab and they will process diff from yesterday's sample and review it to be sure it looks accurate based on CBC result and past diff.

## 2018-07-14 LAB — UPEP/UIFE/LIGHT CHAINS/TP, 24-HR UR
% BETA, Urine: 29.8 %
ALPHA 1 URINE: 3 %
Albumin, U: 29.3 %
Alpha 2, Urine: 12 %
Free Kappa Lt Chains,Ur: 12.71 mg/L (ref 0.63–113.79)
Free Kappa/Lambda Ratio: 0.59 — ABNORMAL LOW (ref 1.03–31.76)
Free Lambda Lt Chains,Ur: 21.51 mg/L — ABNORMAL HIGH (ref 0.47–11.77)
GAMMA GLOBULIN URINE: 26 %
M-SPIKE %, Urine: 6.3 % — ABNORMAL HIGH
M-Spike, Mg/24 Hr: 4 mg/24 hr — ABNORMAL HIGH
Total Protein, Urine-Ur/day: 65 mg/24 hr (ref 30–150)
Total Protein, Urine: 6.8 mg/dL
Total Volume: 950

## 2018-07-14 LAB — MULTIPLE MYELOMA PANEL, SERUM
Albumin SerPl Elph-Mcnc: 3.3 g/dL (ref 2.9–4.4)
Albumin/Glob SerPl: 1.4 (ref 0.7–1.7)
Alpha 1: 0.2 g/dL (ref 0.0–0.4)
Alpha2 Glob SerPl Elph-Mcnc: 0.8 g/dL (ref 0.4–1.0)
B-Globulin SerPl Elph-Mcnc: 1 g/dL (ref 0.7–1.3)
Gamma Glob SerPl Elph-Mcnc: 0.4 g/dL (ref 0.4–1.8)
Globulin, Total: 2.4 g/dL (ref 2.2–3.9)
IgA: 226 mg/dL (ref 61–437)
IgG (Immunoglobin G), Serum: 373 mg/dL — ABNORMAL LOW (ref 603–1613)
IgM (Immunoglobulin M), Srm: 22 mg/dL (ref 15–143)
M Protein SerPl Elph-Mcnc: 0.2 g/dL — ABNORMAL HIGH
Total Protein ELP: 5.7 g/dL — ABNORMAL LOW (ref 6.0–8.5)

## 2018-07-22 DIAGNOSIS — C61 Malignant neoplasm of prostate: Secondary | ICD-10-CM | POA: Diagnosis not present

## 2018-07-28 MED FILL — XARELTO 20 MG TABLET: 20 | 30 days supply | Qty: 30 | Fill #4

## 2018-08-02 ENCOUNTER — Telehealth: Payer: Self-pay

## 2018-08-02 DIAGNOSIS — B356 Tinea cruris: Secondary | ICD-10-CM | POA: Diagnosis not present

## 2018-08-02 DIAGNOSIS — C61 Malignant neoplasm of prostate: Secondary | ICD-10-CM | POA: Diagnosis not present

## 2018-08-02 DIAGNOSIS — E291 Testicular hypofunction: Secondary | ICD-10-CM | POA: Diagnosis not present

## 2018-08-02 NOTE — Telephone Encounter (Signed)
Called to schedule patient for 6 mo echo and OV with Dr. [facility]. Scheduled echo 7/20 and OV 7/22. The patient was grateful for call and agrees with treatment plan.

## 2018-08-06 DIAGNOSIS — K0889 Other specified disorders of teeth and supporting structures: Secondary | ICD-10-CM | POA: Diagnosis not present

## 2018-08-10 DIAGNOSIS — C9000 Multiple myeloma not having achieved remission: Secondary | ICD-10-CM | POA: Diagnosis not present

## 2018-08-10 DIAGNOSIS — K029 Dental caries, unspecified: Secondary | ICD-10-CM | POA: Diagnosis not present

## 2018-08-10 DIAGNOSIS — C9001 Multiple myeloma in remission: Secondary | ICD-10-CM | POA: Diagnosis not present

## 2018-08-10 DIAGNOSIS — R197 Diarrhea, unspecified: Secondary | ICD-10-CM | POA: Diagnosis not present

## 2018-08-10 DIAGNOSIS — I4891 Unspecified atrial fibrillation: Secondary | ICD-10-CM | POA: Diagnosis not present

## 2018-08-10 DIAGNOSIS — Z9221 Personal history of antineoplastic chemotherapy: Secondary | ICD-10-CM | POA: Diagnosis not present

## 2018-08-10 DIAGNOSIS — Z6826 Body mass index (BMI) 26.0-26.9, adult: Secondary | ICD-10-CM | POA: Diagnosis not present

## 2018-08-10 DIAGNOSIS — Z006 Encounter for examination for normal comparison and control in clinical research program: Secondary | ICD-10-CM | POA: Diagnosis not present

## 2018-08-10 DIAGNOSIS — D61818 Other pancytopenia: Secondary | ICD-10-CM | POA: Diagnosis not present

## 2018-08-10 DIAGNOSIS — G629 Polyneuropathy, unspecified: Secondary | ICD-10-CM | POA: Diagnosis not present

## 2018-08-16 ENCOUNTER — Other Ambulatory Visit: Payer: Self-pay

## 2018-08-16 ENCOUNTER — Ambulatory Visit (HOSPITAL_COMMUNITY): Payer: Medicare Other | Attending: Internal Medicine

## 2018-08-16 DIAGNOSIS — I480 Paroxysmal atrial fibrillation: Secondary | ICD-10-CM | POA: Diagnosis not present

## 2018-08-16 DIAGNOSIS — I350 Nonrheumatic aortic (valve) stenosis: Secondary | ICD-10-CM | POA: Diagnosis not present

## 2018-08-18 ENCOUNTER — Ambulatory Visit (INDEPENDENT_AMBULATORY_CARE_PROVIDER_SITE_OTHER): Payer: Medicare Other | Admitting: Cardiovascular Disease

## 2018-08-18 ENCOUNTER — Telehealth: Payer: Self-pay | Admitting: Cardiovascular Disease

## 2018-08-18 ENCOUNTER — Other Ambulatory Visit: Payer: Self-pay

## 2018-08-18 ENCOUNTER — Encounter: Payer: Self-pay | Admitting: Cardiovascular Disease

## 2018-08-18 VITALS — BP 120/66 | HR 71 | Ht 70.0 in | Wt 193.8 lb

## 2018-08-18 DIAGNOSIS — I480 Paroxysmal atrial fibrillation: Secondary | ICD-10-CM

## 2018-08-18 DIAGNOSIS — E291 Testicular hypofunction: Secondary | ICD-10-CM | POA: Diagnosis not present

## 2018-08-18 DIAGNOSIS — I350 Nonrheumatic aortic (valve) stenosis: Secondary | ICD-10-CM

## 2018-08-18 DIAGNOSIS — N5201 Erectile dysfunction due to arterial insufficiency: Secondary | ICD-10-CM | POA: Diagnosis not present

## 2018-08-18 NOTE — Progress Notes (Signed)
Cardiology Office Note:    Date:  08/18/2018   ID:  Mitchell Lowery, DOB 08-08-1934, MRN [id number]  PCP:  [name], MD  Cardiologist:  Sherren Mocha, MD  Electrophysiologist:  None   Referring MD: [name], MD   Chief Complaint  Patient presents with  . Follow-up    aortic stenosis    History of Present Illness:    Mitchell Lowery is a 84 y.o. male with a hx of paroxysmal atrial fibrillation and longstanding heart murmur with moderate aortic stenosis, presenting for follow-up evaluation after a surveillance echocardiogram was performed earlier this week. The patient is chronically anticoagulated with rivaroxaban. He has not been a candidate for AV nodal blocking agents because of marked resting bradycardia when in sinus rhythm.  The patient is here alone today.  He has been doing fairly well from a cardiac perspective and denies any symptoms of chest pain, chest pressure, or shortness of breath.  He is more limited by gait instability and then any cardiopulmonary symptoms.  He does have some fatigue when he is in atrial fibrillation.  He brings with him results of his home monitorAnd he is having elevated heart rates about 1 day/week.  Even when he is in atrial fibrillation his heart rate is generally less than 100 bpm.  He is tolerated chronic oral anticoagulation with rivaroxaban and has not had any major bleeding problems.  He did have a mechanical fall recently but has not had lightheadedness or syncope.  Past Medical History:  Diagnosis Date  . Anal fistula    treated in [place]  . Anemia    only due to myeloma- "normal now"  . Heart murmur   . Hemorrhoids   . Multiple myeloma (HCC)    1 month remission now-last tx. 1 month ago- /[name]  . Prostate cancer [facility])    urinary retention, surgery planned-prior radiation, and seed implant about 1 year ago.  . Prostatitis   . Pulmonary embolus, left ([place]) 03/28/2016  . UTI (lower urinary tract infection)    multiple urinary trract infection- being tx presently with antibiotic at present.    Past Surgical History:  Procedure Laterality Date  . ANAL FISTULECTOMY    . ANKLE SURGERY    . CYSTOSCOPY    . CYSTOSCOPY WITH INSERTION OF UROLIFT N/A 03/12/2015   Procedure: CYSTOSCOPY WITH INSERTION OF UROLIFT;  Surgeon: Franchot Gallo, MD;  Location: [facility];  Service: Urology;  Laterality: N/A;  . PROSTATE BIOPSY    . PROSTATE BIOPSY    . RADIOACTIVE SEED IMPLANT N/A 07/21/2014   Procedure: RADIOACTIVE SEED IMPLANT/BRACHYTHERAPY IMPLANT;  Surgeon: Franchot Gallo, MD;  Location[facility];  Service: Urology;  Laterality: N/A;  . TONSILLECTOMY      Current Medications: Current Meds  Medication Sig  . Cholecalciferol 2000 units TABS Take 1 tablet by mouth daily.  Marland Kitchen dexamethasone (DECADRON) 4 MG tablet Take 20 mg by mouth once a week.  . diphenoxylate-atropine (LOMOTIL) 2.5-0.025 MG tablet Take 1 tablet by mouth daily as needed for diarrhea or loose stools. Usually takes 1 tablet a week  . IXAZOMIB CITRATE PO Take 4 mg by mouth once a week. 3 weeks on, 1 week off  . losartan (COZAAR) 50 MG tablet Take 50 mg by mouth daily.   . pomalidomide (POMALYST) 4 MG capsule 4 mg. TAKE 4 MG BY MOUTH FOR DAILY FOR 3 WEEKS THEN 1 WEEK OFF  . Psyllium (QC NATURAL VEGETABLE) 95 % POWD Take  by mouth.  . senna-docusate (SENNA S) 8.6-50 MG tablet Take 2 tablets by mouth daily.  . valACYclovir (VALTREX) 500 MG tablet Take 500 mg by mouth daily.   . vitamin B-12 (CYANOCOBALAMIN) 500 MCG tablet Take 500 mcg by mouth daily.  Alveda Reasons 20 MG TABS tablet TAKE 1 TABLET BY MOUTH DAILY WITH SUPPER.     Allergies:   Patient has no known allergies.   Social History   Socioeconomic History  . Marital status: Married    Spouse name: Not on file  . Number of children: Not on file  . Years of education: Not on file  . Highest education level: Not on file  Occupational History  . Not on file  Social  Needs  . Financial resource strain: Not on file  . Food insecurity    Worry: Not on file    Inability: Not on file  . Transportation needs    Medical: Not on file    Non-medical: Not on file  Tobacco Use  . Smoking status: Never Smoker  . Smokeless tobacco: Never Used  Substance and Sexual Activity  . Alcohol use: Yes    Comment: 1 glass wine daily  . Drug use: No  . Sexual activity: Yes  Lifestyle  . Physical activity    Days per week: Not on file    Minutes per session: Not on file  . Stress: Not on file  Relationships  . Social Herbalist on phone: Not on file    Gets together: Not on file    Attends religious service: Not on file    Active member of club or organization: Not on file    Attends meetings of clubs or organizations: Not on file    Relationship status: Not on file  Other Topics Concern  . Not on file  Social History Narrative  . Not on file     Family History: The patient's family history includes Cancer in his maternal grandfather.  ROS:   Please see the history of present illness.    All other systems reviewed and are negative.  EKGs/Labs/Other Studies Reviewed:    The following studies were reviewed today: 2D Echo 08/16/2018: IMPRESSIONS    1. The left ventricle has normal systolic function with an ejection fraction of 60-65%. The cavity size was normal. There is moderately increased left ventricular wall thickness. Left ventricular diastolic Doppler parameters are consistent with impaired  relaxation. Elevated left ventricular end-diastolic pressure The E/e' is 23.  2. The right ventricle has normal systolic function. The cavity was normal. There is no increase in right ventricular wall thickness.  3. The aortic valve is abnormal. Moderate calcification of the aortic valve. Moderate-severe stenosis of the aortic valve. Maximum velocity obtained in the apical view.  4. Mean systolic aortic valve gradient 35 mmHg, velocity 3.8 m/s,  peak gradient 58 mmHg. Using an LVOT diameter of 2cm and LVOT TVI of 25 cm, the aortic valve area calculates to 0.85 cm2.  5. The aortic root and ascending aorta are normal in size and structure.  6. Mildly dilated pulmonary artery.  7. When compared to the prior study: 06/30/17 - aortic stenosis has progressed from moderate to moderate-severe. No other significant change.  FINDINGS  Left Ventricle: The left ventricle has normal systolic function, with an ejection fraction of 60-65%. The cavity size was normal. There is moderately increased left ventricular wall thickness. Left ventricular diastolic Doppler parameters are consistent  with impaired relaxation.  Elevated left ventricular end-diastolic pressure The E/e' is 23.  Right Ventricle: The right ventricle has normal systolic function. The cavity was normal. There is no increase in right ventricular wall thickness.  Left Atrium: Left atrial size was mildly dilated.  Right Atrium: Right atrial size was normal in size. Right atrial pressure is estimated at 3 mmHg.  Interatrial Septum: No atrial level shunt detected by color flow Doppler.  Pericardium: There is no evidence of pericardial effusion.  Mitral Valve: The mitral valve is normal in structure. Mild thickening of the mitral valve leaflet. Mitral valve regurgitation is trivial by color flow Doppler.  Tricuspid Valve: The tricuspid valve is normal in structure. Tricuspid valve regurgitation is trivial by color flow Doppler.  Aortic Valve: The aortic valve is abnormal Moderate calcification of the aortic valve, with moderately decreased cusp excursion. Aortic valve regurgitation was not visualized by color flow Doppler. There is Moderate-severe stenosis of the aortic valve,  with a calculated valve area of 0.82 cm. Maximum velocity obtained in the apical view  Pulmonic Valve: The pulmonic valve was not well visualized. Pulmonic valve regurgitation is trivial by color flow  Doppler.  Aorta: The aortic root and ascending aorta are normal in size and structure.  Pulmonary Artery: The pulmonary artery is mildly dilated.  Venous: The inferior vena cava is normal in size with greater than 50% respiratory variability.  Compared to previous exam: 06/30/17 - aortic stenosis has progressed from moderate to moderate-severe. No other significant change.  Event Monitor 03/05/2018: Study Highlights  The basic rhythm is normal sinus with an average heart rate of 64 bpm. There is evidence of atrial fibrillation with the vast majority of ventricular rates less than 125 bpm.  The overall atrial fib burden is approximately 4% with the longest episode lasting approximately 5 hours There is one short run of nonsustained VT of 6 beats There are no pathologic pauses greater than 3 seconds.   EKG:  EKG is not ordered today.    Recent Labs: 07/12/2018: ALT 15; BUN 11; Creatinine 0.73; Hemoglobin 13.6; Hemoglobin 13.8; Magnesium 1.7; Platelet Count 116; Platelets 117; Potassium 3.8; Sodium 141  Recent Lipid Panel No results found for: CHOL, TRIG, HDL, CHOLHDL, VLDL, LDLCALC, LDLDIRECT  Physical Exam:    VS:  BP 120/66   Pulse 71   Ht '5\' 10"'  (1.778 m)   Wt 193 lb 12.8 oz (87.9 kg)   SpO2 95%   BMI 27.81 kg/m     Wt Readings from Last 3 Encounters:  08/18/18 193 lb 12.8 oz (87.9 kg)  03/22/18 187 lb 14.4 oz (85.2 kg)  02/24/18 190 lb 3.2 oz (86.3 kg)     GEN:  Well nourished, well developed elderly male in no acute distress HEENT: Normal NECK: No JVD; No carotid bruits LYMPHATICS: No lymphadenopathy CARDIAC: RRR, 2/6 harsh mid peaking systolic murmur best heard at the right upper sternal border, A2 is present RESPIRATORY:  Clear to auscultation without rales, wheezing or rhonchi  ABDOMEN: Soft, non-tender, non-distended MUSCULOSKELETAL: 1+ bilateral pretibial edema; No deformity  SKIN: Warm and dry NEUROLOGIC:  Alert and oriented x 3 PSYCHIATRIC:  Normal affect    ASSESSMENT:    1. Nonrheumatic aortic valve stenosis   2. Paroxysmal atrial fibrillation (HCC)    PLAN:    In order of problems listed above:  1. I personally reviewed the patient's echo images today.  He does have moderate calcification and restriction of the aortic valve leaflets with Doppler data suggestive of at  least moderate and probably moderately severe aortic stenosis.  His mean transvalvular gradient is 35 mmHg and dimensionless index is 0.26.  Since 2017, his mean gradient is increased from 17 mmHg to 26 mmHg to 35 mmHg on the current study.  He does not have any cardinal symptoms of aortic stenosis.  We discussed the natural history of aortic stenosis at length today.  I will plan to see him back in 1 year with a surveillance echocardiogram at that time.  He understands to watch out for chest discomfort, shortness of breath, or lightheadedness/syncope. 2. The patient is tolerating anticoagulation and has no overt bleeding problems.  He has minimal symptoms related to atrial fibrillation and we will continue on his current treatment strategy with rivaroxaban.  He is not a candidate for beta-blockade because of resting bradycardia.   Medication Adjustments/Labs and Tests Ordered: Current medicines are reviewed at length with the patient today.  Concerns regarding medicines are outlined above.  Orders Placed This Encounter  Procedures  . ECHOCARDIOGRAM COMPLETE   No orders of the defined types were placed in this encounter.   Patient Instructions  Medication Instructions:  Your provider recommends that you continue on your current medications as directed. Please refer to the Current Medication list given to you today.    Labwork: None  Testing/Procedures: Your provider has requested that you have an echocardiogram in 1 year. Echocardiography is a painless test that uses sound waves to create images of your heart. It provides your doctor with information about the size and  shape of your heart and how well your heart's chambers and valves are working. This procedure takes approximately one hour. There are no restrictions for this procedure.  Follow-Up: Your provider recommends that you schedule a follow-up appointment in 1 year. You will be called to arrange your echo and appointment with Dr. [facility] Knack when his schedule is open.    Signed, Sherren Mocha, MD  08/18/2018 2:44 PM    [place]

## 2018-08-18 NOTE — Patient Instructions (Signed)
Medication Instructions:  Your provider recommends that you continue on your current medications as directed. Please refer to the Current Medication list given to you today.    Labwork: None  Testing/Procedures: Your provider has requested that you have an echocardiogram in 1 year. Echocardiography is a painless test that uses sound waves to create images of your heart. It provides your doctor with information about the size and shape of your heart and how well your heart's chambers and valves are working. This procedure takes approximately one hour. There are no restrictions for this procedure.  Follow-Up: Your provider recommends that you schedule a follow-up appointment in 1 year. You will be called to arrange your echo and appointment with Dr. [facility] Knack when his schedule is open.

## 2018-08-18 NOTE — Telephone Encounter (Signed)

## 2018-08-25 MED FILL — XARELTO 20 MG TABLET: 20 | 30 days supply | Qty: 30 | Fill #5

## 2018-09-02 ENCOUNTER — Telehealth: Payer: Self-pay | Admitting: *Deleted

## 2018-09-02 NOTE — Telephone Encounter (Signed)
Called to request a lab appointment for 09/06/18 per request of [facility] study. Informed him that orders have not yet been received here. He will f/u with study nurse to get them sent. Sent email to his study nurse requesting lab orders.

## 2018-09-03 ENCOUNTER — Telehealth: Payer: Self-pay | Admitting: *Deleted

## 2018-09-03 DIAGNOSIS — C9000 Multiple myeloma not having achieved remission: Secondary | ICD-10-CM

## 2018-09-03 NOTE — Telephone Encounter (Addendum)
Call from [place] at [facility] requesting to repeat same labs done in June on 09/06/18. Provided appointment time for patient. On 09/05/88: Email sent from [place] at [facility] on 09/05/18--cancel labs. Not needed per Dr. Amalia Hailey. Patient notified as well.

## 2018-09-06 ENCOUNTER — Inpatient Hospital Stay: Payer: Medicare Other | Attending: Oncology

## 2018-09-06 ENCOUNTER — Other Ambulatory Visit: Payer: Self-pay | Admitting: *Deleted

## 2018-09-06 ENCOUNTER — Other Ambulatory Visit: Payer: Self-pay

## 2018-09-06 ENCOUNTER — Inpatient Hospital Stay: Payer: Medicare Other

## 2018-09-06 DIAGNOSIS — C9000 Multiple myeloma not having achieved remission: Secondary | ICD-10-CM

## 2018-09-06 LAB — CMP (CANCER CENTER ONLY)
ALT: 11 U/L (ref 0–44)
AST: 11 U/L — ABNORMAL LOW (ref 15–41)
Albumin: 3.1 g/dL — ABNORMAL LOW (ref 3.5–5.0)
Alkaline Phosphatase: 42 U/L (ref 38–126)
Anion gap: 9 (ref 5–15)
BUN: 9 mg/dL (ref 8–23)
CO2: 25 mmol/L (ref 22–32)
Calcium: 8.9 mg/dL (ref 8.9–10.3)
Chloride: 106 mmol/L (ref 98–111)
Creatinine: 0.76 mg/dL (ref 0.61–1.24)
GFR, Est AFR Am: >60 mL/min (ref >60)
GFR, Estimated: >60 mL/min (ref >60)
Glucose, Bld: 87 mg/dL (ref 70–99)
Potassium: 4 mmol/L (ref 3.5–5.1)
Sodium: 140 mmol/L (ref 135–145)
Total Bilirubin: 0.7 mg/dL (ref 0.3–1.2)
Total Protein: 5.6 g/dL — ABNORMAL LOW (ref 6.5–8.1)

## 2018-09-06 LAB — CBC WITH DIFFERENTIAL (CANCER CENTER ONLY)
Abs Immature Granulocytes: 0.01 10*3/uL (ref 0.00–0.07)
Basophils Absolute: 0.1 10*3/uL (ref 0.0–0.1)
Basophils Relative: 1 %
Eosinophils Absolute: 0.2 10*3/uL (ref 0.0–0.5)
Eosinophils Relative: 5 %
HCT: 41.4 % (ref 39.0–52.0)
Hemoglobin: 12.9 g/dL — ABNORMAL LOW (ref 13.0–17.0)
Immature Granulocytes: 0 %
Lymphocytes Relative: 23 %
Lymphs Abs: 0.9 10*3/uL (ref 0.7–4.0)
MCH: 28 pg (ref 26.0–34.0)
MCHC: 31.2 g/dL (ref 30.0–36.0)
MCV: 90 fL (ref 80.0–100.0)
Monocytes Absolute: 1 10*3/uL (ref 0.1–1.0)
Monocytes Relative: 26 %
Neutro Abs: 1.7 10*3/uL (ref 1.7–7.7)
Neutrophils Relative %: 45 %
Platelet Count: 99 10*3/uL — ABNORMAL LOW (ref 150–400)
RBC: 4.6 MIL/uL (ref 4.22–5.81)
RDW: 17.1 % — ABNORMAL HIGH (ref 11.5–15.5)
WBC Count: 3.9 10*3/uL — ABNORMAL LOW (ref 4.0–10.5)
nRBC: 0 % (ref 0.0–0.2)

## 2018-09-06 LAB — MAGNESIUM: Magnesium: 1.7 mg/dL (ref 1.7–2.4)

## 2018-09-06 LAB — PHOSPHORUS: Phosphorus: 3.5 mg/dL (ref 2.5–4.6)

## 2018-09-06 NOTE — Progress Notes (Addendum)
Call and new email from [place] at [facility] that patient does need his labs today. Called patient and scheduled for 1045 today. @ 1609: Faxed CMP,Mg+,Phos, and CBC/diff to Patty at [facility] #[phone] 09/09/18: Faxed remaining lab results to [facility].

## 2018-09-07 DIAGNOSIS — C9001 Multiple myeloma in remission: Secondary | ICD-10-CM | POA: Diagnosis not present

## 2018-09-07 DIAGNOSIS — K0889 Other specified disorders of teeth and supporting structures: Secondary | ICD-10-CM | POA: Diagnosis not present

## 2018-09-07 DIAGNOSIS — D61818 Other pancytopenia: Secondary | ICD-10-CM | POA: Diagnosis not present

## 2018-09-07 DIAGNOSIS — I4891 Unspecified atrial fibrillation: Secondary | ICD-10-CM | POA: Diagnosis not present

## 2018-09-07 DIAGNOSIS — G629 Polyneuropathy, unspecified: Secondary | ICD-10-CM | POA: Diagnosis not present

## 2018-09-07 DIAGNOSIS — W19XXXA Unspecified fall, initial encounter: Secondary | ICD-10-CM | POA: Diagnosis not present

## 2018-09-07 DIAGNOSIS — E291 Testicular hypofunction: Secondary | ICD-10-CM | POA: Diagnosis not present

## 2018-09-07 DIAGNOSIS — Z006 Encounter for examination for normal comparison and control in clinical research program: Secondary | ICD-10-CM | POA: Diagnosis not present

## 2018-09-07 LAB — PROTEIN ELECTROPHORESIS, SERUM
A/G Ratio: 1.4 (ref 0.7–1.7)
Albumin ELP: 3 g/dL (ref 2.9–4.4)
Alpha-1-Globulin: 0.2 g/dL (ref 0.0–0.4)
Alpha-2-Globulin: 0.8 g/dL (ref 0.4–1.0)
Beta Globulin: 0.8 g/dL (ref 0.7–1.3)
Gamma Globulin: 0.3 g/dL — ABNORMAL LOW (ref 0.4–1.8)
Globulin, Total: 2.1 g/dL — ABNORMAL LOW (ref 2.2–3.9)
Total Protein ELP: 5.1 g/dL — ABNORMAL LOW (ref 6.0–8.5)

## 2018-09-07 LAB — IMMUNOFIXATION ELECTROPHORESIS
IgA: 188 mg/dL (ref 61–437)
IgG (Immunoglobin G), Serum: 336 mg/dL — ABNORMAL LOW (ref 603–1613)
IgM (Immunoglobulin M), Srm: 27 mg/dL (ref 15–143)
Total Protein ELP: 5.1 g/dL — ABNORMAL LOW (ref 6.0–8.5)

## 2018-09-07 LAB — KAPPA/LAMBDA LIGHT CHAINS
Kappa free light chain: 7.2 mg/L (ref 3.3–19.4)
Kappa, lambda light chain ratio: 0.1 — ABNORMAL LOW (ref 0.26–1.65)
Lambda free light chains: 71 mg/L — ABNORMAL HIGH (ref 5.7–26.3)

## 2018-09-07 LAB — IGG: IgG (Immunoglobin G), Serum: 336 mg/dL — ABNORMAL LOW (ref 603–1613)

## 2018-09-07 LAB — IGA: IgA: 195 mg/dL (ref 61–437)

## 2018-09-07 LAB — IGM: IgM (Immunoglobulin M), Srm: 31 mg/dL (ref 15–143)

## 2018-09-27 DIAGNOSIS — D61818 Other pancytopenia: Secondary | ICD-10-CM | POA: Diagnosis not present

## 2018-09-27 DIAGNOSIS — E559 Vitamin D deficiency, unspecified: Secondary | ICD-10-CM | POA: Diagnosis not present

## 2018-09-27 DIAGNOSIS — C9000 Multiple myeloma not having achieved remission: Secondary | ICD-10-CM | POA: Diagnosis not present

## 2018-09-27 DIAGNOSIS — C61 Malignant neoplasm of prostate: Secondary | ICD-10-CM | POA: Diagnosis not present

## 2018-09-27 DIAGNOSIS — K521 Toxic gastroenteritis and colitis: Secondary | ICD-10-CM | POA: Diagnosis not present

## 2018-09-27 DIAGNOSIS — I10 Essential (primary) hypertension: Secondary | ICD-10-CM | POA: Diagnosis not present

## 2018-09-27 DIAGNOSIS — K5900 Constipation, unspecified: Secondary | ICD-10-CM | POA: Diagnosis not present

## 2018-09-27 DIAGNOSIS — N400 Enlarged prostate without lower urinary tract symptoms: Secondary | ICD-10-CM | POA: Diagnosis not present

## 2018-09-27 DIAGNOSIS — Z86711 Personal history of pulmonary embolism: Secondary | ICD-10-CM | POA: Diagnosis not present

## 2018-09-27 DIAGNOSIS — Z23 Encounter for immunization: Secondary | ICD-10-CM | POA: Diagnosis not present

## 2018-09-27 DIAGNOSIS — E538 Deficiency of other specified B group vitamins: Secondary | ICD-10-CM | POA: Diagnosis not present

## 2018-09-27 MED FILL — XARELTO 20 MG TABLET: 20 | 30 days supply | Qty: 30 | Fill #6

## 2018-10-01 DIAGNOSIS — E559 Vitamin D deficiency, unspecified: Secondary | ICD-10-CM | POA: Diagnosis not present

## 2018-10-01 DIAGNOSIS — E538 Deficiency of other specified B group vitamins: Secondary | ICD-10-CM | POA: Diagnosis not present

## 2018-10-05 DIAGNOSIS — G620 Drug-induced polyneuropathy: Secondary | ICD-10-CM | POA: Diagnosis not present

## 2018-10-05 DIAGNOSIS — Z6827 Body mass index (BMI) 27.0-27.9, adult: Secondary | ICD-10-CM | POA: Diagnosis not present

## 2018-10-05 DIAGNOSIS — I2699 Other pulmonary embolism without acute cor pulmonale: Secondary | ICD-10-CM | POA: Diagnosis not present

## 2018-10-05 DIAGNOSIS — C9001 Multiple myeloma in remission: Secondary | ICD-10-CM | POA: Diagnosis not present

## 2018-10-05 DIAGNOSIS — D61810 Antineoplastic chemotherapy induced pancytopenia: Secondary | ICD-10-CM | POA: Diagnosis not present

## 2018-10-05 DIAGNOSIS — T451X5A Adverse effect of antineoplastic and immunosuppressive drugs, initial encounter: Secondary | ICD-10-CM | POA: Diagnosis not present

## 2018-10-05 DIAGNOSIS — Z006 Encounter for examination for normal comparison and control in clinical research program: Secondary | ICD-10-CM | POA: Diagnosis not present

## 2018-10-19 ENCOUNTER — Telehealth: Payer: Self-pay | Admitting: Cardiovascular Disease

## 2018-10-19 NOTE — Telephone Encounter (Signed)
I spoke to the patient who is calling because his HR is in the 60s, but says that he has trouble increasing it, even with exertion.  He feels tired/weak occasionally with no other symptoms.  His BP is good 130/60, but would like to discuss this with a provider.  I scheduled a visit with Vin in October to f/u.  He verbalized understanding.

## 2018-10-19 NOTE — Telephone Encounter (Signed)
New Message  STAT if HR is under 50 or over 120 (normal HR is 60-100 beats per minute)  1) What is your heart rate? 60  2) Do you have a log of your heart rate readings (document readings)? States that is usually runs around 120, but has been ranging in the 60s and 70s. States that while it is this low that he becomes tired.  3) Do you have any other symptoms? Tired (low energy) and unable to exercise.  Patient wants to know should he wait to do a 6 month follow up or should he schedule to come in sooner.

## 2018-10-22 ENCOUNTER — Telehealth: Payer: Self-pay | Admitting: *Deleted

## 2018-10-22 DIAGNOSIS — C9000 Multiple myeloma not having achieved remission: Secondary | ICD-10-CM

## 2018-10-22 NOTE — Telephone Encounter (Signed)
Patient needs his same study labs collected on 11/01/18 and faxed to her at [phone]. Orders entered and scheduling message sent.

## 2018-10-25 ENCOUNTER — Telehealth: Payer: Self-pay | Admitting: Oncology

## 2018-10-25 MED FILL — XARELTO 20 MG TABLET: 20 | 30 days supply | Qty: 30 | Fill #7

## 2018-10-25 NOTE — Telephone Encounter (Signed)
Scheduled appt per 9/25 sch message = pt aware of appt date and time

## 2018-10-28 DIAGNOSIS — Z961 Presence of intraocular lens: Secondary | ICD-10-CM | POA: Diagnosis not present

## 2018-10-28 DIAGNOSIS — H524 Presbyopia: Secondary | ICD-10-CM | POA: Diagnosis not present

## 2018-11-01 ENCOUNTER — Inpatient Hospital Stay: Payer: Medicare Other | Attending: Oncology

## 2018-11-01 ENCOUNTER — Other Ambulatory Visit: Payer: Self-pay

## 2018-11-01 DIAGNOSIS — C9000 Multiple myeloma not having achieved remission: Secondary | ICD-10-CM | POA: Insufficient documentation

## 2018-11-01 LAB — CMP (CANCER CENTER ONLY)
ALT: 11 U/L (ref 0–44)
AST: 13 U/L — ABNORMAL LOW (ref 15–41)
Albumin: 3.2 g/dL — ABNORMAL LOW (ref 3.5–5.0)
Alkaline Phosphatase: 40 U/L (ref 38–126)
Anion gap: 8 (ref 5–15)
BUN: 12 mg/dL (ref 8–23)
CO2: 28 mmol/L (ref 22–32)
Calcium: 8.8 mg/dL — ABNORMAL LOW (ref 8.9–10.3)
Chloride: 106 mmol/L (ref 98–111)
Creatinine: 0.76 mg/dL (ref 0.61–1.24)
GFR, Est AFR Am: >60 mL/min (ref >60)
GFR, Estimated: >60 mL/min (ref >60)
Glucose, Bld: 94 mg/dL (ref 70–99)
Potassium: 3.9 mmol/L (ref 3.5–5.1)
Sodium: 142 mmol/L (ref 135–145)
Total Bilirubin: 0.7 mg/dL (ref 0.3–1.2)
Total Protein: 5.5 g/dL — ABNORMAL LOW (ref 6.5–8.1)

## 2018-11-01 LAB — CBC WITH DIFFERENTIAL (CANCER CENTER ONLY)
Abs Immature Granulocytes: 0 10*3/uL (ref 0.00–0.07)
Basophils Absolute: 0.1 10*3/uL (ref 0.0–0.1)
Basophils Relative: 2 %
Eosinophils Absolute: 0.4 10*3/uL (ref 0.0–0.5)
Eosinophils Relative: 11 %
HCT: 43.5 % (ref 39.0–52.0)
Hemoglobin: 13.5 g/dL (ref 13.0–17.0)
Immature Granulocytes: 0 %
Lymphocytes Relative: 28 %
Lymphs Abs: 1.1 10*3/uL (ref 0.7–4.0)
MCH: 27.2 pg (ref 26.0–34.0)
MCHC: 31 g/dL (ref 30.0–36.0)
MCV: 87.5 fL (ref 80.0–100.0)
Monocytes Absolute: 0.8 10*3/uL (ref 0.1–1.0)
Monocytes Relative: 20 %
Neutro Abs: 1.5 10*3/uL — ABNORMAL LOW (ref 1.7–7.7)
Neutrophils Relative %: 39 %
Platelet Count: 127 10*3/uL — ABNORMAL LOW (ref 150–400)
RBC: 4.97 MIL/uL (ref 4.22–5.81)
RDW: 16.5 % — ABNORMAL HIGH (ref 11.5–15.5)
WBC Count: 3.9 10*3/uL — ABNORMAL LOW (ref 4.0–10.5)
nRBC: 0 % (ref 0.0–0.2)

## 2018-11-01 LAB — PHOSPHORUS: Phosphorus: 3.1 mg/dL (ref 2.5–4.6)

## 2018-11-01 LAB — MAGNESIUM: Magnesium: 1.7 mg/dL (ref 1.7–2.4)

## 2018-11-02 DIAGNOSIS — Z006 Encounter for examination for normal comparison and control in clinical research program: Secondary | ICD-10-CM | POA: Diagnosis not present

## 2018-11-02 DIAGNOSIS — C9001 Multiple myeloma in remission: Secondary | ICD-10-CM | POA: Diagnosis not present

## 2018-11-02 DIAGNOSIS — D61810 Antineoplastic chemotherapy induced pancytopenia: Secondary | ICD-10-CM | POA: Diagnosis not present

## 2018-11-02 LAB — UPEP/UIFE/LIGHT CHAINS/TP, 24-HR UR
% BETA, Urine: 0 %
ALPHA 1 URINE: 0 %
Albumin, U: 100 %
Alpha 2, Urine: 0 %
Free Kappa Lt Chains,Ur: 6.77 mg/L (ref 0.63–113.79)
Free Kappa/Lambda Ratio: 0.55 — ABNORMAL LOW (ref 1.03–31.76)
Free Lambda Lt Chains,Ur: 12.22 mg/L — ABNORMAL HIGH (ref 0.47–11.77)
GAMMA GLOBULIN URINE: 0 %
Total Protein, Urine-Ur/day: 74 mg/24 hr (ref 30–150)
Total Protein, Urine: 6.2 mg/dL
Total Volume: 1200

## 2018-11-02 LAB — KAPPA/LAMBDA LIGHT CHAINS
Kappa free light chain: 7.6 mg/L (ref 3.3–19.4)
Kappa, lambda light chain ratio: 0.09 — ABNORMAL LOW (ref 0.26–1.65)
Lambda free light chains: 83.1 mg/L — ABNORMAL HIGH (ref 5.7–26.3)

## 2018-11-02 LAB — PROTEIN ELECTROPHORESIS, SERUM
A/G Ratio: 1.6 (ref 0.7–1.7)
Albumin ELP: 3.2 g/dL (ref 2.9–4.4)
Alpha-1-Globulin: 0.2 g/dL (ref 0.0–0.4)
Alpha-2-Globulin: 0.7 g/dL (ref 0.4–1.0)
Beta Globulin: 0.8 g/dL (ref 0.7–1.3)
Gamma Globulin: 0.3 g/dL — ABNORMAL LOW (ref 0.4–1.8)
Globulin, Total: 2 g/dL — ABNORMAL LOW (ref 2.2–3.9)
Total Protein ELP: 5.2 g/dL — ABNORMAL LOW (ref 6.0–8.5)

## 2018-11-02 LAB — IGG, IGA, IGM
IgA: 131 mg/dL (ref 61–437)
IgG (Immunoglobin G), Serum: 336 mg/dL — ABNORMAL LOW (ref 603–1613)
IgM (Immunoglobulin M), Srm: 24 mg/dL (ref 15–143)

## 2018-11-03 DIAGNOSIS — N5201 Erectile dysfunction due to arterial insufficiency: Secondary | ICD-10-CM | POA: Diagnosis not present

## 2018-11-03 DIAGNOSIS — N400 Enlarged prostate without lower urinary tract symptoms: Secondary | ICD-10-CM | POA: Diagnosis not present

## 2018-11-03 DIAGNOSIS — Z8546 Personal history of malignant neoplasm of prostate: Secondary | ICD-10-CM | POA: Diagnosis not present

## 2018-11-03 DIAGNOSIS — E291 Testicular hypofunction: Secondary | ICD-10-CM | POA: Diagnosis not present

## 2018-11-03 LAB — IMMUNOFIXATION ELECTROPHORESIS
IgA: 133 mg/dL (ref 61–437)
IgG (Immunoglobin G), Serum: 309 mg/dL — ABNORMAL LOW (ref 603–1613)
IgM (Immunoglobulin M), Srm: 22 mg/dL (ref 15–143)
Total Protein ELP: 5.2 g/dL — ABNORMAL LOW (ref 6.0–8.5)

## 2018-11-04 ENCOUNTER — Encounter: Payer: Self-pay | Admitting: *Deleted

## 2018-11-04 NOTE — Progress Notes (Signed)
Faxed remaining lab results from 11/01/18 draw to research nurse, Patty at [facility]

## 2018-11-16 NOTE — Progress Notes (Signed)
Cardiology Office Note    Date:  11/17/2018   ID:  Mitchell Lowery, DOB Aug 24, 1934, MRN [id number]  PCP:  Mitchell Huddle, MD  Cardiologist: Dr. Burt Knack  Chief Complaint: Slow HR/weakness  History of Present Illness:   Mitchell Lowery is a 84 y.o. male with hx of PAF on xarelto, moderate aortic stenosis and multiple myeloma presents for slow HR.  Some fatigue when in atrial fibrillation. He is not on any rate control agent due to baseline bradycardia.  Monitor 03/05/2018:  The basic rhythm is normal sinus with an average heart rate of 64 bpm. There is evidence of atrial fibrillation with the vast majority of ventricular rates less than 125 bpm.  The overall atrial fib burden is approximately 4% with the longest episode lasting approximately 5 hours There is one short run of nonsustained VT of 6 beats There are no pathologic pauses greater than 3 seconds.  Last seen by Dr. Burt Knack 08/18/2018 after routine echo which showed increased mean gradient to 17m Hg from 26 mm hg. Recommended yearly echo.  Seen today for slow HR. He reports resting heart rate of 50s which increased to 70s to 80s with walking.  However, recently his heart rate not going up after ambulation.  He reports some fatigue with activity.  He thinks this is due to not getting heart rate up.  He does not think this is psychological.  He denies chest pain, shortness of breath, dizziness, syncope or melena.  He is in clinical trial for multiple myeloma for which he takes dexamethasone once every week.  Next day after taking steroids his heart rate usually runs higher in 70 to 80s at rest which correlates with 2 days heart rate of 85.  He is in sinus rhythm by exam.  He does not think he is going in and out of atrial fibrillation.  Reviewed home heart rate records.  Past Medical History:  Diagnosis Date  . [place]    [place] Anemia    only due to myeloma- "normal now"  . Heart murmur   . Hemorrhoids   .  Multiple myeloma (HCC)    1 month remission now-last tx. 1 month ago- /Dr. SBenay Spice . Prostate cancer ([facility]    urinary retention, surgery planned-prior radiation, and seed implant about 1 year ago.  . Prostatitis   . [place]  . UTI (lower urinary tract infection)    multiple urinary trract infection- being tx presently with antibiotic at present.    Past Surgical History:  Procedure Laterality Date  . ANAL FISTULECTOMY    . ANKLE SURGERY    . CYSTOSCOPY    . CYSTOSCOPY WITH INSERTION OF UROLIFT N/A 03/12/2015   Procedure: CYSTOSCOPY WITH INSERTION OF UROLIFT;  Surgeon: SFranchot Gallo MD;  Location: [facility];  Service: Urology;  Laterality: N/A;  . PROSTATE BIOPSY    . PROSTATE BIOPSY    . RADIOACTIVE SEED IMPLANT N/A 07/21/2014   Procedure: RADIOACTIVE SEED IMPLANT/BRACHYTHERAPY IMPLANT;  Surgeon: SFranchot Gallo MD;  [facility]  Service: Urology;  Laterality: N/A;  . TONSILLECTOMY      Current Medications: Prior to Admission medications   Medication Sig Start Date End Date Taking? Authorizing Provider  Cholecalciferol 2000 units TABS Take 1 tablet by mouth daily.    [provider]  dexamethasone (DECADRON) 4 MG tablet Take 20 mg by mouth once a week. 03/10/17   [provider]  diphenoxylate-atropine (LOMOTIL) 2.5-0.025 MG tablet Take 1 tablet by mouth daily as needed for diarrhea or loose stools. Usually takes 1 tablet a week 03/11/16   [provider]  IXAZOMIB CITRATE PO Take 4 mg by mouth once a week. 3 weeks on, 1 week off 03/10/17   [provider]  losartan (COZAAR) 50 MG tablet Take 50 mg by mouth daily.     [provider]  pomalidomide (POMALYST) 4 MG capsule 4 mg. TAKE 4 MG BY MOUTH FOR DAILY FOR 3 WEEKS THEN 1 WEEK OFF    [provider]  Psyllium (QC NATURAL VEGETABLE) 95 % POWD Take by mouth.    [provider]  senna-docusate (SENNA S) 8.6-50 MG  tablet Take 2 tablets by mouth daily. 12/16/17   Ladell Pier, MD  valACYclovir (VALTREX) 500 MG tablet Take 500 mg by mouth daily.  12/03/17   [provider]  vitamin B-12 (CYANOCOBALAMIN) 500 MCG tablet Take 500 mcg by mouth daily.    [provider]  XARELTO 20 MG TABS tablet TAKE 1 TABLET BY MOUTH DAILY WITH SUPPER. 03/30/18   Ladell Pier, MD    Allergies:   Patient has no known allergies.   Social History   Socioeconomic History  . Marital status: Married    Spouse name: Not on file  . Number of children: Not on file  . Years of education: Not on file  . Highest education level: Not on file  Occupational History  . Not on file  Social Needs  . Financial resource strain: Not on file  . Food insecurity    Worry: Not on file    Inability: Not on file  . Transportation needs    Medical: Not on file    Non-medical: Not on file  Tobacco Use  . Smoking status: Never Smoker  . Smokeless tobacco: Never Used  Substance and Sexual Activity  . Alcohol use: Yes    Comment: 1 glass wine daily  . Drug use: No  . Sexual activity: Yes  Lifestyle  . Physical activity    Days per week: Not on file    Minutes per session: Not on file  . Stress: Not on file  Relationships  . Social Herbalist on phone: Not on file    Gets together: Not on file    Attends religious service: Not on file    Active member of club or organization: Not on file    Attends meetings of clubs or organizations: Not on file    Relationship status: Not on file  Other Topics Concern  . Not on file  Social History Narrative  . Not on file     Family History:  The patient's family history includes Cancer in his maternal grandfather.   ROS:   Please see the history of present illness.    ROS All other systems reviewed and are negative.   PHYSICAL EXAM:   VS:  BP 130/62   Pulse 85   Ht '5\' 10"'  (1.778 m)   Wt 191 lb 12.8 oz (87 kg)   PF 96 L/min   BMI 27.52 kg/m     GEN: Well nourished, well developed, in no acute distress  HEENT: normal  Neck: no JVD, carotid bruits, or masses Cardiac: RRR; no murmurs, rubs, or gallops,no edema  Respiratory:  clear to auscultation bilaterally, normal work of breathing GI: soft, nontender, nondistended, + BS MS: no deformity or atrophy  Skin:  warm and dry, no rash Neuro:  Alert and Oriented x 3, Strength and sensation are intact Psych: euthymic mood, full affect  Wt Readings from Last 3 Encounters:  11/17/18 191 lb 12.8 oz (87 kg)  08/18/18 193 lb 12.8 oz (87.9 kg)  03/22/18 187 lb 14.4 oz (85.2 kg)      Studies/Labs Reviewed:   EKG:  EKG is not ordered today.    Recent Labs: 11/01/2018: ALT 11; BUN 12; Creatinine 0.76; Hemoglobin 13.5; Magnesium 1.7; Platelet Count 127; Potassium 3.9; Sodium 142   Lipid Panel No results found for: CHOL, TRIG, HDL, CHOLHDL, VLDL, LDLCALC, LDLDIRECT  Additional studies/ records that were reviewed today include:   Echocardiogram: 08/16/2018  1. The left ventricle has normal systolic function with an ejection fraction of 60-65%. The cavity size was normal. There is moderately increased left ventricular wall thickness. Left ventricular diastolic Doppler parameters are consistent with impaired  relaxation. Elevated left ventricular end-diastolic pressure The E/e' is 23.  2. The right ventricle has normal systolic function. The cavity was normal. There is no increase in right ventricular wall thickness.  3. The aortic valve is abnormal. Moderate calcification of the aortic valve. Moderate-severe stenosis of the aortic valve. Maximum velocity obtained in the apical view.  4. Mean systolic aortic valve gradient 35 mmHg, velocity 3.8 m/s, peak gradient 58 mmHg. Using an LVOT diameter of 2cm and LVOT TVI of 25 cm, the aortic valve area calculates to 0.85 cm2.  5. The aortic root and ascending aorta are normal in size and structure.  6. Mildly dilated pulmonary artery.  7. When  compared to the prior study: 06/30/17 - aortic stenosis has progressed from moderate to moderate-severe. No other significant change.  Monitor 03/05/2018:  The basic rhythm is normal sinus with an average heart rate of 64 bpm. There is evidence of atrial fibrillation with the vast majority of ventricular rates less than 125 bpm.  The overall atrial fib burden is approximately 4% with the longest episode lasting approximately 5 hours There is one short run of nonsustained VT of 6 beats There are no pathologic pauses greater than 3 seconds.  ASSESSMENT & PLAN:    1. Paroxysmal atrial fibrillation -Maintaining sinus rhythm by exam.  Continue Xarelto for anticoagulation.  No bleeding issue. -Resting heart rate of 50s which is higher after taking steroid once a week for his multiple myeloma. -He thinks he is not able to increase his heart rate after ambulation which he used to do before.  Exertional fatigue without any associated symptoms.  He does not think this is psychological.  Heart rate increased to 90 after ambulation in hallway.  Discussed different With Event Monitor, Holter Monitor, EP Evaluation or Apple Watch Status Fitbit.  I Do Not Think He Has Paroxysmal Atrial Fibrillation or Significant Bradycardia.  Patient Will Get Apple Watch or fitbit and Keep Eye on His Pulse/heart rate.     Medication Adjustments/Labs and Tests Ordered: Current medicines are reviewed at length with the patient today.  Concerns regarding medicines are outlined above.  Medication changes, Labs and Tests ordered today are listed in the Patient Instructions below. Patient Instructions  Medication Instructions:  Your physician recommends that you continue on your current medications as directed. Please refer to the Current Medication list given to you today.  *If you need a refill on your cardiac medications before your next appointment, please call your pharmacy*  Lab Work: None ordered  If you have labs (blood  work) drawn today  and your tests are completely normal, you will receive your results only by: Marland Kitchen MyChart Message (if you have MyChart) OR . A paper copy in the mail If you have any lab test that is abnormal or we need to change your treatment, we will call you to review the results.  Testing/Procedures: None ordered  Follow-Up: At [facility], you and your health needs are our priority.  As part of our continuing mission to provide you with exceptional heart care, we have created designated Provider Care Teams.  These Care Teams include your primary Cardiologist (physician) and Advanced Practice Providers (APPs -  Physician Assistants and Nurse Practitioners) who all work together to provide you with the care you need, when you need it.  Your next appointment:   4 months 03/23/2019 9:00 a.m.  The format for your next appointment:   In Person  Provider:   Sherren Mocha, MD  Other Instructions      Signed, Leanor Kail, [place]  11/17/2018 10:23 AM    [facility] [address], [place], [place]  [address] Phone: [phone]; Fax: [phone]

## 2018-11-17 ENCOUNTER — Ambulatory Visit (INDEPENDENT_AMBULATORY_CARE_PROVIDER_SITE_OTHER): Payer: Medicare Other | Admitting: Physician Assistant

## 2018-11-17 ENCOUNTER — Encounter: Payer: Self-pay | Admitting: Physician Assistant

## 2018-11-17 ENCOUNTER — Other Ambulatory Visit: Payer: Self-pay

## 2018-11-17 VITALS — BP 130/62 | HR 85 | Ht 70.0 in | Wt 191.8 lb

## 2018-11-17 DIAGNOSIS — R001 Bradycardia, unspecified: Secondary | ICD-10-CM | POA: Diagnosis not present

## 2018-11-17 DIAGNOSIS — I480 Paroxysmal atrial fibrillation: Secondary | ICD-10-CM

## 2018-11-17 NOTE — Patient Instructions (Signed)
Medication Instructions:  Your physician recommends that you continue on your current medications as directed. Please refer to the Current Medication list given to you today.  *If you need a refill on your cardiac medications before your next appointment, please call your pharmacy*  Lab Work: None ordered  If you have labs (blood work) drawn today and your tests are completely normal, you will receive your results only by: Marland Kitchen MyChart Message (if you have MyChart) OR . A paper copy in the mail If you have any lab test that is abnormal or we need to change your treatment, we will call you to review the results.  Testing/Procedures: None ordered  Follow-Up: At [facility], you and your health needs are our priority.  As part of our continuing mission to provide you with exceptional heart care, we have created designated Provider Care Teams.  These Care Teams include your primary Cardiologist (physician) and Advanced Practice Providers (APPs -  Physician Assistants and Nurse Practitioners) who all work together to provide you with the care you need, when you need it.  Your next appointment:   4 months 03/23/2019 9:00 a.m.  The format for your next appointment:   In Person  Provider:   Sherren Mocha, MD  Other Instructions

## 2018-11-25 MED FILL — XARELTO 20 MG TABLET: 20 | 30 days supply | Qty: 30 | Fill #8

## 2018-11-30 DIAGNOSIS — R0600 Dyspnea, unspecified: Secondary | ICD-10-CM | POA: Diagnosis not present

## 2018-11-30 DIAGNOSIS — Z6826 Body mass index (BMI) 26.0-26.9, adult: Secondary | ICD-10-CM | POA: Diagnosis not present

## 2018-11-30 DIAGNOSIS — G620 Drug-induced polyneuropathy: Secondary | ICD-10-CM | POA: Diagnosis not present

## 2018-11-30 DIAGNOSIS — C9001 Multiple myeloma in remission: Secondary | ICD-10-CM | POA: Diagnosis not present

## 2018-11-30 DIAGNOSIS — D61810 Antineoplastic chemotherapy induced pancytopenia: Secondary | ICD-10-CM | POA: Diagnosis not present

## 2018-11-30 DIAGNOSIS — Z006 Encounter for examination for normal comparison and control in clinical research program: Secondary | ICD-10-CM | POA: Diagnosis not present

## 2018-11-30 DIAGNOSIS — T451X5A Adverse effect of antineoplastic and immunosuppressive drugs, initial encounter: Secondary | ICD-10-CM | POA: Diagnosis not present

## 2018-12-10 DIAGNOSIS — D649 Anemia, unspecified: Secondary | ICD-10-CM | POA: Diagnosis not present

## 2018-12-10 DIAGNOSIS — N400 Enlarged prostate without lower urinary tract symptoms: Secondary | ICD-10-CM | POA: Diagnosis not present

## 2018-12-10 DIAGNOSIS — I10 Essential (primary) hypertension: Secondary | ICD-10-CM | POA: Diagnosis not present

## 2018-12-10 DIAGNOSIS — D61818 Other pancytopenia: Secondary | ICD-10-CM | POA: Diagnosis not present

## 2018-12-10 DIAGNOSIS — C61 Malignant neoplasm of prostate: Secondary | ICD-10-CM | POA: Diagnosis not present

## 2018-12-14 ENCOUNTER — Telehealth: Payer: Self-pay | Admitting: *Deleted

## 2018-12-14 NOTE — Telephone Encounter (Signed)
Needs lab appointment on 12/27/18. Scheduling message sent.

## 2018-12-15 ENCOUNTER — Other Ambulatory Visit: Payer: Self-pay | Admitting: *Deleted

## 2018-12-15 ENCOUNTER — Telehealth: Payer: Self-pay | Admitting: Oncology

## 2018-12-15 DIAGNOSIS — C9000 Multiple myeloma not having achieved remission: Secondary | ICD-10-CM

## 2018-12-15 NOTE — Telephone Encounter (Signed)
Scheduled appt per 11/17 sch message - pt is aware of appt date and time   

## 2018-12-22 ENCOUNTER — Other Ambulatory Visit: Payer: Self-pay | Admitting: *Deleted

## 2018-12-24 MED FILL — XARELTO 20 MG TABLET: 20 | 30 days supply | Qty: 30 | Fill #9

## 2018-12-27 ENCOUNTER — Inpatient Hospital Stay: Payer: Medicare Other | Attending: Oncology

## 2018-12-27 ENCOUNTER — Other Ambulatory Visit: Payer: Self-pay

## 2018-12-27 ENCOUNTER — Encounter: Payer: Self-pay | Admitting: *Deleted

## 2018-12-27 DIAGNOSIS — C9000 Multiple myeloma not having achieved remission: Secondary | ICD-10-CM | POA: Diagnosis not present

## 2018-12-27 LAB — CBC WITH DIFFERENTIAL (CANCER CENTER ONLY)
Abs Immature Granulocytes: 0 10*3/uL (ref 0.00–0.07)
Basophils Absolute: 0.1 10*3/uL (ref 0.0–0.1)
Basophils Relative: 2 %
Eosinophils Absolute: 0.2 10*3/uL (ref 0.0–0.5)
Eosinophils Relative: 7 %
HCT: 45.5 % (ref 39.0–52.0)
Hemoglobin: 13.8 g/dL (ref 13.0–17.0)
Immature Granulocytes: 0 %
Lymphocytes Relative: 32 %
Lymphs Abs: 1.2 10*3/uL (ref 0.7–4.0)
MCH: 26.2 pg (ref 26.0–34.0)
MCHC: 30.3 g/dL (ref 30.0–36.0)
MCV: 86.5 fL (ref 80.0–100.0)
Monocytes Absolute: 0.8 10*3/uL (ref 0.1–1.0)
Monocytes Relative: 23 %
Neutro Abs: 1.4 10*3/uL — ABNORMAL LOW (ref 1.7–7.7)
Neutrophils Relative %: 36 %
Platelet Count: 132 10*3/uL — ABNORMAL LOW (ref 150–400)
RBC: 5.26 MIL/uL (ref 4.22–5.81)
RDW: 17.8 % — ABNORMAL HIGH (ref 11.5–15.5)
WBC Count: 3.6 10*3/uL — ABNORMAL LOW (ref 4.0–10.5)
nRBC: 0 % (ref 0.0–0.2)

## 2018-12-27 LAB — CMP (CANCER CENTER ONLY)
ALT: 9 U/L (ref 0–44)
AST: 11 U/L — ABNORMAL LOW (ref 15–41)
Albumin: 3.2 g/dL — ABNORMAL LOW (ref 3.5–5.0)
Alkaline Phosphatase: 43 U/L (ref 38–126)
Anion gap: 10 (ref 5–15)
BUN: 7 mg/dL — ABNORMAL LOW (ref 8–23)
CO2: 27 mmol/L (ref 22–32)
Calcium: 8.8 mg/dL — ABNORMAL LOW (ref 8.9–10.3)
Chloride: 106 mmol/L (ref 98–111)
Creatinine: 0.79 mg/dL (ref 0.61–1.24)
GFR, Est AFR Am: >60 mL/min (ref >60)
GFR, Estimated: >60 mL/min (ref >60)
Glucose, Bld: 82 mg/dL (ref 70–99)
Potassium: 4.1 mmol/L (ref 3.5–5.1)
Sodium: 143 mmol/L (ref 135–145)
Total Bilirubin: 0.7 mg/dL (ref 0.3–1.2)
Total Protein: 5.7 g/dL — ABNORMAL LOW (ref 6.5–8.1)

## 2018-12-27 LAB — PHOSPHORUS: Phosphorus: 2.9 mg/dL (ref 2.5–4.6)

## 2018-12-27 LAB — MAGNESIUM: Magnesium: 1.6 mg/dL — ABNORMAL LOW (ref 1.7–2.4)

## 2018-12-27 NOTE — Progress Notes (Signed)
Faxed resulted labs today to [facility] att: Isac Caddy at [phone] w/confirmation receipt.

## 2018-12-28 LAB — PROTEIN ELECTROPHORESIS, SERUM
A/G Ratio: 1.4 (ref 0.7–1.7)
Albumin ELP: 3 g/dL (ref 2.9–4.4)
Alpha-1-Globulin: 0.2 g/dL (ref 0.0–0.4)
Alpha-2-Globulin: 0.8 g/dL (ref 0.4–1.0)
Beta Globulin: 0.9 g/dL (ref 0.7–1.3)
Gamma Globulin: 0.3 g/dL — ABNORMAL LOW (ref 0.4–1.8)
Globulin, Total: 2.2 g/dL (ref 2.2–3.9)
Total Protein ELP: 5.2 g/dL — ABNORMAL LOW (ref 6.0–8.5)

## 2018-12-28 LAB — UPEP/UIFE/LIGHT CHAINS/TP, 24-HR UR
% BETA, Urine: 29.5 %
ALPHA 1 URINE: 1 %
Albumin, U: 35.7 %
Alpha 2, Urine: 1.9 %
Free Kappa Lt Chains,Ur: 9.43 mg/L (ref 0.63–113.79)
Free Kappa/Lambda Ratio: 0.42 — ABNORMAL LOW (ref 1.03–31.76)
Free Lambda Lt Chains,Ur: 22.64 mg/L — ABNORMAL HIGH (ref 0.47–11.77)
GAMMA GLOBULIN URINE: 31.9 %
M-SPIKE %, Urine: 8.2 % — ABNORMAL HIGH
M-Spike, Mg/24 Hr: 6 mg/24 hr — ABNORMAL HIGH
Total Protein, Urine-Ur/day: 69 mg/24 hr (ref 30–150)
Total Protein, Urine: 8.6 mg/dL
Total Volume: 800

## 2018-12-28 LAB — KAPPA/LAMBDA LIGHT CHAINS
Kappa free light chain: 8.3 mg/L (ref 3.3–19.4)
Kappa, lambda light chain ratio: 0.09 — ABNORMAL LOW (ref 0.26–1.65)
Lambda free light chains: 89.6 mg/L — ABNORMAL HIGH (ref 5.7–26.3)

## 2018-12-28 LAB — IGG, IGA, IGM
IgA: 145 mg/dL (ref 61–437)
IgG (Immunoglobin G), Serum: 376 mg/dL — ABNORMAL LOW (ref 603–1613)
IgM (Immunoglobulin M), Srm: 21 mg/dL (ref 15–143)

## 2018-12-29 LAB — IMMUNOFIXATION ELECTROPHORESIS
IgA: 144 mg/dL (ref 61–437)
IgG (Immunoglobin G), Serum: 352 mg/dL — ABNORMAL LOW (ref 603–1613)
IgM (Immunoglobulin M), Srm: 21 mg/dL (ref 15–143)
Total Protein ELP: 5.4 g/dL — ABNORMAL LOW (ref 6.0–8.5)

## 2018-12-29 NOTE — Progress Notes (Signed)
Faxed remainder of lab results to Isac Caddy at [place] with confirmation of receipt.

## 2019-01-24 MED FILL — XARELTO 20 MG TABLET: 20 | 30 days supply | Qty: 30 | Fill #10

## 2019-02-05 ENCOUNTER — Ambulatory Visit: Payer: Medicare Other | Attending: Internal Medicine

## 2019-02-05 DIAGNOSIS — Z23 Encounter for immunization: Secondary | ICD-10-CM

## 2019-02-05 NOTE — Progress Notes (Signed)
   Covid-19 Vaccination Clinic  Name:  Mitchell Lowery    MRN: [id number] DOB: 06/08/1934  02/05/2019  [name] was observed post Covid-19 immunization for 15 minutes without incidence. He was provided with Vaccine Information Sheet and instruction to access the V-Safe system.   [name] was instructed to call 911 with any severe reactions post vaccine: Marland Kitchen Difficulty breathing  . Swelling of your face and throat  . A fast heartbeat  . A bad rash all over your body  . Dizziness and weakness    Immunizations Administered    Name Date Dose VIS Date Route   Pfizer COVID-19 Vaccine 02/05/2019 11:03 AM 0.3 mL 01/07/2019 Intramuscular   Manufacturer: [employer], [employer]   Lot: [id number]   [place]: [id number]

## 2019-02-22 ENCOUNTER — Telehealth: Payer: Self-pay | Admitting: Oncology

## 2019-02-22 MED FILL — XARELTO 20 MG TABLET: 20 | 30 days supply | Qty: 30 | Fill #11

## 2019-02-22 NOTE — Telephone Encounter (Signed)
I talk with patient regarding lab 2/1

## 2019-02-23 ENCOUNTER — Other Ambulatory Visit: Payer: Self-pay | Admitting: *Deleted

## 2019-02-23 DIAGNOSIS — C9000 Multiple myeloma not having achieved remission: Secondary | ICD-10-CM

## 2019-02-26 ENCOUNTER — Ambulatory Visit: Payer: Medicare PPO | Attending: Internal Medicine

## 2019-02-26 ENCOUNTER — Ambulatory Visit: Payer: Medicare PPO

## 2019-02-26 DIAGNOSIS — Z23 Encounter for immunization: Secondary | ICD-10-CM

## 2019-02-26 NOTE — Progress Notes (Signed)
   Covid-19 Vaccination Clinic  Name:  Mitchell Lowery    MRN: [id number] DOB: August 30, 1934  02/26/2019  Mitchell Lowery was observed post Covid-19 immunization for 15 minutes without incidence. He was provided with Vaccine Information Sheet and instruction to access the V-Safe system.   Mitchell Lowery was instructed to call 911 with any severe reactions post vaccine: Marland Kitchen Difficulty breathing  . Swelling of your face and throat  . A fast heartbeat  . A bad rash all over your body  . Dizziness and weakness    Immunizations Administered    Name Date Dose VIS Date Route   Pfizer COVID-19 Vaccine 02/26/2019 12:02 PM 0.3 mL 01/07/2019 Intramuscular   Manufacturer: [place]   Lot: [id number]   [place]: [id number]

## 2019-02-28 ENCOUNTER — Inpatient Hospital Stay: Payer: Medicare PPO | Attending: Oncology

## 2019-02-28 ENCOUNTER — Other Ambulatory Visit: Payer: Self-pay

## 2019-02-28 ENCOUNTER — Encounter: Payer: Self-pay | Admitting: *Deleted

## 2019-02-28 DIAGNOSIS — C9000 Multiple myeloma not having achieved remission: Secondary | ICD-10-CM | POA: Insufficient documentation

## 2019-02-28 LAB — CMP (CANCER CENTER ONLY)
ALT: 11 U/L (ref 0–44)
AST: 10 U/L — ABNORMAL LOW (ref 15–41)
Albumin: 3.2 g/dL — ABNORMAL LOW (ref 3.5–5.0)
Alkaline Phosphatase: 43 U/L (ref 38–126)
Anion gap: 9 (ref 5–15)
BUN: 9 mg/dL (ref 8–23)
CO2: 25 mmol/L (ref 22–32)
Calcium: 8.7 mg/dL — ABNORMAL LOW (ref 8.9–10.3)
Chloride: 108 mmol/L (ref 98–111)
Creatinine: 0.7 mg/dL (ref 0.61–1.24)
GFR, Est AFR Am: >60 mL/min (ref >60)
GFR, Estimated: >60 mL/min (ref >60)
Glucose, Bld: 93 mg/dL (ref 70–99)
Potassium: 4.1 mmol/L (ref 3.5–5.1)
Sodium: 142 mmol/L (ref 135–145)
Total Bilirubin: 0.5 mg/dL (ref 0.3–1.2)
Total Protein: 5.7 g/dL — ABNORMAL LOW (ref 6.5–8.1)

## 2019-02-28 LAB — CBC WITH DIFFERENTIAL (CANCER CENTER ONLY)
Abs Immature Granulocytes: 0.01 10*3/uL (ref 0.00–0.07)
Basophils Absolute: 0 10*3/uL (ref 0.0–0.1)
Basophils Relative: 1 %
Eosinophils Absolute: 0.2 10*3/uL (ref 0.0–0.5)
Eosinophils Relative: 5 %
HCT: 41.7 % (ref 39.0–52.0)
Hemoglobin: 13 g/dL (ref 13.0–17.0)
Immature Granulocytes: 0 %
Lymphocytes Relative: 40 %
Lymphs Abs: 1.4 10*3/uL (ref 0.7–4.0)
MCH: 26.8 pg (ref 26.0–34.0)
MCHC: 31.2 g/dL (ref 30.0–36.0)
MCV: 86 fL (ref 80.0–100.0)
Monocytes Absolute: 0.6 10*3/uL (ref 0.1–1.0)
Monocytes Relative: 17 %
Neutro Abs: 1.3 10*3/uL — ABNORMAL LOW (ref 1.7–7.7)
Neutrophils Relative %: 37 %
Platelet Count: 110 10*3/uL — ABNORMAL LOW (ref 150–400)
RBC: 4.85 MIL/uL (ref 4.22–5.81)
RDW: 18.4 % — ABNORMAL HIGH (ref 11.5–15.5)
WBC Count: 3.6 10*3/uL — ABNORMAL LOW (ref 4.0–10.5)
nRBC: 0 % (ref 0.0–0.2)

## 2019-02-28 LAB — PHOSPHORUS: Phosphorus: 3.1 mg/dL (ref 2.5–4.6)

## 2019-02-28 LAB — MAGNESIUM: Magnesium: 1.7 mg/dL (ref 1.7–2.4)

## 2019-02-28 NOTE — Progress Notes (Signed)
Faxed resulted labs from today to [place] with [facility] [phone].

## 2019-03-01 LAB — PROTEIN ELECTROPHORESIS, SERUM
A/G Ratio: 1.2 (ref 0.7–1.7)
Albumin ELP: 2.9 g/dL (ref 2.9–4.4)
Alpha-1-Globulin: 0.2 g/dL (ref 0.0–0.4)
Alpha-2-Globulin: 0.8 g/dL (ref 0.4–1.0)
Beta Globulin: 1 g/dL (ref 0.7–1.3)
Gamma Globulin: 0.4 g/dL (ref 0.4–1.8)
Globulin, Total: 2.4 g/dL (ref 2.2–3.9)
Total Protein ELP: 5.3 g/dL — ABNORMAL LOW (ref 6.0–8.5)

## 2019-03-01 LAB — KAPPA/LAMBDA LIGHT CHAINS
Kappa free light chain: 8.3 mg/L (ref 3.3–19.4)
Kappa, lambda light chain ratio: 0.03 — ABNORMAL LOW (ref 0.26–1.65)
Lambda free light chains: 255.1 mg/L — ABNORMAL HIGH (ref 5.7–26.3)

## 2019-03-02 ENCOUNTER — Encounter: Payer: Self-pay | Admitting: *Deleted

## 2019-03-02 LAB — UPEP/UIFE/LIGHT CHAINS/TP, 24-HR UR
% BETA, Urine: 15.1 %
ALPHA 1 URINE: 1.8 %
Albumin, U: 18.5 %
Alpha 2, Urine: 9.9 %
Free Kappa Lt Chains,Ur: 24.88 mg/L (ref 0.63–113.79)
Free Kappa/Lambda Ratio: 0.07 — ABNORMAL LOW (ref 1.03–31.76)
Free Lambda Lt Chains,Ur: 341.31 mg/L — ABNORMAL HIGH (ref 0.47–11.77)
GAMMA GLOBULIN URINE: 54.7 %
M-SPIKE %, Urine: 43.8 % — ABNORMAL HIGH
M-Spike, Mg/24 Hr: 31 mg/24 hr — ABNORMAL HIGH
Total Protein, Urine-Ur/day: 70 mg/24 hr (ref 30–150)
Total Protein, Urine: 10.8 mg/dL
Total Volume: 650

## 2019-03-02 LAB — IMMUNOFIXATION ELECTROPHORESIS
IgA: 165 mg/dL (ref 61–437)
IgG (Immunoglobin G), Serum: 352 mg/dL — ABNORMAL LOW (ref 603–1613)
IgM (Immunoglobulin M), Srm: 17 mg/dL (ref 15–143)
Total Protein ELP: 5.6 g/dL — ABNORMAL LOW (ref 6.0–8.5)

## 2019-03-02 NOTE — Progress Notes (Signed)
Faxed lab results of 02/28/19 to [facility] att: Isac Caddy at [phone]

## 2019-03-23 ENCOUNTER — Encounter: Payer: Self-pay | Admitting: Cardiovascular Disease

## 2019-03-23 ENCOUNTER — Ambulatory Visit (INDEPENDENT_AMBULATORY_CARE_PROVIDER_SITE_OTHER): Payer: Medicare PPO | Admitting: Cardiovascular Disease

## 2019-03-23 ENCOUNTER — Other Ambulatory Visit: Payer: Self-pay

## 2019-03-23 VITALS — BP 116/64 | HR 81 | Ht 71.0 in | Wt 190.8 lb

## 2019-03-23 DIAGNOSIS — I350 Nonrheumatic aortic (valve) stenosis: Secondary | ICD-10-CM

## 2019-03-23 DIAGNOSIS — I480 Paroxysmal atrial fibrillation: Secondary | ICD-10-CM | POA: Diagnosis not present

## 2019-03-23 DIAGNOSIS — I10 Essential (primary) hypertension: Secondary | ICD-10-CM

## 2019-03-23 NOTE — Progress Notes (Signed)
Cardiology Office Note:    Date:  03/23/2019   ID:  CONWAY FEDORA, DOB 1934-09-11, MRN [id number]  PCP:  [name], MD  Cardiologist:  Sherren Mocha, MD  Electrophysiologist:  None   Referring MD: [name], MD   Chief Complaint  Patient presents with  . Atrial Fibrillation    History of Present Illness:    Mitchell Lowery is a 85 y.o. male with a hx of paroxysmal atrial fibrillation and moderate aortic stenosis, presenting for follow-up evaluation.  His most recent studies have included an outpatient telemetry monitor demonstrated an atrial fibrillation burden of approximately 4%.  The patient brings an home blood pressure and pulse readings today which demonstrate very well controlled blood pressure with an average of 131/65 mmHg.  His average pulse rate is 69 bpm.  He has not had any symptomatic atrial fibrillation.  He continues to tolerate rivaroxaban with no bleeding problems.  He is followed for moderate aortic stenosis with most recent echocardiogram from July 2020 demonstrating a mean transaortic gradient of 35 mmHg.  The patient is here alone today. He is followed for multiple myeloma at [facility] and he reports that the medication he has been taking is no longer effective. He is going to start on a new medication in the near future. He reports intermittent pain in the left thigh region, limiting his walking. He has undergone imaging studies of the left leg with an XRay, but a PET scan is also ordered.  From a cardiac perspective, he reports mild dyspnea with walking up a hill.  He otherwise has no significant limitation at all.  He is able to row intermittently for a length of 7000 m.  He has no symptoms with that level of activity.  He has had no chest pain, chest pressure, orthopnea, PND, or recent heart palpitations.  Past Medical History:  Diagnosis Date  . Anal fistula    treated in [place]  . Anemia    only due to myeloma- "normal now"  . Heart murmur   .  Hemorrhoids   . Multiple myeloma (HCC)    1 month remission now-last tx. 1 month ago- /[name]  . Prostate cancer [facility])    urinary retention, surgery planned-prior radiation, and seed implant about 1 year ago.  . Prostatitis   . Pulmonary embolus, left ([place]) 03/28/2016  . UTI (lower urinary tract infection)    multiple urinary trract infection- being tx presently with antibiotic at present.    Past Surgical History:  Procedure Laterality Date  . ANAL FISTULECTOMY    . ANKLE SURGERY    . CYSTOSCOPY    . CYSTOSCOPY WITH INSERTION OF UROLIFT N/A 03/12/2015   Procedure: CYSTOSCOPY WITH INSERTION OF UROLIFT;  Surgeon: Franchot Gallo, MD;  Location: [facility];  Service: Urology;  Laterality: N/A;  . PROSTATE BIOPSY    . PROSTATE BIOPSY    . RADIOACTIVE SEED IMPLANT N/A 07/21/2014   Procedure: RADIOACTIVE SEED IMPLANT/BRACHYTHERAPY IMPLANT;  Surgeon: Franchot Gallo, MD;  Location: [facility];  Service: Urology;  Laterality: N/A;  . TONSILLECTOMY      Current Medications: Current Meds  Medication Sig  . Cholecalciferol 2000 units TABS Take 1 tablet by mouth daily.  Marland Kitchen dexamethasone (DECADRON) 4 MG tablet Take 20 mg by mouth once a week.  . diphenoxylate-atropine (LOMOTIL) 2.5-0.025 MG tablet Take 1 tablet by mouth daily as needed for diarrhea or loose stools. Usually takes 1 tablet a week  . IXAZOMIB CITRATE PO  Take 4 mg by mouth once a week. 3 weeks on, 1 week off  . losartan (COZAAR) 50 MG tablet Take 50 mg by mouth daily.   . pomalidomide (POMALYST) 4 MG capsule 4 mg. TAKE 4 MG BY MOUTH FOR DAILY FOR 3 WEEKS THEN 1 WEEK OFF  . Psyllium (QC NATURAL VEGETABLE) 95 % POWD Take by mouth.  . sennosides-docusate sodium (SENOKOT-S) 8.6-50 MG tablet Take 1 tablet by mouth as needed for constipation.  Marland Kitchen testosterone cypionate (DEPOTESTOSTERONE CYPIONATE) 200 MG/ML injection Inject 0.5 mLs into the muscle once a week.  . valACYclovir (VALTREX) 500 MG tablet Take 500 mg by  mouth daily.   . vitamin B-12 (CYANOCOBALAMIN) 500 MCG tablet Take 500 mcg by mouth daily.  Alveda Reasons 20 MG TABS tablet TAKE 1 TABLET BY MOUTH DAILY WITH SUPPER.     Allergies:   Patient has no known allergies.   Social History   Socioeconomic History  . Marital status: Married    Spouse name: Not on file  . Number of children: Not on file  . Years of education: Not on file  . Highest education level: Not on file  Occupational History  . Not on file  Tobacco Use  . Smoking status: Never Smoker  . Smokeless tobacco: Never Used  Substance and Sexual Activity  . Alcohol use: Yes    Comment: 1 glass wine daily  . Drug use: No  . Sexual activity: Yes  Other Topics Concern  . Not on file  Social History Narrative  . Not on file   Social Determinants of Health   Financial Resource Strain:   . Difficulty of Paying Living Expenses: Not on file  Food Insecurity:   . Worried About Charity fundraiser in the Last Year: Not on file  . Ran Out of Food in the Last Year: Not on file  Transportation Needs:   . Lack of Transportation (Medical): Not on file  . Lack of Transportation (Non-Medical): Not on file  Physical Activity:   . Days of Exercise per Week: Not on file  . Minutes of Exercise per Session: Not on file  Stress:   . Feeling of Stress : Not on file  Social Connections:   . Frequency of Communication with Friends and Family: Not on file  . Frequency of Social Gatherings with Friends and Family: Not on file  . Attends Religious Services: Not on file  . Active Member of Clubs or Organizations: Not on file  . Attends Archivist Meetings: Not on file  . Marital Status: Not on file     Family History: The patient's family history includes Cancer in his maternal grandfather.  ROS:   Please see the history of present illness.    All other systems reviewed and are negative.  EKGs/Labs/Other Studies Reviewed:    The following studies were reviewed  today:  Echo 08-16-2018: IMPRESSIONS    1. The left ventricle has normal systolic function with an ejection  fraction of 60-65%. The cavity size was normal. There is moderately  increased left ventricular wall thickness. Left ventricular diastolic  Doppler parameters are consistent with impaired  relaxation. Elevated left ventricular end-diastolic pressure The E/e' is  23.  2. The right ventricle has normal systolic function. The cavity was  normal. There is no increase in right ventricular wall thickness.  3. The aortic valve is abnormal. Moderate calcification of the aortic  valve. Moderate-severe stenosis of the aortic valve. Maximum velocity  obtained in the apical view.  4. Mean systolic aortic valve gradient 35 mmHg, velocity 3.8 m/s, peak  gradient 58 mmHg. Using an LVOT diameter of 2cm and LVOT TVI of 25 cm, the  aortic valve area calculates to 0.85 cm2.  5. The aortic root and ascending aorta are normal in size and structure.  6. Mildly dilated pulmonary artery.  7. When compared to the prior study: 06/30/17 - aortic stenosis has  progressed from moderate to moderate-severe. No other significant change.   FINDINGS  Left Ventricle: The left ventricle has normal systolic function, with an  ejection fraction of 60-65%. The cavity size was normal. There is  moderately increased left ventricular wall thickness. Left ventricular  diastolic Doppler parameters are consistent  with impaired relaxation. Elevated left ventricular end-diastolic  pressure The E/e' is 23.   Right Ventricle: The right ventricle has normal systolic function. The  cavity was normal. There is no increase in right ventricular wall  thickness.   Left Atrium: Left atrial size was mildly dilated.   Right Atrium: Right atrial size was normal in size. Right atrial pressure  is estimated at 3 mmHg.   Interatrial Septum: No atrial level shunt detected by color flow Doppler.   Pericardium: There is  no evidence of pericardial effusion.   Mitral Valve: The mitral valve is normal in structure. Mild thickening of  the mitral valve leaflet. Mitral valve regurgitation is trivial by color  flow Doppler.   Tricuspid Valve: The tricuspid valve is normal in structure. Tricuspid  valve regurgitation is trivial by color flow Doppler.   Aortic Valve: The aortic valve is abnormal Moderate calcification of the  aortic valve, with moderately decreased cusp excursion. Aortic valve  regurgitation was not visualized by color flow Doppler. There is  Moderate-severe stenosis of the aortic valve,  with a calculated valve area of 0.82 cm. Maximum velocity obtained in the  apical view   Pulmonic Valve: The pulmonic valve was not well visualized. Pulmonic valve  regurgitation is trivial by color flow Doppler.   Aorta: The aortic root and ascending aorta are normal in size and  structure.   Pulmonary Artery: The pulmonary artery is mildly dilated.   Venous: The inferior vena cava is normal in size with greater than 50%  respiratory variability.   Compared to previous exam: 06/30/17 - aortic stenosis has progressed from  moderate to moderate-severe. No other significant change.   EKG:  EKG is ordered today.  The ekg ordered today demonstrates normal sinus rhythm with PACs, LVH with repolarization abnormality versus inferolateral ischemia.  Recent Labs: 02/28/2019: ALT 11; BUN 9; Creatinine 0.70; Hemoglobin 13.0; Magnesium 1.7; Platelet Count 110; Potassium 4.1; Sodium 142  Recent Lipid Panel No results found for: CHOL, TRIG, HDL, CHOLHDL, VLDL, LDLCALC, LDLDIRECT  Physical Exam:    VS:  BP 116/64   Pulse 81   Ht _0  (1.803 m)   Wt 190 lb 12.8 oz (86.5 kg)   SpO2 97%   BMI 26.61 kg/m     Wt Readings from Last 3 Encounters:  03/23/19 190 lb 12.8 oz (86.5 kg)  11/17/18 191 lb 12.8 oz (87 kg)  08/18/18 193 lb 12.8 oz (87.9 kg)     GEN:  Well nourished, well developed in no acute  distress HEENT: Normal NECK: No JVD; No carotid bruits LYMPHATICS: No lymphadenopathy CARDIAC: RRR, 2/6 harsh mid peaking systolic murmur at the right upper sternal border, A2 is present, no diastolic murmur RESPIRATORY:  Clear  to auscultation without rales, wheezing or rhonchi  ABDOMEN: Soft, non-tender, non-distended MUSCULOSKELETAL: Trace bilateral pretibial edema; No deformity  SKIN: Warm and dry NEUROLOGIC:  Alert and oriented x 3 PSYCHIATRIC:  Normal affect   ASSESSMENT:    1. Paroxysmal atrial fibrillation (HCC)   2. Nonrheumatic aortic valve stenosis   3. Essential hypertension    PLAN:    In order of problems listed above:  1. Appears to be maintaining sinus rhythm.  Tolerating rivaroxaban without bleeding complications.  Continue current therapy. 2. Moderate aortic stenosis by physical exam.  He may be developing early symptoms but he is able to maintain a high functional capacity with no significant cardiopulmonary limitation. I reviewed his last echo study from July 2020 today. I will repeat an echocardiogram this summer at a 1 year interval from his previous study and see him in follow-up at that time.  He is counseled regarding any progressive symptoms and he understands to contact us immediately if any changes occur. 3. HTN: well-controlled on losartan. Renal function normal on recent labs.   Medication Adjustments/Labs and Tests Ordered: Current medicines are reviewed at length with the patient today.  Concerns regarding medicines are outlined above.  No orders of the defined types were placed in this encounter.  No orders of the defined types were placed in this encounter.   Patient Instructions  Medication Instructions:  Your provider recommends that you continue on your current medications as directed. Please refer to the Current Medication list given to you today.     Follow-Up: You will be called to scheduled your July echo and office visit with Dr. Burt Knack  when his schedule is available.     Signed, Sherren Mocha, MD  03/23/2019 1:38 PM    [place]

## 2019-03-23 NOTE — Patient Instructions (Signed)
Medication Instructions:  Your provider recommends that you continue on your current medications as directed. Please refer to the Current Medication list given to you today.     Follow-Up: You will be called to scheduled your July echo and office visit with Dr. Burt Knack when his schedule is available.

## 2019-03-24 ENCOUNTER — Other Ambulatory Visit: Payer: Self-pay | Admitting: Oncology

## 2019-03-24 DIAGNOSIS — I2699 Other pulmonary embolism without acute cor pulmonale: Secondary | ICD-10-CM

## 2019-03-25 ENCOUNTER — Other Ambulatory Visit: Payer: Self-pay | Admitting: Oncology

## 2019-03-25 MED FILL — XARELTO 20 MG TABLET: 20 | 30 days supply | Qty: 30 | Fill #0

## 2019-03-28 ENCOUNTER — Inpatient Hospital Stay: Payer: Medicare PPO | Attending: Oncology | Admitting: Oncology

## 2019-03-28 ENCOUNTER — Telehealth: Payer: Self-pay | Admitting: Oncology

## 2019-03-28 ENCOUNTER — Other Ambulatory Visit: Payer: Self-pay

## 2019-03-28 VITALS — BP 138/48 | HR 85 | Temp 97.8°F | Resp 16 | Ht 71.0 in | Wt 186.5 lb

## 2019-03-28 DIAGNOSIS — Z5111 Encounter for antineoplastic chemotherapy: Secondary | ICD-10-CM | POA: Diagnosis present

## 2019-03-28 DIAGNOSIS — Z006 Encounter for examination for normal comparison and control in clinical research program: Secondary | ICD-10-CM | POA: Diagnosis not present

## 2019-03-28 DIAGNOSIS — Z8546 Personal history of malignant neoplasm of prostate: Secondary | ICD-10-CM | POA: Diagnosis not present

## 2019-03-28 DIAGNOSIS — Z7189 Other specified counseling: Secondary | ICD-10-CM | POA: Diagnosis not present

## 2019-03-28 DIAGNOSIS — C9000 Multiple myeloma not having achieved remission: Secondary | ICD-10-CM | POA: Diagnosis present

## 2019-03-28 DIAGNOSIS — Z79899 Other long term (current) drug therapy: Secondary | ICD-10-CM | POA: Diagnosis not present

## 2019-03-28 DIAGNOSIS — Z923 Personal history of irradiation: Secondary | ICD-10-CM | POA: Diagnosis not present

## 2019-03-28 NOTE — Progress Notes (Signed)
[facility] OFFICE PROGRESS NOTE   Diagnosis: Multiple myeloma  INTERVAL HISTORY:   Mitchell Lowery has been followed at [facility] for the past few years on a clinical trial with ixaxomib, pomalidomide, and Decadron.  The serum free light chains have been rising.  Treatment was discontinued in February 2021.  Dr. Amalia Hailey recommends changing treatment to carfilzomib, daratumumab, and Decadron.  He generally feels well.  He has noted discomfort at the "left hip "for the past month.  The pain is worse with weightbearing.  He has limited his walking activity secondary to pain.  He also has discomfort of the right lateral chest wall and feels there is a "bruise "in this area.  Mitchell Lowery continues Xarelto anticoagulation.  No bleeding.  No difficulty with urination.  He has received both doses of the COVID-19 vaccine.  Objective:  Vital signs in last 24 hours:  Blood pressure (!) 138/48, pulse 85, temperature 97.8 F (36.6 C), temperature source Axillary, resp. rate 16, height '5\' 11"'  (1.803 m), weight 186 lb 8 oz (84.6 kg), SpO2 95 %.     Resp: Lungs clear bilaterally Cardio: Regular rhythm with premature beats, 2/6 systolic murmur GI: Nontender, no hepatosplenomegaly Vascular: No leg edema, support stockings in place Musculoskeletal: There is an area of soft fullness at the right mid lateral chest wall measuring approximately 8-10 cm.  No pain with motion of the left hip.  Mild tenderness at the left trochanter.  No tenderness at the left thigh.   Lab Results:  Lab Results  Component Value Date   WBC 3.6 (L) 02/28/2019   HGB 13.0 02/28/2019   HCT 41.7 02/28/2019   MCV 86.0 02/28/2019   PLT 110 (L) 02/28/2019   NEUTROABS 1.3 (L) 02/28/2019    CMP  Lab Results  Component Value Date   NA 142 02/28/2019   K 4.1 02/28/2019   CL 108 02/28/2019   CO2 25 02/28/2019   GLUCOSE 93 02/28/2019   BUN 9 02/28/2019   CREATININE 0.70 02/28/2019   CALCIUM 8.7 (L) 02/28/2019    PROT 5.7 (L) 02/28/2019   ALBUMIN 3.2 (L) 02/28/2019   AST 10 (L) 02/28/2019   ALT 11 02/28/2019   ALKPHOS 43 02/28/2019   BILITOT 0.5 02/28/2019   GFRNONAA >60 02/28/2019   GFRAA >60 02/28/2019    Medications: I have reviewed the patient's current medications.   Assessment/Plan: 1. Multiple myeloma-confirmed on a bone marrow biopsy 08/07/2014, IgA lambda  Serum M spike and increased serum free lambda light chains  Myeloma FISH panel negative for chromosome 4, 11, 12, 13, 14, and 17 abnormalities, cytogenetics with no metaphases  Bone survey 08/15/2014 with indeterminant lucent skull lesions  Cycle 1 RVD 08/22/2014  Cycle 2 RVD 09/19/2014  Cycle 3 RVD 10/17/2014  Cycle 4 RVD 11/14/2014 (Decadron reduced to 20 mg weekly, Revlimid 15 mg days 1 through 14, Velcade 3/4 weeks)  Cycle 5 RVD 12/12/2014  Serum M spike not detected 12/26/2014  Cycle 6 RVD 01/09/2015  Maintenance Revlimid, 10 mg daily, 02/05/2015 , discontinue 02/26/2015 secondary to leg edema and diarrhea  Revlimid resumed at a dose of 10 mg every other day beginning 03/13/2015  PET scan 42/87/6811-XB hypermetabolic bone lesions, few lucent lesions in the spine and pelvis  Revlimid discontinued January 2019  Enrollment on a clinical trial at [facility] with pomalidomide/Decadron+/- ixazomibbeginning 03/10/2017  Treatment discontinued February 2021 secondary to rising serum lambda light chains  2. 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 History of intermittent prostatitis  4. Skin rash 10/24/2014-referred to dermatology, biopsy consistent with local hypersensitivity reaction  5. 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 Left lower leg  cellulitis 06/24/2015-treated with Keflex  7. History of mild thrombocytopenia secondary tomyeloma and systemic therapy  8. History of mild neutropenia-secondary to myeloma and systemic therapy  9. Fall with a skull fracture and subarachnoid blood/right frontal lobe contusions 10/17/2015  10. Right lower lobe pulmonary embolus 03/28/2016. Lovenox , transitioned to Xarelto  11. Vesicular rashJune 2018-potentially related to the zoster vaccine, resolved  12.  History of atrial fibrillation  13.  Aortic stenosis   Disposition: Mitchell Lowery has multiple myeloma.  He has been maintained on a clinical trial at [facility] since February 2019.  The serum free light chains are rising.  He recently saw Dr. Amalia Hailey.  A change in treatment is recommended.  The plan is to begin systemic therapy with daratumumab, carfilzomib, and Decadron.  We reviewed potential toxicities associated with this regimen including the allergic reaction, respiratory symptoms, and hematologic toxicity seen with daratumumab.  We discussed the fluid retention, respiratory symptoms, neuropathy, cardiac, and hematologic toxicity of carfilzomib.  He agrees to proceed.  He was given reading information on carfilzomib and daratumumab.  We discussed Port-A-Cath placement.  Mitchell Lowery prefers beginning treatment with peripheral IV access.  I am concerned the right chest wall and left leg discomfort are related to bone/soft tissue progression of myeloma.  He will be referred for a restaging PET scan this week.  We will recommend beginning biphosphonate therapy if the PET scan confirms active bone lesions.      [name], MD  03/28/2019  9:57 AM

## 2019-03-28 NOTE — Progress Notes (Signed)
START ON PATHWAY REGIMEN - Multiple Myeloma and Other Plasma Cell Dyscrasias     Cycle 1: A cycle is 28 days:     Dexamethasone      Carfilzomib      Carfilzomib      Daratumumab    Cycle 2: A cycle is 28 days:     Dexamethasone      Carfilzomib      Daratumumab    Cycles 3 through 6: A cycle is every 28 days:     Dexamethasone      Dexamethasone      Carfilzomib      Daratumumab    Cycles 7 and beyond: A cycle is every 28 days:     Dexamethasone      Dexamethasone      Carfilzomib      Daratumumab   **Always confirm dose/schedule in your pharmacy ordering system**  Patient Characteristics: Multiple Myeloma, Relapsed / Refractory, Second through Fourth Lines of Therapy Disease Classification: Multiple Myeloma R-ISS Staging: Unknown Therapeutic Status: Relapsed Line of Therapy: Third Line Intent of Therapy: Non-Curative / Palliative Intent, Discussed with Patient

## 2019-03-28 NOTE — Telephone Encounter (Signed)
Scheduled per 3/1 los. Gave avs and calendar °

## 2019-04-03 ENCOUNTER — Other Ambulatory Visit: Payer: Self-pay | Admitting: Oncology

## 2019-04-03 DIAGNOSIS — C9000 Multiple myeloma not having achieved remission: Secondary | ICD-10-CM

## 2019-04-04 NOTE — Addendum Note (Signed)
Addended by: Rose Phi on: 04/04/2019 04:23 PM   Modules accepted: Orders

## 2019-04-05 ENCOUNTER — Encounter (HOSPITAL_COMMUNITY)
Admission: RE | Admit: 2019-04-05 | Discharge: 2019-04-05 | Disposition: A | Discharge status: Home / Self Care | Payer: Medicare PPO | Source: Ambulatory Visit | Attending: Oncology | Admitting: Oncology

## 2019-04-05 ENCOUNTER — Inpatient Hospital Stay (HOSPITAL_BASED_OUTPATIENT_CLINIC_OR_DEPARTMENT_OTHER): Payer: Medicare PPO | Admitting: Oncology

## 2019-04-05 ENCOUNTER — Inpatient Hospital Stay: Payer: Medicare PPO

## 2019-04-05 ENCOUNTER — Other Ambulatory Visit: Payer: Self-pay

## 2019-04-05 VITALS — BP 128/70 | HR 71 | Temp 97.7°F | Resp 17

## 2019-04-05 VITALS — BP 142/55 | HR 71 | Temp 97.5°F | Resp 18 | Ht 71.0 in | Wt 189.0 lb

## 2019-04-05 DIAGNOSIS — I700 Atherosclerosis of aorta: Secondary | ICD-10-CM | POA: Diagnosis not present

## 2019-04-05 DIAGNOSIS — N320 Bladder-neck obstruction: Secondary | ICD-10-CM | POA: Diagnosis not present

## 2019-04-05 DIAGNOSIS — I2510 Atherosclerotic heart disease of native coronary artery without angina pectoris: Secondary | ICD-10-CM | POA: Insufficient documentation

## 2019-04-05 DIAGNOSIS — Z5111 Encounter for antineoplastic chemotherapy: Secondary | ICD-10-CM | POA: Diagnosis not present

## 2019-04-05 DIAGNOSIS — R0789 Other chest pain: Secondary | ICD-10-CM | POA: Diagnosis not present

## 2019-04-05 DIAGNOSIS — C9000 Multiple myeloma not having achieved remission: Secondary | ICD-10-CM

## 2019-04-05 DIAGNOSIS — J90 Pleural effusion, not elsewhere classified: Secondary | ICD-10-CM | POA: Diagnosis not present

## 2019-04-05 LAB — CMP (CANCER CENTER ONLY)
ALT: 9 U/L (ref 0–44)
AST: 14 U/L — ABNORMAL LOW (ref 15–41)
Albumin: 3.4 g/dL — ABNORMAL LOW (ref 3.5–5.0)
Alkaline Phosphatase: 45 U/L (ref 38–126)
Anion gap: 7 (ref 5–15)
BUN: 11 mg/dL (ref 8–23)
CO2: 27 mmol/L (ref 22–32)
Calcium: 8.9 mg/dL (ref 8.9–10.3)
Chloride: 110 mmol/L (ref 98–111)
Creatinine: 0.74 mg/dL (ref 0.61–1.24)
GFR, Est AFR Am: >60 mL/min (ref >60)
GFR, Estimated: >60 mL/min (ref >60)
Glucose, Bld: 86 mg/dL (ref 70–99)
Potassium: 3.9 mmol/L (ref 3.5–5.1)
Sodium: 144 mmol/L (ref 135–145)
Total Bilirubin: 0.7 mg/dL (ref 0.3–1.2)
Total Protein: 5.9 g/dL — ABNORMAL LOW (ref 6.5–8.1)

## 2019-04-05 LAB — CBC WITH DIFFERENTIAL (CANCER CENTER ONLY)
Abs Immature Granulocytes: 0.01 10*3/uL (ref 0.00–0.07)
Basophils Absolute: 0 10*3/uL (ref 0.0–0.1)
Basophils Relative: 1 %
Eosinophils Absolute: 0.1 10*3/uL (ref 0.0–0.5)
Eosinophils Relative: 2 %
HCT: 44.2 % (ref 39.0–52.0)
Hemoglobin: 13.6 g/dL (ref 13.0–17.0)
Immature Granulocytes: 0 %
Lymphocytes Relative: 31 %
Lymphs Abs: 1.2 10*3/uL (ref 0.7–4.0)
MCH: 27.4 pg (ref 26.0–34.0)
MCHC: 30.8 g/dL (ref 30.0–36.0)
MCV: 89.1 fL (ref 80.0–100.0)
Monocytes Absolute: 0.5 10*3/uL (ref 0.1–1.0)
Monocytes Relative: 13 %
Neutro Abs: 2.2 10*3/uL (ref 1.7–7.7)
Neutrophils Relative %: 53 %
Platelet Count: 117 10*3/uL — ABNORMAL LOW (ref 150–400)
RBC: 4.96 MIL/uL (ref 4.22–5.81)
RDW: 17.8 % — ABNORMAL HIGH (ref 11.5–15.5)
WBC Count: 4 10*3/uL (ref 4.0–10.5)
nRBC: 0 % (ref 0.0–0.2)

## 2019-04-05 LAB — ABO/RH: ABO/RH(D): O POS

## 2019-04-05 LAB — PRETREATMENT RBC PHENOTYPE

## 2019-04-05 LAB — GLUCOSE, CAPILLARY: Glucose-Capillary: 89 mg/dL (ref 70–99)

## 2019-04-05 LAB — TYPE AND SCREEN
ABO/RH(D): O POS
Antibody Screen: NEGATIVE

## 2019-04-05 MED ORDER — DEXTROSE 5 % IV SOLN
19.0000 mg/m2 | Freq: Once | INTRAVENOUS | Status: AC
  Administered 2019-04-05: 40 mg via INTRAVENOUS
  Filled 2019-04-05: qty 15

## 2019-04-05 MED ORDER — ACETAMINOPHEN 325 MG PO TABS
ORAL_TABLET | ORAL | Status: AC
  Filled 2019-04-05: qty 2

## 2019-04-05 MED ORDER — FLUDEOXYGLUCOSE F - 18 (FDG) INJECTION: 9.0000 | Freq: Once | INTRAVENOUS | Status: DC | PRN

## 2019-04-05 MED ORDER — MONTELUKAST SODIUM 10 MG PO TABS
ORAL_TABLET | ORAL | Status: AC
  Filled 2019-04-05: qty 1

## 2019-04-05 MED ORDER — SODIUM CHLORIDE 0.9 % IV SOLN
Freq: Once | INTRAVENOUS | Status: AC
  Filled 2019-04-05: qty 250

## 2019-04-05 MED ORDER — ACETAMINOPHEN 325 MG PO TABS
650.0000 mg | ORAL_TABLET | Freq: Once | ORAL | Status: AC
  Administered 2019-04-05: 650 mg via ORAL

## 2019-04-05 MED ORDER — MONTELUKAST SODIUM 10 MG PO TABS
10.0000 mg | ORAL_TABLET | Freq: Once | ORAL | Status: AC
  Administered 2019-04-05: 10 mg via ORAL

## 2019-04-05 MED ORDER — SODIUM CHLORIDE 0.9 % IV SOLN
Freq: Once | INTRAVENOUS | Status: DC
  Filled 2019-04-05: qty 250

## 2019-04-05 MED ORDER — DIPHENHYDRAMINE HCL 25 MG PO CAPS
ORAL_CAPSULE | ORAL | Status: AC
  Filled 2019-04-05: qty 2

## 2019-04-05 MED ORDER — DARATUMUMAB-HYALURONIDASE-FIHJ 1800-30000 MG-UT/15ML ~~LOC~~ SOLN
1800.0000 mg | Freq: Once | SUBCUTANEOUS | Status: AC
  Administered 2019-04-05: 1800 mg via SUBCUTANEOUS
  Filled 2019-04-05: qty 15

## 2019-04-05 MED ORDER — DIPHENHYDRAMINE HCL 25 MG PO CAPS
50.0000 mg | ORAL_CAPSULE | Freq: Once | ORAL | Status: AC
  Administered 2019-04-05: 10:00:00 50 mg via ORAL

## 2019-04-05 MED ORDER — SODIUM CHLORIDE 0.9 % IV SOLN
20.0000 mg | Freq: Once | INTRAVENOUS | Status: AC
  Administered 2019-04-05: 20 mg via INTRAVENOUS
  Filled 2019-04-05: qty 20

## 2019-04-05 NOTE — Progress Notes (Signed)
Patient discharged after 2 hours of observation post First Faspro - he tolerated well and only had a minimal bruise at site. Explained to patient that was normal and bruising, edema and itching can be normal for up to 24 hours post.

## 2019-04-05 NOTE — Progress Notes (Signed)
[facility] OFFICE PROGRESS NOTE   Diagnosis: Multiple myeloma  INTERVAL HISTORY:   Mitchell Lowery returns for a scheduled visit.  He continues to have discomfort at the right lateral chest and left "hip "areas.  No new complaint.  Objective:  Vital signs in last 24 hours:  Blood pressure (!) 142/55, pulse 71, temperature (!) 97.5 F (36.4 C), temperature source Temporal, resp. rate 18, height '5\' 11"'  (1.803 m), weight 189 lb (85.7 kg), SpO2 97 %.    Musculoskeletal: Soft tissue mass at the right low lateral chest/upper abdomen   Lab Results:  Lab Results  Component Value Date   WBC 4.0 04/05/2019   HGB 13.6 04/05/2019   HCT 44.2 04/05/2019   MCV 89.1 04/05/2019   PLT 117 (L) 04/05/2019   NEUTROABS 2.2 04/05/2019    CMP  Lab Results  Component Value Date   NA 144 04/05/2019   K 3.9 04/05/2019   CL 110 04/05/2019   CO2 27 04/05/2019   GLUCOSE 86 04/05/2019   BUN 11 04/05/2019   CREATININE 0.74 04/05/2019   CALCIUM 8.9 04/05/2019   PROT 5.9 (L) 04/05/2019   ALBUMIN 3.4 (L) 04/05/2019   AST 14 (L) 04/05/2019   ALT 9 04/05/2019   ALKPHOS 45 04/05/2019   BILITOT 0.7 04/05/2019   GFRNONAA >60 04/05/2019   GFRAA >60 04/05/2019     Imaging:  NM PET Image Restage (PS) Whole Body  Addendum Date: 04/05/2019   ADDENDUM REPORT: 04/05/2019 12:11 ADDENDUM: The first impression should state "widespread recurrent myeloma." Electronically Signed   By: Abigail Miyamoto M.D.   On: 04/05/2019 12:11   Result Date: 04/05/2019 CLINICAL DATA:  Subsequent treatment strategy for multiple myeloma of left thigh with. Right chest pain. EXAM: NUCLEAR MEDICINE PET WHOLE BODY TECHNIQUE: 9.0 mCi F-18 FDG was injected intravenously. Full-ring PET imaging was performed from the skull base to thigh after the radiotracer. CT data was obtained and used for attenuation correction and anatomic localization. Fasting blood glucose: 89 mg/dl COMPARISON:  02/01/2016 FINDINGS: Mediastinal blood  pool activity: SUV max 3.0 HEAD/NECK: No areas of abnormal hypermetabolism. Incidental CT findings: No cervical adenopathy. Surgical changes of both globes. Bilateral carotid atherosclerosis. CHEST: Hypermetabolic right low axillary/lateral chest wall nodes. Example 1.1 cm and a S.U.V. max of 2.2 on 107/4. New since the prior. Hypermetabolic right paravertebral mass with cortical irregularity. Example 4.8 x 1.9 cm and a S.U.V. max of 3.8 on 123/4. Right cardiophrenic angle hypermetabolic adenopathy at 1.9 cm and a S.U.V. max of 4.0 on 124/4. Retrocrural node of 1.4 cm and a S.U.V. max of 4.2 on 132/4. Incidental CT findings: Small right pleural effusion. Aortic and coronary artery atherosclerosis. Aortic valve calcification. Pulmonary artery enlargement, outflow tract 3.5 cm. ABDOMEN/PELVIS: A right lateral abdominal wall soft tissue mass measures 4.7 x 5.7 cm and a S.U.V. max of 3.7 on 144/4. This causes mass effect upon the right lobe of the liver. Focus of hypermetabolism about the anterior left pelvic wall measures a S.U.V. max of 3.2, including on 208/4. Incidental CT findings: Radiation seeds in the prostate. Abdominal aortic atherosclerosis. Posterior bladder diverticulum. Bladder wall thickening and mild pericystic edema. SKELETON: L5 hypermetabolic lesion with underlying mild compression deformity. This measures a S.U.V. max of 7.0. Hypermetabolic lytic lesion involving the left transverse process at T3 measures a S.U.V. max of 3.2, including on 74/4. Incidental CT findings: none EXTREMITIES: No areas of abnormal hypermetabolism. Incidental CT findings: none IMPRESSION: 1. Widespread recurrent melanoma, within both  the marrow space and soft tissues of the chest and abdomen as detailed above. 2. Left pelvic skin hypermetabolism is without well-defined CT correlate. Consider physical exam correlation. 3. Incidental findings, including: Small right pleural effusion. Coronary artery atherosclerosis. Aortic  Atherosclerosis (ICD10-I70.0). Pulmonary artery enlargement suggests pulmonary arterial hypertension. Radiation seeds in the prostate. Findings of bladder outlet obstruction. Electronically Signed: By: Abigail Miyamoto M.D. On: 04/05/2019 10:49    Medications: I have reviewed the patient's current medications.   Assessment/Plan: 1. Multiple myeloma-confirmed on a bone marrow biopsy 08/07/2014, IgA lambda  Serum M spike and increased serum free lambda light chains  Myeloma FISH panel negative for chromosome 4, 11, 12, 13, 14, and 17 abnormalities, cytogenetics with no metaphases  Bone survey 08/15/2014 with indeterminant lucent skull lesions  Cycle 1 RVD 08/22/2014  Cycle 2 RVD 09/19/2014  Cycle 3 RVD 10/17/2014  Cycle 4 RVD 11/14/2014 (Decadron reduced to 20 mg weekly, Revlimid 15 mg days 1 through 14, Velcade 3/4 weeks)  Cycle 5 RVD 12/12/2014  Serum M spike not detected 12/26/2014  Cycle 6 RVD 01/09/2015  Maintenance Revlimid, 10 mg daily, 02/05/2015 , discontinue 02/26/2015 secondary to leg edema and diarrhea  Revlimid resumed at a dose of 10 mg every other day beginning 03/13/2015  PET scan 72/53/6644-IH hypermetabolic bone lesions, few lucent lesions in the spine and pelvis  Revlimid discontinued January 2019  Enrollment on a clinical trial at [facility] with pomalidomide/Decadron+/- ixazomibbeginning 03/10/2017  Treatment discontinued February 2021 secondary to rising serum lambda light chains  PET 04/05/2019-hypermetabolic right axillary/chest wall nodes,  right paravertebral mass, small right pleural effusion,  retrocrural node, right lateral abdominal wall mass, L5 lesion mild compression deformity.  Focus of hypermetabolism at the anterior left pelvic wall without a CT correlate.  Hypermetabolic lytic lesion in the left T3 transverse process  Cycle 1 daratumumab/carfilzomib/Decadron 04/05/2019  2. 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 History of intermittent prostatitis  4. Skin rash 10/24/2014-referred to dermatology, biopsy consistent with local hypersensitivity reaction  5. 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 Left lower leg cellulitis 06/24/2015-treated with Keflex  7. History of mild thrombocytopenia secondary tomyeloma and systemic therapy  8. History of mild neutropenia-secondary to myeloma and systemic therapy  9. Fall with a skull fracture and subarachnoid blood/right frontal lobe contusions 10/17/2015  10. Right lower lobe pulmonary embolus 03/28/2016. Lovenox , transitioned to Xarelto  11. Vesicular rashJune 2018-potentially related to the zoster vaccine, resolved  12.  History of atrial fibrillation  13.  Aortic stenosis      Disposition: Mitchell Lowery appears stable.  I reviewed the PET images with him.  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  The "hip "pain is likely related to the lesion involving L5.  We discussed treatment options.  We discussed referral for palliative radiation.  He will begin daratumumab, carfilzomib, and Decadron today.  We reviewed potential toxicities associated with this regimen including the chance of renal and cardiac toxicity with carfilzomib.  We will monitor his clinical status.  The plan is to make a radiation oncology referral if his discomfort does not improve with the current regimen.  He will resume Zometa therapy when he returns for an office visit next week.  [name], MD  04/05/2019  1:47 PM

## 2019-04-05 NOTE — Progress Notes (Signed)
Spoke w/ [name], patient will receive carfilzomib pre-treatment fluids for at least the first cycle. He may remove it on subsequent cycles depending on carfilzomib tolerability.   Zoledronic acid will be resumed next week with treatment to give time for prior authorization investigation to occur. PA team notified.  Demetrius Charity, PharmD, [facility] Phone: [phone] 04/05/2019

## 2019-04-05 NOTE — Patient Instructions (Addendum)
[facility] Discharge Instructions for Patients Receiving Chemotherapy  Today you received the following chemotherapy agents Kyprolis and Daratumumab (Faspro shot)  To help prevent nausea and vomiting after your treatment, we encourage you to take your nausea medication as directed.   If you develop nausea and vomiting that is not controlled by your nausea medication, call the clinic.   BELOW ARE SYMPTOMS THAT SHOULD BE REPORTED IMMEDIATELY:  *FEVER GREATER THAN 100.5 F  *CHILLS WITH OR WITHOUT FEVER  NAUSEA AND VOMITING THAT IS NOT CONTROLLED WITH YOUR NAUSEA MEDICATION  *UNUSUAL SHORTNESS OF BREATH  *UNUSUAL BRUISING OR BLEEDING  TENDERNESS IN MOUTH AND THROAT WITH OR WITHOUT PRESENCE OF ULCERS  *URINARY PROBLEMS  *BOWEL PROBLEMS  UNUSUAL RASH Items with * indicate a potential emergency and should be followed up as soon as possible.  Feel free to call the clinic you have any questions or concerns. The clinic phone number is [phone].  Please show the [facility] at check-in to the Emergency Department and triage nurse.    Carfilzomib injection What is this medicine? CARFILZOMIB (kar FILZ oh mib) targets a specific protein within cancer cells and stops the cancer cells from growing. It is used to treat multiple myeloma. This medicine may be used for other purposes; ask your health care provider or pharmacist if you have questions. COMMON BRAND NAME(S): KYPROLIS What should I tell my health care provider before I take this medicine? They need to know if you have any of these conditions:  heart disease  history of blood clots  irregular heartbeat  kidney disease  liver disease  lung or breathing disease  an unusual or allergic reaction to carfilzomib, or other medicines, foods, dyes, or preservatives  pregnant or trying to get pregnant  breast-feeding How should I use this medicine? This medicine is for injection or infusion into a  vein. It is given by a health care professional in a hospital or clinic setting. Talk to your pediatrician regarding the use of this medicine in children. Special care may be needed. Overdosage: If you think you have taken too much of this medicine contact a poison control center or emergency room at once. NOTE: This medicine is only for you. Do not share this medicine with others. What if I miss a dose? It is important not to miss your dose. Call your doctor or health care professional if you are unable to keep an appointment. What may interact with this medicine? Interactions are not expected. Give your health care provider a list of all the medicines, herbs, non-prescription drugs, or dietary supplements you use. Also tell them if you smoke, drink alcohol, or use illegal drugs. Some items may interact with your medicine. This list may not describe all possible interactions. Give your health care provider a list of all the medicines, herbs, non-prescription drugs, or dietary supplements you use. Also tell them if you smoke, drink alcohol, or use illegal drugs. Some items may interact with your medicine. What should I watch for while using this medicine? Your condition will be monitored carefully while you are receiving this medicine. Report any side effects. Continue your course of treatment even though you feel ill unless your doctor tells you to stop. You may need blood work done while you are taking this medicine. Do not become pregnant while taking this medicine or for at least 6 months after stopping it. Women should inform their doctor if they wish to become pregnant or think they might  be pregnant. There is a potential for serious side effects to an unborn child. Men should not father a child while taking this medicine and for at least 3 months after stopping it. Talk to your health care professional or pharmacist for more information. Do not breast-feed an infant while taking this medicine or  for 2 weeks after the last dose. Check with your doctor or health care professional if you get an attack of severe diarrhea, nausea and vomiting, or if you sweat a lot. The loss of too much body fluid can make it dangerous for you to take this medicine. You may get dizzy. Do not drive, use machinery, or do anything that needs mental alertness until you know how this medicine affects you. Do not stand or sit up quickly, especially if you are an older patient. This reduces the risk of dizzy or fainting spells. What side effects may I notice from receiving this medicine? Side effects that you should report to your doctor or health care professional as soon as possible:  allergic reactions like skin rash, itching or hives, swelling of the face, lips, or tongue  confusion  dizziness  feeling faint or lightheaded  fever or chills  palpitations  seizures  signs and symptoms of bleeding such as bloody or black, tarry stools; red or dark-brown urine; spitting up blood or brown material that looks like coffee grounds; red spots on the skin; unusual bruising or bleeding including from the eye, gums, or nose  signs and symptoms of a blood clot such as breathing problems; changes in vision; chest pain; severe, sudden headache; pain, swelling, warmth in the leg; trouble speaking; sudden numbness or weakness of the face, arm or leg  signs and symptoms of kidney injury like trouble passing urine or change in the amount of urine  signs and symptoms of liver injury like dark yellow or brown urine; general ill feeling or flu-like symptoms; light-colored stools; loss of appetite; nausea; right upper belly pain; unusually weak or tired; yellowing of the eyes or skin Side effects that usually do not require medical attention (report to your doctor or health care professional if they continue or are bothersome):  back pain  cough  diarrhea  headache  muscle cramps  trouble sleeping  vomiting This  list may not describe all possible side effects. Call your doctor for medical advice about side effects. You may report side effects to FDA at 1-800-FDA-1088. Where should I keep my medicine? This drug is given in a hospital or clinic and will not be stored at home. NOTE: This sheet is a summary. It may not cover all possible information. If you have questions about this medicine, talk to your doctor, pharmacist, or health care provider.  2020 Elsevier/Gold Standard (2018-09-20 19:44:21) Daratumumab injection What is this medicine? DARATUMUMAB (dar a toom ue mab) is a monoclonal antibody. It is used to treat multiple myeloma. This medicine may be used for other purposes; ask your health care provider or pharmacist if you have questions. COMMON BRAND NAME(S): DARZALEX What should I tell my health care provider before I take this medicine? They need to know if you have any of these conditions:  infection (especially a virus infection such as chickenpox, herpes, or hepatitis B virus)  lung or breathing disease  an unusual or allergic reaction to daratumumab, other medicines, foods, dyes, or preservatives  pregnant or trying to get pregnant  breast-feeding How should I use this medicine? This medicine is for infusion into a  vein. It is given by a health care professional in a hospital or clinic setting. Talk to your pediatrician regarding the use of this medicine in children. Special care may be needed. Overdosage: If you think you have taken too much of this medicine contact a poison control center or emergency room at once. NOTE: This medicine is only for you. Do not share this medicine with others. What if I miss a dose? Keep appointments for follow-up doses as directed. It is important not to miss your dose. Call your doctor or health care professional if you are unable to keep an appointment. What may interact with this medicine? Interactions have not been studied. This list may not  describe all possible interactions. Give your health care provider a list of all the medicines, herbs, non-prescription drugs, or dietary supplements you use. Also tell them if you smoke, drink alcohol, or use illegal drugs. Some items may interact with your medicine. What should I watch for while using this medicine? This drug may make you feel generally unwell. Report any side effects. Continue your course of treatment even though you feel ill unless your doctor tells you to stop. This medicine can cause serious allergic reactions. To reduce your risk you may need to take medicine before treatment with this medicine. Take your medicine as directed. This medicine can affect the results of blood tests to match your blood type. These changes can last for up to 6 months after the final dose. Your healthcare provider will do blood tests to match your blood type before you start treatment. Tell all of your healthcare providers that you are being treated with this medicine before receiving a blood transfusion. This medicine can affect the results of some tests used to determine treatment response; extra tests may be needed to evaluate response. Do not become pregnant while taking this medicine or for 3 months after stopping it. Women should inform their doctor if they wish to become pregnant or think they might be pregnant. There is a potential for serious side effects to an unborn child. Talk to your health care professional or pharmacist for more information. What side effects may I notice from receiving this medicine? Side effects that you should report to your doctor or health care professional as soon as possible:  allergic reactions like skin rash, itching or hives, swelling of the face, lips, or tongue  breathing problems  chills  cough  dizziness  feeling faint or lightheaded  headache  low blood counts - this medicine may decrease the number of white blood cells, red blood cells and  platelets. You may be at increased risk for infections and bleeding.  nausea, vomiting  shortness of breath  signs of decreased platelets or bleeding - bruising, pinpoint red spots on the skin, black, tarry stools, blood in the urine  signs of decreased red blood cells - unusually weak or tired, feeling faint or lightheaded, falls  signs of infection - fever or chills, cough, sore throat, pain or difficulty passing urine  signs and symptoms of liver injury like dark yellow or brown urine; general ill feeling or flu-like symptoms; light-colored stools; loss of appetite; right upper belly pain; unusually weak or tired; yellowing of the eyes or skin Side effects that usually do not require medical attention (report to your doctor or health care professional if they continue or are bothersome):  back pain  constipation  diarrhea  joint pain  muscle cramps  pain, tingling, numbness in the hands or  feet  swelling of the ankles, feet, hands  tiredness  trouble sleeping This list may not describe all possible side effects. Call your doctor for medical advice about side effects. You may report side effects to FDA at 1-800-FDA-1088. Where should I keep my medicine? This drug is given in a hospital or clinic and will not be stored at home. NOTE: This sheet is a summary. It may not cover all possible information. If you have questions about this medicine, talk to your doctor, pharmacist, or health care provider.  2020 Elsevier/Gold Standard (2018-09-21 18:10:54)

## 2019-04-06 ENCOUNTER — Telehealth: Payer: Self-pay | Admitting: Oncology

## 2019-04-06 ENCOUNTER — Telehealth: Payer: Self-pay | Admitting: *Deleted

## 2019-04-06 LAB — KAPPA/LAMBDA LIGHT CHAINS
Kappa free light chain: 7.6 mg/L (ref 3.3–19.4)
Kappa, lambda light chain ratio: 0.02 — ABNORMAL LOW (ref 0.26–1.65)
Lambda free light chains: 322.4 mg/L — ABNORMAL HIGH (ref 5.7–26.3)

## 2019-04-06 LAB — IGA: IgA: 258 mg/dL (ref 61–437)

## 2019-04-06 NOTE — Telephone Encounter (Signed)
Scheduled per los. Called and left msg informing of added MD appt on 3/16

## 2019-04-10 ENCOUNTER — Other Ambulatory Visit: Payer: Self-pay | Admitting: Oncology

## 2019-04-12 ENCOUNTER — Other Ambulatory Visit: Payer: Self-pay

## 2019-04-12 ENCOUNTER — Inpatient Hospital Stay: Payer: Medicare PPO

## 2019-04-12 ENCOUNTER — Inpatient Hospital Stay (HOSPITAL_BASED_OUTPATIENT_CLINIC_OR_DEPARTMENT_OTHER): Payer: Medicare PPO | Admitting: Oncology

## 2019-04-12 VITALS — BP 122/59 | HR 63

## 2019-04-12 VITALS — BP 128/78 | HR 68 | Temp 97.9°F | Resp 20 | Ht 71.0 in | Wt 186.3 lb

## 2019-04-12 DIAGNOSIS — C9000 Multiple myeloma not having achieved remission: Secondary | ICD-10-CM

## 2019-04-12 DIAGNOSIS — Z5111 Encounter for antineoplastic chemotherapy: Secondary | ICD-10-CM | POA: Diagnosis not present

## 2019-04-12 LAB — CBC WITH DIFFERENTIAL (CANCER CENTER ONLY)
Abs Immature Granulocytes: 0.01 10*3/uL (ref 0.00–0.07)
Basophils Absolute: 0 10*3/uL (ref 0.0–0.1)
Basophils Relative: 0 %
Eosinophils Absolute: 0.1 10*3/uL (ref 0.0–0.5)
Eosinophils Relative: 3 %
HCT: 43.3 % (ref 39.0–52.0)
Hemoglobin: 14 g/dL (ref 13.0–17.0)
Immature Granulocytes: 0 %
Lymphocytes Relative: 11 %
Lymphs Abs: 0.4 10*3/uL — ABNORMAL LOW (ref 0.7–4.0)
MCH: 27.6 pg (ref 26.0–34.0)
MCHC: 32.3 g/dL (ref 30.0–36.0)
MCV: 85.2 fL (ref 80.0–100.0)
Monocytes Absolute: 0.6 10*3/uL (ref 0.1–1.0)
Monocytes Relative: 18 %
Neutro Abs: 2.3 10*3/uL (ref 1.7–7.7)
Neutrophils Relative %: 68 %
Platelet Count: 86 10*3/uL — ABNORMAL LOW (ref 150–400)
RBC: 5.08 MIL/uL (ref 4.22–5.81)
RDW: 17.1 % — ABNORMAL HIGH (ref 11.5–15.5)
WBC Count: 3.4 10*3/uL — ABNORMAL LOW (ref 4.0–10.5)
nRBC: 0 % (ref 0.0–0.2)

## 2019-04-12 LAB — CMP (CANCER CENTER ONLY)
ALT: 10 U/L (ref 0–44)
AST: 13 U/L — ABNORMAL LOW (ref 15–41)
Albumin: 3.3 g/dL — ABNORMAL LOW (ref 3.5–5.0)
Alkaline Phosphatase: 46 U/L (ref 38–126)
Anion gap: 12 (ref 5–15)
BUN: 10 mg/dL (ref 8–23)
CO2: 23 mmol/L (ref 22–32)
Calcium: 9.1 mg/dL (ref 8.9–10.3)
Chloride: 108 mmol/L (ref 98–111)
Creatinine: 0.8 mg/dL (ref 0.61–1.24)
GFR, Est AFR Am: >60 mL/min (ref >60)
GFR, Estimated: >60 mL/min (ref >60)
Glucose, Bld: 128 mg/dL — ABNORMAL HIGH (ref 70–99)
Potassium: 3.9 mmol/L (ref 3.5–5.1)
Sodium: 143 mmol/L (ref 135–145)
Total Bilirubin: 0.8 mg/dL (ref 0.3–1.2)
Total Protein: 5.9 g/dL — ABNORMAL LOW (ref 6.5–8.1)

## 2019-04-12 MED ORDER — ACETAMINOPHEN 325 MG PO TABS
ORAL_TABLET | ORAL | Status: AC
  Filled 2019-04-12: qty 2

## 2019-04-12 MED ORDER — DIPHENHYDRAMINE HCL 25 MG PO CAPS
ORAL_CAPSULE | ORAL | Status: AC
  Filled 2019-04-12: qty 2

## 2019-04-12 MED ORDER — DEXTROSE 5 % IV SOLN
36.0000 mg/m2 | Freq: Once | INTRAVENOUS | Status: AC
  Administered 2019-04-12: 74 mg via INTRAVENOUS
  Filled 2019-04-12: qty 10

## 2019-04-12 MED ORDER — ACETAMINOPHEN 325 MG PO TABS
650.0000 mg | ORAL_TABLET | Freq: Once | ORAL | Status: AC
  Administered 2019-04-12: 650 mg via ORAL

## 2019-04-12 MED ORDER — DARATUMUMAB-HYALURONIDASE-FIHJ 1800-30000 MG-UT/15ML ~~LOC~~ SOLN
1800.0000 mg | Freq: Once | SUBCUTANEOUS | Status: AC
  Administered 2019-04-12: 1800 mg via SUBCUTANEOUS
  Filled 2019-04-12: qty 15

## 2019-04-12 MED ORDER — MONTELUKAST SODIUM 10 MG PO TABS
10.0000 mg | ORAL_TABLET | Freq: Once | ORAL | Status: AC
  Administered 2019-04-12: 10 mg via ORAL

## 2019-04-12 MED ORDER — SODIUM CHLORIDE 0.9 % IV SOLN
20.0000 mg | Freq: Once | INTRAVENOUS | Status: AC
  Administered 2019-04-12: 20 mg via INTRAVENOUS
  Filled 2019-04-12: qty 20

## 2019-04-12 MED ORDER — DIPHENHYDRAMINE HCL 25 MG PO CAPS
50.0000 mg | ORAL_CAPSULE | Freq: Once | ORAL | Status: AC
  Administered 2019-04-12: 50 mg via ORAL

## 2019-04-12 MED ORDER — SODIUM CHLORIDE 0.9 % IV SOLN
Freq: Once | INTRAVENOUS | Status: AC
  Filled 2019-04-12: qty 250

## 2019-04-12 MED ORDER — MONTELUKAST SODIUM 10 MG PO TABS
ORAL_TABLET | ORAL | Status: AC
  Filled 2019-04-12: qty 1

## 2019-04-12 NOTE — Patient Instructions (Signed)
[place] Discharge Instructions for Patients Receiving Chemotherapy  Today you received the following chemotherapy agents Kyprolis and Daratumumab (Faspro shot)  To help prevent nausea and vomiting after your treatment, we encourage you to take your nausea medication as directed.   If you develop nausea and vomiting that is not controlled by your nausea medication, call the clinic.   BELOW ARE SYMPTOMS THAT SHOULD BE REPORTED IMMEDIATELY:  *FEVER GREATER THAN 100.5 F  *CHILLS WITH OR WITHOUT FEVER  NAUSEA AND VOMITING THAT IS NOT CONTROLLED WITH YOUR NAUSEA MEDICATION  *UNUSUAL SHORTNESS OF BREATH  *UNUSUAL BRUISING OR BLEEDING  TENDERNESS IN MOUTH AND THROAT WITH OR WITHOUT PRESENCE OF ULCERS  *URINARY PROBLEMS  *BOWEL PROBLEMS  UNUSUAL RASH Items with * indicate a potential emergency and should be followed up as soon as possible.  Feel free to call the clinic you have any questions or concerns. The clinic phone number is [phone].  Please show the [facility] at check-in to the Emergency Department and triage nurse.    Carfilzomib injection What is this medicine? CARFILZOMIB (kar FILZ oh mib) targets a specific protein within cancer cells and stops the cancer cells from growing. It is used to treat multiple myeloma. This medicine may be used for other purposes; ask your health care provider or pharmacist if you have questions. COMMON BRAND NAME(S): KYPROLIS What should I tell my health care provider before I take this medicine? They need to know if you have any of these conditions:  heart disease  history of blood clots  irregular heartbeat  kidney disease  liver disease  lung or breathing disease  an unusual or allergic reaction to carfilzomib, or other medicines, foods, dyes, or preservatives  pregnant or trying to get pregnant  breast-feeding How should I use this medicine? This medicine is for injection or infusion into a  vein. It is given by a health care professional in a hospital or clinic setting. Talk to your pediatrician regarding the use of this medicine in children. Special care may be needed. Overdosage: If you think you have taken too much of this medicine contact a poison control center or emergency room at once. NOTE: This medicine is only for you. Do not share this medicine with others. What if I miss a dose? It is important not to miss your dose. Call your doctor or health care professional if you are unable to keep an appointment. What may interact with this medicine? Interactions are not expected. Give your health care provider a list of all the medicines, herbs, non-prescription drugs, or dietary supplements you use. Also tell them if you smoke, drink alcohol, or use illegal drugs. Some items may interact with your medicine. This list may not describe all possible interactions. Give your health care provider a list of all the medicines, herbs, non-prescription drugs, or dietary supplements you use. Also tell them if you smoke, drink alcohol, or use illegal drugs. Some items may interact with your medicine. What should I watch for while using this medicine? Your condition will be monitored carefully while you are receiving this medicine. Report any side effects. Continue your course of treatment even though you feel ill unless your doctor tells you to stop. You may need blood work done while you are taking this medicine. Do not become pregnant while taking this medicine or for at least 6 months after stopping it. Women should inform their doctor if they wish to become pregnant or think they might  be pregnant. There is a potential for serious side effects to an unborn child. Men should not father a child while taking this medicine and for at least 3 months after stopping it. Talk to your health care professional or pharmacist for more information. Do not breast-feed an infant while taking this medicine or  for 2 weeks after the last dose. Check with your doctor or health care professional if you get an attack of severe diarrhea, nausea and vomiting, or if you sweat a lot. The loss of too much body fluid can make it dangerous for you to take this medicine. You may get dizzy. Do not drive, use machinery, or do anything that needs mental alertness until you know how this medicine affects you. Do not stand or sit up quickly, especially if you are an older patient. This reduces the risk of dizzy or fainting spells. What side effects may I notice from receiving this medicine? Side effects that you should report to your doctor or health care professional as soon as possible:  allergic reactions like skin rash, itching or hives, swelling of the face, lips, or tongue  confusion  dizziness  feeling faint or lightheaded  fever or chills  palpitations  seizures  signs and symptoms of bleeding such as bloody or black, tarry stools; red or dark-brown urine; spitting up blood or brown material that looks like coffee grounds; red spots on the skin; unusual bruising or bleeding including from the eye, gums, or nose  signs and symptoms of a blood clot such as breathing problems; changes in vision; chest pain; severe, sudden headache; pain, swelling, warmth in the leg; trouble speaking; sudden numbness or weakness of the face, arm or leg  signs and symptoms of kidney injury like trouble passing urine or change in the amount of urine  signs and symptoms of liver injury like dark yellow or brown urine; general ill feeling or flu-like symptoms; light-colored stools; loss of appetite; nausea; right upper belly pain; unusually weak or tired; yellowing of the eyes or skin Side effects that usually do not require medical attention (report to your doctor or health care professional if they continue or are bothersome):  back pain  cough  diarrhea  headache  muscle cramps  trouble sleeping  vomiting This  list may not describe all possible side effects. Call your doctor for medical advice about side effects. You may report side effects to FDA at 1-800-FDA-1088. Where should I keep my medicine? This drug is given in a hospital or clinic and will not be stored at home. NOTE: This sheet is a summary. It may not cover all possible information. If you have questions about this medicine, talk to your doctor, pharmacist, or health care provider.  2020 Elsevier/Gold Standard (2018-09-20 19:44:21) Daratumumab injection What is this medicine? DARATUMUMAB (dar a toom ue mab) is a monoclonal antibody. It is used to treat multiple myeloma. This medicine may be used for other purposes; ask your health care provider or pharmacist if you have questions. COMMON BRAND NAME(S): DARZALEX What should I tell my health care provider before I take this medicine? They need to know if you have any of these conditions:  infection (especially a virus infection such as chickenpox, herpes, or hepatitis B virus)  lung or breathing disease  an unusual or allergic reaction to daratumumab, other medicines, foods, dyes, or preservatives  pregnant or trying to get pregnant  breast-feeding How should I use this medicine? This medicine is for infusion into a  vein. It is given by a health care professional in a hospital or clinic setting. Talk to your pediatrician regarding the use of this medicine in children. Special care may be needed. Overdosage: If you think you have taken too much of this medicine contact a poison control center or emergency room at once. NOTE: This medicine is only for you. Do not share this medicine with others. What if I miss a dose? Keep appointments for follow-up doses as directed. It is important not to miss your dose. Call your doctor or health care professional if you are unable to keep an appointment. What may interact with this medicine? Interactions have not been studied. This list may not  describe all possible interactions. Give your health care provider a list of all the medicines, herbs, non-prescription drugs, or dietary supplements you use. Also tell them if you smoke, drink alcohol, or use illegal drugs. Some items may interact with your medicine. What should I watch for while using this medicine? This drug may make you feel generally unwell. Report any side effects. Continue your course of treatment even though you feel ill unless your doctor tells you to stop. This medicine can cause serious allergic reactions. To reduce your risk you may need to take medicine before treatment with this medicine. Take your medicine as directed. This medicine can affect the results of blood tests to match your blood type. These changes can last for up to 6 months after the final dose. Your healthcare provider will do blood tests to match your blood type before you start treatment. Tell all of your healthcare providers that you are being treated with this medicine before receiving a blood transfusion. This medicine can affect the results of some tests used to determine treatment response; extra tests may be needed to evaluate response. Do not become pregnant while taking this medicine or for 3 months after stopping it. Women should inform their doctor if they wish to become pregnant or think they might be pregnant. There is a potential for serious side effects to an unborn child. Talk to your health care professional or pharmacist for more information. What side effects may I notice from receiving this medicine? Side effects that you should report to your doctor or health care professional as soon as possible:  allergic reactions like skin rash, itching or hives, swelling of the face, lips, or tongue  breathing problems  chills  cough  dizziness  feeling faint or lightheaded  headache  low blood counts - this medicine may decrease the number of white blood cells, red blood cells and  platelets. You may be at increased risk for infections and bleeding.  nausea, vomiting  shortness of breath  signs of decreased platelets or bleeding - bruising, pinpoint red spots on the skin, black, tarry stools, blood in the urine  signs of decreased red blood cells - unusually weak or tired, feeling faint or lightheaded, falls  signs of infection - fever or chills, cough, sore throat, pain or difficulty passing urine  signs and symptoms of liver injury like dark yellow or brown urine; general ill feeling or flu-like symptoms; light-colored stools; loss of appetite; right upper belly pain; unusually weak or tired; yellowing of the eyes or skin Side effects that usually do not require medical attention (report to your doctor or health care professional if they continue or are bothersome):  back pain  constipation  diarrhea  joint pain  muscle cramps  pain, tingling, numbness in the hands or  feet  swelling of the ankles, feet, hands  tiredness  trouble sleeping This list may not describe all possible side effects. Call your doctor for medical advice about side effects. You may report side effects to FDA at 1-800-FDA-1088. Where should I keep my medicine? This drug is given in a hospital or clinic and will not be stored at home. NOTE: This sheet is a summary. It may not cover all possible information. If you have questions about this medicine, talk to your doctor, pharmacist, or health care provider.  2020 Elsevier/Gold Standard (2018-09-21 18:10:54)

## 2019-04-12 NOTE — Progress Notes (Signed)
Per [name]: OK to treat w/platelet count 86,000. Plan to resume Zometa next week as well.

## 2019-04-12 NOTE — Progress Notes (Signed)
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  He completed cycle 1 of daratumumab and carfilzomib on 04/05/2019.  He had mild chills on the afternoon following cycle 1.  No other symptoms of allergic reaction.  No change in the right chest wall mass or left pelvic pain.  No new complaint.  Objective:  Vital signs in last 24 hours:  Blood pressure 128/78, pulse 68, temperature 97.9 F (36.6 C), temperature source Temporal, resp. rate 20, height _0  (1.803 m), weight 186 lb 4.8 oz (84.5 kg), SpO2 99 %.    Musculoskeletal: Soft tissue mass at the right lateral chest wall GI: No hepatosplenomegaly, nontender Vascular: Trace edema at the left greater than right lower leg  Skin: Ecchymosis at the lower abdominal wall   Lab Results:  Lab Results  Component Value Date   WBC 3.4 (L) 04/12/2019   HGB 14.0 04/12/2019   HCT 43.3 04/12/2019   MCV 85.2 04/12/2019   PLT 86 (L) 04/12/2019   NEUTROABS 2.3 04/12/2019    CMP  Lab Results  Component Value Date   NA 143 04/12/2019   K 3.9 04/12/2019   CL 108 04/12/2019   CO2 23 04/12/2019   GLUCOSE 128 (H) 04/12/2019   BUN 10 04/12/2019   CREATININE 0.80 04/12/2019   CALCIUM 9.1 04/12/2019   PROT 5.9 (L) 04/12/2019   ALBUMIN 3.3 (L) 04/12/2019   AST 13 (L) 04/12/2019   ALT 10 04/12/2019   ALKPHOS 46 04/12/2019   BILITOT 0.8 04/12/2019   GFRNONAA >60 04/12/2019   GFRAA >60 04/12/2019     Medications: I have reviewed the patient's current medications.   Assessment/Plan: 1. Multiple myeloma-confirmed on a bone marrow biopsy 08/07/2014, IgA lambda  Serum M spike and increased serum free lambda light chains  Myeloma FISH panel negative for chromosome 4, 11, 12, 13, 14, and 17 abnormalities, cytogenetics with no metaphases  Bone survey 08/15/2014 with indeterminant lucent skull lesions  Cycle 1 RVD 08/22/2014  Cycle 2 RVD  09/19/2014  Cycle 3 RVD 10/17/2014  Cycle 4 RVD 11/14/2014 (Decadron reduced to 20 mg weekly, Revlimid 15 mg days 1 through 14, Velcade 3/4 weeks)  Cycle 5 RVD 12/12/2014  Serum M spike not detected 12/26/2014  Cycle 6 RVD 01/09/2015  Maintenance Revlimid, 10 mg daily, 02/05/2015 , discontinue 02/26/2015 secondary to leg edema and diarrhea  Revlimid resumed at a dose of 10 mg every other day beginning 03/13/2015  PET scan 57/01/7791-JQ hypermetabolic bone lesions, few lucent lesions in the spine and pelvis  Revlimid discontinued January 2019  Enrollment on a clinical trial at [facility] with pomalidomide/Decadron+/- ixazomibbeginning 03/10/2017  Treatment discontinued February 2021 secondary to rising serum lambda light chains  PET 04/05/2019-hypermetabolic right axillary/chest wall nodes,  right paravertebral mass, small right pleural effusion,  retrocrural node, right lateral abdominal wall mass, L5 lesion mild compression deformity.  Focus of hypermetabolism at the anterior left pelvic wall without a CT correlate.  Hypermetabolic lytic lesion in the left T3 transverse process  Cycle 1 daratumumab/carfilzomib/Decadron 04/05/2019  2. 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 History of intermittent prostatitis  4. Skin rash 10/24/2014-referred to dermatology, biopsy consistent with local hypersensitivity reaction  5. 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 Left lower leg cellulitis 06/24/2015-treated with Keflex  7. History of mild thrombocytopenia secondary tomyeloma and systemic therapy  8. History of mild neutropenia-secondary to myeloma and systemic therapy  9. Fall with a skull fracture and  subarachnoid blood/right frontal lobe contusions 10/17/2015  10. Right lower lobe pulmonary embolus 03/28/2016. Lovenox , transitioned to Xarelto  11. Vesicular rashJune 2018-potentially related to the zoster vaccine, resolved  12.  History of atrial fibrillation  13.  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  He will complete day 8 therapy today.  The carfilzomib will be dose escalated today.  The plan is to continue systemic therapy for several weeks prior to deciding on palliative radiation to the right chest wall and spine.  He will be scheduled for an office visit in 2 weeks.  He will contact us in the interim as needed.  [name], MD  04/12/2019  5:39 PM

## 2019-04-12 NOTE — Progress Notes (Signed)
Pt observed for one hour post darzalex faspro, VS WNL, pt in stable condition.

## 2019-04-14 ENCOUNTER — Telehealth: Payer: Self-pay | Admitting: Oncology

## 2019-04-14 NOTE — Telephone Encounter (Signed)
Scheduled per los. Called and left msg. Mailed printout  °

## 2019-04-16 ENCOUNTER — Other Ambulatory Visit: Payer: Self-pay | Admitting: Oncology

## 2019-04-19 ENCOUNTER — Telehealth: Payer: Self-pay

## 2019-04-19 ENCOUNTER — Other Ambulatory Visit: Payer: Self-pay

## 2019-04-19 ENCOUNTER — Inpatient Hospital Stay: Payer: Medicare PPO

## 2019-04-19 VITALS — BP 114/54 | HR 65 | Temp 98.5°F | Resp 18 | Wt 187.0 lb

## 2019-04-19 DIAGNOSIS — C9000 Multiple myeloma not having achieved remission: Secondary | ICD-10-CM

## 2019-04-19 DIAGNOSIS — Z5111 Encounter for antineoplastic chemotherapy: Secondary | ICD-10-CM | POA: Diagnosis not present

## 2019-04-19 LAB — CMP (CANCER CENTER ONLY)
ALT: 11 U/L (ref 0–44)
AST: 13 U/L — ABNORMAL LOW (ref 15–41)
Albumin: 3.2 g/dL — ABNORMAL LOW (ref 3.5–5.0)
Alkaline Phosphatase: 47 U/L (ref 38–126)
Anion gap: 9 (ref 5–15)
BUN: 9 mg/dL (ref 8–23)
CO2: 22 mmol/L (ref 22–32)
Calcium: 8.8 mg/dL — ABNORMAL LOW (ref 8.9–10.3)
Chloride: 109 mmol/L (ref 98–111)
Creatinine: 0.79 mg/dL (ref 0.61–1.24)
GFR, Est AFR Am: >60 mL/min (ref >60)
GFR, Estimated: >60 mL/min (ref >60)
Glucose, Bld: 116 mg/dL — ABNORMAL HIGH (ref 70–99)
Potassium: 3.9 mmol/L (ref 3.5–5.1)
Sodium: 140 mmol/L (ref 135–145)
Total Bilirubin: 0.7 mg/dL (ref 0.3–1.2)
Total Protein: 5.7 g/dL — ABNORMAL LOW (ref 6.5–8.1)

## 2019-04-19 LAB — CBC WITH DIFFERENTIAL (CANCER CENTER ONLY)
Abs Immature Granulocytes: 0 10*3/uL (ref 0.00–0.07)
Basophils Absolute: 0 10*3/uL (ref 0.0–0.1)
Basophils Relative: 0 %
Eosinophils Absolute: 0.1 10*3/uL (ref 0.0–0.5)
Eosinophils Relative: 2 %
HCT: 43.3 % (ref 39.0–52.0)
Hemoglobin: 14 g/dL (ref 13.0–17.0)
Immature Granulocytes: 0 %
Lymphocytes Relative: 11 %
Lymphs Abs: 0.3 10*3/uL — ABNORMAL LOW (ref 0.7–4.0)
MCH: 27.7 pg (ref 26.0–34.0)
MCHC: 32.3 g/dL (ref 30.0–36.0)
MCV: 85.6 fL (ref 80.0–100.0)
Monocytes Absolute: 0.7 10*3/uL (ref 0.1–1.0)
Monocytes Relative: 24 %
Neutro Abs: 1.8 10*3/uL (ref 1.7–7.7)
Neutrophils Relative %: 63 %
Platelet Count: 89 10*3/uL — ABNORMAL LOW (ref 150–400)
RBC: 5.06 MIL/uL (ref 4.22–5.81)
RDW: 17.1 % — ABNORMAL HIGH (ref 11.5–15.5)
WBC Count: 2.8 10*3/uL — ABNORMAL LOW (ref 4.0–10.5)
nRBC: 0 % (ref 0.0–0.2)

## 2019-04-19 MED ORDER — ACETAMINOPHEN 325 MG PO TABS
ORAL_TABLET | ORAL | Status: AC
  Filled 2019-04-19: qty 2

## 2019-04-19 MED ORDER — MONTELUKAST SODIUM 10 MG PO TABS
ORAL_TABLET | ORAL | Status: AC
  Filled 2019-04-19: qty 1

## 2019-04-19 MED ORDER — ZOLEDRONIC ACID 4 MG/100ML IV SOLN
4.0000 mg | Freq: Once | INTRAVENOUS | Status: AC
  Administered 2019-04-19: 10:00:00 4 mg via INTRAVENOUS

## 2019-04-19 MED ORDER — SODIUM CHLORIDE 0.9 % IV SOLN
20.0000 mg | Freq: Once | INTRAVENOUS | Status: AC
  Administered 2019-04-19: 20 mg via INTRAVENOUS
  Filled 2019-04-19: qty 20

## 2019-04-19 MED ORDER — DARATUMUMAB-HYALURONIDASE-FIHJ 1800-30000 MG-UT/15ML ~~LOC~~ SOLN
1800.0000 mg | Freq: Once | SUBCUTANEOUS | Status: AC
  Administered 2019-04-19: 1800 mg via SUBCUTANEOUS
  Filled 2019-04-19: qty 15

## 2019-04-19 MED ORDER — SODIUM CHLORIDE 0.9 % IV SOLN
Freq: Once | INTRAVENOUS | Status: AC
  Filled 2019-04-19: qty 250

## 2019-04-19 MED ORDER — DEXTROSE 5 % IV SOLN
120.0000 mg | Freq: Once | INTRAVENOUS | Status: AC
  Administered 2019-04-19: 120 mg via INTRAVENOUS
  Filled 2019-04-19: qty 60

## 2019-04-19 MED ORDER — MONTELUKAST SODIUM 10 MG PO TABS
10.0000 mg | ORAL_TABLET | Freq: Once | ORAL | Status: AC
  Administered 2019-04-19: 09:00:00 10 mg via ORAL

## 2019-04-19 MED ORDER — DIPHENHYDRAMINE HCL 25 MG PO CAPS
50.0000 mg | ORAL_CAPSULE | Freq: Once | ORAL | Status: AC
  Administered 2019-04-19: 50 mg via ORAL

## 2019-04-19 MED ORDER — SODIUM CHLORIDE 0.9 % IV SOLN
Freq: Once | INTRAVENOUS | Status: DC
  Filled 2019-04-19: qty 250

## 2019-04-19 MED ORDER — ACETAMINOPHEN 325 MG PO TABS
650.0000 mg | ORAL_TABLET | Freq: Once | ORAL | Status: AC
  Administered 2019-04-19: 650 mg via ORAL

## 2019-04-19 MED ORDER — ZOLEDRONIC ACID 4 MG/100ML IV SOLN
INTRAVENOUS | Status: AC
  Filled 2019-04-19: qty 100

## 2019-04-19 MED ORDER — DIPHENHYDRAMINE HCL 25 MG PO CAPS
ORAL_CAPSULE | ORAL | Status: AC
  Filled 2019-04-19: qty 2

## 2019-04-19 NOTE — Telephone Encounter (Signed)
Call placed per MD Sherrill regarding pt's pain. Pt reports that his pain is located in his back, ribs and legs and describes it as a 6 on a pain scale on 0-10. He reports that he has taken Tylenol during his infusion at [facility] this AM but that it has not seemed to help. This RN informed pt that she would notify MD and call him back. Pt verbalizes understanding and agrees with plan of care.

## 2019-04-19 NOTE — Patient Instructions (Signed)
[facility] Discharge Instructions for Patients Receiving Chemotherapy  Today you received the following chemotherapy agents: Kyprolis, Darzalex Faspro  To help prevent nausea and vomiting after your treatment, we encourage you to take your nausea medication as directed.    If you develop nausea and vomiting that is not controlled by your nausea medication, call the clinic.   BELOW ARE SYMPTOMS THAT SHOULD BE REPORTED IMMEDIATELY:  *FEVER GREATER THAN 100.5 F  *CHILLS WITH OR WITHOUT FEVER  NAUSEA AND VOMITING THAT IS NOT CONTROLLED WITH YOUR NAUSEA MEDICATION  *UNUSUAL SHORTNESS OF BREATH  *UNUSUAL BRUISING OR BLEEDING  TENDERNESS IN MOUTH AND THROAT WITH OR WITHOUT PRESENCE OF ULCERS  *URINARY PROBLEMS  *BOWEL PROBLEMS  UNUSUAL RASH Items with * indicate a potential emergency and should be followed up as soon as possible.  Feel free to call the clinic should you have any questions or concerns. The clinic phone number is [phone].  Please show the CHEMO ALERT CARD at check-in to the Emergency Department and triage nurse.   

## 2019-04-19 NOTE — Progress Notes (Signed)
Ok to treat with CBC today per MD Sherrill 

## 2019-04-20 ENCOUNTER — Ambulatory Visit
Admission: RE | Admit: 2019-04-20 | Discharge: 2019-04-20 | Disposition: A | Discharge status: Home / Self Care | Payer: Medicare PPO | Source: Ambulatory Visit | Attending: Radiation Oncology | Admitting: Radiation Oncology

## 2019-04-20 ENCOUNTER — Encounter: Payer: Self-pay | Admitting: Radiation Oncology

## 2019-04-20 ENCOUNTER — Other Ambulatory Visit: Payer: Self-pay

## 2019-04-20 VITALS — BP 144/73 | HR 64 | Temp 98.2°F | Resp 18 | Ht 71.0 in | Wt 186.0 lb

## 2019-04-20 DIAGNOSIS — I700 Atherosclerosis of aorta: Secondary | ICD-10-CM | POA: Diagnosis not present

## 2019-04-20 DIAGNOSIS — N320 Bladder-neck obstruction: Secondary | ICD-10-CM | POA: Insufficient documentation

## 2019-04-20 DIAGNOSIS — C61 Malignant neoplasm of prostate: Secondary | ICD-10-CM | POA: Insufficient documentation

## 2019-04-20 DIAGNOSIS — Z923 Personal history of irradiation: Secondary | ICD-10-CM | POA: Diagnosis not present

## 2019-04-20 DIAGNOSIS — C9000 Multiple myeloma not having achieved remission: Secondary | ICD-10-CM | POA: Insufficient documentation

## 2019-04-20 DIAGNOSIS — C7951 Secondary malignant neoplasm of bone: Secondary | ICD-10-CM

## 2019-04-20 DIAGNOSIS — Z7901 Long term (current) use of anticoagulants: Secondary | ICD-10-CM | POA: Diagnosis not present

## 2019-04-20 DIAGNOSIS — R011 Cardiac murmur, unspecified: Secondary | ICD-10-CM | POA: Diagnosis not present

## 2019-04-20 DIAGNOSIS — M545 Low back pain: Secondary | ICD-10-CM | POA: Insufficient documentation

## 2019-04-20 DIAGNOSIS — I6523 Occlusion and stenosis of bilateral carotid arteries: Secondary | ICD-10-CM | POA: Insufficient documentation

## 2019-04-20 DIAGNOSIS — C9002 Multiple myeloma in relapse: Secondary | ICD-10-CM

## 2019-04-20 DIAGNOSIS — Z800 Family history of malignant neoplasm of digestive organs: Secondary | ICD-10-CM | POA: Diagnosis not present

## 2019-04-20 DIAGNOSIS — Z79899 Other long term (current) drug therapy: Secondary | ICD-10-CM | POA: Diagnosis not present

## 2019-04-20 DIAGNOSIS — Z510 Encounter for antineoplastic radiation therapy: Secondary | ICD-10-CM | POA: Insufficient documentation

## 2019-04-20 DIAGNOSIS — C7952 Secondary malignant neoplasm of bone marrow: Secondary | ICD-10-CM

## 2019-04-20 DIAGNOSIS — Z86711 Personal history of pulmonary embolism: Secondary | ICD-10-CM | POA: Diagnosis not present

## 2019-04-20 NOTE — Progress Notes (Signed)
Radiation Oncology         [phone] ________________________________  Initial outpatient Consultation  Name: Mitchell Lowery MRN: [id number]  Date of Service: 04/20/2019 DOB: 11/07/1934  SH:FWYOV, [name], MD  Ladell Pier, MD   REFERRING PHYSICIAN: Ladell Pier, MD  DIAGNOSIS: The encounter diagnosis was Multiple myeloma in relapse [facility]).    ICD-10-CM   1. Multiple myeloma in relapse (HCC)  C90.02     HISTORY OF PRESENT ILLNESS: Mitchell Lowery is a 85 y.o. male seen at the request of [name].  He is well known to our service having been treated with seed boost plus 5 weeks of external beam radiation previously for his Gleason 4+5 prostate cancer completed in 06/2014.  He was noted to be pancytopenic on preop labs for brachytherapy procedure and underwent BMB on 08/07/2014 which showed plasma cells that were lambda light chain restricted and serum M spike with increased serum free lambda light chains, consistent with multiple myeloma. This has been managed under the care and direction of [name] since the time of diagnosis.  He completed 6 cycles of RVD sstemic therapy followed by maintenance Revlimid but this was discontinued in 01/2017 when he enrolled in a clinical trial at [facility] with pomalidomide/Decadron/ixazomibbeginning 03/10/2017, under the care and direction of Dr. Effie Berkshire.  He was noted to have rising serum lambda light chains in 02/2019 so this was discontinued.  PET scan for re-staging showed hypermetabolic right axillary/chest wall nodes,  right paravertebral mass, small right pleural effusion, a retrocrural node, right lateral abdominal wall mass, L5 lesion with mild compression deformity, a focus of hypermetabolism at the anterior left pelvic wall without a CT correlate and a hypermetabolic lytic lesion in the left T3 transverse process. His systemic therapy was changed to daratumumab/carfilzomib/Decadron, first dose on 04/05/2019 which he has tolerated  well.  We have been asked to consult today to discuss palliative radiotherapy to the right lateral chest wall mass and L5 due to progressive low back pain with radiation into the left anterior thigh and right rib pain which is not responding to pain medications at this point and interfering with his QOL  Over the past month, he has only been able to walk approximately 1/2 mile/day, limited by pain and imbalance, as compared to 3 miles/day prior to that.  PREVIOUS RADIATION THERAPY: Yes  05/17/2014-06/21/2014:  The prostate, seminal vesicles and pelvic lymph nodes were treated to 45 Gy in 25 fractions of 1.8 Gy  07/21/2014:  Insertion of radioactive I-125 seeds into the prostate gland; 110 Gy, boost therapy.(Shardee Dieu/Dahlstedt)  PAST MEDICAL HISTORY:  Past Medical History:  Diagnosis Date  . Anal fistula    treated in [place]  . Anemia    only due to myeloma- "normal now"  . Heart murmur   . Hemorrhoids   . Multiple myeloma (HCC)    1 month remission now-last tx. 1 month ago- /[name]  . Prostate cancer [facility])    urinary retention, surgery planned-prior radiation, and seed implant about 1 year ago.  . Prostatitis   . Pulmonary embolus, left ([place]) 03/28/2016  . UTI (lower urinary tract infection)    multiple urinary trract infection- being tx presently with antibiotic at present.      PAST SURGICAL HISTORY: Past Surgical History:  Procedure Laterality Date  . ANAL FISTULECTOMY    . ANKLE SURGERY    . CYSTOSCOPY    . CYSTOSCOPY WITH INSERTION OF UROLIFT N/A 03/12/2015   Procedure: CYSTOSCOPY  WITH INSERTION OF UROLIFT;  Surgeon: Franchot Gallo, MD;  Location: [facility];  Service: Urology;  Laterality: N/A;  . PROSTATE BIOPSY    . PROSTATE BIOPSY    . RADIOACTIVE SEED IMPLANT N/A 07/21/2014   Procedure: RADIOACTIVE SEED IMPLANT/BRACHYTHERAPY IMPLANT;  Surgeon: Franchot Gallo, MD;  Location: [facility];  Service: Urology;  Laterality: N/A;  . TONSILLECTOMY       FAMILY HISTORY:  Family History  Problem Relation Age of Onset  . Cancer Maternal Grandfather        mouth/throat ca    SOCIAL HISTORY:  Social History   Socioeconomic History  . Marital status: Married    Spouse name: Not on file  . Number of children: Not on file  . Years of education: Not on file  . Highest education level: Not on file  Occupational History  . Not on file  Tobacco Use  . Smoking status: Never Smoker  . Smokeless tobacco: Never Used  Substance and Sexual Activity  . Alcohol use: Yes    Comment: 1 glass wine daily  . Drug use: No  . Sexual activity: Yes  Other Topics Concern  . Not on file  Social History Narrative  . Not on file   Social Determinants of Health   Financial Resource Strain:   . Difficulty of Paying Living Expenses:   Food Insecurity:   . Worried About Charity fundraiser in the Last Year:   . Arboriculturist in the Last Year:   Transportation Needs:   . Film/video editor (Medical):   Marland Kitchen Lack of Transportation (Non-Medical):   Physical Activity:   . Days of Exercise per Week:   . Minutes of Exercise per Session:   Stress:   . Feeling of Stress :   Social Connections:   . Frequency of Communication with Friends and Family:   . Frequency of Social Gatherings with Friends and Family:   . Attends Religious Services:   . Active Member of Clubs or Organizations:   . Attends Archivist Meetings:   Marland Kitchen Marital Status:   Intimate Partner Violence:   . Fear of Current or Ex-Partner:   . Emotionally Abused:   Marland Kitchen Physically Abused:   . Sexually Abused:     ALLERGIES: Patient has no known allergies.  MEDICATIONS:  Current Outpatient Medications  Medication Sig Dispense Refill  . Cholecalciferol 2000 units TABS Take 1 tablet by mouth daily.    . diphenoxylate-atropine (LOMOTIL) 2.5-0.025 MG tablet Take 1 tablet by mouth daily as needed for diarrhea or loose stools. Usually takes 1 tablet a week    . losartan  (COZAAR) 50 MG tablet Take 50 mg by mouth daily.     . Psyllium (QC NATURAL VEGETABLE) 95 % POWD Take by mouth.    . sennosides-docusate sodium (SENOKOT-S) 8.6-50 MG tablet Take 1 tablet by mouth as needed for constipation.    . valACYclovir (VALTREX) 500 MG tablet Take 500 mg by mouth daily.     . vitamin B-12 (CYANOCOBALAMIN) 500 MCG tablet Take 500 mcg by mouth daily.    Alveda Reasons 20 MG TABS tablet TAKE 1 TABLET BY MOUTH DAILY WITH SUPPER. 30 tablet 11   No current facility-administered medications for this encounter.    REVIEW OF SYSTEMS:  On review of systems, the patient reports that he is doing well overall. He denies any chest pain, shortness of breath, cough, fevers, chills, night sweats, unintended weight changes. He denies  any bowel or bladder disturbances, and denies abdominal pain, nausea or vomiting. He denies any new musculoskeletal or joint aches or pains but has had persistent, progressive pain in the low back into his legs as well as in the right lateral ribs. No other site specific pain. A complete review of systems is obtained and is otherwise negative.  PHYSICAL EXAM:  Wt Readings from Last 3 Encounters:  04/20/19 186 lb (84.4 kg)  04/19/19 187 lb (84.8 kg)  04/12/19 186 lb 4.8 oz (84.5 kg)   Temp Readings from Last 3 Encounters:  04/20/19 98.2 F (36.8 C)  04/19/19 98.5 F (36.9 C) (Tympanic)  04/12/19 97.9 F (36.6 C) (Temporal)   BP Readings from Last 3 Encounters:  04/20/19 (!) 144/73  04/19/19 (!) 114/54  04/12/19 (!) 122/59   Pulse Readings from Last 3 Encounters:  04/20/19 64  04/19/19 65  04/12/19 63   Pain Assessment Pain Score: 5  Pain Frequency: Intermittent Pain Loc: Back/10  In general this is a well appearing Caucasian male in no acute distress. He is alert and oriented x4 and appropriate throughout the examination. Cardiopulmonary assessment is negative for acute distress and he exhibits normal effort.   KPS = 90  100 - Normal; no  complaints; no evidence of disease. 90   - Able to carry on normal activity; minor signs or symptoms of disease. 80   - Normal activity with effort; some signs or symptoms of disease. 37   - Cares for self; unable to carry on normal activity or to do active work. 60   - Requires occasional assistance, but is able to care for most of his personal needs. 50   - Requires considerable assistance and frequent medical care. 54   - Disabled; requires special care and assistance. 37   - Severely disabled; hospital admission is indicated although death not imminent. 42   - Very sick; hospital admission necessary; active supportive treatment necessary. 10   - Moribund; fatal processes progressing rapidly. 0     - Dead  Karnofsky DA, Abelmann Shaniko, Craver LS and Burchenal [facility] [phone]) The use of the nitrogen mustards in the palliative treatment of carcinoma: with particular reference to bronchogenic carcinoma Cancer 1 634-56  LABORATORY DATA:  Lab Results  Component Value Date   WBC 2.8 (L) 04/19/2019   HGB 14.0 04/19/2019   HCT 43.3 04/19/2019   MCV 85.6 04/19/2019   PLT 89 (L) 04/19/2019   Lab Results  Component Value Date   NA 140 04/19/2019   K 3.9 04/19/2019   CL 109 04/19/2019   CO2 22 04/19/2019   Lab Results  Component Value Date   ALT 11 04/19/2019   AST 13 (L) 04/19/2019   ALKPHOS 47 04/19/2019   BILITOT 0.7 04/19/2019     RADIOGRAPHY: NM PET Image Restage (PS) Whole Body  Addendum Date: 04/05/2019   ADDENDUM REPORT: 04/05/2019 12:11 ADDENDUM: The first impression should state "widespread recurrent myeloma." Electronically Signed   By: Abigail Miyamoto M.D.   On: 04/05/2019 12:11   Result Date: 04/05/2019 CLINICAL DATA:  Subsequent treatment strategy for multiple myeloma of left thigh with. Right chest pain. EXAM: NUCLEAR MEDICINE PET WHOLE BODY TECHNIQUE: 9.0 mCi F-18 FDG was injected intravenously. Full-ring PET imaging was performed from the skull base to thigh after the radiotracer.  CT data was obtained and used for attenuation correction and anatomic localization. Fasting blood glucose: 89 mg/dl COMPARISON:  02/01/2016 FINDINGS: Mediastinal blood pool activity: SUV max 3.0  HEAD/NECK: No areas of abnormal hypermetabolism. Incidental CT findings: No cervical adenopathy. Surgical changes of both globes. Bilateral carotid atherosclerosis. CHEST: Hypermetabolic right low axillary/lateral chest wall nodes. Example 1.1 cm and a S.U.V. max of 2.2 on 107/4. New since the prior. Hypermetabolic right paravertebral mass with cortical irregularity. Example 4.8 x 1.9 cm and a S.U.V. max of 3.8 on 123/4. Right cardiophrenic angle hypermetabolic adenopathy at 1.9 cm and a S.U.V. max of 4.0 on 124/4. Retrocrural node of 1.4 cm and a S.U.V. max of 4.2 on 132/4. Incidental CT findings: Small right pleural effusion. Aortic and coronary artery atherosclerosis. Aortic valve calcification. Pulmonary artery enlargement, outflow tract 3.5 cm. ABDOMEN/PELVIS: A right lateral abdominal wall soft tissue mass measures 4.7 x 5.7 cm and a S.U.V. max of 3.7 on 144/4. This causes mass effect upon the right lobe of the liver. Focus of hypermetabolism about the anterior left pelvic wall measures a S.U.V. max of 3.2, including on 208/4. Incidental CT findings: Radiation seeds in the prostate. Abdominal aortic atherosclerosis. Posterior bladder diverticulum. Bladder wall thickening and mild pericystic edema. SKELETON: L5 hypermetabolic lesion with underlying mild compression deformity. This measures a S.U.V. max of 7.0. Hypermetabolic lytic lesion involving the left transverse process at T3 measures a S.U.V. max of 3.2, including on 74/4. Incidental CT findings: none EXTREMITIES: No areas of abnormal hypermetabolism. Incidental CT findings: none IMPRESSION: 1. Widespread recurrent melanoma, within both the marrow space and soft tissues of the chest and abdomen as detailed above. 2. Left pelvic skin hypermetabolism is without  well-defined CT correlate. Consider physical exam correlation. 3. Incidental findings, including: Small right pleural effusion. Coronary artery atherosclerosis. Aortic Atherosclerosis (ICD10-I70.0). Pulmonary artery enlargement suggests pulmonary arterial hypertension. Radiation seeds in the prostate. Findings of bladder outlet obstruction. Electronically Signed: By: Abigail Miyamoto M.D. On: 04/05/2019 10:49      IMPRESSION/PLAN: 1. 85 y.o. male with painful disease in L5 and right lateral ribs secondary to relapse of multiple myeloma. Today, we talked to the patient and family about the findings and workup thus far. We discussed the natural history of multiple myeloma and general treatment, highlighting the role of radiotherapy in the management of symptomatic bony lesions. We discussed the available radiation techniques, and focused on the details of logistics and delivery. Dr. Tammi Klippel recommends a 2 week course of palliative radiotherapy to painful L5 and the right lateral chest wall/rib lesions. We reviewed the anticipated acute and late sequelae associated with radiation in this setting. The patient was encouraged to ask questions that were answered to his satisfaction.  At the conclusion of our conversation, the patient elects to proceed with palliative radiotherapy to the painful L5 and the right lateral chest wall/rib lesions as recommended. He has freely signed written consent to proceed today in the office and a copy of this document will be placed in his chart.  He will proceed with CT SIM/treatment planning today in anticipation of beginning his daily radiation later this week.  We enjoyed meeting with him today and look forward to participating in his care.  A copy of today's discussion will be shared with [name] and we will move forward with treatment planning accordingly.   In a visit lasting 30 minutes, greater than 50% of that time was spent in floor time, discussing his/her case and  coordinating his/her care.   Nicholos Johns, PA-C    Tyler Pita, MD  [facility] Direct Dial: [phone]  Fax: [phone] [email or web address]  Skype  [email or web address]

## 2019-04-20 NOTE — Progress Notes (Signed)
  Radiation Oncology         [phone]) [phone] ________________________________  Name: Mitchell Lowery MRN: [id number]  Date: 04/20/2019  DOB: 1934-11-21  SIMULATION AND TREATMENT PLANNING NOTE  No diagnosis found.  DIAGNOSIS:  85 y.o. patient with lumbar 5 and right chest wall myeloma  NARRATIVE:  The patient was brought to the [facility].  Identity was confirmed.  All relevant records and images related to the planned course of therapy were reviewed.  The patient freely provided informed written consent to proceed with treatment after reviewing the details related to the planned course of therapy. The consent form was witnessed and verified by the simulation staff.  Then, the patient was set-up in a stable reproducible  supine position for radiation therapy.  CT images were obtained.  Surface markings were placed.  The CT images were loaded into the planning software.  Then the target and avoidance structures were contoured including kidneys.  Treatment planning then occurred.  The radiation prescription was entered and confirmed.  Then, I designed and supervised the construction of a total of 3 medically necessary complex treatment devices with VacLoc positioner and 4 MLCs to shield kidneys on lumbar and liver on right chest wall.  I have requested : 3D Simulation  I have requested a DVH of the following structures: Left Kidney, Right Kidney and target.  PLAN:  The patient will receive 20 Gy in 10 fractions.  ________________________________  Sheral Apley Tammi Klippel, M.D.

## 2019-04-20 NOTE — Progress Notes (Addendum)
Histology and Location of Primary Cancer: Multiple myeloma  Sites of Visceral and Bony Metastatic Disease: hypermetabolic right axillary/chest wall nodes,  right paravertebral mass, small right pleural effusion,  retrocrural node, right lateral abdominal wall mass, L5 lesion mild compression deformity.  Focus of hypermetabolism at the anterior left pelvic wall without a CT correlate.  Hypermetabolic lytic lesion in the left T3 transverse process  Location(s) of Symptomatic Metastases: Painful right lateral rib cage and L5 lesion with mild compression deformity.  Past/Anticipated chemotherapy by medical oncology, if any: Completed cycle 1 of daratumumab and carfilzomib on 04/05/19. [place]rill plans to resume Zometa soon.   Pain on a scale of 0-10 is: Patient reports low back and right lateral rib pain is intermittent and worse with movement. Patient explains while sitting still or laying in bed he has no pain. Reports the pain is the worst when he stands up. Confirms this pain is worse first thing in the morning. Reports taking Tylenol to manage pain. Reports pain is making it difficult for him to exercise.  Patient reports neuropathy in both feet x years that is unchanged. Reports his balance is worse. Denies any recent falls.   If Spine Met(s), symptoms, if any, include:  Bowel/Bladder retention or incontinence (please describe): Reports diarrhea related to infusions. Denies change in bowel or bladder continence.  Numbness or weakness in extremities (please describe): Reports bilateral leg weakness worse in left upper leg.  Current Decadron regimen, if applicable: no  Ambulatory status? Walker? Wheelchair?: Ambulatory  SAFETY ISSUES:  Prior radiation? yes, for prostate cancer  Pacemaker/ICD? no  Possible current pregnancy? no, male patient  Is the patient on methotrexate? 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 MM recommending 20  Gy in 10 fractions.

## 2019-04-20 NOTE — Progress Notes (Signed)
See progress note under physician encounter. 

## 2019-04-21 NOTE — Progress Notes (Signed)
Pharmacist Chemotherapy Monitoring - Follow Up Assessment    I verify that I have reviewed each item in the below checklist:  . Regimen for the patient is scheduled for the appropriate day and plan matches scheduled date. Marland Kitchen Appropriate non-routine labs are ordered dependent on drug ordered. . If applicable, additional medications reviewed and ordered per protocol based on lifetime cumulative doses and/or treatment regimen.   Plan for follow-up and/or issues identified: Yes . I-vent associated with next due treatment: Yes . MD and/or nursing notified: No  Britt Boozer 04/21/2019 4:23 PM

## 2019-04-22 DIAGNOSIS — Z510 Encounter for antineoplastic radiation therapy: Secondary | ICD-10-CM | POA: Diagnosis not present

## 2019-04-24 ENCOUNTER — Other Ambulatory Visit: Payer: Self-pay | Admitting: Oncology

## 2019-04-25 ENCOUNTER — Ambulatory Visit
Admission: RE | Admit: 2019-04-25 | Discharge: 2019-04-25 | Disposition: A | Discharge status: Home / Self Care | Payer: Medicare PPO | Source: Ambulatory Visit | Attending: Radiation Oncology | Admitting: Radiation Oncology

## 2019-04-25 ENCOUNTER — Other Ambulatory Visit: Payer: Self-pay

## 2019-04-25 DIAGNOSIS — Z510 Encounter for antineoplastic radiation therapy: Secondary | ICD-10-CM | POA: Diagnosis not present

## 2019-04-25 MED FILL — XARELTO 20 MG TABLET: 20 | 30 days supply | Qty: 30 | Fill #1

## 2019-04-26 ENCOUNTER — Other Ambulatory Visit: Payer: Self-pay

## 2019-04-26 ENCOUNTER — Ambulatory Visit
Admission: RE | Admit: 2019-04-26 | Discharge: 2019-04-26 | Disposition: A | Discharge status: Home / Self Care | Payer: Medicare PPO | Source: Ambulatory Visit | Attending: Radiation Oncology | Admitting: Radiation Oncology

## 2019-04-26 DIAGNOSIS — Z510 Encounter for antineoplastic radiation therapy: Secondary | ICD-10-CM | POA: Diagnosis not present

## 2019-04-27 ENCOUNTER — Ambulatory Visit
Admission: RE | Admit: 2019-04-27 | Discharge: 2019-04-27 | Disposition: A | Discharge status: Home / Self Care | Payer: Medicare PPO | Source: Ambulatory Visit | Attending: Radiation Oncology | Admitting: Radiation Oncology

## 2019-04-27 ENCOUNTER — Inpatient Hospital Stay: Payer: Medicare PPO

## 2019-04-27 ENCOUNTER — Inpatient Hospital Stay (HOSPITAL_BASED_OUTPATIENT_CLINIC_OR_DEPARTMENT_OTHER): Payer: Medicare PPO | Admitting: Oncology

## 2019-04-27 ENCOUNTER — Other Ambulatory Visit: Payer: Self-pay

## 2019-04-27 VITALS — BP 121/71 | HR 68 | Temp 97.1°F | Resp 17 | Ht 71.0 in | Wt 181.4 lb

## 2019-04-27 DIAGNOSIS — C9000 Multiple myeloma not having achieved remission: Secondary | ICD-10-CM

## 2019-04-27 DIAGNOSIS — Z510 Encounter for antineoplastic radiation therapy: Secondary | ICD-10-CM | POA: Diagnosis not present

## 2019-04-27 DIAGNOSIS — Z5111 Encounter for antineoplastic chemotherapy: Secondary | ICD-10-CM | POA: Diagnosis not present

## 2019-04-27 LAB — CMP (CANCER CENTER ONLY)
ALT: 12 U/L (ref 0–44)
AST: 15 U/L (ref 15–41)
Albumin: 3.2 g/dL — ABNORMAL LOW (ref 3.5–5.0)
Alkaline Phosphatase: 53 U/L (ref 38–126)
Anion gap: 7 (ref 5–15)
BUN: 9 mg/dL (ref 8–23)
CO2: 24 mmol/L (ref 22–32)
Calcium: 8.7 mg/dL — ABNORMAL LOW (ref 8.9–10.3)
Chloride: 110 mmol/L (ref 98–111)
Creatinine: 0.74 mg/dL (ref 0.61–1.24)
GFR, Est AFR Am: >60 mL/min (ref >60)
GFR, Estimated: >60 mL/min (ref >60)
Glucose, Bld: 93 mg/dL (ref 70–99)
Potassium: 4 mmol/L (ref 3.5–5.1)
Sodium: 141 mmol/L (ref 135–145)
Total Bilirubin: 0.8 mg/dL (ref 0.3–1.2)
Total Protein: 5.9 g/dL — ABNORMAL LOW (ref 6.5–8.1)

## 2019-04-27 LAB — CBC WITH DIFFERENTIAL (CANCER CENTER ONLY)
Abs Immature Granulocytes: 0.02 10*3/uL (ref 0.00–0.07)
Basophils Absolute: 0 10*3/uL (ref 0.0–0.1)
Basophils Relative: 1 %
Eosinophils Absolute: 0 10*3/uL (ref 0.0–0.5)
Eosinophils Relative: 2 %
HCT: 42.5 % (ref 39.0–52.0)
Hemoglobin: 13.8 g/dL (ref 13.0–17.0)
Immature Granulocytes: 1 %
Lymphocytes Relative: 8 %
Lymphs Abs: 0.2 10*3/uL — ABNORMAL LOW (ref 0.7–4.0)
MCH: 27.9 pg (ref 26.0–34.0)
MCHC: 32.5 g/dL (ref 30.0–36.0)
MCV: 85.9 fL (ref 80.0–100.0)
Monocytes Absolute: 0.6 10*3/uL (ref 0.1–1.0)
Monocytes Relative: 30 %
Neutro Abs: 1.2 10*3/uL — ABNORMAL LOW (ref 1.7–7.7)
Neutrophils Relative %: 58 %
Platelet Count: 77 10*3/uL — ABNORMAL LOW (ref 150–400)
RBC: 4.95 MIL/uL (ref 4.22–5.81)
RDW: 16.8 % — ABNORMAL HIGH (ref 11.5–15.5)
WBC Count: 2 10*3/uL — ABNORMAL LOW (ref 4.0–10.5)
nRBC: 0 % (ref 0.0–0.2)

## 2019-04-27 MED ORDER — MONTELUKAST SODIUM 10 MG PO TABS
ORAL_TABLET | ORAL | Status: AC
  Filled 2019-04-27: qty 1

## 2019-04-27 MED ORDER — DEXAMETHASONE 4 MG PO TABS
20.0000 mg | ORAL_TABLET | Freq: Once | ORAL | Status: AC
  Administered 2019-04-27: 20 mg via ORAL

## 2019-04-27 MED ORDER — ACETAMINOPHEN 325 MG PO TABS
ORAL_TABLET | ORAL | Status: AC
  Filled 2019-04-27: qty 2

## 2019-04-27 MED ORDER — ACETAMINOPHEN 325 MG PO TABS
650.0000 mg | ORAL_TABLET | Freq: Once | ORAL | Status: AC
  Administered 2019-04-27: 650 mg via ORAL

## 2019-04-27 MED ORDER — DIPHENHYDRAMINE HCL 25 MG PO CAPS
50.0000 mg | ORAL_CAPSULE | Freq: Once | ORAL | Status: AC
  Administered 2019-04-27: 50 mg via ORAL

## 2019-04-27 MED ORDER — DIPHENHYDRAMINE HCL 25 MG PO CAPS
ORAL_CAPSULE | ORAL | Status: AC
  Filled 2019-04-27: qty 2

## 2019-04-27 MED ORDER — DEXAMETHASONE 4 MG PO TABS
ORAL_TABLET | ORAL | Status: AC
  Filled 2019-04-27: qty 5

## 2019-04-27 MED ORDER — MONTELUKAST SODIUM 10 MG PO TABS
10.0000 mg | ORAL_TABLET | Freq: Once | ORAL | Status: AC
  Administered 2019-04-27: 10 mg via ORAL

## 2019-04-27 MED ORDER — DARATUMUMAB-HYALURONIDASE-FIHJ 1800-30000 MG-UT/15ML ~~LOC~~ SOLN
1800.0000 mg | Freq: Once | SUBCUTANEOUS | Status: AC
  Administered 2019-04-27: 1800 mg via SUBCUTANEOUS
  Filled 2019-04-27: qty 15

## 2019-04-27 NOTE — Patient Instructions (Signed)
[facility] Discharge Instructions for Patients Receiving Chemotherapy  Today you received the following chemotherapy agents Faspro.  To help prevent nausea and vomiting after your treatment, we encourage you to take your nausea medication    If you develop nausea and vomiting that is not controlled by your nausea medication, call the clinic.   BELOW ARE SYMPTOMS THAT SHOULD BE REPORTED IMMEDIATELY:  *FEVER GREATER THAN 100.5 F  *CHILLS WITH OR WITHOUT FEVER  NAUSEA AND VOMITING THAT IS NOT CONTROLLED WITH YOUR NAUSEA MEDICATION  *UNUSUAL SHORTNESS OF BREATH  *UNUSUAL BRUISING OR BLEEDING  TENDERNESS IN MOUTH AND THROAT WITH OR WITHOUT PRESENCE OF ULCERS  *URINARY PROBLEMS  *BOWEL PROBLEMS  UNUSUAL RASH Items with * indicate a potential emergency and should be followed up as soon as possible.  Feel free to call the clinic should you have any questions or concerns. The clinic phone number is [phone].  Please show the [facility] at check-in to the Emergency Department and triage nurse.

## 2019-04-27 NOTE — Progress Notes (Signed)
Per [name]: OK to treat w/ANC 1.2 and platelet count 77,000.

## 2019-04-27 NOTE — Progress Notes (Signed)
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  He has completed 3 weeks of daratumumab/carfilzomib.  He has tolerated the treatment well.  He complains of insomnia on the evening of treatment.  No respiratory symptoms.  He continues to have discomfort at the right chest wall and left "hip ".  The hip discomfort limits his ability to ambulate.  He takes Tylenol for pain.  He has irregular bowel habits with intermittent diarrhea.  Objective:  Vital signs in last 24 hours:  Blood pressure 121/71, pulse 68, temperature (!) 97.1 F (36.2 C), temperature source Temporal, resp. rate 17, height '5\' 11"'  (1.803 m), weight 181 lb 6.4 oz (82.3 kg), SpO2 97 %.     Resp: Lungs clear bilaterally Cardio: Regular rate and rhythm with premature beats, 2/6 systolic murmur GI: Nontender, no hepatosplenomegaly Vascular: Trace lower pretibial edema bilaterally Musculoskeletal: Right chest wall mass appears smaller   Lab Results:  Lab Results  Component Value Date   WBC 2.0 (L) 04/27/2019   HGB 13.8 04/27/2019   HCT 42.5 04/27/2019   MCV 85.9 04/27/2019   PLT 77 (L) 04/27/2019   NEUTROABS 1.2 (L) 04/27/2019    CMP  Lab Results  Component Value Date   NA 141 04/27/2019   K 4.0 04/27/2019   CL 110 04/27/2019   CO2 24 04/27/2019   GLUCOSE 93 04/27/2019   BUN 9 04/27/2019   CREATININE 0.74 04/27/2019   CALCIUM 8.7 (L) 04/27/2019   PROT 5.9 (L) 04/27/2019   ALBUMIN 3.2 (L) 04/27/2019   AST 15 04/27/2019   ALT 12 04/27/2019   ALKPHOS 53 04/27/2019   BILITOT 0.8 04/27/2019   GFRNONAA >60 04/27/2019   GFRAA >60 04/27/2019     Medications: I have reviewed the patient's current medications.   Assessment/Plan: 1. Multiple myeloma-confirmed on a bone marrow biopsy 08/07/2014, IgA lambda  Serum M spike and increased serum free lambda light chains  Myeloma FISH panel negative for chromosome 4, 11,  12, 13, 14, and 17 abnormalities, cytogenetics with no metaphases  Bone survey 08/15/2014 with indeterminant lucent skull lesions  Cycle 1 RVD 08/22/2014  Cycle 2 RVD 09/19/2014  Cycle 3 RVD 10/17/2014  Cycle 4 RVD 11/14/2014 (Decadron reduced to 20 mg weekly, Revlimid 15 mg days 1 through 14, Velcade 3/4 weeks)  Cycle 5 RVD 12/12/2014  Serum M spike not detected 12/26/2014  Cycle 6 RVD 01/09/2015  Maintenance Revlimid, 10 mg daily, 02/05/2015 , discontinue 02/26/2015 secondary to leg edema and diarrhea  Revlimid resumed at a dose of 10 mg every other day beginning 03/13/2015  PET scan 74/14/2395-VU hypermetabolic bone lesions, few lucent lesions in the spine and pelvis  Revlimid discontinued January 2019  Enrollment on a clinical trial at [facility] with pomalidomide/Decadron+/- ixazomibbeginning 03/10/2017  Treatment discontinued February 2021 secondary to rising serum lambda light chains  PET 04/05/2019-hypermetabolic right axillary/chest wall nodes,  right paravertebral mass, small right pleural effusion,  retrocrural node, right lateral abdominal wall mass, L5 lesion mild compression deformity.  Focus of hypermetabolism at the anterior left pelvic wall without a CT correlate.  Hypermetabolic lytic lesion in the left T3 transverse process  Cycle 1 daratumumab/carfilzomib/Decadron 04/05/2019  Radiation to the right chest wall mass and lumbar spine beginning 04/25/2019  2. 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  History of intermittent prostatitis  4. Skin rash 10/24/2014-referred to dermatology, biopsy consistent with local hypersensitivity reaction  5. 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 Left lower leg  cellulitis 06/24/2015-treated with Keflex  7. History of mild thrombocytopenia secondary tomyeloma and systemic therapy  8. History of mild neutropenia-secondary to myeloma and systemic therapy  9. Fall with a skull fracture and subarachnoid blood/right frontal lobe contusions 10/17/2015  10. Right lower lobe pulmonary embolus 03/28/2016. Lovenox , transitioned to Xarelto  11. Vesicular rashJune 2018-potentially related to the zoster vaccine, resolved  12.  History of atrial fibrillation  13.  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  He has mild neutropenia and thrombocytopenia, most likely secondary to carfilzomib.  Radiation and daratumumab may also be contributing.  He will complete day 22 daratumumab therapy today.  We will repeat a myeloma panel when he returns to begin cycle 2 next week.  He continues palliative radiation for treatment of the myeloma lesions involving the right chest wall and lumbar spine.  He plans to begin ambulating with a cane.  He will call for a fever or bleeding.  The diarrhea is likely secondary to carfilzomib and daratumumab.  He will use Lomotil as needed.  [name], MD  04/27/2019  9:10 AM

## 2019-04-28 ENCOUNTER — Telehealth: Payer: Self-pay | Admitting: *Deleted

## 2019-04-28 ENCOUNTER — Other Ambulatory Visit: Payer: Self-pay

## 2019-04-28 ENCOUNTER — Ambulatory Visit
Admission: RE | Admit: 2019-04-28 | Discharge: 2019-04-28 | Disposition: A | Discharge status: Home / Self Care | Payer: Medicare PPO | Source: Ambulatory Visit | Attending: Radiation Oncology | Admitting: Radiation Oncology

## 2019-04-28 DIAGNOSIS — Z510 Encounter for antineoplastic radiation therapy: Secondary | ICD-10-CM | POA: Diagnosis not present

## 2019-04-28 DIAGNOSIS — C9000 Multiple myeloma not having achieved remission: Secondary | ICD-10-CM | POA: Insufficient documentation

## 2019-04-28 NOTE — Telephone Encounter (Signed)
Patient called to ask when does he need to be here on 05/04/19--has 15 Mychart message emails and they confuse him. Informed him to be here for lab at 10:15 (not on schedule), then see NP and tx. Scheduling message sent to add lab appointment.

## 2019-04-29 ENCOUNTER — Telehealth: Payer: Self-pay | Admitting: Oncology

## 2019-04-29 ENCOUNTER — Other Ambulatory Visit: Payer: Self-pay

## 2019-04-29 ENCOUNTER — Ambulatory Visit
Admission: RE | Admit: 2019-04-29 | Discharge: 2019-04-29 | Disposition: A | Discharge status: Home / Self Care | Payer: Medicare PPO | Source: Ambulatory Visit | Attending: Radiation Oncology | Admitting: Radiation Oncology

## 2019-04-29 DIAGNOSIS — Z510 Encounter for antineoplastic radiation therapy: Secondary | ICD-10-CM | POA: Diagnosis not present

## 2019-04-29 NOTE — Progress Notes (Signed)

## 2019-04-29 NOTE — Telephone Encounter (Signed)
Scheduled per los. Called and spoke with patient. Confirmed appt 

## 2019-05-01 ENCOUNTER — Other Ambulatory Visit: Payer: Self-pay | Admitting: Oncology

## 2019-05-02 ENCOUNTER — Other Ambulatory Visit: Payer: Self-pay

## 2019-05-02 ENCOUNTER — Ambulatory Visit
Admission: RE | Admit: 2019-05-02 | Discharge: 2019-05-02 | Disposition: A | Discharge status: Home / Self Care | Payer: Medicare PPO | Source: Ambulatory Visit | Attending: Radiation Oncology | Admitting: Radiation Oncology

## 2019-05-02 DIAGNOSIS — Z510 Encounter for antineoplastic radiation therapy: Secondary | ICD-10-CM | POA: Diagnosis not present

## 2019-05-03 ENCOUNTER — Other Ambulatory Visit: Payer: Self-pay

## 2019-05-03 ENCOUNTER — Ambulatory Visit
Admission: RE | Admit: 2019-05-03 | Discharge: 2019-05-03 | Disposition: A | Discharge status: Home / Self Care | Payer: Medicare PPO | Source: Ambulatory Visit | Attending: Radiation Oncology | Admitting: Radiation Oncology

## 2019-05-03 DIAGNOSIS — Z510 Encounter for antineoplastic radiation therapy: Secondary | ICD-10-CM | POA: Diagnosis not present

## 2019-05-04 ENCOUNTER — Encounter: Payer: Self-pay | Admitting: Nurse Practitioner

## 2019-05-04 ENCOUNTER — Inpatient Hospital Stay: Payer: Medicare PPO | Attending: Oncology | Admitting: Nurse Practitioner

## 2019-05-04 ENCOUNTER — Inpatient Hospital Stay: Payer: Medicare PPO

## 2019-05-04 ENCOUNTER — Ambulatory Visit: Admission: RE | Admit: 2019-05-04 | Payer: Medicare PPO | Source: Ambulatory Visit

## 2019-05-04 ENCOUNTER — Other Ambulatory Visit: Payer: Self-pay

## 2019-05-04 VITALS — BP 116/66 | HR 80 | Temp 98.0°F | Resp 17 | Ht 71.0 in | Wt 178.5 lb

## 2019-05-04 DIAGNOSIS — C9000 Multiple myeloma not having achieved remission: Secondary | ICD-10-CM | POA: Diagnosis present

## 2019-05-04 DIAGNOSIS — Z8546 Personal history of malignant neoplasm of prostate: Secondary | ICD-10-CM | POA: Diagnosis not present

## 2019-05-04 DIAGNOSIS — Z79899 Other long term (current) drug therapy: Secondary | ICD-10-CM | POA: Diagnosis not present

## 2019-05-04 DIAGNOSIS — Z923 Personal history of irradiation: Secondary | ICD-10-CM | POA: Insufficient documentation

## 2019-05-04 DIAGNOSIS — Z5111 Encounter for antineoplastic chemotherapy: Secondary | ICD-10-CM | POA: Diagnosis present

## 2019-05-04 LAB — CBC WITH DIFFERENTIAL (CANCER CENTER ONLY)
Abs Immature Granulocytes: 0.01 10*3/uL (ref 0.00–0.07)
Basophils Absolute: 0 10*3/uL (ref 0.0–0.1)
Basophils Relative: 1 %
Eosinophils Absolute: 0 10*3/uL (ref 0.0–0.5)
Eosinophils Relative: 1 %
HCT: 42.4 % (ref 39.0–52.0)
Hemoglobin: 13.9 g/dL (ref 13.0–17.0)
Immature Granulocytes: 1 %
Lymphocytes Relative: 12 %
Lymphs Abs: 0.3 10*3/uL — ABNORMAL LOW (ref 0.7–4.0)
MCH: 28.3 pg (ref 26.0–34.0)
MCHC: 32.8 g/dL (ref 30.0–36.0)
MCV: 86.4 fL (ref 80.0–100.0)
Monocytes Absolute: 0.4 10*3/uL (ref 0.1–1.0)
Monocytes Relative: 17 %
Neutro Abs: 1.5 10*3/uL — ABNORMAL LOW (ref 1.7–7.7)
Neutrophils Relative %: 68 %
Platelet Count: 94 10*3/uL — ABNORMAL LOW (ref 150–400)
RBC: 4.91 MIL/uL (ref 4.22–5.81)
RDW: 16.7 % — ABNORMAL HIGH (ref 11.5–15.5)
WBC Count: 2.2 10*3/uL — ABNORMAL LOW (ref 4.0–10.5)
nRBC: 0 % (ref 0.0–0.2)

## 2019-05-04 LAB — CMP (CANCER CENTER ONLY)
ALT: 7 U/L (ref 0–44)
AST: 11 U/L — ABNORMAL LOW (ref 15–41)
Albumin: 3.2 g/dL — ABNORMAL LOW (ref 3.5–5.0)
Alkaline Phosphatase: 56 U/L (ref 38–126)
Anion gap: 7 (ref 5–15)
BUN: 10 mg/dL (ref 8–23)
CO2: 25 mmol/L (ref 22–32)
Calcium: 8.8 mg/dL — ABNORMAL LOW (ref 8.9–10.3)
Chloride: 110 mmol/L (ref 98–111)
Creatinine: 0.71 mg/dL (ref 0.61–1.24)
GFR, Est AFR Am: >60 mL/min (ref >60)
GFR, Estimated: >60 mL/min (ref >60)
Glucose, Bld: 120 mg/dL — ABNORMAL HIGH (ref 70–99)
Potassium: 3.8 mmol/L (ref 3.5–5.1)
Sodium: 142 mmol/L (ref 135–145)
Total Bilirubin: 0.6 mg/dL (ref 0.3–1.2)
Total Protein: 5.8 g/dL — ABNORMAL LOW (ref 6.5–8.1)

## 2019-05-04 MED ORDER — ACETAMINOPHEN 325 MG PO TABS
650.0000 mg | ORAL_TABLET | Freq: Once | ORAL | Status: AC
  Administered 2019-05-04: 650 mg via ORAL

## 2019-05-04 MED ORDER — SODIUM CHLORIDE 0.9% FLUSH
10.0000 mL | INTRAVENOUS | Status: DC | PRN
  Filled 2019-05-04: qty 10

## 2019-05-04 MED ORDER — MONTELUKAST SODIUM 10 MG PO TABS
ORAL_TABLET | ORAL | Status: AC
  Filled 2019-05-04: qty 1

## 2019-05-04 MED ORDER — DEXTROSE 5 % IV SOLN
68.0000 mg/m2 | Freq: Once | INTRAVENOUS | Status: AC
  Administered 2019-05-04: 140 mg via INTRAVENOUS
  Filled 2019-05-04: qty 60

## 2019-05-04 MED ORDER — DIPHENHYDRAMINE HCL 25 MG PO CAPS
50.0000 mg | ORAL_CAPSULE | Freq: Once | ORAL | Status: AC
  Administered 2019-05-04: 50 mg via ORAL

## 2019-05-04 MED ORDER — SODIUM CHLORIDE 0.9 % IV SOLN
INTRAVENOUS | Status: DC
  Filled 2019-05-04: qty 250

## 2019-05-04 MED ORDER — MONTELUKAST SODIUM 10 MG PO TABS
10.0000 mg | ORAL_TABLET | Freq: Once | ORAL | Status: AC
  Administered 2019-05-04: 10 mg via ORAL

## 2019-05-04 MED ORDER — ACETAMINOPHEN 325 MG PO TABS
ORAL_TABLET | ORAL | Status: AC
  Filled 2019-05-04: qty 2

## 2019-05-04 MED ORDER — DEXAMETHASONE 4 MG PO TABS
ORAL_TABLET | ORAL | Status: AC
  Filled 2019-05-04: qty 5

## 2019-05-04 MED ORDER — DIPHENHYDRAMINE HCL 25 MG PO CAPS
ORAL_CAPSULE | ORAL | Status: AC
  Filled 2019-05-04: qty 2

## 2019-05-04 MED ORDER — DEXAMETHASONE 4 MG PO TABS
20.0000 mg | ORAL_TABLET | Freq: Once | ORAL | Status: AC
  Administered 2019-05-04: 20 mg via ORAL

## 2019-05-04 MED ORDER — DARATUMUMAB-HYALURONIDASE-FIHJ 1800-30000 MG-UT/15ML ~~LOC~~ SOLN
1800.0000 mg | Freq: Once | SUBCUTANEOUS | Status: AC
  Administered 2019-05-04: 14:00:00 1800 mg via SUBCUTANEOUS
  Filled 2019-05-04: qty 15

## 2019-05-04 MED ORDER — ACETAMINOPHEN 325 MG PO TABS
ORAL_TABLET | ORAL | Status: AC
  Filled 2019-05-04: qty 1

## 2019-05-04 MED ORDER — DIPHENHYDRAMINE HCL 25 MG PO CAPS
ORAL_CAPSULE | ORAL | Status: AC
  Filled 2019-05-04: qty 1

## 2019-05-04 MED ORDER — HEPARIN SOD (PORK) LOCK FLUSH 100 UNIT/ML IV SOLN
500.0000 [IU] | Freq: Once | INTRAVENOUS | Status: DC | PRN
  Filled 2019-05-04: qty 5

## 2019-05-04 NOTE — Patient Instructions (Signed)
[facility] Discharge Instructions for Patients Receiving Chemotherapy  Today you received the following chemotherapy agents: carfilzomib and daratumumab.  To help prevent nausea and vomiting after your treatment, we encourage you to take your nausea medication as directed.   If you develop nausea and vomiting that is not controlled by your nausea medication, call the clinic.   BELOW ARE SYMPTOMS THAT SHOULD BE REPORTED IMMEDIATELY:  *FEVER GREATER THAN 100.5 F  *CHILLS WITH OR WITHOUT FEVER  NAUSEA AND VOMITING THAT IS NOT CONTROLLED WITH YOUR NAUSEA MEDICATION  *UNUSUAL SHORTNESS OF BREATH  *UNUSUAL BRUISING OR BLEEDING  TENDERNESS IN MOUTH AND THROAT WITH OR WITHOUT PRESENCE OF ULCERS  *URINARY PROBLEMS  *BOWEL PROBLEMS  UNUSUAL RASH Items with * indicate a potential emergency and should be followed up as soon as possible.  Feel free to call the clinic should you have any questions or concerns. The clinic phone number is [phone].  Please show the CHEMO ALERT CARD at check-in to the Emergency Department and triage nurse.   

## 2019-05-04 NOTE — Progress Notes (Signed)
[place] OFFICE PROGRESS NOTE   Diagnosis: Multiple myeloma  INTERVAL HISTORY:   Mitchell Lowery returns as scheduled.  He completed day 22 daratumumab on 04/27/2019.  He denies nausea/vomiting.  Bowels tend to alternate constipation and diarrhea.  No rash.  He denies bleeding.  No mouth sores.  He periodically feels short of breath with walking.  No chest pain associated with the dyspnea.  No wheezing.  No fever or cough.  The right chest wall mass is much smaller and he is having significantly less pain.  Objective:  Vital signs in last 24 hours:  Blood pressure 116/66, pulse 80, temperature 98 F (36.7 C), temperature source Temporal, resp. rate 17, height '5\' 11"'$  (1.803 m), weight 178 lb 8 oz (81 kg), SpO2 96 %.    HEENT: No thrush or ulcers. Resp: Lungs clear bilaterally. Cardio: Regular rate and rhythm. GI: No hepatosplenomegaly. Vascular: Trace edema at the lower legs bilaterally. Musculoskeletal: Right chest wall mass is smaller.   Lab Results:  Lab Results  Component Value Date   WBC 2.2 (L) 05/04/2019   HGB 13.9 05/04/2019   HCT 42.4 05/04/2019   MCV 86.4 05/04/2019   PLT 94 (L) 05/04/2019   NEUTROABS 1.5 (L) 05/04/2019    Imaging:  No results found.  Medications: I have reviewed the patient's current medications.  Assessment/Plan: 1. Multiple myeloma-confirmed on a bone marrow biopsy 08/07/2014, IgA lambda  Serum M spike and increased serum free lambda light chains  Myeloma FISH panel negative for chromosome 4, 11, 12, 13, 14, and 17 abnormalities, cytogenetics with no metaphases  Bone survey 08/15/2014 with indeterminant lucent skull lesions  Cycle 1 RVD 08/22/2014  Cycle 2 RVD 09/19/2014  Cycle 3 RVD 10/17/2014  Cycle 4 RVD 11/14/2014 (Decadron reduced to 20 mg weekly, Revlimid 15 mg days 1 through 14, Velcade 3/4 weeks)  Cycle 5 RVD 12/12/2014  Serum M spike not detected 12/26/2014  Cycle 6 RVD 01/09/2015  Maintenance  Revlimid, 10 mg daily, 02/05/2015 , discontinue 02/26/2015 secondary to leg edema and diarrhea  Revlimid resumed at a dose of 10 mg every other day beginning 03/13/2015  PET scan 48/54/6270-JJ hypermetabolic bone lesions, few lucent lesions in the spine and pelvis  Revlimid discontinued January 2019  Enrollment on a clinical trial at [facility] with pomalidomide/Decadron+/- ixazomibbeginning 03/10/2017  Treatment discontinued February 2021 secondary to rising serum lambda light chains  PET 04/05/2019-hypermetabolic right axillary/chest wall nodes,  right paravertebral mass, small right pleural effusion,  retrocrural node, right lateral abdominal wall mass, L5 lesion mild compression deformity.  Focus of hypermetabolism at the anterior left pelvic wall without a CT correlate.  Hypermetabolic lytic lesion in the left T3 transverse process  Cycle 1 daratumumab/carfilzomib/Decadron 04/05/2019  Radiation to the right chest wall mass and lumbar spine beginning 04/25/2019  Cycle 2 daratumumab/carfilzomib/Decadron 05/04/2019  2. 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 History of intermittent prostatitis  4. Skin rash 10/24/2014-referred to dermatology, biopsy consistent with local hypersensitivity reaction  5. 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 Left lower leg cellulitis 06/24/2015-treated with Keflex  7. History of mild thrombocytopenia secondary tomyeloma and systemic therapy  8. History of mild neutropenia-secondary to myeloma and systemic therapy  9. Fall with a skull fracture and subarachnoid blood/right frontal lobe contusions 10/17/2015  10. Right  lower lobe pulmonary embolus 03/28/2016. Lovenox ,  transitioned to Xarelto  11. Vesicular rashJune 2018-potentially related to the zoster vaccine, resolved  12.  History of atrial fibrillation  13.  Aortic stenosis   Disposition: Mitchell Lowery appears stable.  He has completed 1 cycle of daratumumab/carfilzomib/Decadron.  He is tolerating treatment well.  Plan to proceed with cycle 2 today as scheduled.  The Carfilzomib dose will be escalated to 70 mg/m.  We will follow-up on the myeloma panel from today.  He will return for daratumumab/Carfilzomib in 1 week.  We will see him in follow-up in 2 weeks.  He will contact the office in the interim with any problems.  Patient seen with [name].    Ned Card ANP/GNP-BC   05/04/2019  11:12 AM This was a shared visit with Ned Card.  Mitchell Lowery was interviewed and examined.  He is tolerating the daratumumab and carfilzomib well.  His pain has improved and the right chest wall mass is smaller.  He will begin cycle 2 today.  The carfilzomib will be dose escalated to 70 mg/m based on the recommendation of Dr. Amalia Hailey.  We will follow up on the myeloma panel from today.     Julieanne Manson, MD

## 2019-05-04 NOTE — Progress Notes (Signed)
Per [name]: OK to treat w/ANC 1.5 and platelets 94.

## 2019-05-04 NOTE — Progress Notes (Signed)
Spoke w/ [name], pre-carfilzomib fluid bolus will be removed from treatment starting today due to cardiac comorbidities. No renal toxicity noted thus far in carfilzomib treatment.   Demetrius Charity, PharmD, BCPS, [place] [facility] Pharmacist Pharmacy Phone: [phone] 05/04/2019

## 2019-05-05 ENCOUNTER — Telehealth: Payer: Self-pay | Admitting: Nurse Practitioner

## 2019-05-05 ENCOUNTER — Ambulatory Visit
Admission: RE | Admit: 2019-05-05 | Discharge: 2019-05-05 | Disposition: A | Discharge status: Home / Self Care | Payer: Medicare PPO | Source: Ambulatory Visit | Attending: Radiation Oncology | Admitting: Radiation Oncology

## 2019-05-05 ENCOUNTER — Other Ambulatory Visit: Payer: Self-pay

## 2019-05-05 DIAGNOSIS — Z510 Encounter for antineoplastic radiation therapy: Secondary | ICD-10-CM | POA: Diagnosis not present

## 2019-05-05 LAB — KAPPA/LAMBDA LIGHT CHAINS
Kappa free light chain: 1.4 mg/L — ABNORMAL LOW (ref 3.3–19.4)
Kappa, lambda light chain ratio: 0.02 — ABNORMAL LOW (ref 0.26–1.65)
Lambda free light chains: 57.5 mg/L — ABNORMAL HIGH (ref 5.7–26.3)

## 2019-05-05 LAB — IGA: IgA: 28 mg/dL — ABNORMAL LOW (ref 61–437)

## 2019-05-05 NOTE — Telephone Encounter (Signed)
Scheduled appt per 4/7 los.  Left a vm of the appt date and time. 

## 2019-05-06 ENCOUNTER — Ambulatory Visit
Admission: RE | Admit: 2019-05-06 | Discharge: 2019-05-06 | Disposition: A | Discharge status: Home / Self Care | Payer: Medicare PPO | Source: Ambulatory Visit | Attending: Radiation Oncology | Admitting: Radiation Oncology

## 2019-05-06 ENCOUNTER — Other Ambulatory Visit: Payer: Self-pay

## 2019-05-06 DIAGNOSIS — Z510 Encounter for antineoplastic radiation therapy: Secondary | ICD-10-CM | POA: Diagnosis not present

## 2019-05-06 NOTE — Progress Notes (Signed)
Pharmacist Chemotherapy Monitoring - Follow Up Assessment    I verify that I have reviewed each item in the below checklist:  . Regimen for the patient is scheduled for the appropriate day and plan matches scheduled date. Marland Kitchen Appropriate non-routine labs are ordered dependent on drug ordered. . If applicable, additional medications reviewed and ordered per protocol based on lifetime cumulative doses and/or treatment regimen.   Plan for follow-up and/or issues identified: No . I-vent associated with next due treatment: No . MD and/or nursing notified: No  Romualdo Bolk [facility] 05/06/2019 1:16 PM

## 2019-05-08 ENCOUNTER — Other Ambulatory Visit: Payer: Self-pay | Admitting: Oncology

## 2019-05-09 ENCOUNTER — Other Ambulatory Visit: Payer: Self-pay

## 2019-05-09 ENCOUNTER — Encounter: Payer: Self-pay | Admitting: Radiation Oncology

## 2019-05-09 ENCOUNTER — Ambulatory Visit
Admission: RE | Admit: 2019-05-09 | Discharge: 2019-05-09 | Disposition: A | Discharge status: Home / Self Care | Payer: Medicare PPO | Source: Ambulatory Visit | Attending: Radiation Oncology | Admitting: Radiation Oncology

## 2019-05-09 DIAGNOSIS — Z510 Encounter for antineoplastic radiation therapy: Secondary | ICD-10-CM | POA: Diagnosis not present

## 2019-05-11 ENCOUNTER — Inpatient Hospital Stay: Payer: Medicare PPO | Admitting: Nurse Practitioner

## 2019-05-11 ENCOUNTER — Other Ambulatory Visit: Payer: Self-pay

## 2019-05-11 ENCOUNTER — Inpatient Hospital Stay: Payer: Medicare PPO

## 2019-05-11 DIAGNOSIS — C9000 Multiple myeloma not having achieved remission: Secondary | ICD-10-CM

## 2019-05-11 DIAGNOSIS — Z5111 Encounter for antineoplastic chemotherapy: Secondary | ICD-10-CM | POA: Diagnosis not present

## 2019-05-11 LAB — CBC WITH DIFFERENTIAL (CANCER CENTER ONLY)
Abs Immature Granulocytes: 0.02 10*3/uL (ref 0.00–0.07)
Basophils Absolute: 0 10*3/uL (ref 0.0–0.1)
Basophils Relative: 1 %
Eosinophils Absolute: 0 10*3/uL (ref 0.0–0.5)
Eosinophils Relative: 2 %
HCT: 43.8 % (ref 39.0–52.0)
Hemoglobin: 14.2 g/dL (ref 13.0–17.0)
Immature Granulocytes: 1 %
Lymphocytes Relative: 36 %
Lymphs Abs: 0.8 10*3/uL (ref 0.7–4.0)
MCH: 28.4 pg (ref 26.0–34.0)
MCHC: 32.4 g/dL (ref 30.0–36.0)
MCV: 87.6 fL (ref 80.0–100.0)
Monocytes Absolute: 0.4 10*3/uL (ref 0.1–1.0)
Monocytes Relative: 18 %
Neutro Abs: 1 10*3/uL — ABNORMAL LOW (ref 1.7–7.7)
Neutrophils Relative %: 42 %
Platelet Count: 47 10*3/uL — ABNORMAL LOW (ref 150–400)
RBC: 5 MIL/uL (ref 4.22–5.81)
RDW: 16.3 % — ABNORMAL HIGH (ref 11.5–15.5)
WBC Count: 2.3 10*3/uL — ABNORMAL LOW (ref 4.0–10.5)
nRBC: 0 % (ref 0.0–0.2)

## 2019-05-11 LAB — CMP (CANCER CENTER ONLY)
ALT: 11 U/L (ref 0–44)
AST: 12 U/L — ABNORMAL LOW (ref 15–41)
Albumin: 3 g/dL — ABNORMAL LOW (ref 3.5–5.0)
Alkaline Phosphatase: 50 U/L (ref 38–126)
Anion gap: 8 (ref 5–15)
BUN: 11 mg/dL (ref 8–23)
CO2: 24 mmol/L (ref 22–32)
Calcium: 8.8 mg/dL — ABNORMAL LOW (ref 8.9–10.3)
Chloride: 108 mmol/L (ref 98–111)
Creatinine: 0.72 mg/dL (ref 0.61–1.24)
GFR, Est AFR Am: >60 mL/min (ref >60)
GFR, Estimated: >60 mL/min (ref >60)
Glucose, Bld: 122 mg/dL — ABNORMAL HIGH (ref 70–99)
Potassium: 3.8 mmol/L (ref 3.5–5.1)
Sodium: 140 mmol/L (ref 135–145)
Total Bilirubin: 0.6 mg/dL (ref 0.3–1.2)
Total Protein: 5.6 g/dL — ABNORMAL LOW (ref 6.5–8.1)

## 2019-05-11 MED ORDER — MONTELUKAST SODIUM 10 MG PO TABS
ORAL_TABLET | ORAL | Status: AC
  Filled 2019-05-11: qty 1

## 2019-05-11 MED ORDER — DIPHENHYDRAMINE HCL 25 MG PO CAPS
ORAL_CAPSULE | ORAL | Status: AC
  Filled 2019-05-11: qty 2

## 2019-05-11 MED ORDER — ACETAMINOPHEN 325 MG PO TABS
ORAL_TABLET | ORAL | Status: AC
  Filled 2019-05-11: qty 2

## 2019-05-12 NOTE — Progress Notes (Signed)
Pharmacist Chemotherapy Monitoring - Follow Up Assessment    I verify that I have reviewed each item in the below checklist:  . Regimen for the patient is scheduled for the appropriate day and plan matches scheduled date. Marland Kitchen Appropriate non-routine labs are ordered dependent on drug ordered. . If applicable, additional medications reviewed and ordered per protocol based on lifetime cumulative doses and/or treatment regimen.   Plan for follow-up and/or issues identified: Yes . I-vent associated with next due treatment: Yes . MD and/or nursing notified: Hoy Register 05/12/2019 3:54 PM

## 2019-05-14 NOTE — Progress Notes (Signed)
  Radiation Oncology         [phone] ________________________________  Name: IVEN SOSCIA MRN: [id number]  Date: 05/09/2019  DOB: 04/21/1934  End of Treatment Note  Diagnosis:   85 y.o. patient with lumbar 5 and right chest wall myeloma     Indication for treatment:  Palliation       Radiation treatment dates:   3/29-4/12/21  Site/dose:    1.  The L5 lesion was treated to 20 Gy in 10 fractions of 2 Gy 2.  The right lateral rib lesion was treated to 20 Gy in 10 fractions of 2 Gy  Beams/energy:    1.  The L5 lesion was treated using static zero and 180 degree gantry angles with 3D planning and conformal MLC to avoid kidneys.  6X beams 2.  The right lateral rib lesion was treated using a 3-field beam arrangement with tangents and an en face field with conformal MLC to avoid lungs and liver.  6X beams  Narrative: The patient tolerated radiation treatment relatively well.   His pain improved some and his rib lesion decreased.  Plan: The patient has completed radiation treatment. The patient will return to radiation oncology clinic for routine followup in one month. I advised him to call or return sooner if he has any questions or concerns related to his recovery or treatment. ________________________________  Sheral Apley. Tammi Klippel, M.D.

## 2019-05-15 ENCOUNTER — Other Ambulatory Visit: Payer: Self-pay | Admitting: Oncology

## 2019-05-18 ENCOUNTER — Other Ambulatory Visit: Payer: Self-pay | Admitting: Oncology

## 2019-05-18 ENCOUNTER — Inpatient Hospital Stay: Payer: Medicare PPO

## 2019-05-18 ENCOUNTER — Encounter: Payer: Self-pay | Admitting: Nurse Practitioner

## 2019-05-18 ENCOUNTER — Inpatient Hospital Stay (HOSPITAL_BASED_OUTPATIENT_CLINIC_OR_DEPARTMENT_OTHER): Payer: Medicare PPO | Admitting: Nurse Practitioner

## 2019-05-18 ENCOUNTER — Other Ambulatory Visit: Payer: Self-pay

## 2019-05-18 VITALS — BP 116/69 | HR 82 | Temp 98.2°F | Resp 16 | Ht 71.0 in | Wt 181.3 lb

## 2019-05-18 DIAGNOSIS — C9000 Multiple myeloma not having achieved remission: Secondary | ICD-10-CM

## 2019-05-18 DIAGNOSIS — Z5111 Encounter for antineoplastic chemotherapy: Secondary | ICD-10-CM | POA: Diagnosis not present

## 2019-05-18 LAB — CBC WITH DIFFERENTIAL (CANCER CENTER ONLY)
Abs Immature Granulocytes: 0.01 10*3/uL (ref 0.00–0.07)
Basophils Absolute: 0.1 10*3/uL (ref 0.0–0.1)
Basophils Relative: 1 %
Eosinophils Absolute: 0 10*3/uL (ref 0.0–0.5)
Eosinophils Relative: 1 %
HCT: 43.5 % (ref 39.0–52.0)
Hemoglobin: 14 g/dL (ref 13.0–17.0)
Immature Granulocytes: 0 %
Lymphocytes Relative: 44 %
Lymphs Abs: 2.3 10*3/uL (ref 0.7–4.0)
MCH: 28.3 pg (ref 26.0–34.0)
MCHC: 32.2 g/dL (ref 30.0–36.0)
MCV: 87.9 fL (ref 80.0–100.0)
Monocytes Absolute: 0.5 10*3/uL (ref 0.1–1.0)
Monocytes Relative: 11 %
Neutro Abs: 2.2 10*3/uL (ref 1.7–7.7)
Neutrophils Relative %: 43 %
Platelet Count: 89 10*3/uL — ABNORMAL LOW (ref 150–400)
RBC: 4.95 MIL/uL (ref 4.22–5.81)
RDW: 16.5 % — ABNORMAL HIGH (ref 11.5–15.5)
WBC Count: 5.1 10*3/uL (ref 4.0–10.5)
nRBC: 0 % (ref 0.0–0.2)

## 2019-05-18 LAB — CMP (CANCER CENTER ONLY)
ALT: 15 U/L (ref 0–44)
AST: 18 U/L (ref 15–41)
Albumin: 3 g/dL — ABNORMAL LOW (ref 3.5–5.0)
Alkaline Phosphatase: 50 U/L (ref 38–126)
Anion gap: 11 (ref 5–15)
BUN: 9 mg/dL (ref 8–23)
CO2: 24 mmol/L (ref 22–32)
Calcium: 8.4 mg/dL — ABNORMAL LOW (ref 8.9–10.3)
Chloride: 107 mmol/L (ref 98–111)
Creatinine: 0.7 mg/dL (ref 0.61–1.24)
GFR, Est AFR Am: >60 mL/min (ref >60)
GFR, Estimated: >60 mL/min (ref >60)
Glucose, Bld: 111 mg/dL — ABNORMAL HIGH (ref 70–99)
Potassium: 3.8 mmol/L (ref 3.5–5.1)
Sodium: 142 mmol/L (ref 135–145)
Total Bilirubin: 0.5 mg/dL (ref 0.3–1.2)
Total Protein: 5.5 g/dL — ABNORMAL LOW (ref 6.5–8.1)

## 2019-05-18 MED ORDER — SODIUM CHLORIDE 0.9 % IV SOLN
INTRAVENOUS | Status: DC
  Filled 2019-05-18: qty 250

## 2019-05-18 MED ORDER — DEXAMETHASONE 4 MG PO TABS
ORAL_TABLET | ORAL | Status: AC
  Filled 2019-05-18: qty 5

## 2019-05-18 MED ORDER — DARATUMUMAB-HYALURONIDASE-FIHJ 1800-30000 MG-UT/15ML ~~LOC~~ SOLN
1800.0000 mg | Freq: Once | SUBCUTANEOUS | Status: AC
  Administered 2019-05-18: 1800 mg via SUBCUTANEOUS
  Filled 2019-05-18: qty 15

## 2019-05-18 MED ORDER — DIPHENHYDRAMINE HCL 25 MG PO CAPS
50.0000 mg | ORAL_CAPSULE | Freq: Once | ORAL | Status: AC
  Administered 2019-05-18: 14:00:00 50 mg via ORAL

## 2019-05-18 MED ORDER — ACETAMINOPHEN 325 MG PO TABS
650.0000 mg | ORAL_TABLET | Freq: Once | ORAL | Status: AC
  Administered 2019-05-18: 650 mg via ORAL

## 2019-05-18 MED ORDER — DIPHENHYDRAMINE HCL 25 MG PO CAPS
ORAL_CAPSULE | ORAL | Status: AC
  Filled 2019-05-18: qty 2

## 2019-05-18 MED ORDER — DEXTROSE 5 % IV SOLN
58.0000 mg/m2 | Freq: Once | INTRAVENOUS | Status: AC
  Administered 2019-05-18: 15:00:00 120 mg via INTRAVENOUS
  Filled 2019-05-18: qty 60

## 2019-05-18 MED ORDER — DEXAMETHASONE 4 MG PO TABS
20.0000 mg | ORAL_TABLET | Freq: Once | ORAL | Status: AC
  Administered 2019-05-18: 14:00:00 20 mg via ORAL

## 2019-05-18 MED ORDER — MONTELUKAST SODIUM 10 MG PO TABS
10.0000 mg | ORAL_TABLET | Freq: Once | ORAL | Status: AC
  Administered 2019-05-18: 10 mg via ORAL

## 2019-05-18 MED ORDER — ACETAMINOPHEN 325 MG PO TABS
ORAL_TABLET | ORAL | Status: AC
  Filled 2019-05-18: qty 2

## 2019-05-18 NOTE — Patient Instructions (Signed)
[facility] Discharge Instructions for Patients Receiving Chemotherapy  Today you received the following chemotherapy agents: carfilzomib and daratumumab.  To help prevent nausea and vomiting after your treatment, we encourage you to take your nausea medication as directed.   If you develop nausea and vomiting that is not controlled by your nausea medication, call the clinic.   BELOW ARE SYMPTOMS THAT SHOULD BE REPORTED IMMEDIATELY:  *FEVER GREATER THAN 100.5 F  *CHILLS WITH OR WITHOUT FEVER  NAUSEA AND VOMITING THAT IS NOT CONTROLLED WITH YOUR NAUSEA MEDICATION  *UNUSUAL SHORTNESS OF BREATH  *UNUSUAL BRUISING OR BLEEDING  TENDERNESS IN MOUTH AND THROAT WITH OR WITHOUT PRESENCE OF ULCERS  *URINARY PROBLEMS  *BOWEL PROBLEMS  UNUSUAL RASH Items with * indicate a potential emergency and should be followed up as soon as possible.  Feel free to call the clinic should you have any questions or concerns. The clinic phone number is [phone].  Please show the CHEMO ALERT CARD at check-in to the Emergency Department and triage nurse.   

## 2019-05-18 NOTE — Progress Notes (Addendum)
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  Cycle 2-day 8 carfilzomib/daratumumab held due to neutropenia and thrombocytopenia.  He notes some improvement in his energy level over the past several days.  No nausea or vomiting.  Bowels tend alternate diarrhea and constipation.  Chest wall pain is better.  No shortness of breath or wheezing.  Objective:  Vital signs in last 24 hours:  Blood pressure 116/69, pulse 82, temperature 98.2 F (36.8 C), temperature source Temporal, resp. rate 16, height '5\' 11"'  (1.803 m), weight 181 lb 4.8 oz (82.2 kg), SpO2 97 %.    HEENT: No thrush or ulcers. Resp: Lungs clear bilaterally. Cardio: Regular rate and rhythm. GI: No hepatosplenomegaly. Vascular: Trace edema at the lower legs bilaterally left slightly greater than right. Musculoskeletal: Near resolution of right chest wall mass.   Lab Results:  Lab Results  Component Value Date   WBC 5.1 05/18/2019   HGB 14.0 05/18/2019   HCT 43.5 05/18/2019   MCV 87.9 05/18/2019   PLT 89 (L) 05/18/2019   NEUTROABS 2.2 05/18/2019    Imaging:  No results found.  Medications: I have reviewed the patient's current medications.  Assessment/Plan: 1. Multiple myeloma-confirmed on a bone marrow biopsy 08/07/2014, IgA lambda  Serum M spike and increased serum free lambda light chains  Myeloma FISH panel negative for chromosome 4, 11, 12, 13, 14, and 17 abnormalities, cytogenetics with no metaphases  Bone survey 08/15/2014 with indeterminant lucent skull lesions  Cycle 1 RVD 08/22/2014  Cycle 2 RVD 09/19/2014  Cycle 3 RVD 10/17/2014  Cycle 4 RVD 11/14/2014 (Decadron reduced to 20 mg weekly, Revlimid 15 mg days 1 through 14, Velcade 3/4 weeks)  Cycle 5 RVD 12/12/2014  Serum M spike not detected 12/26/2014  Cycle 6 RVD 01/09/2015  Maintenance Revlimid, 10 mg daily, 02/05/2015 , discontinue 02/26/2015  secondary to leg edema and diarrhea  Revlimid resumed at a dose of 10 mg every other day beginning 03/13/2015  PET scan 32/44/0102-VO hypermetabolic bone lesions, few lucent lesions in the spine and pelvis  Revlimid discontinued January 2019  Enrollment on a clinical trial at [facility] with pomalidomide/Decadron+/- ixazomibbeginning 03/10/2017  Treatment discontinued February 2021 secondary to rising serum lambda light chains  PET 04/05/2019-hypermetabolic right axillary/chest wall nodes, right paravertebral mass, small right pleural effusion, retrocrural node, right lateral abdominal wall mass, L5 lesion mild compression deformity. Focus of hypermetabolism at the anterior left pelvic wall without a CT correlate. Hypermetabolic lytic lesion in the left T3 transverse process  Cycle 1 daratumumab/carfilzomib/Decadron 04/05/2019  Radiation to the right chest wall mass and lumbar spine beginning 04/25/2019  Cycle 2 daratumumab/carfilzomib/Decadron 05/04/2019 (carfilzomib dose escalated); day 8 held due to neutropenia, thrombocytopenia; day 15 05/18/2019 (carfilzomib dose reduced)  2. 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 History of intermittent prostatitis  4. Skin rash 10/24/2014-referred to dermatology, biopsy consistent with local hypersensitivity reaction  5. 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 Left lower leg cellulitis 06/24/2015-treated with Keflex  7. History of mild thrombocytopenia secondary tomyeloma and systemic therapy  8. History of mild neutropenia-secondary to myeloma and systemic therapy  9. Fall with a skull fracture and subarachnoid blood/right frontal lobe contusions 10/17/2015  10.  Right lower lobe pulmonary embolus 03/28/2016.  Lovenox , transitioned to Xarelto  11. Vesicular rashJune 2018-potentially related to the zoster vaccine, resolved  12. History of atrial fibrillation  13. 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  He will complete cycle 2-day 15 carfilzomib/daratumumab/Decadron today.  Carfilzomib dose reduced due to previous neutropenia/thrombocytopenia.  He will return in 1 week for daratumumab.  We reviewed the CBC from today.  Blood counts are adequate to proceed with treatment.  We will return for lab, follow-up, carfilzomib/daratumumab in 3 weeks.  Patient seen with Mitchell Lowery.    Mitchell Lowery ANP/GNP-BC   05/18/2019  12:23 PM This was a shared visit with Mitchell Lowery.  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  The right chest wall mass has resolved.  His pain has improved.  The myeloma markers have improved.  The plan is to continue treatment on the current regimen.  Neutropenia and thrombocytopenia have improved.  I decreased the carfilzomib dose.  Mitchell Manson, MD

## 2019-05-19 ENCOUNTER — Telehealth: Payer: Self-pay | Admitting: Oncology

## 2019-05-19 NOTE — Progress Notes (Signed)
Pharmacist Chemotherapy Monitoring - Follow Up Assessment    I verify that I have reviewed each item in the below checklist:  . Regimen for the patient is scheduled for the appropriate day and plan matches scheduled date. Marland Kitchen Appropriate non-routine labs are ordered dependent on drug ordered. . If applicable, additional medications reviewed and ordered per protocol based on lifetime cumulative doses and/or treatment regimen.   Plan for follow-up and/or issues identified: Yes . I-vent associated with next due treatment: Yes . MD and/or nursing notified: Yes  Philomena Course 05/19/2019 2:44 PM

## 2019-05-19 NOTE — Telephone Encounter (Signed)
Scheduled per los. Called and spoke with patient. Confirmed appt 

## 2019-05-25 ENCOUNTER — Inpatient Hospital Stay: Payer: Medicare PPO

## 2019-05-25 ENCOUNTER — Other Ambulatory Visit: Payer: Self-pay

## 2019-05-25 ENCOUNTER — Telehealth: Payer: Self-pay | Admitting: Nurse Practitioner

## 2019-05-25 ENCOUNTER — Other Ambulatory Visit: Payer: Self-pay | Admitting: *Deleted

## 2019-05-25 DIAGNOSIS — C9000 Multiple myeloma not having achieved remission: Secondary | ICD-10-CM

## 2019-05-25 DIAGNOSIS — Z5111 Encounter for antineoplastic chemotherapy: Secondary | ICD-10-CM | POA: Diagnosis not present

## 2019-05-25 LAB — CMP (CANCER CENTER ONLY)
ALT: 22 U/L (ref 0–44)
AST: 25 U/L (ref 15–41)
Albumin: 2.9 g/dL — ABNORMAL LOW (ref 3.5–5.0)
Alkaline Phosphatase: 44 U/L (ref 38–126)
Anion gap: 9 (ref 5–15)
BUN: 10 mg/dL (ref 8–23)
CO2: 25 mmol/L (ref 22–32)
Calcium: 8.7 mg/dL — ABNORMAL LOW (ref 8.9–10.3)
Chloride: 109 mmol/L (ref 98–111)
Creatinine: 0.74 mg/dL (ref 0.61–1.24)
GFR, Est AFR Am: >60 mL/min (ref >60)
GFR, Estimated: >60 mL/min (ref >60)
Glucose, Bld: 119 mg/dL — ABNORMAL HIGH (ref 70–99)
Potassium: 3.8 mmol/L (ref 3.5–5.1)
Sodium: 143 mmol/L (ref 135–145)
Total Bilirubin: 0.8 mg/dL (ref 0.3–1.2)
Total Protein: 5.3 g/dL — ABNORMAL LOW (ref 6.5–8.1)

## 2019-05-25 LAB — CBC WITH DIFFERENTIAL (CANCER CENTER ONLY)
Abs Immature Granulocytes: 0.01 10*3/uL (ref 0.00–0.07)
Basophils Absolute: 0 10*3/uL (ref 0.0–0.1)
Basophils Relative: 0 %
Eosinophils Absolute: 0.1 10*3/uL (ref 0.0–0.5)
Eosinophils Relative: 2 %
HCT: 40.3 % (ref 39.0–52.0)
Hemoglobin: 13.2 g/dL (ref 13.0–17.0)
Immature Granulocytes: 0 %
Lymphocytes Relative: 46 %
Lymphs Abs: 1.5 10*3/uL (ref 0.7–4.0)
MCH: 28.4 pg (ref 26.0–34.0)
MCHC: 32.8 g/dL (ref 30.0–36.0)
MCV: 86.9 fL (ref 80.0–100.0)
Monocytes Absolute: 0.7 10*3/uL (ref 0.1–1.0)
Monocytes Relative: 20 %
Neutro Abs: 1.1 10*3/uL — ABNORMAL LOW (ref 1.7–7.7)
Neutrophils Relative %: 32 %
Platelet Count: 51 10*3/uL — ABNORMAL LOW (ref 150–400)
RBC: 4.64 MIL/uL (ref 4.22–5.81)
RDW: 16.2 % — ABNORMAL HIGH (ref 11.5–15.5)
WBC Count: 3.4 10*3/uL — ABNORMAL LOW (ref 4.0–10.5)
nRBC: 0 % (ref 0.0–0.2)

## 2019-05-25 MED FILL — XARELTO 20 MG TABLET: 20 | 30 days supply | Qty: 30 | Fill #2

## 2019-05-25 NOTE — Telephone Encounter (Signed)
Scheduled appt per 4/28 sch message - unable to reach pt . Left message with appt date and time

## 2019-05-25 NOTE — Progress Notes (Signed)
Per [name], no treatment today d/t low platelets and low ANC. [name] came to treatment room to discuss with patient.

## 2019-05-26 ENCOUNTER — Telehealth: Payer: Self-pay | Admitting: Nurse Practitioner

## 2019-05-26 LAB — KAPPA/LAMBDA LIGHT CHAINS
Kappa free light chain: 3 mg/L — ABNORMAL LOW (ref 3.3–19.4)
Kappa, lambda light chain ratio: 0.63 (ref 0.26–1.65)
Lambda free light chains: 4.8 mg/L — ABNORMAL LOW (ref 5.7–26.3)

## 2019-05-26 LAB — IGA: IgA: 9 mg/dL — ABNORMAL LOW (ref 61–437)

## 2019-05-26 NOTE — Telephone Encounter (Signed)
Rescheduled appt due to LT/ GBS meeting - left message for patient with new appt on 05/31/2019

## 2019-05-26 NOTE — Progress Notes (Signed)
Pharmacist Chemotherapy Monitoring - Follow Up Assessment    I verify that I have reviewed each item in the below checklist:  . Regimen for the patient is scheduled for the appropriate day and plan matches scheduled date. Marland Kitchen Appropriate non-routine labs are ordered dependent on drug ordered. . If applicable, additional medications reviewed and ordered per protocol based on lifetime cumulative doses and/or treatment regimen.   Plan for follow-up and/or issues identified: No . I-vent associated with next due treatment: No . MD and/or nursing notified: No  Mitchell Lowery 05/26/2019 12:42 PM

## 2019-05-29 ENCOUNTER — Other Ambulatory Visit: Payer: Self-pay | Admitting: Oncology

## 2019-05-31 ENCOUNTER — Inpatient Hospital Stay (HOSPITAL_BASED_OUTPATIENT_CLINIC_OR_DEPARTMENT_OTHER): Payer: Medicare PPO | Admitting: Oncology

## 2019-05-31 ENCOUNTER — Other Ambulatory Visit: Payer: Self-pay

## 2019-05-31 ENCOUNTER — Inpatient Hospital Stay: Payer: Medicare PPO | Attending: Oncology

## 2019-05-31 ENCOUNTER — Encounter: Payer: Self-pay | Admitting: Oncology

## 2019-05-31 ENCOUNTER — Inpatient Hospital Stay: Payer: Medicare PPO

## 2019-05-31 VITALS — BP 112/66 | HR 76 | Temp 98.0°F | Resp 17 | Ht 71.0 in | Wt 179.4 lb

## 2019-05-31 DIAGNOSIS — D696 Thrombocytopenia, unspecified: Secondary | ICD-10-CM | POA: Insufficient documentation

## 2019-05-31 DIAGNOSIS — Z5111 Encounter for antineoplastic chemotherapy: Secondary | ICD-10-CM | POA: Diagnosis present

## 2019-05-31 DIAGNOSIS — C9000 Multiple myeloma not having achieved remission: Secondary | ICD-10-CM

## 2019-05-31 DIAGNOSIS — Z5112 Encounter for antineoplastic immunotherapy: Secondary | ICD-10-CM | POA: Insufficient documentation

## 2019-05-31 LAB — CBC WITH DIFFERENTIAL (CANCER CENTER ONLY)
Abs Immature Granulocytes: 0.01 10*3/uL (ref 0.00–0.07)
Basophils Absolute: 0 10*3/uL (ref 0.0–0.1)
Basophils Relative: 1 %
Eosinophils Absolute: 0.2 10*3/uL (ref 0.0–0.5)
Eosinophils Relative: 4 %
HCT: 41.9 % (ref 39.0–52.0)
Hemoglobin: 13.7 g/dL (ref 13.0–17.0)
Immature Granulocytes: 0 %
Lymphocytes Relative: 46 %
Lymphs Abs: 2.2 10*3/uL (ref 0.7–4.0)
MCH: 29.1 pg (ref 26.0–34.0)
MCHC: 32.7 g/dL (ref 30.0–36.0)
MCV: 89.1 fL (ref 80.0–100.0)
Monocytes Absolute: 0.6 10*3/uL (ref 0.1–1.0)
Monocytes Relative: 12 %
Neutro Abs: 1.7 10*3/uL (ref 1.7–7.7)
Neutrophils Relative %: 37 %
Platelet Count: 100 10*3/uL — ABNORMAL LOW (ref 150–400)
RBC: 4.7 MIL/uL (ref 4.22–5.81)
RDW: 16.5 % — ABNORMAL HIGH (ref 11.5–15.5)
WBC Count: 4.7 10*3/uL (ref 4.0–10.5)
nRBC: 0 % (ref 0.0–0.2)

## 2019-05-31 MED ORDER — DEXTROSE 5 % IV SOLN
58.0000 mg/m2 | Freq: Once | INTRAVENOUS | Status: AC
  Administered 2019-05-31: 120 mg via INTRAVENOUS
  Filled 2019-05-31: qty 60

## 2019-05-31 MED ORDER — DEXAMETHASONE 4 MG PO TABS
20.0000 mg | ORAL_TABLET | Freq: Once | ORAL | Status: AC
  Administered 2019-05-31: 20 mg via ORAL

## 2019-05-31 MED ORDER — ACETAMINOPHEN 325 MG PO TABS
ORAL_TABLET | ORAL | Status: AC
  Filled 2019-05-31: qty 2

## 2019-05-31 MED ORDER — DARATUMUMAB-HYALURONIDASE-FIHJ 1800-30000 MG-UT/15ML ~~LOC~~ SOLN
1800.0000 mg | Freq: Once | SUBCUTANEOUS | Status: AC
  Administered 2019-05-31: 1800 mg via SUBCUTANEOUS
  Filled 2019-05-31: qty 15

## 2019-05-31 MED ORDER — MONTELUKAST SODIUM 10 MG PO TABS
ORAL_TABLET | ORAL | Status: AC
  Filled 2019-05-31: qty 1

## 2019-05-31 MED ORDER — SODIUM CHLORIDE 0.9 % IV SOLN
Freq: Once | INTRAVENOUS | Status: AC
  Filled 2019-05-31: qty 250

## 2019-05-31 MED ORDER — DIPHENHYDRAMINE HCL 25 MG PO CAPS
50.0000 mg | ORAL_CAPSULE | Freq: Once | ORAL | Status: AC
  Administered 2019-05-31: 50 mg via ORAL

## 2019-05-31 MED ORDER — SODIUM CHLORIDE 0.9 % IV SOLN
Freq: Once | INTRAVENOUS | Status: DC
  Filled 2019-05-31: qty 250

## 2019-05-31 MED ORDER — MONTELUKAST SODIUM 10 MG PO TABS
10.0000 mg | ORAL_TABLET | Freq: Once | ORAL | Status: AC
  Administered 2019-05-31: 10 mg via ORAL

## 2019-05-31 MED ORDER — DEXAMETHASONE 4 MG PO TABS
ORAL_TABLET | ORAL | Status: AC
  Filled 2019-05-31: qty 5

## 2019-05-31 MED ORDER — DIPHENHYDRAMINE HCL 25 MG PO CAPS
ORAL_CAPSULE | ORAL | Status: AC
  Filled 2019-05-31: qty 2

## 2019-05-31 MED ORDER — ACETAMINOPHEN 325 MG PO TABS
650.0000 mg | ORAL_TABLET | Freq: Once | ORAL | Status: AC
  Administered 2019-05-31: 650 mg via ORAL

## 2019-05-31 NOTE — Patient Instructions (Addendum)
[place] Discharge Instructions for Patients Receiving Chemotherapy  Today you received the following chemotherapy agents: Kyprolis and Darzalex Faspro.  To help prevent nausea and vomiting after your treatment, we encourage you to take your nausea medication as directed.   If you develop nausea and vomiting that is not controlled by your nausea medication, call the clinic.   BELOW ARE SYMPTOMS THAT SHOULD BE REPORTED IMMEDIATELY:  *FEVER GREATER THAN 100.5 F  *CHILLS WITH OR WITHOUT FEVER  NAUSEA AND VOMITING THAT IS NOT CONTROLLED WITH YOUR NAUSEA MEDICATION  *UNUSUAL SHORTNESS OF BREATH  *UNUSUAL BRUISING OR BLEEDING  TENDERNESS IN MOUTH AND THROAT WITH OR WITHOUT PRESENCE OF ULCERS  *URINARY PROBLEMS  *BOWEL PROBLEMS  UNUSUAL RASH Items with * indicate a potential emergency and should be followed up as soon as possible.  Feel free to call the clinic should you have any questions or concerns. The clinic phone number is [phone].  Please show the [place] at check-in to the Emergency Department and triage nurse.

## 2019-05-31 NOTE — Progress Notes (Signed)
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  Treatment was held on 05/25/2019 due to neutropenia and thrombocytopenia.  Pain at the right chest wall and lower back has resolved.  He is walking approximately 1.5 miles daily.  He has mild exertional dyspnea that resolves after resting and does not return when he resumes walking.  No bleeding.  He has a mild rash at the left palm.  Objective:  Vital signs in last 24 hours:  Blood pressure 112/66, pulse 76, temperature 98 F (36.7 C), temperature source Temporal, resp. rate 17, height '5\' 11"'  (1.803 m), weight 179 lb 6.4 oz (81.4 kg), SpO2 96 %.     GI: Nontender, no hepatosplenomegaly Vascular: Trace lower leg edema bilaterally  Skin: Few erythematous slightly raised 1-2 mm lesions at the left palm Musculoskeletal: No mass at the right chest wall, no tenderness at the lower back   Lab Results:  Lab Results  Component Value Date   WBC 4.7 05/31/2019   HGB 13.7 05/31/2019   HCT 41.9 05/31/2019   MCV 89.1 05/31/2019   PLT 100 (L) 05/31/2019   NEUTROABS 1.7 05/31/2019    CMP  Lab Results  Component Value Date   NA 143 05/25/2019   K 3.8 05/25/2019   CL 109 05/25/2019   CO2 25 05/25/2019   GLUCOSE 119 (H) 05/25/2019   BUN 10 05/25/2019   CREATININE 0.74 05/25/2019   CALCIUM 8.7 (L) 05/25/2019   PROT 5.3 (L) 05/25/2019   ALBUMIN 2.9 (L) 05/25/2019   AST 25 05/25/2019   ALT 22 05/25/2019   ALKPHOS 44 05/25/2019   BILITOT 0.8 05/25/2019   GFRNONAA >60 05/25/2019   GFRAA >60 05/25/2019   05/25/2019: IgA 9, lambda free light chains 4.8  Medications: I have reviewed the patient's current medications.   Assessment/Plan: 1. Multiple myeloma-confirmed on a bone marrow biopsy 08/07/2014, IgA lambda  Serum M spike and increased serum free lambda light chains  Myeloma FISH panel  negative for chromosome 4, 11, 12, 13, 14, and 17 abnormalities, cytogenetics with no metaphases  Bone survey 08/15/2014 with indeterminant lucent skull lesions  Cycle 1 RVD 08/22/2014  Cycle 2 RVD 09/19/2014  Cycle 3 RVD 10/17/2014  Cycle 4 RVD 11/14/2014 (Decadron reduced to 20 mg weekly, Revlimid 15 mg days 1 through 14, Velcade 3/4 weeks)  Cycle 5 RVD 12/12/2014  Serum M spike not detected 12/26/2014  Cycle 6 RVD 01/09/2015  Maintenance Revlimid, 10 mg daily, 02/05/2015 , discontinue 02/26/2015 secondary to leg edema and diarrhea  Revlimid resumed at a dose of 10 mg every other day beginning 03/13/2015  PET scan 33/29/5188-CZ hypermetabolic bone lesions, few lucent lesions in the spine and pelvis  Revlimid discontinued January 2019  Enrollment on a clinical trial at [facility] with pomalidomide/Decadron+/- ixazomibbeginning 03/10/2017  Treatment discontinued February 2021 secondary to rising serum lambda light chains  PET 04/05/2019-hypermetabolic right axillary/chest wall nodes, right paravertebral mass, small right pleural effusion, retrocrural node, right lateral abdominal wall mass, L5 lesion mild compression deformity. Focus of hypermetabolism at the anterior left pelvic wall without a CT correlate. Hypermetabolic lytic lesion in the left T3 transverse process  Cycle 1 daratumumab/carfilzomib/Decadron 04/05/2019  Radiation to the right chest wall mass and lumbar spine beginning 04/25/2019  Cycle 2 daratumumab/carfilzomib/Decadron 05/04/2019 (carfilzomib dose escalated); day 8 held due to neutropenia, thrombocytopenia; day 15 05/18/2019 (carfilzomib dose reduced)  Daratumumab/carfilzomib/Decadron changed to  every 2-week dosing beginning 05/25/2019  2. 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 History of  intermittent prostatitis  4. Skin rash 10/24/2014-referred to dermatology, biopsy consistent with local hypersensitivity reaction  5. 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 Left lower leg cellulitis 06/24/2015-treated with Keflex  7. History of mild thrombocytopenia secondary tomyeloma and systemic therapy  8. History of mild neutropenia-secondary to myeloma and systemic therapy  9. Fall with a skull fracture and subarachnoid blood/right frontal lobe contusions 10/17/2015  10. Right lower lobe pulmonary embolus 03/28/2016. Lovenox , transitioned to Xarelto  11. Vesicular rashJune 2018-potentially related to the zoster vaccine, resolved  12. History of atrial fibrillation  13. 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  He is tolerating the carfilzomib and daratumumab well and there has been clinical and laboratory improvement in the multiple myeloma.  The plan is to continue carfilzomib every 2 weeks.  He will receive Decadron on the days of carfilzomib therapy.  He will continue every 2-week daratumumab for a few more treatments and then switch to monthly dosing.  [name] will return for an office visit in the next treatment with carfilzomib/daratumumab on 06/15/2019.  [name], MD  05/31/2019  8:53 AM

## 2019-06-01 ENCOUNTER — Ambulatory Visit: Payer: Medicare PPO | Admitting: Nurse Practitioner

## 2019-06-01 ENCOUNTER — Telehealth: Payer: Self-pay | Admitting: Oncology

## 2019-06-01 ENCOUNTER — Other Ambulatory Visit: Payer: Medicare PPO

## 2019-06-01 ENCOUNTER — Ambulatory Visit: Payer: Medicare PPO

## 2019-06-01 NOTE — Telephone Encounter (Signed)
Scheduled per los. Called and spoke with patient. Confirmed appt 

## 2019-06-07 ENCOUNTER — Telehealth: Payer: Self-pay

## 2019-06-07 NOTE — Telephone Encounter (Signed)
Appointment on 06/08/19 patient verbalized he understood this is a telephone encounter

## 2019-06-08 ENCOUNTER — Ambulatory Visit: Payer: Medicare PPO | Admitting: Oncology

## 2019-06-08 ENCOUNTER — Other Ambulatory Visit: Payer: Medicare PPO

## 2019-06-08 ENCOUNTER — Other Ambulatory Visit: Payer: Self-pay

## 2019-06-08 ENCOUNTER — Ambulatory Visit
Admission: RE | Admit: 2019-06-08 | Discharge: 2019-06-08 | Disposition: A | Discharge status: Home / Self Care | Payer: Medicare PPO | Source: Ambulatory Visit | Attending: Urology | Admitting: Urology

## 2019-06-08 ENCOUNTER — Encounter: Payer: Self-pay | Admitting: Urology

## 2019-06-08 ENCOUNTER — Ambulatory Visit: Payer: Medicare PPO

## 2019-06-08 DIAGNOSIS — C9002 Multiple myeloma in relapse: Secondary | ICD-10-CM

## 2019-06-08 NOTE — Progress Notes (Signed)
Radiation Oncology         [phone] ________________________________  Name: Mitchell Lowery MRN: [id number]  Date: 06/08/2019  DOB: 03-06-1934  Post Treatment Note  CC: [name], MD  [name], MD  Diagnosis:   85 y.o. male with painful disease in L5 and right lateral ribs secondary to relapse of multiple myeloma.  Interval Since Last Radiation:  4 weeks  3/29-4/12/21:   1.  The L5 lesion was treated to 20 Gy in 10 fractions of 2 Gy 2.  The right lateral rib lesion was treated to 20 Gy in 10 fractions of 2 Gy  05/17/2014-06/21/2014:The prostate, seminal vesicles and pelvic lymph nodes were treated to 45 Gy in 25 fractions of 1.8 Gy  07/21/2014:  Insertion of radioactive I-125 seeds into the prostate gland; 110 Gy, boost therapy.(Manning/Dahlststedt)  Narrative:  I spoke with the patient to conduct his routine scheduled 1 month follow up visit via telephone to spare the patient unnecessary potential exposure in the healthcare setting during the current COVID-19 pandemic.  The patient was notified in advance and gave permission to proceed with this visit format.   He tolerated radiation treatment relatively well with improved pain and decreased size of rib lesion noted at completion of treatment.       He continues on systemic therapy under the care and direction of [name] and has completed 3 cycles of carfilzomib/daratumumab as of 05/31/19 with plans for his next dose on 06/15/19. The current plan is to continue treatments q 2 weeks for several more cycles and then transition to monthly dosing. So far, he is tolerating systemic treatment well though he did have to postpone the scheduled day 8 treatment of cycle 2 due to neutropenia and thrombocytopenia. He has since resumed treatments every 2 weeks and tolerating well.  There appears to be a positive resonse to treatment with improvement in his labs.                       On review of systems, the patient states that he is  doing very well and is currently without complaints.  The low back and right chest wall/rib pain has resolved since completion of treatment and he has been able to resume normal activities pain free which he is quite pleased with.  He is walking 1.5 miles most days. He denies any changes in bowel or bladder activity, focal weakness or paraesthesias in the LEs. Overall, he is quite pleased with his progress to date.  ALLERGIES:  has No Known Allergies.  Meds: Current Outpatient Medications  Medication Sig Dispense Refill  . Cholecalciferol 2000 units TABS Take 1 tablet by mouth daily.    . diphenoxylate-atropine (LOMOTIL) 2.5-0.025 MG tablet Take 1 tablet by mouth daily as needed for diarrhea or loose stools. Usually takes 1 tablet a week    . losartan (COZAAR) 50 MG tablet Take 50 mg by mouth daily.     . Psyllium (QC NATURAL VEGETABLE) 95 % POWD Take by mouth.    . sennosides-docusate sodium (SENOKOT-S) 8.6-50 MG tablet Take 1 tablet by mouth as needed for constipation.    . valACYclovir (VALTREX) 500 MG tablet Take 500 mg by mouth daily.     . vitamin B-12 (CYANOCOBALAMIN) 500 MCG tablet Take 500 mcg by mouth daily.    Alveda Reasons 20 MG TABS tablet TAKE 1 TABLET BY MOUTH DAILY WITH SUPPER. 30 tablet 11   No current facility-administered medications for this  encounter.    Physical Findings:  vitals were not taken for this visit.   / Unable to assess due to telephone follow up visit format.    Lab Findings: Lab Results  Component Value Date   WBC 4.7 05/31/2019   HGB 13.7 05/31/2019   HCT 41.9 05/31/2019   MCV 89.1 05/31/2019   PLT 100 (L) 05/31/2019     Radiographic Findings: No results found.  Impression/Plan: 1. 85 y.o. male with painful disease in L5 and right lateral ribs secondary to relapse of multiple myeloma. He appears to have recovered well from the effects of his recent palliative radiotherapy and is currently without complaints. We discussed that while we are happy  to continue to participate in his care if clinically indicated in the future, at this point, we will plan to see him back on an as needed basis.  He will continue in routine follow up under the care and direction of [name] for management of his systemic disease. He knows to call at any time with any questions or concerns related to his recent radiation.    Nicholos Johns, PA-C

## 2019-06-09 NOTE — Progress Notes (Signed)
Pharmacist Chemotherapy Monitoring - Follow Up Assessment    I verify that I have reviewed each item in the below checklist:  . Regimen for the patient is scheduled for the appropriate day and plan matches scheduled date. Marland Kitchen Appropriate non-routine labs are ordered dependent on drug ordered. . If applicable, additional medications reviewed and ordered per protocol based on lifetime cumulative doses and/or treatment regimen.   Plan for follow-up and/or issues identified: No . I-vent associated with next due treatment: No . MD and/or nursing notified: No  Mitchell Lowery [facility] 06/09/2019 11:30 AM

## 2019-06-14 DIAGNOSIS — G620 Drug-induced polyneuropathy: Secondary | ICD-10-CM | POA: Diagnosis not present

## 2019-06-14 DIAGNOSIS — C9002 Multiple myeloma in relapse: Secondary | ICD-10-CM | POA: Diagnosis not present

## 2019-06-14 DIAGNOSIS — T451X5A Adverse effect of antineoplastic and immunosuppressive drugs, initial encounter: Secondary | ICD-10-CM | POA: Diagnosis not present

## 2019-06-14 DIAGNOSIS — D61810 Antineoplastic chemotherapy induced pancytopenia: Secondary | ICD-10-CM | POA: Diagnosis not present

## 2019-06-14 DIAGNOSIS — Z6824 Body mass index (BMI) 24.0-24.9, adult: Secondary | ICD-10-CM | POA: Diagnosis not present

## 2019-06-14 DIAGNOSIS — I2699 Other pulmonary embolism without acute cor pulmonale: Secondary | ICD-10-CM | POA: Diagnosis not present

## 2019-06-15 ENCOUNTER — Inpatient Hospital Stay: Payer: Medicare PPO

## 2019-06-15 ENCOUNTER — Encounter: Payer: Self-pay | Admitting: Nurse Practitioner

## 2019-06-15 ENCOUNTER — Inpatient Hospital Stay: Payer: Medicare PPO | Admitting: Nutrition

## 2019-06-15 ENCOUNTER — Inpatient Hospital Stay (HOSPITAL_BASED_OUTPATIENT_CLINIC_OR_DEPARTMENT_OTHER): Payer: Medicare PPO | Admitting: Nurse Practitioner

## 2019-06-15 ENCOUNTER — Other Ambulatory Visit: Payer: Self-pay

## 2019-06-15 VITALS — BP 138/68 | HR 59 | Temp 97.2°F | Resp 20 | Ht 71.0 in | Wt 176.5 lb

## 2019-06-15 DIAGNOSIS — D696 Thrombocytopenia, unspecified: Secondary | ICD-10-CM | POA: Diagnosis not present

## 2019-06-15 DIAGNOSIS — C9000 Multiple myeloma not having achieved remission: Secondary | ICD-10-CM

## 2019-06-15 DIAGNOSIS — Z5112 Encounter for antineoplastic immunotherapy: Secondary | ICD-10-CM | POA: Diagnosis not present

## 2019-06-15 LAB — CMP (CANCER CENTER ONLY)
ALT: 14 U/L (ref 0–44)
AST: 18 U/L (ref 15–41)
Albumin: 3.2 g/dL — ABNORMAL LOW (ref 3.5–5.0)
Alkaline Phosphatase: 45 U/L (ref 38–126)
Anion gap: 8 (ref 5–15)
BUN: 9 mg/dL (ref 8–23)
CO2: 25 mmol/L (ref 22–32)
Calcium: 8.8 mg/dL — ABNORMAL LOW (ref 8.9–10.3)
Chloride: 108 mmol/L (ref 98–111)
Creatinine: 0.68 mg/dL (ref 0.61–1.24)
GFR, Est AFR Am: >60 mL/min (ref >60)
GFR, Estimated: >60 mL/min (ref >60)
Glucose, Bld: 83 mg/dL (ref 70–99)
Potassium: 4 mmol/L (ref 3.5–5.1)
Sodium: 141 mmol/L (ref 135–145)
Total Bilirubin: 0.6 mg/dL (ref 0.3–1.2)
Total Protein: 5.6 g/dL — ABNORMAL LOW (ref 6.5–8.1)

## 2019-06-15 LAB — CBC WITH DIFFERENTIAL (CANCER CENTER ONLY)
Abs Immature Granulocytes: 0.01 10*3/uL (ref 0.00–0.07)
Basophils Absolute: 0 10*3/uL (ref 0.0–0.1)
Basophils Relative: 1 %
Eosinophils Absolute: 0.1 10*3/uL (ref 0.0–0.5)
Eosinophils Relative: 2 %
HCT: 41.1 % (ref 39.0–52.0)
Hemoglobin: 13.5 g/dL (ref 13.0–17.0)
Immature Granulocytes: 0 %
Lymphocytes Relative: 23 %
Lymphs Abs: 1 10*3/uL (ref 0.7–4.0)
MCH: 29.9 pg (ref 26.0–34.0)
MCHC: 32.8 g/dL (ref 30.0–36.0)
MCV: 91.1 fL (ref 80.0–100.0)
Monocytes Absolute: 0.6 10*3/uL (ref 0.1–1.0)
Monocytes Relative: 14 %
Neutro Abs: 2.6 10*3/uL (ref 1.7–7.7)
Neutrophils Relative %: 60 %
Platelet Count: 97 10*3/uL — ABNORMAL LOW (ref 150–400)
RBC: 4.51 MIL/uL (ref 4.22–5.81)
RDW: 17.1 % — ABNORMAL HIGH (ref 11.5–15.5)
WBC Count: 4.3 10*3/uL (ref 4.0–10.5)
nRBC: 0 % (ref 0.0–0.2)

## 2019-06-15 MED ORDER — MONTELUKAST SODIUM 10 MG PO TABS
10.0000 mg | ORAL_TABLET | Freq: Once | ORAL | Status: AC
  Administered 2019-06-15: 10 mg via ORAL

## 2019-06-15 MED ORDER — MONTELUKAST SODIUM 10 MG PO TABS
ORAL_TABLET | ORAL | Status: AC
  Filled 2019-06-15: qty 1

## 2019-06-15 MED ORDER — ACETAMINOPHEN 325 MG PO TABS
650.0000 mg | ORAL_TABLET | Freq: Once | ORAL | Status: AC
  Administered 2019-06-15: 650 mg via ORAL

## 2019-06-15 MED ORDER — SODIUM CHLORIDE 0.9 % IV SOLN
INTRAVENOUS | Status: DC
  Filled 2019-06-15 (×2): qty 250

## 2019-06-15 MED ORDER — DEXAMETHASONE 4 MG PO TABS
ORAL_TABLET | ORAL | Status: AC
  Filled 2019-06-15: qty 5

## 2019-06-15 MED ORDER — DIPHENHYDRAMINE HCL 25 MG PO CAPS
50.0000 mg | ORAL_CAPSULE | Freq: Once | ORAL | Status: AC
  Administered 2019-06-15: 50 mg via ORAL

## 2019-06-15 MED ORDER — DIPHENHYDRAMINE HCL 25 MG PO CAPS
ORAL_CAPSULE | ORAL | Status: AC
  Filled 2019-06-15: qty 2

## 2019-06-15 MED ORDER — DEXTROSE 5 % IV SOLN
58.0000 mg/m2 | Freq: Once | INTRAVENOUS | Status: AC
  Administered 2019-06-15: 120 mg via INTRAVENOUS
  Filled 2019-06-15: qty 60

## 2019-06-15 MED ORDER — DARATUMUMAB-HYALURONIDASE-FIHJ 1800-30000 MG-UT/15ML ~~LOC~~ SOLN
1800.0000 mg | Freq: Once | SUBCUTANEOUS | Status: AC
  Administered 2019-06-15: 1800 mg via SUBCUTANEOUS
  Filled 2019-06-15: qty 15

## 2019-06-15 MED ORDER — ACETAMINOPHEN 325 MG PO TABS
ORAL_TABLET | ORAL | Status: AC
  Filled 2019-06-15: qty 2

## 2019-06-15 MED ORDER — DEXAMETHASONE 4 MG PO TABS
20.0000 mg | ORAL_TABLET | Freq: Once | ORAL | Status: AC
  Administered 2019-06-15: 20 mg via ORAL

## 2019-06-15 NOTE — Progress Notes (Signed)
Nutrition Assessment  Reason for Assessment: Consult; Patient identified on malnutrition screening for weight loss.   ASSESSMENT: 85 year old male with lumbar 5 and right chest wall myeloma s/p radiation treatment currently undergoing chemotherapy, followed by [name].  PMH: pulmonary embolus, sensorineural hearing loss of both ears, malignant neoplasm of prostate, peripheral neuropathy due to chemotherapy.  Met with patient during infusion today. Patient reports improvements to appetite and intake, endorses 3 meals daily. Patient recalls a big breakfast of cheerios with fresh fruit, toast, yogurt, decaf coffee, and juice. He has a handful of nuts and a smoothie for lunch with a biscuit, and assorted balanced dinners. He endorses random episodes of diarrhea, states that he will occasionally top cereal with Metamucil and Imodium as needed. Patient is active, reports he and his wife walk 1.5 miles daily around the neighborhood.   On 2/24 pt weighed 190 lb, on 3/01 he weighed 186 lb, on 4/07 pt weighed 178 lb, and on 4/21 he weighed 181 lb. This indicates a 14 lb (7.4%) wt loss in 3 months which is not significant, although concerning given advanced age and currently undergoing chemotherapy. RD discussed wt trends with patient and provided education on the importance of consuming adequate calories and protein to maintain weight during treatment. Patient agreeable to drinking 1-2 nutrition supplements daily.   Nutrition Focused Physical Exam: unable to complete at this time  Medications: Cholecalciferol, Psyllium, Senokot, Valtrex, B12, Xarelto  Labs: Albumin 3.2 reflective of inflammatory response, Corrected Ca 9.44 (WNL)   Anthropometrics:   Height: 5'11" Weight: 176.2 lb UBW: 190 lb BMI: 24.62 kg/m2   Estimated Energy Needs  Kcals: 2090-2250 Protein: 97-113 Fluid: > 2 L   NUTRITION DIAGNOSIS: Inadequate oral intake related to multiple myeloma and related treatments as  evidenced by 14 lb (7.4%) wt loss in 3 months.   INTERVENTION: Educated patient on the importance of adequate calorie and protein intake. Encouraged continued physical activity and high protein snacks in between meals.Patient agreeable to daily nutrition supplement, coupons provided.    MONITORING, EVALUATION, GOAL: Patient will tolerate adequate calories and protein to maintain weight   Next Visit: Pending future scheduled infusion as needed   Lajuan Lines, RD, LDN Clinical Nutrition After Hours/Weekend Pager # in [facility]

## 2019-06-15 NOTE — Patient Instructions (Signed)
[facility] Discharge Instructions for Patients Receiving Chemotherapy  Today you received the following chemotherapy agents: carfilzomib and DARZALEX FASPRO.  To help prevent nausea and vomiting after your treatment, we encourage you to take your nausea medication as directed.   If you develop nausea and vomiting that is not controlled by your nausea medication, call the clinic.   BELOW ARE SYMPTOMS THAT SHOULD BE REPORTED IMMEDIATELY:  *FEVER GREATER THAN 100.5 F  *CHILLS WITH OR WITHOUT FEVER  NAUSEA AND VOMITING THAT IS NOT CONTROLLED WITH YOUR NAUSEA MEDICATION  *UNUSUAL SHORTNESS OF BREATH  *UNUSUAL BRUISING OR BLEEDING  TENDERNESS IN MOUTH AND THROAT WITH OR WITHOUT PRESENCE OF ULCERS  *URINARY PROBLEMS  *BOWEL PROBLEMS  UNUSUAL RASH Items with * indicate a potential emergency and should be followed up as soon as possible.  Feel free to call the clinic should you have any questions or concerns. The clinic phone number is [phone].  Please show the [facility] at check-in to the Emergency Department and triage nurse.

## 2019-06-15 NOTE — Progress Notes (Signed)
Per Ned Card, NP: OK to treat w/97,000 platelet count

## 2019-06-15 NOTE — Progress Notes (Addendum)
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  He completed a cycle of carfilzomib/daratumumab 05/31/2019.  He denies nausea/vomiting.  He has periodic loose stools.  He takes Imodium if needed.  He notes he is tired for a few days after each treatment.  He has periodic shortness of breath with exertion.  He remains very active, walking 1-1/2 to 2 miles.  He denies bleeding.  Objective:  Vital signs in last 24 hours:  Blood pressure 138/68, pulse (!) 59, temperature (!) 97.2 F (36.2 C), resp. rate 20, height 5' 11" (1.803 m), weight 176 lb 8 oz (80.1 kg), SpO2 98 %.   Resp: Lungs clear bilaterally. Cardio: Regular rate and rhythm with occasional premature beat, 2/6 systolic murmur. GI: Abdomen soft and nontender.  No hepatosplenomegaly. Vascular: Trace pretibial edema bilaterally.  Lab Results:  Lab Results  Component Value Date   WBC 4.3 06/15/2019   HGB 13.5 06/15/2019   HCT 41.1 06/15/2019   MCV 91.1 06/15/2019   PLT 97 (L) 06/15/2019   NEUTROABS 2.6 06/15/2019    Imaging:  No results found.  Medications: I have reviewed the patient's current medications.  Assessment/Plan: 1. Multiple myeloma-confirmed on a bone marrow biopsy 08/07/2014, IgA lambda  Serum M spike and increased serum free lambda light chains  Myeloma FISH panel negative for chromosome 4, 11, 12, 13, 14, and 17 abnormalities, cytogenetics with no metaphases  Bone survey 08/15/2014 with indeterminant lucent skull lesions  Cycle 1 RVD 08/22/2014  Cycle 2 RVD 09/19/2014  Cycle 3 RVD 10/17/2014  Cycle 4 RVD 11/14/2014 (Decadron reduced to 20 mg weekly, Revlimid 15 mg days 1 through 14, Velcade 3/4 weeks)  Cycle 5 RVD 12/12/2014  Serum M spike not detected 12/26/2014  Cycle 6 RVD 01/09/2015  Maintenance Revlimid, 10 mg daily, 02/05/2015 , discontinue 02/26/2015 secondary to leg edema and  diarrhea  Revlimid resumed at a dose of 10 mg every other day beginning 03/13/2015  PET scan 63/84/5364-WO hypermetabolic bone lesions, few lucent lesions in the spine and pelvis  Revlimid discontinued January 2019  Enrollment on a clinical trial at [facility] with pomalidomide/Decadron+/- ixazomibbeginning 03/10/2017  Treatment discontinued February 2021 secondary to rising serum lambda light chains  PET 04/05/2019-hypermetabolic right axillary/chest wall nodes, right paravertebral mass, small right pleural effusion, retrocrural node, right lateral abdominal wall mass, L5 lesion mild compression deformity. Focus of hypermetabolism at the anterior left pelvic wall without a CT correlate. Hypermetabolic lytic lesion in the left T3 transverse process  Cycle 1 daratumumab/carfilzomib/Decadron 04/05/2019  Radiation to the right chest wall mass and lumbar spine beginning 04/25/2019  Cycle 2 daratumumab/carfilzomib/Decadron 05/04/2019 (carfilzomib dose escalated); day 8 held due to neutropenia, thrombocytopenia; day 15 05/18/2019 (carfilzomib dose reduced)  Daratumumab/carfilzomib/Decadron changed to every 2-week dosing beginning 05/25/2019  2. 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 History of intermittent prostatitis  4. Skin rash 10/24/2014-referred to dermatology, biopsy consistent with local hypersensitivity reaction  5. 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 Left lower leg cellulitis 06/24/2015-treated with Keflex  7. History of mild thrombocytopenia secondary tomyeloma and systemic therapy  8. History of mild neutropenia-secondary to myeloma and systemic therapy  9. Fall with a skull fracture and  subarachnoid blood/right frontal lobe contusions 10/17/2015  10. Right lower lobe pulmonary embolus 03/28/2016. Lovenox , transitioned to Xarelto  11. Vesicular rashJune 2018-potentially related to the zoster vaccine, resolved  12. History of atrial fibrillation  13. 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  The plan is to continue every 2-week carfilzomib/daratumumab.  We reviewed the CBC from today.  Counts adequate to proceed with treatment.  He has stable mild thrombocytopenia.  He will return for lab, follow-up, carfilzomib/daratumumab in 2 weeks.  He will contact the office in the interim with any problems.  Patient seen with [name].  Ned Card ANP/GNP-BC   06/15/2019  12:44 PM  This was a shared visit with Ned Card.  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  There is clinical and laboratory evidence of improvement with the carfilzomib/daratumumab regimen.  The plan is to continue treatment on the current 2-week schedule.  I will contact Dr. Amalia Hailey to discuss the treatment schedule.  Carfilzomib was changed to an every 2-week schedule secondary to hematologic toxicity.  Julieanne Manson, MD

## 2019-06-16 LAB — KAPPA/LAMBDA LIGHT CHAINS
Kappa free light chain: 2 mg/L — ABNORMAL LOW (ref 3.3–19.4)
Kappa, lambda light chain ratio: 0.57 (ref 0.26–1.65)
Lambda free light chains: 3.5 mg/L — ABNORMAL LOW (ref 5.7–26.3)

## 2019-06-16 LAB — IGA: IgA: 8 mg/dL — ABNORMAL LOW (ref 61–437)

## 2019-06-20 DIAGNOSIS — D61818 Other pancytopenia: Secondary | ICD-10-CM | POA: Diagnosis not present

## 2019-06-20 DIAGNOSIS — I10 Essential (primary) hypertension: Secondary | ICD-10-CM | POA: Diagnosis not present

## 2019-06-20 DIAGNOSIS — N400 Enlarged prostate without lower urinary tract symptoms: Secondary | ICD-10-CM | POA: Diagnosis not present

## 2019-06-20 DIAGNOSIS — C61 Malignant neoplasm of prostate: Secondary | ICD-10-CM | POA: Diagnosis not present

## 2019-06-20 DIAGNOSIS — D649 Anemia, unspecified: Secondary | ICD-10-CM | POA: Diagnosis not present

## 2019-06-23 NOTE — Progress Notes (Signed)
Pharmacist Chemotherapy Monitoring - Follow Up Assessment    I verify that I have reviewed each item in the below checklist:  . Regimen for the patient is scheduled for the appropriate day and plan matches scheduled date. Marland Kitchen Appropriate non-routine labs are ordered dependent on drug ordered. . If applicable, additional medications reviewed and ordered per protocol based on lifetime cumulative doses and/or treatment regimen.   Plan for follow-up and/or issues identified: No . I-vent associated with next due treatment: No . MD and/or nursing notified: No  Shakiyah Cirilo D 06/23/2019 9:03 AM

## 2019-06-24 MED FILL — XARELTO 20 MG TABLET: 20 | 30 days supply | Qty: 30 | Fill #3

## 2019-06-26 ENCOUNTER — Other Ambulatory Visit: Payer: Self-pay | Admitting: Oncology

## 2019-06-29 ENCOUNTER — Inpatient Hospital Stay: Payer: Medicare PPO

## 2019-06-29 ENCOUNTER — Inpatient Hospital Stay: Payer: Medicare PPO | Attending: Oncology | Admitting: Nurse Practitioner

## 2019-06-29 ENCOUNTER — Encounter: Payer: Self-pay | Admitting: Nurse Practitioner

## 2019-06-29 ENCOUNTER — Other Ambulatory Visit: Payer: Self-pay

## 2019-06-29 VITALS — BP 125/73 | HR 71 | Temp 97.8°F | Resp 18 | Ht 71.0 in | Wt 178.1 lb

## 2019-06-29 DIAGNOSIS — Z5111 Encounter for antineoplastic chemotherapy: Secondary | ICD-10-CM | POA: Insufficient documentation

## 2019-06-29 DIAGNOSIS — M7989 Other specified soft tissue disorders: Secondary | ICD-10-CM | POA: Insufficient documentation

## 2019-06-29 DIAGNOSIS — C9000 Multiple myeloma not having achieved remission: Secondary | ICD-10-CM

## 2019-06-29 DIAGNOSIS — Z923 Personal history of irradiation: Secondary | ICD-10-CM | POA: Insufficient documentation

## 2019-06-29 DIAGNOSIS — Z79899 Other long term (current) drug therapy: Secondary | ICD-10-CM | POA: Insufficient documentation

## 2019-06-29 DIAGNOSIS — A4150 Gram-negative sepsis, unspecified: Secondary | ICD-10-CM | POA: Diagnosis not present

## 2019-06-29 DIAGNOSIS — L03116 Cellulitis of left lower limb: Secondary | ICD-10-CM | POA: Diagnosis not present

## 2019-06-29 DIAGNOSIS — A419 Sepsis, unspecified organism: Secondary | ICD-10-CM | POA: Insufficient documentation

## 2019-06-29 DIAGNOSIS — J90 Pleural effusion, not elsewhere classified: Secondary | ICD-10-CM | POA: Insufficient documentation

## 2019-06-29 LAB — CMP (CANCER CENTER ONLY)
ALT: 14 U/L (ref 0–44)
AST: 18 U/L (ref 15–41)
Albumin: 3.5 g/dL (ref 3.5–5.0)
Alkaline Phosphatase: 46 U/L (ref 38–126)
Anion gap: 9 (ref 5–15)
BUN: 8 mg/dL (ref 8–23)
CO2: 27 mmol/L (ref 22–32)
Calcium: 9.1 mg/dL (ref 8.9–10.3)
Chloride: 106 mmol/L (ref 98–111)
Creatinine: 0.76 mg/dL (ref 0.61–1.24)
GFR, Est AFR Am: >60 mL/min (ref >60)
GFR, Estimated: >60 mL/min (ref >60)
Glucose, Bld: 89 mg/dL (ref 70–99)
Potassium: 4 mmol/L (ref 3.5–5.1)
Sodium: 142 mmol/L (ref 135–145)
Total Bilirubin: 0.7 mg/dL (ref 0.3–1.2)
Total Protein: 5.9 g/dL — ABNORMAL LOW (ref 6.5–8.1)

## 2019-06-29 LAB — CBC WITH DIFFERENTIAL (CANCER CENTER ONLY)
Abs Immature Granulocytes: 0.02 10*3/uL (ref 0.00–0.07)
Basophils Absolute: 0 10*3/uL (ref 0.0–0.1)
Basophils Relative: 0 %
Eosinophils Absolute: 0.1 10*3/uL (ref 0.0–0.5)
Eosinophils Relative: 1 %
HCT: 42.4 % (ref 39.0–52.0)
Hemoglobin: 13.9 g/dL (ref 13.0–17.0)
Immature Granulocytes: 0 %
Lymphocytes Relative: 13 %
Lymphs Abs: 0.6 10*3/uL — ABNORMAL LOW (ref 0.7–4.0)
MCH: 31 pg (ref 26.0–34.0)
MCHC: 32.8 g/dL (ref 30.0–36.0)
MCV: 94.6 fL (ref 80.0–100.0)
Monocytes Absolute: 0.6 10*3/uL (ref 0.1–1.0)
Monocytes Relative: 13 %
Neutro Abs: 3.5 10*3/uL (ref 1.7–7.7)
Neutrophils Relative %: 73 %
Platelet Count: 100 10*3/uL — ABNORMAL LOW (ref 150–400)
RBC: 4.48 MIL/uL (ref 4.22–5.81)
RDW: 16.1 % — ABNORMAL HIGH (ref 11.5–15.5)
WBC Count: 4.8 10*3/uL (ref 4.0–10.5)
nRBC: 0 % (ref 0.0–0.2)

## 2019-06-29 NOTE — Progress Notes (Signed)
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  He completed another cycle of carfilzomib/daratumumab 06/15/2019.  He denies nausea/vomiting.  Bowels tend to alternate constipation and diarrhea.  No change in appetite.  Mild fatigue. He remains very active.  He walks 1-1/2 to 2 miles a day.  He continues Xarelto.  No bleeding.  He denies pain.  Objective:  Vital signs in last 24 hours:  Blood pressure 125/73, pulse 71, temperature 97.8 F (36.6 C), temperature source Temporal, resp. rate 18, height '5\' 11"'  (1.803 m), weight 178 lb 1.6 oz (80.8 kg), SpO2 98 %.    HEENT: No thrush or ulcers. Resp: Lungs clear bilaterally. Cardio: Regular rate and rhythm. GI: Abdomen soft and nontender.  No hepatomegaly.  No splenomegaly. Vascular: Trace edema at the lower legs bilaterally. Neuro: Alert and oriented. Skin: No rash. Musculoskeletal: No mass right lateral chest wall/abdomen.   Lab Results:  Lab Results  Component Value Date   WBC 4.8 06/29/2019   HGB 13.9 06/29/2019   HCT 42.4 06/29/2019   MCV 94.6 06/29/2019   PLT 100 (L) 06/29/2019   NEUTROABS 3.5 06/29/2019    Imaging:  No results found.  Medications: I have reviewed the patient's current medications.  Assessment/Plan: 1. Multiple myeloma-confirmed on a bone marrow biopsy 08/07/2014, IgA lambda  Serum M spike and increased serum free lambda light chains  Myeloma FISH panel negative for chromosome 4, 11, 12, 13, 14, and 17 abnormalities, cytogenetics with no metaphases  Bone survey 08/15/2014 with indeterminant lucent skull lesions  Cycle 1 RVD 08/22/2014  Cycle 2 RVD 09/19/2014  Cycle 3 RVD 10/17/2014  Cycle 4 RVD 11/14/2014 (Decadron reduced to 20 mg weekly, Revlimid 15 mg days 1 through 14, Velcade 3/4 weeks)  Cycle 5 RVD 12/12/2014  Serum M spike not detected 12/26/2014  Cycle 6 RVD 01/09/2015  Maintenance  Revlimid, 10 mg daily, 02/05/2015 , discontinue 02/26/2015 secondary to leg edema and diarrhea  Revlimid resumed at a dose of 10 mg every other day beginning 03/13/2015  PET scan 48/18/5631-SH hypermetabolic bone lesions, few lucent lesions in the spine and pelvis  Revlimid discontinued January 2019  [facility] with pomalidomide/Decadron+/- ixazomibbeginning 03/10/2017  Treatment discontinued February 2021 secondary to rising serum lambda light chains  PET 04/05/2019-hypermetabolic right axillary/chest wall nodes, right paravertebral mass, small right pleural effusion, retrocrural node, right lateral abdominal wall mass, L5 lesion mild compression deformity. Focus of hypermetabolism at the anterior left pelvic wall without a CT correlate. Hypermetabolic lytic lesion in the left T3 transverse process  Cycle 1 daratumumab/carfilzomib/Decadron 04/05/2019  Radiation to the right chest wall mass and lumbar spine beginning 04/25/2019  Cycle 2 daratumumab/carfilzomib/Decadron 05/04/2019 (carfilzomib dose escalated); day 8 held due to neutropenia, thrombocytopenia; day 15 05/18/2019 (carfilzomib dose reduced)  Daratumumab/carfilzomib/Decadron changed to every 2-week dosing beginning 05/25/2019  2. 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 History of intermittent prostatitis  4. Skin rash 10/24/2014-referred to dermatology, biopsy consistent with local hypersensitivity reaction  5. 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 Left lower leg cellulitis 06/24/2015-treated with Keflex  7. History of mild thrombocytopenia secondary tomyeloma and systemic therapy  8. History of mild  neutropenia-secondary to myeloma and systemic therapy  9.  Fall with a skull fracture and subarachnoid blood/right frontal lobe contusions 10/17/2015  10. Right lower lobe pulmonary embolus 03/28/2016. Lovenox , transitioned to Xarelto  11. Vesicular rashJune 2018-potentially related to the zoster vaccine, resolved  12. History of atrial fibrillation  13. 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  He continues every 2-week carfilzomib/daratumumab.  He is tolerating treatment well.  Plan to continue the same.  We reviewed the CBC from today.  Counts adequate to proceed with treatment tomorrow as scheduled.  He has stable mild thrombocytopenia.  He will return for lab, follow-up, treatment in 2 weeks.  He will contact the office in the interim with any problems.    Ned Card ANP/GNP-BC   06/29/2019  1:22 PM

## 2019-06-30 ENCOUNTER — Other Ambulatory Visit: Payer: Self-pay

## 2019-06-30 ENCOUNTER — Inpatient Hospital Stay: Payer: Medicare PPO

## 2019-06-30 VITALS — BP 117/69 | HR 68 | Temp 98.4°F | Resp 16

## 2019-06-30 DIAGNOSIS — C9000 Multiple myeloma not having achieved remission: Secondary | ICD-10-CM

## 2019-06-30 MED ORDER — DIPHENHYDRAMINE HCL 25 MG PO CAPS
50.0000 mg | ORAL_CAPSULE | Freq: Once | ORAL | Status: AC
  Administered 2019-06-30: 50 mg via ORAL

## 2019-06-30 MED ORDER — DARATUMUMAB-HYALURONIDASE-FIHJ 1800-30000 MG-UT/15ML ~~LOC~~ SOLN
1800.0000 mg | Freq: Once | SUBCUTANEOUS | Status: AC
  Administered 2019-06-30: 1800 mg via SUBCUTANEOUS
  Filled 2019-06-30: qty 15

## 2019-06-30 MED ORDER — DEXAMETHASONE 4 MG PO TABS
20.0000 mg | ORAL_TABLET | Freq: Once | ORAL | Status: AC
  Administered 2019-06-30: 20 mg via ORAL

## 2019-06-30 MED ORDER — DIPHENHYDRAMINE HCL 25 MG PO CAPS
ORAL_CAPSULE | ORAL | Status: AC
  Filled 2019-06-30: qty 2

## 2019-06-30 MED ORDER — MONTELUKAST SODIUM 10 MG PO TABS
ORAL_TABLET | ORAL | Status: AC
  Filled 2019-06-30: qty 1

## 2019-06-30 MED ORDER — MONTELUKAST SODIUM 10 MG PO TABS
10.0000 mg | ORAL_TABLET | Freq: Once | ORAL | Status: AC
  Administered 2019-06-30: 10 mg via ORAL

## 2019-06-30 MED ORDER — ACETAMINOPHEN 325 MG PO TABS
ORAL_TABLET | ORAL | Status: AC
  Filled 2019-06-30: qty 2

## 2019-06-30 MED ORDER — ACETAMINOPHEN 325 MG PO TABS
650.0000 mg | ORAL_TABLET | Freq: Once | ORAL | Status: AC
  Administered 2019-06-30: 650 mg via ORAL

## 2019-06-30 MED ORDER — SODIUM CHLORIDE 0.9 % IV SOLN
INTRAVENOUS | Status: DC
  Filled 2019-06-30: qty 250

## 2019-06-30 MED ORDER — DEXAMETHASONE 4 MG PO TABS
ORAL_TABLET | ORAL | Status: AC
  Filled 2019-06-30: qty 5

## 2019-06-30 MED ORDER — DEXTROSE 5 % IV SOLN
58.0000 mg/m2 | Freq: Once | INTRAVENOUS | Status: AC
  Administered 2019-06-30: 120 mg via INTRAVENOUS
  Filled 2019-06-30: qty 60

## 2019-06-30 NOTE — Patient Instructions (Signed)
[facility]chell [facility] Discharge Instructions for Patients Receiving Chemotherapy  Today you received the following chemotherapy agents Kyprolis and Darzalex Faspro  To help prevent nausea and vomiting after your treatment, we encourage you to take your nausea medication as directed.  If you develop nausea and vomiting that is not controlled by your nausea medication, call the clinic.   BELOW ARE SYMPTOMS THAT SHOULD BE REPORTED IMMEDIATELY:  *FEVER GREATER THAN 100.5 F  *CHILLS WITH OR WITHOUT FEVER  NAUSEA AND VOMITING THAT IS NOT CONTROLLED WITH YOUR NAUSEA MEDICATION  *UNUSUAL SHORTNESS OF BREATH  *UNUSUAL BRUISING OR BLEEDING  TENDERNESS IN MOUTH AND THROAT WITH OR WITHOUT PRESENCE OF ULCERS  *URINARY PROBLEMS  *BOWEL PROBLEMS  UNUSUAL RASH Items with * indicate a potential emergency and should be followed up as soon as possible.  Feel free to call the clinic should you have any questions or concerns. The clinic phone number is [phone].  Please show the [place] at check-in to the Emergency Department and triage nurse.

## 2019-07-01 ENCOUNTER — Telehealth: Payer: Self-pay | Admitting: Nurse Practitioner

## 2019-07-01 NOTE — Telephone Encounter (Signed)
Scheduled appt per 6/2 los - pt to get an updated schedule next visit.   

## 2019-07-02 ENCOUNTER — Inpatient Hospital Stay (HOSPITAL_COMMUNITY)
Admission: EM | Admit: 2019-07-02 | Discharge: 2019-07-05 | DRG: 871 | Disposition: A | Discharge status: Home / Self Care | Payer: Medicare PPO | Attending: Internal Medicine | Admitting: Internal Medicine

## 2019-07-02 ENCOUNTER — Other Ambulatory Visit: Payer: Self-pay

## 2019-07-02 DIAGNOSIS — K5900 Constipation, unspecified: Secondary | ICD-10-CM | POA: Diagnosis present

## 2019-07-02 DIAGNOSIS — R222 Localized swelling, mass and lump, trunk: Secondary | ICD-10-CM | POA: Diagnosis present

## 2019-07-02 DIAGNOSIS — Z86711 Personal history of pulmonary embolism: Secondary | ICD-10-CM

## 2019-07-02 DIAGNOSIS — A4150 Gram-negative sepsis, unspecified: Secondary | ICD-10-CM | POA: Diagnosis not present

## 2019-07-02 DIAGNOSIS — D849 Immunodeficiency, unspecified: Secondary | ICD-10-CM | POA: Diagnosis not present

## 2019-07-02 DIAGNOSIS — I2699 Other pulmonary embolism without acute cor pulmonale: Secondary | ICD-10-CM | POA: Diagnosis present

## 2019-07-02 DIAGNOSIS — Z7901 Long term (current) use of anticoagulants: Secondary | ICD-10-CM | POA: Diagnosis not present

## 2019-07-02 DIAGNOSIS — Z8546 Personal history of malignant neoplasm of prostate: Secondary | ICD-10-CM | POA: Diagnosis not present

## 2019-07-02 DIAGNOSIS — R7881 Bacteremia: Secondary | ICD-10-CM | POA: Diagnosis present

## 2019-07-02 DIAGNOSIS — L03116 Cellulitis of left lower limb: Secondary | ICD-10-CM | POA: Diagnosis present

## 2019-07-02 DIAGNOSIS — I890 Lymphedema, not elsewhere classified: Secondary | ICD-10-CM | POA: Diagnosis present

## 2019-07-02 DIAGNOSIS — D709 Neutropenia, unspecified: Secondary | ICD-10-CM | POA: Diagnosis present

## 2019-07-02 DIAGNOSIS — I350 Nonrheumatic aortic (valve) stenosis: Secondary | ICD-10-CM | POA: Diagnosis present

## 2019-07-02 DIAGNOSIS — R6520 Severe sepsis without septic shock: Secondary | ICD-10-CM | POA: Diagnosis not present

## 2019-07-02 DIAGNOSIS — D61810 Antineoplastic chemotherapy induced pancytopenia: Secondary | ICD-10-CM | POA: Diagnosis not present

## 2019-07-02 DIAGNOSIS — Z20822 Contact with and (suspected) exposure to covid-19: Secondary | ICD-10-CM | POA: Diagnosis present

## 2019-07-02 DIAGNOSIS — E876 Hypokalemia: Secondary | ICD-10-CM | POA: Diagnosis present

## 2019-07-02 DIAGNOSIS — Z808 Family history of malignant neoplasm of other organs or systems: Secondary | ICD-10-CM | POA: Diagnosis not present

## 2019-07-02 DIAGNOSIS — L02416 Cutaneous abscess of left lower limb: Secondary | ICD-10-CM | POA: Diagnosis present

## 2019-07-02 DIAGNOSIS — I10 Essential (primary) hypertension: Secondary | ICD-10-CM | POA: Diagnosis present

## 2019-07-02 DIAGNOSIS — A419 Sepsis, unspecified organism: Secondary | ICD-10-CM | POA: Diagnosis not present

## 2019-07-02 DIAGNOSIS — C61 Malignant neoplasm of prostate: Secondary | ICD-10-CM | POA: Diagnosis not present

## 2019-07-02 DIAGNOSIS — K649 Unspecified hemorrhoids: Secondary | ICD-10-CM | POA: Diagnosis present

## 2019-07-02 DIAGNOSIS — D696 Thrombocytopenia, unspecified: Secondary | ICD-10-CM | POA: Diagnosis not present

## 2019-07-02 DIAGNOSIS — B9689 Other specified bacterial agents as the cause of diseases classified elsewhere: Secondary | ICD-10-CM | POA: Diagnosis not present

## 2019-07-02 DIAGNOSIS — I480 Paroxysmal atrial fibrillation: Secondary | ICD-10-CM | POA: Diagnosis present

## 2019-07-02 DIAGNOSIS — G620 Drug-induced polyneuropathy: Secondary | ICD-10-CM | POA: Diagnosis present

## 2019-07-02 DIAGNOSIS — T451X5A Adverse effect of antineoplastic and immunosuppressive drugs, initial encounter: Secondary | ICD-10-CM | POA: Diagnosis present

## 2019-07-02 DIAGNOSIS — C9000 Multiple myeloma not having achieved remission: Secondary | ICD-10-CM | POA: Diagnosis not present

## 2019-07-02 DIAGNOSIS — H903 Sensorineural hearing loss, bilateral: Secondary | ICD-10-CM | POA: Diagnosis present

## 2019-07-02 DIAGNOSIS — S81802A Unspecified open wound, left lower leg, initial encounter: Secondary | ICD-10-CM | POA: Diagnosis present

## 2019-07-02 DIAGNOSIS — Z79899 Other long term (current) drug therapy: Secondary | ICD-10-CM

## 2019-07-02 DIAGNOSIS — R21 Rash and other nonspecific skin eruption: Secondary | ICD-10-CM | POA: Diagnosis present

## 2019-07-02 DIAGNOSIS — Z923 Personal history of irradiation: Secondary | ICD-10-CM

## 2019-07-02 DIAGNOSIS — D6959 Other secondary thrombocytopenia: Secondary | ICD-10-CM | POA: Diagnosis present

## 2019-07-02 LAB — CBC WITH DIFFERENTIAL/PLATELET
Abs Immature Granulocytes: 0.05 10*3/uL (ref 0.00–0.07)
Basophils Absolute: 0 10*3/uL (ref 0.0–0.1)
Basophils Relative: 0 %
Eosinophils Absolute: 0 10*3/uL (ref 0.0–0.5)
Eosinophils Relative: 0 %
HCT: 42.9 % (ref 39.0–52.0)
Hemoglobin: 13.8 g/dL (ref 13.0–17.0)
Immature Granulocytes: 1 %
Lymphocytes Relative: 2 %
Lymphs Abs: 0.1 10*3/uL — ABNORMAL LOW (ref 0.7–4.0)
MCH: 31.2 pg (ref 26.0–34.0)
MCHC: 32.2 g/dL (ref 30.0–36.0)
MCV: 96.8 fL (ref 80.0–100.0)
Monocytes Absolute: 0.2 10*3/uL (ref 0.1–1.0)
Monocytes Relative: 6 %
Neutro Abs: 3.3 10*3/uL (ref 1.7–7.7)
Neutrophils Relative %: 91 %
Platelets: 36 10*3/uL — ABNORMAL LOW (ref 150–400)
RBC: 4.43 MIL/uL (ref 4.22–5.81)
RDW: 16.2 % — ABNORMAL HIGH (ref 11.5–15.5)
WBC: 3.6 10*3/uL — ABNORMAL LOW (ref 4.0–10.5)
nRBC: 0 % (ref 0.0–0.2)

## 2019-07-02 LAB — COMPREHENSIVE METABOLIC PANEL
ALT: 18 U/L (ref 0–44)
AST: 23 U/L (ref 15–41)
Albumin: 3.7 g/dL (ref 3.5–5.0)
Alkaline Phosphatase: 39 U/L (ref 38–126)
Anion gap: 7 (ref 5–15)
BUN: 18 mg/dL (ref 8–23)
CO2: 27 mmol/L (ref 22–32)
Calcium: 8.9 mg/dL (ref 8.9–10.3)
Chloride: 107 mmol/L (ref 98–111)
Creatinine, Ser: 0.72 mg/dL (ref 0.61–1.24)
GFR calc Af Amer: >60 mL/min (ref >60)
GFR calc non Af Amer: >60 mL/min (ref >60)
Glucose, Bld: 111 mg/dL — ABNORMAL HIGH (ref 70–99)
Potassium: 4 mmol/L (ref 3.5–5.1)
Sodium: 141 mmol/L (ref 135–145)
Total Bilirubin: 1.1 mg/dL (ref 0.3–1.2)
Total Protein: 6.2 g/dL — ABNORMAL LOW (ref 6.5–8.1)

## 2019-07-02 LAB — SARS CORONAVIRUS 2 BY RT PCR (HOSPITAL ORDER, PERFORMED IN ~~LOC~~ HOSPITAL LAB): SARS Coronavirus 2: NEGATIVE

## 2019-07-02 LAB — PROTIME-INR
INR: 1.1 (ref 0.8–1.2)
Prothrombin Time: 13.4 seconds (ref 11.4–15.2)

## 2019-07-02 LAB — LACTIC ACID, PLASMA
Lactic Acid, Venous: 2.7 mmol/L (ref 0.5–1.9)
Lactic Acid, Venous: 3.2 mmol/L (ref 0.5–1.9)

## 2019-07-02 MED ORDER — VANCOMYCIN HCL IN DEXTROSE 1-5 GM/200ML-% IV SOLN
1000.0000 mg | Freq: Once | INTRAVENOUS | Status: AC
  Administered 2019-07-02: 1000 mg via INTRAVENOUS
  Filled 2019-07-02: qty 200

## 2019-07-02 MED ORDER — ACETAMINOPHEN 650 MG RE SUPP: 650.0000 mg | Freq: Four times a day (QID) | RECTAL | Status: DC | PRN

## 2019-07-02 MED ORDER — DIPHENOXYLATE-ATROPINE 2.5-0.025 MG PO TABS: 1.0000 | ORAL_TABLET | Freq: Every day | ORAL | Status: DC | PRN

## 2019-07-02 MED ORDER — PSYLLIUM 95 % PO PACK
1.0000 | PACK | Freq: Every day | ORAL | Status: DC
  Filled 2019-07-02 (×3): qty 1

## 2019-07-02 MED ORDER — SODIUM CHLORIDE 0.9 % IV SOLN: INTRAVENOUS | Status: DC

## 2019-07-02 MED ORDER — SODIUM CHLORIDE 0.9 % IV BOLUS
1000.0000 mL | Freq: Once | INTRAVENOUS | Status: AC
  Administered 2019-07-02: 1000 mL via INTRAVENOUS

## 2019-07-02 MED ORDER — METRONIDAZOLE IN NACL 5-0.79 MG/ML-% IV SOLN
500.0000 mg | Freq: Three times a day (TID) | INTRAVENOUS | Status: DC
  Administered 2019-07-02 – 2019-07-04 (×5): 500 mg via INTRAVENOUS
  Filled 2019-07-02 (×5): qty 100

## 2019-07-02 MED ORDER — RIVAROXABAN 20 MG PO TABS
20.0000 mg | ORAL_TABLET | Freq: Every day | ORAL | Status: DC
  Administered 2019-07-02: 20 mg via ORAL
  Filled 2019-07-02: qty 1

## 2019-07-02 MED ORDER — PIPERACILLIN-TAZOBACTAM 3.375 G IVPB 30 MIN
3.3750 g | Freq: Once | INTRAVENOUS | Status: AC
  Administered 2019-07-02: 3.375 g via INTRAVENOUS
  Filled 2019-07-02: qty 50

## 2019-07-02 MED ORDER — SENNOSIDES-DOCUSATE SODIUM 8.6-50 MG PO TABS: 1.0000 | ORAL_TABLET | Freq: Every day | ORAL | Status: DC | PRN

## 2019-07-02 MED ORDER — CEFAZOLIN SODIUM-DEXTROSE 1-4 GM/50ML-% IV SOLN: 1.0000 g | Freq: Once | INTRAVENOUS | Status: DC

## 2019-07-02 MED ORDER — VITAMIN D 25 MCG (1000 UNIT) PO TABS
1000.0000 [IU] | ORAL_TABLET | Freq: Every evening | ORAL | Status: DC
  Administered 2019-07-02 – 2019-07-04 (×3): 1000 [IU] via ORAL
  Filled 2019-07-02 (×3): qty 1

## 2019-07-02 MED ORDER — ONDANSETRON HCL 4 MG PO TABS: 4.0000 mg | ORAL_TABLET | Freq: Four times a day (QID) | ORAL | Status: DC | PRN

## 2019-07-02 MED ORDER — SODIUM CHLORIDE 0.9 % IV SOLN: 2.0000 g | Freq: Once | INTRAVENOUS | Status: DC

## 2019-07-02 MED ORDER — VALACYCLOVIR HCL 500 MG PO TABS
500.0000 mg | ORAL_TABLET | Freq: Every day | ORAL | Status: DC
  Administered 2019-07-02 – 2019-07-05 (×4): 500 mg via ORAL
  Filled 2019-07-02 (×4): qty 1

## 2019-07-02 MED ORDER — VITAMIN B-12 1000 MCG PO TABS
500.0000 ug | ORAL_TABLET | Freq: Every day | ORAL | Status: DC
  Administered 2019-07-03 – 2019-07-05 (×3): 500 ug via ORAL
  Filled 2019-07-02: qty 0.5
  Filled 2019-07-02 (×2): qty 1

## 2019-07-02 MED ORDER — ACETAMINOPHEN 500 MG PO TABS
1000.0000 mg | ORAL_TABLET | Freq: Once | ORAL | Status: AC
  Administered 2019-07-02: 1000 mg via ORAL
  Filled 2019-07-02: qty 2

## 2019-07-02 MED ORDER — SODIUM CHLORIDE 0.9 % IV SOLN
2.0000 g | Freq: Three times a day (TID) | INTRAVENOUS | Status: DC
  Administered 2019-07-02 – 2019-07-03 (×2): 2 g via INTRAVENOUS
  Filled 2019-07-02 (×3): qty 2

## 2019-07-02 MED ORDER — ONDANSETRON HCL 4 MG/2ML IJ SOLN: 4.0000 mg | Freq: Four times a day (QID) | INTRAMUSCULAR | Status: DC | PRN

## 2019-07-02 MED ORDER — ACETAMINOPHEN 325 MG PO TABS: 650.0000 mg | ORAL_TABLET | Freq: Four times a day (QID) | ORAL | Status: DC | PRN

## 2019-07-02 MED ORDER — LOSARTAN POTASSIUM 50 MG PO TABS
50.0000 mg | ORAL_TABLET | Freq: Every day | ORAL | Status: DC
  Administered 2019-07-03 – 2019-07-05 (×3): 50 mg via ORAL
  Filled 2019-07-02 (×3): qty 1

## 2019-07-02 NOTE — H&P (Signed)
History and Physical   Mitchell Lowery NLZ:[id number] DOB: 1934/04/29 DOA: 07/02/2019  Referring MD/NP/PA: Dr. Darl Householder  PCP: [name], MD   Outpatient Specialists: None  Patient coming from: Home  Chief Complaint: Fever chills and left lower extremity redness  HPI: Mitchell Lowery is a 85 y.o. male with medical history significant of cellulitis of the lower extremity, prostate cancer, multiple myeloma on chemotherapy with last chemotherapy 3 days ago, history of pulmonary embolism on chronic anticoagulation, who presented to the ER with sudden onset of fever chills and redness in the left lower extremity above his ankle.  He has a small opening wound like a carbuncle in the area the infection spread so fast in the last day.  Is failed the fever up to 102 and some chills.  Patient seen in the ER and appears to have early sepsis secondary to cellulitis.  Patient has pancytopenia from his chemotherapy.  Appears to be immunocompromised making the infection probably spread faster.  Denied any nausea vomiting or diarrhea.  Denied any other symptoms.  Is being admitted for treatment of the early sepsis due to cellulitis..  ED Course: Temperature is 102.9 blood pressure 171/89 pulse 108 respirate of 19 oxygen sats 92% room air.  Lactic acid 2.7 and then 3.2.  INR 1.1.  COVID-19 is negative.  Chemistry showed glucose of 111 otherwise all within normal.  White count is 3.6 hemoglobin 13.8 and platelets of 38.  Blood cultures obtained and patient is being admitted for treatment.  Review of Systems: As per HPI otherwise 10 point review of systems negative.    Past Medical History:  Diagnosis Date   Anal fistula    treated in [place]   Anemia    only due to myeloma- "normal now"   Heart murmur    Hemorrhoids    Multiple myeloma (HCC)    1 month remission now-last tx. 1 month ago- /[name]   Prostate cancer [facility])    urinary retention, surgery planned-prior radiation, and seed implant  about 1 year ago.   Prostatitis    Pulmonary embolus, left ([place]) 03/28/2016   UTI (lower urinary tract infection)    multiple urinary trract infection- being tx presently with antibiotic at present.    Past Surgical History:  Procedure Laterality Date   ANAL FISTULECTOMY     ANKLE SURGERY     CYSTOSCOPY     CYSTOSCOPY WITH INSERTION OF UROLIFT N/A 03/12/2015   Procedure: CYSTOSCOPY WITH INSERTION OF UROLIFT;  Surgeon: Franchot Gallo, MD;  Location: [facility];  Service: Urology;  Laterality: N/A;   PROSTATE BIOPSY     PROSTATE BIOPSY     RADIOACTIVE SEED IMPLANT N/A 07/21/2014   Procedure: RADIOACTIVE SEED IMPLANT/BRACHYTHERAPY IMPLANT;  Surgeon: Franchot Gallo, MD;  Location: [facility];  Service: Urology;  Laterality: N/A;   TONSILLECTOMY       reports that he has never smoked. He has never used smokeless tobacco. He reports current alcohol use. He reports that he does not use drugs.  No Known Allergies  Family History  Problem Relation Age of Onset   Cancer Maternal Grandfather        mouth/throat ca     Prior to Admission medications   Medication Sig Start Date End Date Taking? Authorizing Provider  Cholecalciferol 2000 units TABS Take 1 tablet by mouth every evening.    Yes [provider]  diphenoxylate-atropine (LOMOTIL) 2.5-0.025 MG tablet Take 1 tablet by mouth daily as needed for  diarrhea or loose stools. Usually takes 1 tablet a week 03/11/16  Yes [provider]  losartan (COZAAR) 50 MG tablet Take 50 mg by mouth daily.    Yes [provider]  Psyllium (QC NATURAL VEGETABLE) 95 % POWD Take 1 packet by mouth daily.    Yes [provider]  sennosides-docusate sodium (SENOKOT-S) 8.6-50 MG tablet Take 1 tablet by mouth daily as needed for constipation.    Yes [provider]  valACYclovir (VALTREX) 500 MG tablet Take 500 mg by mouth daily.  12/03/17  Yes [provider]  vitamin B-12  (CYANOCOBALAMIN) 500 MCG tablet Take 500 mcg by mouth daily.   Yes [provider]  XARELTO 20 MG TABS tablet TAKE 1 TABLET BY MOUTH DAILY WITH SUPPER. Patient taking differently: Take 20 mg by mouth daily with supper.  03/25/19  Yes Ladell Pier, MD    Physical Exam: Vitals:   07/02/19 1745 07/02/19 1800 07/02/19 1815 07/02/19 1830  BP: 127/66 (!) 142/75 126/66 136/71  Pulse: 94 (!) 104 98 (!) 103  Resp:   16 18  Temp:    100.2 F (37.9 C)  TempSrc:    Oral  SpO2: 94% 93% 92% 92%  Weight:      Height:          Constitutional: Acutely ill looking, no distress Vitals:   07/02/19 1745 07/02/19 1800 07/02/19 1815 07/02/19 1830  BP: 127/66 (!) 142/75 126/66 136/71  Pulse: 94 (!) 104 98 (!) 103  Resp:   16 18  Temp:    100.2 F (37.9 C)  TempSrc:    Oral  SpO2: 94% 93% 92% 92%  Weight:      Height:       Eyes: PERRL, lids and conjunctivae normal ENMT: Mucous membranes are moist. Posterior pharynx clear of any exudate or lesions.Normal dentition.  Neck: normal, supple, no masses, no thyromegaly Respiratory: clear to auscultation bilaterally, no wheezing, no crackles. Normal respiratory effort. No accessory muscle use.  Cardiovascular: Sinus tachycardia no murmurs / rubs / gallops. No extremity edema. 2+ pedal pulses. No carotid bruits.  Abdomen: no tenderness, no masses palpated. No hepatosplenomegaly. Bowel sounds positive.  Musculoskeletal: no clubbing / cyanosis. No joint deformity upper and lower extremities. Good ROM, no contractures. Normal muscle tone.  Skin: Left lower extremity red swelling with little space on the lateral side of small carbuncle, is warm to touch between the ankle halfway to the knee Neurologic: CN 2-12 grossly intact. Sensation intact, DTR normal. Strength 5/5 in all 4.  Psychiatric: Normal judgment and insight. Alert and oriented x 3. Normal mood.     Labs on Admission: I have personally reviewed following labs and imaging  studies  CBC: Recent Labs  Lab 06/29/19 1307 07/02/19 1609  WBC 4.8 3.6*  NEUTROABS 3.5 3.3  HGB 13.9 13.8  HCT 42.4 42.9  MCV 94.6 96.8  PLT 100* 36*   Basic Metabolic Panel: Recent Labs  Lab 06/29/19 1307 07/02/19 1609  NA 142 141  K 4.0 4.0  CL 106 107  CO2 27 27  GLUCOSE 89 111*  BUN 8 18  CREATININE 0.76 0.72  CALCIUM 9.1 8.9   GFR: Estimated Creatinine Clearance: 74.5 mL/min (by C-G formula based on SCr of 0.72 mg/dL). Liver Function Tests: Recent Labs  Lab 06/29/19 1307 07/02/19 1609  AST 18 23  ALT 14 18  ALKPHOS 46 39  BILITOT 0.7 1.1  PROT 5.9* 6.2*  ALBUMIN 3.5 3.7  No results for input(s): LIPASE, AMYLASE in the last 168 hours. No results for input(s): AMMONIA in the last 168 hours. Coagulation Profile: Recent Labs  Lab 07/02/19 1638  INR 1.1   Cardiac Enzymes: No results for input(s): CKTOTAL, CKMB, CKMBINDEX, TROPONINI in the last 168 hours. BNP (last 3 results) No results for input(s): PROBNP in the last 8760 hours. HbA1C: No results for input(s): HGBA1C in the last 72 hours. CBG: No results for input(s): GLUCAP in the last 168 hours. Lipid Profile: No results for input(s): CHOL, HDL, LDLCALC, TRIG, CHOLHDL, LDLDIRECT in the last 72 hours. Thyroid Function Tests: No results for input(s): TSH, T4TOTAL, FREET4, T3FREE, THYROIDAB in the last 72 hours. Anemia Panel: No results for input(s): VITAMINB12, FOLATE, FERRITIN, TIBC, IRON, RETICCTPCT in the last 72 hours. Urine analysis:    Component Value Date/Time   COLORURINE AMBER (A) 06/24/2015 1729   APPEARANCEUR CLEAR 06/24/2015 1729   LABSPEC 1.022 06/24/2015 1729   LABSPEC 1.020 01/09/2015 0917   PHURINE 7.5 06/24/2015 1729   GLUCOSEU NEGATIVE 06/24/2015 1729   GLUCOSEU Negative 01/09/2015 0917   HGBUR TRACE (A) 06/24/2015 1729   BILIRUBINUR NEGATIVE 06/24/2015 1729   BILIRUBINUR Negative 01/09/2015 0917   KETONESUR NEGATIVE 06/24/2015 1729   PROTEINUR 30 (A) 06/24/2015 1729    UROBILINOGEN 0.2 01/09/2015 0917   NITRITE NEGATIVE 06/24/2015 1729   LEUKOCYTESUR NEGATIVE 06/24/2015 1729   LEUKOCYTESUR Small 01/09/2015 0917   Sepsis Labs: _0 (procalcitonin:4,lacticidven:4) )No results found for this or any previous visit (from the past 240 hour(s)).   Radiological Exams on Admission: No results found.  EKG: Independently reviewed.  Sinus tachycardia with no significant ST changes  Assessment/Plan Principal Problem:   Cellulitis and abscess of left lower extremity Active Problems:   Malignant neoplasm of prostate (HCC)   Multiple myeloma (HCC)   Pulmonary embolus, left (HCC)   Pancytopenia due to chemotherapy ([place])     #1 cellulitis of the left lower extremity: Patient will be admitted.  He is immunocompromised so we will have combination of IV vancomycin and cefepime.  Probably metronidazole as well.  We will do the sepsis protocol for now.  Monitor closely.  follow blood cultures and sensitivities.  #2 sepsis: Secondary to #1 above.  Continue  #3 prostate cancer: Continue per oncology.  #4 multiple myeloma: On chemotherapy.  Continue  #5 history of pulmonary embolism: On apixaban.  Continue   DVT prophylaxis: Xarelto Code Status: Full code Family Communication: Wife and son in the room Disposition Plan: Home Consults called: None Admission status: Inpatient  Severity of Illness: The appropriate patient status for this patient is INPATIENT. Inpatient status is judged to be reasonable and necessary in order to provide the required intensity of service to ensure the patient's safety. The patient's presenting symptoms, physical exam findings, and initial radiographic and laboratory data in the context of their chronic comorbidities is felt to place them at high risk for further clinical deterioration. Furthermore, it is not anticipated that the patient will be medically stable for discharge from the hospital within 2 midnights of admission.  The following factors support the patient status of inpatient.   " The patient's presenting symptoms include fever chills left lower extremity cellulitis. " The worrisome physical exam findings include left lower leg redness and swelling. " The initial radiographic and laboratory data are worrisome because of bicytopenia. " The chronic co-morbidities include multiple myeloma.   * I certify that at the point of admission it is my clinical judgment that the  patient will require inpatient hospital care spanning beyond 2 midnights from the point of admission due to high intensity of service, high risk for further deterioration and high frequency of surveillance required.Barbette Merino MD [facility] Pager [phone]  If 7PM-7AM, please contact night-coverage [email or web address] Password [facility]  07/02/2019, 7:14 PM

## 2019-07-02 NOTE — ED Notes (Signed)
Date and time results received: 07/02/19 1959  Test: Lactic Acid  Critical Value: 3.2  Name of Provider Notified: Jonelle Sidle, Md

## 2019-07-02 NOTE — Progress Notes (Signed)
PHARMACY NOTE -  Cefepime  Pharmacy has been assisting with dosing of cefepime for sepsis.  Dosage remains stable at 2g IV q8 hr and need for further dosage adjustment appears unlikely at present given SCr at baseline  Pharmacy will sign off, following peripherally for culture results or dose adjustments. Please reconsult if a change in clinical status warrants re-evaluation of dosage.  Reuel Boom, PharmD, BCPS [phone] 07/02/2019, 7:23 PM

## 2019-07-02 NOTE — ED Provider Notes (Signed)
[facility] DEPT Provider Note   CSN: [id number] Arrival date & time: 07/02/19  1501     History Chief Complaint  Patient presents with  . Recurrent Skin Infections    Mitchell Lowery is a 85 y.o. male history of multiple myeloma, here presenting with chills and left leg pain.  Patient states that around 1 PM, he noticed subjective chills.  Also noticed worsening redness and swelling in the left leg.  Patient states that he is on Xarelto.  He also had no recent injury but had a remote injury.  He states that he had similar symptoms previously and finished a course of Keflex.  Recently received chemotherapy 3 days ago for his multiple myeloma.   The history is provided by the patient.       Past Medical History:  Diagnosis Date  . Anal fistula    treated in [place]  . Anemia    only due to myeloma- "normal now"  . Heart murmur   . Hemorrhoids   . Multiple myeloma (HCC)    1 month remission now-last tx. 1 month ago- /[name]  . Prostate cancer [facility])    urinary retention, surgery planned-prior radiation, and seed implant about 1 year ago.  . Prostatitis   . Pulmonary embolus, left ([place]) 03/28/2016  . UTI (lower urinary tract infection)    multiple urinary trract infection- being tx presently with antibiotic at present.    Patient Active Problem List   Diagnosis Date Noted  . Goals of care, counseling/discussion 03/28/2019  . 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 Sensorineural hearing loss (SNHL) of both ears 11/11/2017  . 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 Pancytopenia due to chemotherapy ([place]) 10/08/2016  . Anticoagulated 04/08/2016  . Pulmonary embolus, left ([place]) 03/28/2016  . Fracture of other specified skull and facial bones, right side, sequela ([place]) 01/01/2016  . Subarachnoid hemorrhage following injury ([place]) 10/25/2015  . Loss of consciousness ([place]) 10/25/2015  . Multiple myeloma in  relapse ([place]) 09/21/2015  . Infection of urinary tract 01/09/2015  . Multiple myeloma ([place]) 08/15/2014  . Malignant neoplasm of prostate ([place]) 03/10/2014    Past Surgical History:  Procedure Laterality Date  . ANAL FISTULECTOMY    . ANKLE SURGERY    . CYSTOSCOPY    . CYSTOSCOPY WITH INSERTION OF UROLIFT N/A 03/12/2015   Procedure: CYSTOSCOPY WITH INSERTION OF UROLIFT;  Surgeon: Franchot Gallo, MD;  Location: [facility];  Service: Urology;  Laterality: N/A;  . PROSTATE BIOPSY    . PROSTATE BIOPSY    . RADIOACTIVE SEED IMPLANT N/A 07/21/2014   Procedure: RADIOACTIVE SEED IMPLANT/BRACHYTHERAPY IMPLANT;  Surgeon: Franchot Gallo, MD;  Location: [facility];  Service: Urology;  Laterality: N/A;  . TONSILLECTOMY         Family History  Problem Relation Age of Onset  . Cancer Maternal Grandfather        mouth/throat ca    Social History   Tobacco Use  . Smoking status: Never Smoker  . Smokeless tobacco: Never Used  Substance Use Topics  . Alcohol use: Yes    Comment: 1 glass wine daily  . Drug use: No    Home Medications Prior to Admission medications   Medication Sig Start Date End Date Taking? Authorizing Provider  Cholecalciferol 2000 units TABS Take 1 tablet by mouth daily.    [provider]  diphenoxylate-atropine (LOMOTIL) 2.5-0.025 MG tablet Take 1 tablet by mouth daily as needed for  diarrhea or loose stools. Usually takes 1 tablet a week 03/11/16   [provider]  losartan (COZAAR) 50 MG tablet Take 50 mg by mouth daily.     [provider]  Psyllium (QC NATURAL VEGETABLE) 95 % POWD Take by mouth.    [provider]  sennosides-docusate sodium (SENOKOT-S) 8.6-50 MG tablet Take 1 tablet by mouth as needed for constipation.    [provider]  valACYclovir (VALTREX) 500 MG tablet Take 500 mg by mouth daily.  12/03/17   [provider]  vitamin B-12 (CYANOCOBALAMIN) 500 MCG tablet Take 500 mcg by mouth  daily.    [provider]  XARELTO 20 MG TABS tablet TAKE 1 TABLET BY MOUTH DAILY WITH SUPPER. 03/25/19   Ladell Pier, MD    Allergies    Patient has no known allergies.  Review of Systems   Review of Systems  Skin: Positive for color change.  All other systems reviewed and are negative.   Physical Exam Updated Vital Signs BP 136/71   Pulse (!) 103   Temp 100.2 F (37.9 C) (Oral)   Resp 18   Ht '5\' 11"'  (1.803 m)   Wt 80.8 kg   SpO2 92%   BMI 24.84 kg/m   Physical Exam Vitals and nursing note reviewed.  HENT:     Head: Normocephalic.     Nose: Nose normal.     Mouth/Throat:     Mouth: Mucous membranes are moist.  Eyes:     Extraocular Movements: Extraocular movements intact.     Pupils: Pupils are equal, round, and reactive to light.  Cardiovascular:     Rate and Rhythm: Normal rate and regular rhythm.     Pulses: Normal pulses.     Heart sounds: Normal heart sounds.  Pulmonary:     Effort: Pulmonary effort is normal.     Breath sounds: Normal breath sounds.  Abdominal:     General: Abdomen is flat.     Palpations: Abdomen is soft.  Musculoskeletal:     Cervical back: Normal range of motion.     Comments: Left calf with some after ecchymosis.  There is surrounding cellulitis in the leg appears warm.  He does have 2+ DP pulses.  Patient gradual to wiggle toes and has normal capillary refill.  No calf tenderness.  Skin:    General: Skin is warm.     Capillary Refill: Capillary refill takes less than 2 seconds.     Findings: Erythema present.  Neurological:     General: No focal deficit present.     Mental Status: He is alert and oriented to person, place, and time.  Psychiatric:        Mood and Affect: Mood normal.        Behavior: Behavior normal.     ED Results / Procedures / Treatments   Labs (all labs ordered are listed, but only abnormal results are displayed) Labs Reviewed  CBC WITH DIFFERENTIAL/PLATELET - Abnormal; Notable for the  following components:      Result Value   WBC 3.6 (*)    RDW 16.2 (*)    Platelets 36 (*)    Lymphs Abs 0.1 (*)    All other components within normal limits  COMPREHENSIVE METABOLIC PANEL - Abnormal; Notable for the following components:   Glucose, Bld 111 (*)    Total Protein 6.2 (*)    All other components within normal limits  LACTIC ACID, PLASMA - Abnormal; Notable for  the following components:   Lactic Acid, Venous 2.7 (*)    All other components within normal limits  CULTURE, BLOOD (ROUTINE X 2)  CULTURE, BLOOD (ROUTINE X 2)  SARS CORONAVIRUS 2 BY RT PCR (HOSPITAL ORDER, [facility])  PROTIME-INR  LACTIC ACID, PLASMA    EKG None  Radiology No results found.  Procedures Procedures (including critical care time)  CRITICAL CARE Performed by: Wandra Arthurs   Total critical care time: 30 minutes  Critical care time was exclusive of separately billable procedures and treating other patients.  Critical care was necessary to treat or prevent imminent or life-threatening deterioration.  Critical care was time spent personally by me on the following activities: development of treatment plan with patient and/or surrogate as well as nursing, discussions with consultants, evaluation of patient's response to treatment, examination of patient, obtaining history from patient or surrogate, ordering and performing treatments and interventions, ordering and review of laboratory studies, ordering and review of radiographic studies, pulse oximetry and re-evaluation of patient's condition.   Medications Ordered in ED Medications  vancomycin (VANCOCIN) IVPB 1000 mg/200 mL premix (1,000 mg Intravenous New Bag/Given 07/02/19 1755)  sodium chloride 0.9 % bolus 1,000 mL (0 mLs Intravenous Stopped 07/02/19 1836)  piperacillin-tazobactam (ZOSYN) IVPB 3.375 g (0 g Intravenous Stopped 07/02/19 1837)  acetaminophen (TYLENOL) tablet 1,000 mg (1,000 mg Oral Given 07/02/19 1722)   sodium chloride 0.9 % bolus 1,000 mL (1,000 mLs Intravenous New Bag/Given 07/02/19 1835)    ED Course  I have reviewed the triage vital signs and the nursing notes.  Pertinent labs & imaging results that were available during my care of the patient were reviewed by me and considered in my medical decision making (see chart for details).    MDM Rules/Calculators/A&P                      Mitchell Lowery is a 85 y.o. male who presented with left leg cellulitis and fever .  Patient is febrile 1029 in the ED and borderline tachycardic.  Consider sepsis from cellulitis.  Will get CBC, CMP, lactate, cultures. Patient has no cough or urinary symptoms.  Will hydrate patient and give empiric IV antibiotics and reassess.  6:40 PM WBC is 3.6. However, lactate is 2.7. Given IVF and vanc and zosyn. Will admit for sepsis from cellulitis.   Final Clinical Impression(s) / ED Diagnoses Final diagnoses:  None    Rx / DC Orders ED Discharge Orders    None       Drenda Freeze, MD 07/02/19 1840

## 2019-07-02 NOTE — ED Triage Notes (Signed)
Patient reports that he had a left leg injury 5 years ago. He routinely gets cellulitis to affected area now. Today after 1 pm a large red/purple area developed on left shin area. Pain says pain is 5/10. Patient on xarelto and is currently a cancer patient with last infusion on Thursday this week.

## 2019-07-02 NOTE — ED Notes (Signed)
Date and time results received: 07/02/19 1816    Test: lactic acid Critical Value: 2.7  Name of Provider Notified: Darl Householder

## 2019-07-03 ENCOUNTER — Other Ambulatory Visit: Payer: Self-pay

## 2019-07-03 ENCOUNTER — Encounter (HOSPITAL_COMMUNITY): Payer: Self-pay | Admitting: Internal Medicine

## 2019-07-03 DIAGNOSIS — B9689 Other specified bacterial agents as the cause of diseases classified elsewhere: Secondary | ICD-10-CM

## 2019-07-03 DIAGNOSIS — R7881 Bacteremia: Secondary | ICD-10-CM | POA: Diagnosis present

## 2019-07-03 DIAGNOSIS — C9000 Multiple myeloma not having achieved remission: Secondary | ICD-10-CM

## 2019-07-03 DIAGNOSIS — D696 Thrombocytopenia, unspecified: Secondary | ICD-10-CM | POA: Diagnosis present

## 2019-07-03 DIAGNOSIS — C61 Malignant neoplasm of prostate: Secondary | ICD-10-CM

## 2019-07-03 LAB — BLOOD CULTURE ID PANEL (REFLEXED)

## 2019-07-03 LAB — URINALYSIS, ROUTINE W REFLEX MICROSCOPIC
Bacteria, UA: NONE SEEN
Bilirubin Urine: NEGATIVE
Glucose, UA: NEGATIVE mg/dL
Ketones, ur: NEGATIVE mg/dL
Leukocytes,Ua: NEGATIVE
Nitrite: NEGATIVE
Protein, ur: NEGATIVE mg/dL
Specific Gravity, Urine: 1.013 (ref 1.005–1.030)
pH: 7 (ref 5.0–8.0)

## 2019-07-03 LAB — CBC
HCT: 36.5 % — ABNORMAL LOW (ref 39.0–52.0)
Hemoglobin: 11.8 g/dL — ABNORMAL LOW (ref 13.0–17.0)
MCH: 31.5 pg (ref 26.0–34.0)
MCHC: 32.3 g/dL (ref 30.0–36.0)
MCV: 97.3 fL (ref 80.0–100.0)
Platelets: 22 10*3/uL — CL (ref 150–400)
RBC: 3.75 MIL/uL — ABNORMAL LOW (ref 4.22–5.81)
RDW: 16.3 % — ABNORMAL HIGH (ref 11.5–15.5)
WBC: 5 10*3/uL (ref 4.0–10.5)
nRBC: 0.4 % — ABNORMAL HIGH (ref 0.0–0.2)

## 2019-07-03 LAB — BASIC METABOLIC PANEL
Anion gap: 10 (ref 5–15)
BUN: 13 mg/dL (ref 8–23)
CO2: 23 mmol/L (ref 22–32)
Calcium: 8 mg/dL — ABNORMAL LOW (ref 8.9–10.3)
Chloride: 109 mmol/L (ref 98–111)
Creatinine, Ser: 0.72 mg/dL (ref 0.61–1.24)
GFR calc Af Amer: >60 mL/min (ref >60)
GFR calc non Af Amer: >60 mL/min (ref >60)
Glucose, Bld: 106 mg/dL — ABNORMAL HIGH (ref 70–99)
Potassium: 3.6 mmol/L (ref 3.5–5.1)
Sodium: 142 mmol/L (ref 135–145)

## 2019-07-03 LAB — HEMOGLOBIN AND HEMATOCRIT, BLOOD
HCT: 38.1 % — ABNORMAL LOW (ref 39.0–52.0)
Hemoglobin: 12.6 g/dL — ABNORMAL LOW (ref 13.0–17.0)

## 2019-07-03 LAB — LACTIC ACID, PLASMA: Lactic Acid, Venous: 1.4 mmol/L (ref 0.5–1.9)

## 2019-07-03 LAB — PROTIME-INR
INR: 1.3 — ABNORMAL HIGH (ref 0.8–1.2)
Prothrombin Time: 15.6 seconds — ABNORMAL HIGH (ref 11.4–15.2)

## 2019-07-03 LAB — PROCALCITONIN
Procalcitonin: 4.62 ng/mL
Procalcitonin: 4.41 ng/mL

## 2019-07-03 LAB — TYPE AND SCREEN
ABO/RH(D): O POS
Antibody Screen: POSITIVE

## 2019-07-03 LAB — CORTISOL-AM, BLOOD: Cortisol - AM: 20.3 ug/dL (ref 6.7–22.6)

## 2019-07-03 MED ORDER — ENSURE ENLIVE PO LIQD
237.0000 mL | Freq: Two times a day (BID) | ORAL | Status: DC
  Administered 2019-07-04: 237 mL via ORAL

## 2019-07-03 MED ORDER — VANCOMYCIN HCL 1500 MG/300ML IV SOLN
1500.0000 mg | INTRAVENOUS | Status: DC
  Filled 2019-07-03: qty 300

## 2019-07-03 MED ORDER — ZOLPIDEM TARTRATE 5 MG PO TABS
5.0000 mg | ORAL_TABLET | Freq: Every day | ORAL | Status: DC
  Administered 2019-07-03 – 2019-07-04 (×2): 5 mg via ORAL
  Filled 2019-07-03 (×2): qty 1

## 2019-07-03 MED ORDER — SODIUM CHLORIDE 0.9% IV SOLUTION: Freq: Once | INTRAVENOUS | Status: DC

## 2019-07-03 MED ORDER — SODIUM CHLORIDE 0.9 % IV SOLN
2.0000 g | Freq: Three times a day (TID) | INTRAVENOUS | Status: DC
  Administered 2019-07-03 – 2019-07-04 (×3): 2 g via INTRAVENOUS
  Filled 2019-07-03 (×4): qty 2

## 2019-07-03 NOTE — ED Notes (Signed)
Secondary IV established/ right hand/ 20g, blood product to be given.

## 2019-07-03 NOTE — Progress Notes (Signed)
Pharmacy Antibiotic Note  Mitchell Lowery is a 85 y.o. male admitted on 07/02/2019 with sepsis.  Pharmacy has been consulted for Vancomycin dosing.  LLE cellulitis, hx of Multiple Myeloma -chemotherapy, last dose 6/3  Tmax 100.2, WBC 3.6 > 5, SCr 0.72  Plan: 6/5 Vancomycin 1gm given at 1800, and another Vanc 1gm at 2230 - both doses in ED (autoverified) Follow with Vancomycin 1589m q24 Cefepime 2gm q8  Flagyl 5082mIV q8  PTA Valacyclovir 50047mo qd continued   Height: '5\' 11"'  (180.3 cm) Weight: 80.8 kg (178 lb 1.6 oz) IBW/kg (Calculated) : 75.3  Temp (24hrs), Avg:100.5 F (38.1 C), Min:98.5 F (36.9 C), Max:102.9 F (39.4 C)  Recent Labs  Lab 06/29/19 1307 07/02/19 1609 07/02/19 1649 07/02/19 1840 07/03/19 0345  WBC 4.8 3.6*  --   --  5.0  CREATININE 0.76 0.72  --   --   --   LATICACIDVEN  --   --  2.7* 3.2*  --     Estimated Creatinine Clearance: 74.5 mL/min (by C-G formula based on SCr of 0.72 mg/dL).    No Known Allergies  Antimicrobials this admission: 6/5 Zosyn x1 6/5 Flagyl >>  6/5 Vancomycin >>  6/5 Cefepime >> Valacyclovir  Dose adjustments this admission:  Microbiology results: 6/5 BCx: sent  Thank you for allowing pharmacy to be a part of this patient's care.  GreMinda DittoarmD 07/03/2019 8:04 AM

## 2019-07-03 NOTE — ED Notes (Signed)
Contacted blood bank regarding platelets.

## 2019-07-03 NOTE — Progress Notes (Signed)
PHARMACY - PHYSICIAN COMMUNICATION CRITICAL VALUE ALERT - BLOOD CULTURE IDENTIFICATION (BCID)  Mitchell Lowery is an 85 y.o. male who presented to [facility] on 07/02/2019 with a chief complaint of sepsis  Assessment: BCID called - 3/4 bottles unidentified GNR  Name of physician (or Provider) Contacted: Dr Tana [place]  Current antibiotics: Antimicrobials this admission: 6/5 Zosyn x1 6/5 Flagyl >>  6/5 Vancomycin >>  6/5 Cefepime >> Valacyclovir  Changes to prescribed antibiotics recommended: none   Results for orders placed or performed during the hospital encounter of 07/02/19  Blood Culture ID Panel (Reflexed) (Collected: 07/02/2019  4:49 PM)  Result Value Ref Range   Enterococcus species NOT DETECTED NOT DETECTED   Listeria monocytogenes NOT DETECTED NOT DETECTED   Staphylococcus species NOT DETECTED NOT DETECTED   Staphylococcus aureus (BCID) NOT DETECTED NOT DETECTED   Streptococcus species NOT DETECTED NOT DETECTED   Streptococcus agalactiae NOT DETECTED NOT DETECTED   Streptococcus pneumoniae NOT DETECTED NOT DETECTED   Streptococcus pyogenes NOT DETECTED NOT DETECTED   Acinetobacter baumannii NOT DETECTED NOT DETECTED   Enterobacteriaceae species NOT DETECTED NOT DETECTED   Enterobacter cloacae complex NOT DETECTED NOT DETECTED   Escherichia coli NOT DETECTED NOT DETECTED   Klebsiella oxytoca NOT DETECTED NOT DETECTED   Klebsiella pneumoniae NOT DETECTED NOT DETECTED   Proteus species NOT DETECTED NOT DETECTED   Serratia marcescens NOT DETECTED NOT DETECTED   Haemophilus influenzae NOT DETECTED NOT DETECTED   Neisseria meningitidis NOT DETECTED NOT DETECTED   Pseudomonas aeruginosa NOT DETECTED NOT DETECTED   Candida albicans NOT DETECTED NOT DETECTED   Candida glabrata NOT DETECTED NOT DETECTED   Candida krusei NOT DETECTED NOT DETECTED   Candida parapsilosis NOT DETECTED NOT DETECTED   Candida tropicalis NOT DETECTED NOT DETECTED    Minda Ditto PharmD 07/03/2019   9:13 AM

## 2019-07-03 NOTE — ED Notes (Signed)
Attempted to call report. Receiving nurse will call back.  

## 2019-07-03 NOTE — Consult Note (Signed)
[facility] for Infectious Disease    Date of Admission:  07/02/2019           Day vancomycin        Day cefepime        Day metronidazole       Reason for Consult: Recurrent left leg cellulitis and gram-negative rod bacteremia    Referring Provider: Dr. Marijo Sanes  Assessment: He has recurrent left leg cellulitis and is improving on empiric therapy.  Gram-negative rods are an unusual cause of cellulitis but I am sure that the bacteremia is a result of the cellulitis.  I will continue cefepime and metronidazole pending final blood culture results.  Plan: 1. Continue cefepime and metronidazole 2. Discontinue vancomycin 3. Await final blood culture results  Principal Problem:   Cellulitis and abscess of left lower extremity Active Problems:   Gram-negative bacteremia   Malignant neoplasm of prostate (HCC)   Multiple myeloma (HCC)   Pulmonary embolus, left (HCC)   Pancytopenia due to chemotherapy (HCC)   Thrombocytopenia (HCC)   Scheduled Meds: . sodium chloride   Intravenous Once  . cholecalciferol  1,000 Units Oral QPM  . losartan  50 mg Oral Daily  . psyllium  1 packet Oral Daily  . valACYclovir  500 mg Oral Daily  . vitamin B-12  500 mcg Oral Daily  . zolpidem  5 mg Oral QHS   Continuous Infusions: . ceFEPime (MAXIPIME) IV    . metronidazole 500 mg (07/03/19 1522)  . vancomycin     PRN Meds:.acetaminophen **OR** acetaminophen, diphenoxylate-atropine, ondansetron **OR** ondansetron (ZOFRAN) IV, senna-docusate  HPI: Mitchell Lowery is a 85 y.o. male with a history of a minor injury to his left leg while boarding a bus in [place] in 2017.  He developed left leg cellulitis and was successfully treated with oral cephalexin.  He was left with a chronic dark lesion on the lateral aspect of his left lower leg. He has chronic lymphedema of his legs, left greater than right.  Yesterday he had very sudden onset of fever, chills and sweats followed by a sending  redness and pain of his left lower leg.  There is a little bit of serous fluid leaking from the dark lesion on his leg.  He was admitted yesterday and started on broad empiric antibiotic therapy.  He is feeling much better today.  He has not noted any extension of the cellulitis and indeed feels like it has regressed slightly.  Admission blood cultures are growing gram-negative rods in both aerobic and anaerobic bottles.  BC ID did not identify a specific organism.  He is currently undergoing chemotherapy for multiple myeloma.  He is not neutropenic.   Review of Systems: Review of Systems  Constitutional: Positive for chills, diaphoresis, fever and malaise/fatigue.  Gastrointestinal: Negative for abdominal pain, diarrhea, nausea and vomiting.  Musculoskeletal: Positive for joint pain.    Past Medical History:  Diagnosis Date  . Anal fistula    treated in [place]  . Anemia    only due to myeloma- "normal now"  . Heart murmur   . Hemorrhoids   . Multiple myeloma (HCC)    1 month remission now-last tx. 1 month ago- /[name]  . Prostate cancer [facility])    urinary retention, surgery planned-prior radiation, and seed implant about 1 year ago.  . Prostatitis   . Pulmonary embolus, left ([place]) 03/28/2016  . UTI (lower urinary tract infection)  multiple urinary trract infection- being tx presently with antibiotic at present.    Social History   Tobacco Use  . Smoking status: Never Smoker  . Smokeless tobacco: Never Used  Substance Use Topics  . Alcohol use: Yes    Comment: 1 glass wine daily  . Drug use: No    Family History  Problem Relation Age of Onset  . Cancer Maternal Grandfather        mouth/throat ca   No Known Allergies  OBJECTIVE: Blood pressure (!) 158/79, pulse 71, temperature 99.4 F (37.4 C), temperature source Oral, resp. rate 18, height _0  (1.803 m), weight 80.8 kg, SpO2 96 %.  Physical Exam Constitutional:      Comments: He is very pleasant and resting  quietly in bed.  Cardiovascular:     Rate and Rhythm: Normal rate and regular rhythm.     Heart sounds: Murmur present.     Comments: He has a 1/6 systolic murmur. Pulmonary:     Effort: Pulmonary effort is normal.     Breath sounds: Normal breath sounds.  Abdominal:     Palpations: Abdomen is soft.     Tenderness: There is no abdominal tenderness.  Skin:    Comments: He has splotchy erythema and ecchymoses from his left ankle to his upper shin.  There is a dime sized area of punctate scabs on the lateral calf.  There is no purulent drainage.  He has thickened toenails and dry skin.  Psychiatric:        Mood and Affect: Mood normal.     Lab Results Lab Results  Component Value Date   WBC 5.0 07/03/2019   HGB 11.8 (L) 07/03/2019   HCT 36.5 (L) 07/03/2019   MCV 97.3 07/03/2019   PLT 22 (LL) 07/03/2019    Lab Results  Component Value Date   CREATININE 0.72 07/03/2019   BUN 13 07/03/2019   NA 142 07/03/2019   K 3.6 07/03/2019   CL 109 07/03/2019   CO2 23 07/03/2019    Lab Results  Component Value Date   ALT 18 07/02/2019   AST 23 07/02/2019   ALKPHOS 39 07/02/2019   BILITOT 1.1 07/02/2019     Microbiology: Recent Results (from the past 240 hour(s))  Blood culture (routine x 2)     Status: None (Preliminary result)   Collection Time: 07/02/19  4:49 PM   Specimen: Left Antecubital; Blood  Result Value Ref Range Status   Specimen Description   Final    LEFT ANTECUBITAL Performed at [facility], [place] [address] [place], [place] [address]    Special Requests   Final    BOTTLES DRAWN AEROBIC AND ANAEROBIC Blood Culture adequate volume Performed at [facility] [address] [place], [place] [address]    Culture  Setup Time   Final    GRAM NEGATIVE RODS IN BOTH AEROBIC AND ANAEROBIC BOTTLES Organism ID to follow CRITICAL RESULT CALLED TO, READ BACK BY AND VERIFIED WITH: L. Wynona Canes PHARMD, AT 0177 07/03/19 BY Rush Landmark Performed at [facility], [place] [address] [place], [place] [address]    Culture GRAM NEGATIVE RODS  Final   Report Status PENDING  Incomplete  Blood Culture ID Panel (Reflexed)     Status: None   Collection Time: 07/02/19  4:49 PM  Result Value Ref Range Status   Enterococcus species NOT DETECTED NOT DETECTED Final   Listeria monocytogenes NOT DETECTED NOT DETECTED Final  Staphylococcus species NOT DETECTED NOT DETECTED Final   Staphylococcus aureus (BCID) NOT DETECTED NOT DETECTED Final   Streptococcus species NOT DETECTED NOT DETECTED Final   Streptococcus agalactiae NOT DETECTED NOT DETECTED Final   Streptococcus pneumoniae NOT DETECTED NOT DETECTED Final   Streptococcus pyogenes NOT DETECTED NOT DETECTED Final   Acinetobacter baumannii NOT DETECTED NOT DETECTED Final   Enterobacteriaceae species NOT DETECTED NOT DETECTED Final   Enterobacter cloacae complex NOT DETECTED NOT DETECTED Final   Escherichia coli NOT DETECTED NOT DETECTED Final   Klebsiella oxytoca NOT DETECTED NOT DETECTED Final   Klebsiella pneumoniae NOT DETECTED NOT DETECTED Final   Proteus species NOT DETECTED NOT DETECTED Final   Serratia marcescens NOT DETECTED NOT DETECTED Final   Haemophilus influenzae NOT DETECTED NOT DETECTED Final   Neisseria meningitidis NOT DETECTED NOT DETECTED Final   Pseudomonas aeruginosa NOT DETECTED NOT DETECTED Final   Candida albicans NOT DETECTED NOT DETECTED Final   Candida glabrata NOT DETECTED NOT DETECTED Final   Candida krusei NOT DETECTED NOT DETECTED Final   Candida parapsilosis NOT DETECTED NOT DETECTED Final   Candida tropicalis NOT DETECTED NOT DETECTED Final    Comment: Performed at [facility], [place] [address], [place], [place] [address]  Blood culture (routine x 2)     Status: None (Preliminary result)   Collection Time: 07/02/19  4:54 PM   Specimen: Right Antecubital; Blood  Result Value Ref Range Status   Specimen Description   Final     RIGHT ANTECUBITAL Performed at [facility] [address] [place], [place] [address]    Special Requests   Final    BOTTLES DRAWN AEROBIC AND ANAEROBIC Blood Culture results may not be optimal due to an inadequate volume of blood received in culture bottles Performed at [facility] [address], [place], [place] [address]    Culture  Setup Time   Final    GRAM NEGATIVE RODS AEROBIC BOTTLE ONLY Performed at [facility], [place] [address] [place], [place] [address]    Culture GRAM NEGATIVE RODS  Final   Report Status PENDING  Incomplete  SARS Coronavirus 2 by RT PCR (hospital order, performed in [facility] hospital lab) Nasopharyngeal Nasopharyngeal Swab     Status: None   Collection Time: 07/02/19  6:23 PM   Specimen: Nasopharyngeal Swab  Result Value Ref Range Status   SARS Coronavirus 2 NEGATIVE NEGATIVE Final    Comment: (NOTE) SARS-CoV-2 target nucleic acids are NOT DETECTED. The SARS-CoV-2 RNA is generally detectable in upper and lower respiratory specimens during the acute phase of infection. The lowest concentration of SARS-CoV-2 viral copies this assay can detect is 250 copies / mL. A negative result does not preclude SARS-CoV-2 infection and should not be used as the sole basis for treatment or other patient management decisions.  A negative result may occur with improper specimen collection / handling, submission of specimen other than nasopharyngeal swab, presence of viral mutation(s) within the areas targeted by this assay, and inadequate number of viral copies (<250 copies / mL). A negative result must be combined with clinical observations, patient history, and epidemiological information. Fact Sheet for Patients:   StrictlyIdeas.no Fact Sheet for Healthcare Providers: BankingDealers.co.za This test is not yet approved or cleared  by the [place] FDA and has been  authorized for detection and/or diagnosis of SARS-CoV-2 by FDA under an Emergency Use Authorization (EUA).  This EUA will remain in effect (meaning this  test can be used) for the duration of the COVID-19 declaration under Section 564(b)(1) of the Act, 21 U.S.C. section 360bbb-3(b)(1), unless the authorization is terminated or revoked sooner. Performed at [facility], [place] [address] [place], [place] [address]     Michel Bickers, [facility] [phone] pager   [phone] cell 07/03/2019, 5:25 PM

## 2019-07-03 NOTE — ED Notes (Addendum)
Date and time results received: 07/03/19 4:20 AM Test: Platelets Critical Value: 22.0  Name of Provider Notified: T. Opyd  Orders Received? Or Actions Taken?:

## 2019-07-03 NOTE — ED Notes (Signed)
Patient has a positive antibody screen. This RN was made aware to contact the blood bank in advance if the patient is to receive blood.

## 2019-07-03 NOTE — Progress Notes (Signed)
[facility]                                                                              Patient Demographics  Mitchell Lowery, is a 85 y.o. male, DOB - [birth year], FUX:[id number]  Admit date - 07/02/2019   Admitting Physician [name], MD  Outpatient Primary MD for the patient is [name], MD  Outpatient specialists:   LOS - 1  days   Medical records reviewed and are as summarized below:  Primary oncologist: [name]   Chief Complaint  Patient presents with   Recurrent Skin Infections       Brief summary   Patient is 85 year old male with history of multiple myeloma on 2 weekly chemotherapy,   history of left PE (03/2016 )on Xarelto, history of pancytopenia secondary to chemotherapy, history of prostate CA presented to ED with sudden onset of fevers, chills, redness in the left lower extremity above his ankle.  Patient had a small opening wound like a carbuncle in the area and infection progressively since spread in 24 hours prior to admission.  Patient had fever up to 102 F with chills.  He denied any nausea or vomiting, diarrhea, abdominal pain or dysuria, hematuria In ED, 1002.9 F, pulse 104, respiratory rate 19, BP 142/75, O2 sats 93% on room air Lactic acid 2.7, procalcitonin 4.62  Assessment & Plan    Principal Problem: Sepsis secondary to cellulitis left lower extremity, gram-negative bacteremia -In the setting of ongoing chemotherapy, immunocompromised -Blood cultures positive for gram-negative bacteremia however BCID is negative for typical GNR bacteria.  Discussed with [name], ID, will await recommendations - Repeat lactic acid, procalcitonin, blood cultures -For now continue vancomycin, cefepime, Flagyl -Patient reported no abdominal symptoms or urinary symptoms however UA and urine culture ordered to rule out any UTI.  LFTs are normal on admission. -Per patient, family left lower extremity cellulitis slightly better today,  continue pain control, elevate leg   Active Problems:   Pancytopenia due to chemotherapy [facility]), acute on chronic thrombocytopenia -Platelets 100 K on 07/02/2019, on admission 36-> down to 22K today -On Xarelto for history of PE, thrombocytopenia worsened likely due to sepsis, gram-negative bacteremia, antibiotics, chemotherapy (carfilzomib).  Discussed with heme oncology on-call, [name], typically daratumumab does not cause thrombocytopenia -Recommended 1 unit platelet pheresis, hold Xarelto for today, last dose 6/5 PM -Per [name], if platelets significantly improving tomorrow, may resume Xarelto    Malignant neoplasm of prostate ([place]) multiple myeloma -Patient follows Dr. [name] outpatient, oncology consulted, Dr. [name] will follow in a.m.    Pulmonary embolus, left ([name]) -Diagnosed in 03/2016, on Xarelto, holding xarelto for today due to severe thrombocytopenia   Essential hypertension, history of paroxysmal A. fib -Rate controlled, not on any rate controlling meds -  continue BP control, on losartan - On Xarelto, currently on hold for today  Code Status: Full CODE STATUS DVT Prophylaxis: Received Xarelto on 6/5 PM, holding today Family Communication: Discussed all imaging results, lab results, explained to the patient, patient's wife and son-in-law, Dr. [name]   Disposition Plan:     Status is: Inpatient  Remains inpatient appropriate  because:Inpatient level of care appropriate due to severity of illness, sepsis, gram-negative bacteremia, cellulitis receiving IV antibiotics, pancytopenia with thrombocytopenia, receiving platelet pheresis    Dispo: The patient is from: Home              Anticipated d/c is to: Home              Anticipated d/c date is: 2 days              Patient currently is not medically stable to d/c.   Time Spent in minutes   35 minutes  Procedures:  None  Consultants:   Oncology, [name] Infectious disease, [name]  Antimicrobials:    Anti-infectives (From admission, onward)   Start     Dose/Rate Route Frequency Ordered Stop   07/03/19 2200  vancomycin (VANCOREADY) IVPB 1500 mg/300 mL     1,500 mg 150 mL/hr over 120 Minutes Intravenous Every 24 hours 07/03/19 0823     07/02/19 2200  ceFEPIme (MAXIPIME) 2 g in sodium chloride 0.9 % 100 mL IVPB     2 g 200 mL/hr over 30 Minutes Intravenous Every 8 hours 07/02/19 1921     07/02/19 2115  valACYclovir (VALTREX) tablet 500 mg     500 mg Oral Daily 07/02/19 2107     07/02/19 2115  ceFEPIme (MAXIPIME) 2 g in sodium chloride 0.9 % 100 mL IVPB  Status:  Discontinued     2 g 200 mL/hr over 30 Minutes Intravenous  Once 07/02/19 2107 07/02/19 2146   07/02/19 2115  metroNIDAZOLE (FLAGYL) IVPB 500 mg     500 mg 100 mL/hr over 60 Minutes Intravenous Every 8 hours 07/02/19 2107     07/02/19 2115  vancomycin (VANCOCIN) IVPB 1000 mg/200 mL premix     1,000 mg 200 mL/hr over 60 Minutes Intravenous  Once 07/02/19 2107 07/02/19 2324   07/02/19 1700  vancomycin (VANCOCIN) IVPB 1000 mg/200 mL premix     1,000 mg 200 mL/hr over 60 Minutes Intravenous  Once 07/02/19 1648 07/02/19 1855   07/02/19 1700  piperacillin-tazobactam (ZOSYN) IVPB 3.375 g     3.375 g 100 mL/hr over 30 Minutes Intravenous  Once 07/02/19 1648 07/02/19 1837   07/02/19 1645  ceFAZolin (ANCEF) IVPB 1 g/50 mL premix  Status:  Discontinued     1 g 100 mL/hr over 30 Minutes Intravenous  Once 07/02/19 1640 07/02/19 1648         Medications  Scheduled Meds:  sodium chloride   Intravenous Once   cholecalciferol  1,000 Units Oral QPM   losartan  50 mg Oral Daily   psyllium  1 packet Oral Daily   valACYclovir  500 mg Oral Daily   vitamin B-12  500 mcg Oral Daily   zolpidem  5 mg Oral QHS   Continuous Infusions:  ceFEPime (MAXIPIME) IV Stopped (07/03/19 5993)   metronidazole Stopped (07/03/19 0814)   vancomycin     PRN Meds:.acetaminophen **OR** acetaminophen, diphenoxylate-atropine, ondansetron  **OR** ondansetron (ZOFRAN) IV, senna-docusate      Subjective:   [name] was seen and examined today.  Afebrile this morning, no acute complaints, feels left lower extremity swelling and redness slightly improving today.  Patient denies dizziness, chest pain, shortness of breath, abdominal pain, N/V or diarrhea.  No urinary symptoms.  Objective:   Vitals:   07/03/19 0730 07/03/19 0830 07/03/19 0900 07/03/19 1030  BP: (!) 149/71 (!) 144/76 (!) 144/71 (!) 145/72  Pulse: 74 72 75 69  Resp:      Temp:      TempSrc:      SpO2: 93% 92% 94% 94%  Weight:      Height:        Intake/Output Summary (Last 24 hours) at 07/03/2019 1115 Last data filed at 07/02/2019 2324 Gross per 24 hour  Intake 2650 ml  Output --  Net 2650 ml     Wt Readings from Last 3 Encounters:  07/02/19 80.8 kg  06/29/19 80.8 kg  06/15/19 80.1 kg     Exam  General: Alert and oriented x 3, NAD, pleasant  Cardiovascular: S1 S2 auscultated, no murmurs, RRR  Respiratory: Clear to auscultation bilaterally, no wheezing, rales or rhonchi  Gastrointestinal: Soft, nontender, nondistended, + bowel sounds  Ext: no pedal edema bilaterally  Neuro: No new deficits  Musculoskeletal: No digital cyanosis, clubbing  Skin: Left lower extremity warm, erythematous (see below)  Psych: Normal affect and demeanor, alert and oriented x3        Data Reviewed:  I have personally reviewed following labs and imaging studies  Micro Results Recent Results (from the past 240 hour(s))  Blood culture (routine x 2)     Status: None (Preliminary result)   Collection Time: 07/02/19  4:49 PM   Specimen: Left Antecubital; Blood  Result Value Ref Range Status   Specimen Description   Final    LEFT ANTECUBITAL Performed at [facility], [place] [address] [place], [place] [address]    Special Requests   Final    BOTTLES DRAWN AEROBIC AND ANAEROBIC Blood Culture adequate volume Performed at [place] [address] [place], [place] [address]    Culture  Setup Time   Final    GRAM NEGATIVE RODS IN BOTH AEROBIC AND ANAEROBIC BOTTLES Organism ID to follow CRITICAL RESULT CALLED TO, READ BACK BY AND VERIFIED WITH: L. Wynona Canes PHARMD, AT 5009 07/03/19 BY Rush Landmark Performed at [facility], [place] [address] [place], [place] [address]    Culture GRAM NEGATIVE RODS  Final   Report Status PENDING  Incomplete  Blood Culture ID Panel (Reflexed)     Status: None   Collection Time: 07/02/19  4:49 PM  Result Value Ref Range Status   Enterococcus species NOT DETECTED NOT DETECTED Final   Listeria monocytogenes NOT DETECTED NOT DETECTED Final   Staphylococcus species NOT DETECTED NOT DETECTED Final   Staphylococcus aureus (BCID) NOT DETECTED NOT DETECTED Final   Streptococcus species NOT DETECTED NOT DETECTED Final   Streptococcus agalactiae NOT DETECTED NOT DETECTED Final   Streptococcus pneumoniae NOT DETECTED NOT DETECTED Final   Streptococcus pyogenes NOT DETECTED NOT DETECTED Final   Acinetobacter baumannii NOT DETECTED NOT DETECTED Final   Enterobacteriaceae species NOT DETECTED NOT DETECTED Final   Enterobacter cloacae complex NOT DETECTED NOT DETECTED Final   Escherichia coli NOT DETECTED NOT DETECTED Final   Klebsiella oxytoca NOT DETECTED NOT DETECTED Final   Klebsiella pneumoniae NOT DETECTED NOT DETECTED Final   Proteus species NOT DETECTED NOT DETECTED Final   Serratia marcescens NOT DETECTED NOT DETECTED Final   Haemophilus influenzae NOT DETECTED NOT DETECTED Final   Neisseria meningitidis NOT DETECTED NOT DETECTED Final   Pseudomonas aeruginosa NOT DETECTED NOT DETECTED Final   Candida albicans NOT DETECTED NOT DETECTED Final   Candida glabrata NOT DETECTED NOT DETECTED Final   Candida krusei NOT DETECTED NOT DETECTED Final   Candida parapsilosis NOT DETECTED NOT DETECTED Final   Candida  tropicalis NOT DETECTED NOT DETECTED Final    Comment:  Performed at [facility], [place] [address], [place], [place] [address]  Blood culture (routine x 2)     Status: None (Preliminary result)   Collection Time: 07/02/19  4:54 PM   Specimen: Right Antecubital; Blood  Result Value Ref Range Status   Specimen Description   Final    RIGHT ANTECUBITAL Performed at [place] [address] [place], [place] [address]    Special Requests   Final    BOTTLES DRAWN AEROBIC AND ANAEROBIC Blood Culture results may not be optimal due to an inadequate volume of blood received in culture bottles Performed at [place] [address] [place], [place] [address]    Culture  Setup Time   Final    GRAM NEGATIVE RODS AEROBIC BOTTLE ONLY Performed at [facility], [place] [address], [place], [place] [address]    Culture GRAM NEGATIVE RODS  Final   Report Status PENDING  Incomplete  SARS Coronavirus 2 by RT PCR (hospital order, performed in [facility] hospital lab) Nasopharyngeal Nasopharyngeal Swab     Status: None   Collection Time: 07/02/19  6:23 PM   Specimen: Nasopharyngeal Swab  Result Value Ref Range Status   SARS Coronavirus 2 NEGATIVE NEGATIVE Final    Comment: (NOTE) SARS-CoV-2 target nucleic acids are NOT DETECTED. The SARS-CoV-2 RNA is generally detectable in upper and lower respiratory specimens during the acute phase of infection. The lowest concentration of SARS-CoV-2 viral copies this assay can detect is 250 copies / mL. A negative result does not preclude SARS-CoV-2 infection and should not be used as the sole basis for treatment or other patient management decisions.  A negative result may occur with improper specimen collection / handling, submission of specimen other than nasopharyngeal swab, presence of viral mutation(s) within the areas targeted by this assay, and inadequate number of viral copies (<250 copies / mL). A negative result must be combined with clinical observations,  patient history, and epidemiological information. Fact Sheet for Patients:   StrictlyIdeas.no Fact Sheet for Healthcare Providers: BankingDealers.co.za This test is not yet approved or cleared  by the [place] FDA and has been authorized for detection and/or diagnosis of SARS-CoV-2 by FDA under an Emergency Use Authorization (EUA).  This EUA will remain in effect (meaning this test can be used) for the duration of the COVID-19 declaration under Section 564(b)(1) of the Act, 21 U.S.C. section 360bbb-3(b)(1), unless the authorization is terminated or revoked sooner. Performed at [facility], [place] [address] [place], [place] [address]     Radiology Reports No results found.  Lab Data:  CBC: Recent Labs  Lab 06/29/19 1307 07/02/19 1609 07/03/19 0345  WBC 4.8 3.6* 5.0  NEUTROABS 3.5 3.3  --   HGB 13.9 13.8 11.8*  HCT 42.4 42.9 36.5*  MCV 94.6 96.8 97.3  PLT 100* 36* 22*   Basic Metabolic Panel: Recent Labs  Lab 06/29/19 1307 07/02/19 1609 07/03/19 0811  NA 142 141 142  K 4.0 4.0 3.6  CL 106 107 109  CO2 '27 27 23  ' GLUCOSE 89 111* 106*  BUN '8 18 13  ' CREATININE 0.76 0.72 0.72  CALCIUM 9.1 8.9 8.0*   GFR: Estimated Creatinine Clearance: 74.5 mL/min (by C-G formula based on SCr of 0.72 mg/dL). Liver Function Tests: Recent Labs  Lab 06/29/19 1307 07/02/19 1609  AST 18 23  ALT 14 18  ALKPHOS 46  39  BILITOT 0.7 1.1  PROT 5.9* 6.2*  ALBUMIN 3.5 3.7   No results for input(s): LIPASE, AMYLASE in the last 168 hours. No results for input(s): AMMONIA in the last 168 hours. Coagulation Profile: Recent Labs  Lab 07/02/19 1638 07/03/19 0345  INR 1.1 1.3*   Cardiac Enzymes: No results for input(s): CKTOTAL, CKMB, CKMBINDEX, TROPONINI in the last 168 hours. BNP (last 3 results) No results for input(s): PROBNP in the last 8760 hours. HbA1C: No results for input(s): HGBA1C in the last 72  hours. CBG: No results for input(s): GLUCAP in the last 168 hours. Lipid Profile: No results for input(s): CHOL, HDL, LDLCALC, TRIG, CHOLHDL, LDLDIRECT in the last 72 hours. Thyroid Function Tests: No results for input(s): TSH, T4TOTAL, FREET4, T3FREE, THYROIDAB in the last 72 hours. Anemia Panel: No results for input(s): VITAMINB12, FOLATE, FERRITIN, TIBC, IRON, RETICCTPCT in the last 72 hours. Urine analysis:    Component Value Date/Time   COLORURINE AMBER (A) 06/24/2015 1729   APPEARANCEUR CLEAR 06/24/2015 1729   LABSPEC 1.022 06/24/2015 1729   LABSPEC 1.020 01/09/2015 0917   PHURINE 7.5 06/24/2015 1729   GLUCOSEU NEGATIVE 06/24/2015 1729   GLUCOSEU Negative 01/09/2015 0917   HGBUR TRACE (A) 06/24/2015 1729   BILIRUBINUR NEGATIVE 06/24/2015 1729   BILIRUBINUR Negative 01/09/2015 0917   KETONESUR NEGATIVE 06/24/2015 1729   PROTEINUR 30 (A) 06/24/2015 1729   UROBILINOGEN 0.2 01/09/2015 0917   NITRITE NEGATIVE 06/24/2015 1729   LEUKOCYTESUR NEGATIVE 06/24/2015 1729   LEUKOCYTESUR Small 01/09/2015 0917     Rayshawn Maney M.D. [facility] 07/03/2019, 11:15 AM   Call night coverage person covering after 7pm

## 2019-07-04 DIAGNOSIS — I2699 Other pulmonary embolism without acute cor pulmonale: Secondary | ICD-10-CM

## 2019-07-04 DIAGNOSIS — R6520 Severe sepsis without septic shock: Secondary | ICD-10-CM

## 2019-07-04 DIAGNOSIS — D61810 Antineoplastic chemotherapy induced pancytopenia: Secondary | ICD-10-CM

## 2019-07-04 DIAGNOSIS — L02416 Cutaneous abscess of left lower limb: Secondary | ICD-10-CM

## 2019-07-04 DIAGNOSIS — L03116 Cellulitis of left lower limb: Secondary | ICD-10-CM

## 2019-07-04 DIAGNOSIS — A419 Sepsis, unspecified organism: Secondary | ICD-10-CM

## 2019-07-04 DIAGNOSIS — D696 Thrombocytopenia, unspecified: Secondary | ICD-10-CM

## 2019-07-04 LAB — PREPARE PLATELET PHERESIS: Unit division: 0

## 2019-07-04 LAB — BASIC METABOLIC PANEL
Anion gap: 10 (ref 5–15)
BUN: 12 mg/dL (ref 8–23)
CO2: 21 mmol/L — ABNORMAL LOW (ref 22–32)
Calcium: 7.7 mg/dL — ABNORMAL LOW (ref 8.9–10.3)
Chloride: 105 mmol/L (ref 98–111)
Creatinine, Ser: 0.7 mg/dL (ref 0.61–1.24)
GFR calc Af Amer: >60 mL/min (ref >60)
GFR calc non Af Amer: >60 mL/min (ref >60)
Glucose, Bld: 100 mg/dL — ABNORMAL HIGH (ref 70–99)
Potassium: 2.9 mmol/L — ABNORMAL LOW (ref 3.5–5.1)
Sodium: 136 mmol/L (ref 135–145)

## 2019-07-04 LAB — URINE CULTURE: Culture: NO GROWTH

## 2019-07-04 LAB — CBC WITH DIFFERENTIAL/PLATELET
Abs Immature Granulocytes: 0.04 10*3/uL (ref 0.00–0.07)
Basophils Absolute: 0 10*3/uL (ref 0.0–0.1)
Basophils Relative: 0 %
Eosinophils Absolute: 0 10*3/uL (ref 0.0–0.5)
Eosinophils Relative: 0 %
HCT: 38.6 % — ABNORMAL LOW (ref 39.0–52.0)
Hemoglobin: 12.7 g/dL — ABNORMAL LOW (ref 13.0–17.0)
Immature Granulocytes: 1 %
Lymphocytes Relative: 3 %
Lymphs Abs: 0.2 10*3/uL — ABNORMAL LOW (ref 0.7–4.0)
MCH: 30.8 pg (ref 26.0–34.0)
MCHC: 32.9 g/dL (ref 30.0–36.0)
MCV: 93.5 fL (ref 80.0–100.0)
Monocytes Absolute: 0.5 10*3/uL (ref 0.1–1.0)
Monocytes Relative: 8 %
Neutro Abs: 6.1 10*3/uL (ref 1.7–7.7)
Neutrophils Relative %: 88 %
Platelets: 30 10*3/uL — ABNORMAL LOW (ref 150–400)
RBC: 4.13 MIL/uL — ABNORMAL LOW (ref 4.22–5.81)
RDW: 15.9 % — ABNORMAL HIGH (ref 11.5–15.5)
WBC: 6.9 10*3/uL (ref 4.0–10.5)
nRBC: 0 % (ref 0.0–0.2)

## 2019-07-04 LAB — TYPE AND SCREEN
ABO/RH(D): O POS
Antibody Screen: POSITIVE

## 2019-07-04 LAB — BPAM PLATELET PHERESIS
Blood Product Expiration Date: 202106082359
ISSUE DATE / TIME: 202106061420
Unit Type and Rh: 6200

## 2019-07-04 LAB — POTASSIUM: Potassium: 3.6 mmol/L (ref 3.5–5.1)

## 2019-07-04 MED ORDER — SODIUM CHLORIDE 0.9 % IV SOLN
2.0000 g | INTRAVENOUS | Status: DC
  Administered 2019-07-04: 2 g via INTRAVENOUS
  Filled 2019-07-04: qty 2
  Filled 2019-07-04: qty 20

## 2019-07-04 MED ORDER — POTASSIUM CHLORIDE 10 MEQ/100ML IV SOLN
10.0000 meq | INTRAVENOUS | Status: AC
  Administered 2019-07-04 (×2): 10 meq via INTRAVENOUS
  Filled 2019-07-04 (×3): qty 100

## 2019-07-04 MED ORDER — POTASSIUM CHLORIDE 20 MEQ PO PACK
40.0000 meq | PACK | Freq: Once | ORAL | Status: AC
  Administered 2019-07-04: 40 meq via ORAL
  Filled 2019-07-04: qty 2

## 2019-07-04 NOTE — Progress Notes (Signed)
Initial Nutrition Assessment  DOCUMENTATION CODES:   Not applicable  INTERVENTION:  - continue Ensure Enlive BID, each supplement provides 350 kcal and 20 grams of protein.  NUTRITION DIAGNOSIS:   Increased nutrient needs related to chronic illness, cancer and cancer related treatments, acute illness(sepsis) as evidenced by estimated needs.  GOAL:   Patient will meet greater than or equal to 90% of their needs  MONITOR:   PO intake, Supplement acceptance, Labs, Weight trends  REASON FOR ASSESSMENT:   Malnutrition Screening Tool  ASSESSMENT:   85 y.o. male with medical history significant of cellulitis of the lower extremity, prostate cancer, multiple myeloma on chemotherapy with last chemotherapy 3 days PTA, and hx of pulmonary embolism on chronic anticoagulation. He presented to the ED with sudden onset of fever, chills, and LLE redness. Patient was admitted for sepsis 2/2 cellulitis.  Patient consumed Ensure which was provided to him this morning. Patient was moved from 4W to 4E earlier today. He was seen by a RD at the [facility] during infusion on 06/15/19. At that time, patient reported good appetite and eating 3 meals/day. He was encouraged to drink 1-2 nutrition shakes/day.   Patient reports that his appetite continued to be good until a few days PTA when he began to have a fever, chills, and overall feeling unwell. He denies N/V/D or abdominal pain during that time. He has not been having any issues with taste alterations.   Per chart review, weight on 6/5 was 178 lb, weight on 5/4 was 179 lb, and weight on 4/21 was 181 lb. This indicates stable weight over the past 1.5 months.   Per notes: - sepsis 2/2 LLE cellulitis - pancytopenia d/t chemo  - prostate cancer and multiple myeloma on chemo   Labs reviewed; K: 2.9 mmol/l, Ca: 7.7 mg/dl. Medications reviewed; 1000 units cholecalciferol/day, 40 mEq Klor-Con x1 dose 6/7, 10 mEq IV KCl x2 runs 6/7, 1 packet  metamucil/day, 500 mcg oral cyanocobalamin/day.    NUTRITION - FOCUSED PHYSICAL EXAM:  completed; no muscle or fat wasting, mild edema noted to LLE.   Diet Order:   Diet Order            Diet Heart Room service appropriate? Yes; Fluid consistency: Thin  Diet effective now              EDUCATION NEEDS:   No education needs have been identified at this time  Skin:  Skin Assessment: Reviewed RN Assessment  Last BM:  6/4  Height:   Ht Readings from Last 1 Encounters:  07/02/19 5' 11" (1.803 m)    Weight:   Wt Readings from Last 1 Encounters:  07/02/19 80.8 kg    Estimated Nutritional Needs:  Kcal:  2100-2300 kcal Protein:  105-120 grams Fluid:  >/= 2.2 L/day     Jarome Matin, MS, RD, LDN, CNSC Inpatient Clinical Dietitian RD pager # available in [facility]  After hours/weekend pager # available in [facility]

## 2019-07-04 NOTE — TOC Initial Note (Signed)
Transition of Care [facility]) - Initial/Assessment Note    Patient Details  Name: Mitchell Lowery MRN: [id number] Date of Birth: 06-17-1934  Transition of [facility]) CM/SW Contact:    Dessa Phi, RN Phone Number: 07/04/2019, 3:26 PM  Clinical Narrative:Patient already goes to otpt PT but can't recall the name-MD pleae provide a manual script for patient for HC:[id number] & strenghtening for otpt PT.                    Expected Discharge Plan: OP Rehab Barriers to Discharge: Continued Medical Work up   Patient Goals and CMS Choice Patient states their goals for this hospitalization and ongoing recovery are:: go home CMS Medicare.gov Compare Post Acute Care list provided to:: Patient Choice offered to / list presented to : Patient  Expected Discharge Plan and Services Expected Discharge Plan: OP Rehab   Discharge Planning Services: CM Consult   Living arrangements for the past 2 months: Single Family Home                                      Prior Living Arrangements/Services Living arrangements for the past 2 months: Single Family Home Lives with:: Spouse Patient language and need for interpreter reviewed:: Yes Do you feel safe going back to the place where you live?: Yes      Need for Family Participation in Patient Care: No (Comment) Care giver support system in place?: Yes (comment)   Criminal Activity/Legal Involvement Pertinent to Current Situation/Hospitalization: No - Comment as needed  Activities of Daily Living Home Assistive Devices/Equipment: Cane (specify quad or straight) ADL Screening (condition at time of admission) Patient's cognitive ability adequate to safely complete daily activities?: Yes Is the patient deaf or have difficulty hearing?: Yes Does the patient have difficulty seeing, even when wearing glasses/contacts?: No Does the patient have difficulty concentrating, remembering, or making decisions?: No Patient able to express need for  assistance with ADLs?: Yes Does the patient have difficulty dressing or bathing?: No Independently performs ADLs?: Yes (appropriate for developmental age) Does the patient have difficulty walking or climbing stairs?: No Weakness of Legs: Both Weakness of Arms/Hands: None  Permission Sought/Granted Permission sought to share information with : Case Manager Permission granted to share information with : Yes, Verbal Permission Granted  Share Information with NAME: Case Manager           Emotional Assessment Appearance:: Appears stated age Attitude/Demeanor/Rapport: Gracious Affect (typically observed): Accepting Orientation: : Oriented to Self, Oriented to Place, Oriented to  Time, Oriented to Situation Alcohol / Substance Use: Not Applicable Psych Involvement: No (comment)  Admission diagnosis:  Cellulitis of left lower extremity [L03.116] Cellulitis and abscess of left lower extremity [L03.116, L02.416] Sepsis, due to unspecified organism, unspecified whether acute organ dysfunction present [facility]) [A41.9] Patient Active Problem List   Diagnosis Date Noted  . Sepsis ([place])   . Gram-negative bacteremia 07/03/2019  . Thrombocytopenia ([place]) 07/03/2019  . Cellulitis of left lower extremity 07/02/2019  . Goals of care, counseling/discussion 03/28/2019  . 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 Sensorineural hearing loss (SNHL) of both ears 11/11/2017  . 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 Pancytopenia due to chemotherapy ([place]) 10/08/2016  . Anticoagulated 04/08/2016  . Pulmonary embolus, left ([place]) 03/28/2016  . Fracture of other specified skull and facial bones, right side, sequela ([place]) 01/01/2016  . Subarachnoid hemorrhage following injury ([place]) 10/25/2015  .  Loss of consciousness ([place]) 10/25/2015  . Multiple myeloma in relapse ([place]) 09/21/2015  . Infection of urinary tract 01/09/2015  . Multiple myeloma ([place]) 08/15/2014  . Malignant  neoplasm of prostate ([place]) 03/10/2014   PCP:  [name], MD Pharmacy:   [place], [place] [address] Phone: [phone] Fax: [phone]  [place], [place] [address] [place] [address] Phone: [phone] Fax: [phone]  [place], [place] [address] Phone: [phone] Fax: [phone]     Social Determinants of Health (SDOH) Interventions    Readmission Risk Interventions No flowsheet data found.

## 2019-07-04 NOTE — Progress Notes (Addendum)
HEMATOLOGY-ONCOLOGY PROGRESS NOTE  SUBJECTIVE: The patient reports that he is feeling better this morning.  He feels though his left leg is improving.  He was noted to have a low-grade fever earlier this morning, but he denies having any chills.  Denies bleeding.  Blood cultures positive for gram-negative rods.  Remains on IV antibiotics.  ID consultation has been obtained.  Repeat blood cultures obtained on 07/03/2019 are negative to date.  Oncology History  Multiple myeloma ([place])  08/15/2014 Initial Diagnosis   Multiple myeloma ([place])   04/05/2019 -  Chemotherapy   The patient had daratumumab-hyaluronidase-fihj (DARZALEX FASPRO) 1800-30000 MG-UT/15ML chemo SQ injection 1,800 mg, 1,800 mg, Subcutaneous,  Once, 3 of 7 cycles Administration: 1,800 mg (04/05/2019), 1,800 mg (04/12/2019), 1,800 mg (04/19/2019), 1,800 mg (04/27/2019), 1,800 mg (05/04/2019), 1,800 mg (05/18/2019), 1,800 mg (05/31/2019), 1,800 mg (06/15/2019), 1,800 mg (06/30/2019) carfilzomib (KYPROLIS) 40 mg in dextrose 5 % 50 mL chemo infusion, 19 mg/m2 = 42 mg, Intravenous,  Once, 3 of 7 cycles Dose modification: 36 mg/m2 (original dose 36 mg/m2, Cycle 1, Reason: Provider Judgment), 70 mg/m2 (original dose 56 mg/m2, Cycle 2, Reason: Provider Judgment), 56 mg/m2 (original dose 56 mg/m2, Cycle 2, Reason: Provider Judgment), 56 mg/m2 (original dose 56 mg/m2, Cycle 2) Administration: 40 mg (04/05/2019), 74 mg (04/12/2019), 120 mg (04/19/2019), 140 mg (05/04/2019), 120 mg (05/18/2019), 120 mg (06/15/2019), 120 mg (06/30/2019), 120 mg (05/31/2019)  for chemotherapy treatment.     PHYSICAL EXAMINATION:  Vitals:   07/04/19 0005 07/04/19 0607  BP: (!) 147/117 (!) 155/80  Pulse: (!) 59 76  Resp: 20 16  Temp: (!) 100.5 F (38.1 C) 99.8 F (37.7 C)  SpO2: 94% 94%   Filed Weights   07/02/19 1522  Weight: 80.8 kg    Intake/Output from previous day: 06/06 0701 - 06/07 0700 In: 679.7 [Blood:373.3; IV Piggyback:306.4] Out: 2050 [Urine:2050]  GENERAL:alert, no  distress and comfortable SKIN: Left lower extremity erythematous and warm to touch.  Discoloration noted. NEURO: alert & oriented x 3 with fluent speech, no focal motor/sensory deficits  LABORATORY DATA:  I have reviewed the data as listed CMP Latest Ref Rng & Units 07/04/2019 07/03/2019 07/02/2019  Glucose 70 - 99 mg/dL 100(H) 106(H) 111(H)  BUN 8 - 23 mg/dL _0 Creatinine 0.61 - 1.24 mg/dL 0.70 0.72 0.72  Sodium 135 - 145 mmol/L 136 142 141  Potassium 3.5 - 5.1 mmol/L 2.9(L) 3.6 4.0  Chloride 98 - 111 mmol/L 105 109 107  CO2 22 - 32 mmol/L 21(L) 23 27  Calcium 8.9 - 10.3 mg/dL 7.7(L) 8.0(L) 8.9  Total Protein 6.5 - 8.1 g/dL - - 6.2(L)  Total Bilirubin 0.3 - 1.2 mg/dL - - 1.1  Alkaline Phos 38 - 126 U/L - - 39  AST 15 - 41 U/L - - 23  ALT 0 - 44 U/L - - 18    Lab Results  Component Value Date   WBC 6.9 07/04/2019   HGB 12.7 (L) 07/04/2019   HCT 38.6 (L) 07/04/2019   MCV 93.5 07/04/2019   PLT 30 (L) 07/04/2019   NEUTROABS 6.1 07/04/2019    No results found.  ASSESSMENT AND PLAN: 1. Multiple myeloma-confirmed on a bone marrow biopsy 08/07/2014, IgA lambda  Serum M spike and increased serum free lambda light chains  Myeloma FISH panel negative for chromosome 4, 11, 12, 13, 14, and 17 abnormalities, cytogenetics with no metaphases  Bone survey 08/15/2014 with indeterminant lucent skull lesions  Cycle 1 RVD 08/22/2014  Cycle 2 RVD 09/19/2014  Cycle 3 RVD 10/17/2014  Cycle 4 RVD 11/14/2014 (Decadron reduced to 20 mg weekly, Revlimid 15 mg days 1 through 14, Velcade 3/4 weeks)  Cycle 5 RVD 12/12/2014  Serum M spike not detected 12/26/2014  Cycle 6 RVD 01/09/2015  Maintenance Revlimid, 10 mg daily, 02/05/2015 , discontinue 02/26/2015 secondary to leg edema and diarrhea  Revlimid resumed at a dose of 10 mg every other day beginning 03/13/2015  PET scan 22/29/7989-QJ hypermetabolic bone lesions, few lucent lesions in the spine and pelvis  Revlimid discontinued  January 2019  Enrollment on a clinical trial at [facility] with pomalidomide/Decadron+/- ixazomibbeginning 03/10/2017  Treatment discontinued February 2021 secondary to rising serum lambda light chains  PET 04/05/2019-hypermetabolic right axillary/chest wall nodes, right paravertebral mass, small right pleural effusion, retrocrural node, right lateral abdominal wall mass, L5 lesion mild compression deformity. Focus of hypermetabolism at the anterior left pelvic wall without a CT correlate. Hypermetabolic lytic lesion in the left T3 transverse process  Cycle 1 daratumumab/carfilzomib/Decadron 04/05/2019  Radiation to the right chest wall mass and lumbar spine beginning 04/25/2019  Cycle 2 daratumumab/carfilzomib/Decadron 05/04/2019 (carfilzomib dose escalated); day 8 held due to neutropenia, thrombocytopenia; day 15 05/18/2019 (carfilzomib dose reduced)  Daratumumab/carfilzomib/Decadron changed to every 2-week dosing beginning 05/25/2019  2. 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 History of intermittent prostatitis  4. Skin rash 10/24/2014-referred to dermatology, biopsy consistent with local hypersensitivity reaction  5. 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 Left lower leg cellulitis 06/24/2015-treated with Keflex  7. History of mild thrombocytopenia secondary tomyeloma and systemic therapy  8. History of mild neutropenia-secondary to myeloma and systemic therapy  9. Fall with a skull fracture and subarachnoid blood/right frontal lobe contusions 10/17/2015  10. Right lower lobe pulmonary embolus 03/28/2016. Lovenox , transitioned to Xarelto  11. Vesicular rashJune 2018-potentially related to the zoster  vaccine, resolved  12. History of atrial fibrillation  13. Aortic stenosis  14.  07/02/2019-hospital admission for sepsis secondary to cellulitis of the left lower extremity with gram-negative rod bacteremia  Mr. Messler appears stable.  Left lower extremity pain, edema, and discoloration are improving.  He still has intermittent low-grade fevers.  He remains on IV antibiotics.  Repeat blood cultures are negative to date.  He has anemia and thrombocytopenia likely due to recent chemotherapy.  He is status post 1 unit of platelets on 07/03/2019.  Platelet count is 30,000 today and he has no active bleeding.  Xarelto is currently on hold.  Recommendations: 1.  Continue IV antibiotics per ID.  Await results of repeat blood cultures. 2.  Continue to hold Xarelto.  Will plan to recheck his CBC on Wednesday and a platelet count is improving, will resume Xarelto. 3.  He is currently scheduled for outpatient follow-up on 07/13/2019.  We will keep this appointment as scheduled.   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  He is currently being treated with daratumumab and carfilzomib for multiple myeloma.  The myeloma appears to be responding.  He was last treated on 06/30/2019.  He was admitted 07/02/2019 with cellulitis and bacteremia.  He appears well today.  Blood cultures have returned positive for a gram-negative rod.  ID and sensitivity testing is pending.  [name] developed  severe thrombocytopenia in the hospital.  This is likely related to treatment of the myeloma and bacteremia.  I recommend holding Xarelto anticoagulation for now.  He will resume anticoagulation therapy if the platelet count rises over the next few days.  He will return for outpatient follow-up as scheduled with the plan to continue daratumumab/Decadron/carfilzomib.

## 2019-07-04 NOTE — Progress Notes (Signed)
CRITICAL VALUE ALERT  Critical Value:  K+ 2.9  Date & Time Notied:  07/04/19 3338  Provider Notified: Earnest Conroy  Orders Received/Actions taken:

## 2019-07-04 NOTE — Evaluation (Signed)
Physical Therapy Evaluation Patient Details Name: Mitchell Lowery MRN: [id number] DOB: Jun 15, 1934 Today's Date: 07/04/2019   History of Present Illness  Mitchell Lowery is a 85 y.o. male with a history of a minor injury to his left leg while boarding a bus in [place] in 2017.  He developed left leg cellulitis and was successfully treated with oral cephalexin.  He was left with a chronic dark lesion on the lateral aspect of his left lower leg. He has chronic lymphedema of his legs, left greater than right.  Yesterday he had very sudden onset of fever, chills and sweats followed by a sending redness and pain of his left lower leg.  He was admitted yesterday and started on broad empiric antibiotic therapy. He is currently undergoing chemotherapy for multiple myeloma.    Clinical Impression  Mitchell Lowery is 85 y.o. male admitted with above HPI and diagnosis. Patient is currently limited by functional impairments below (see PT problem list). Patient lives with his wife and is independent at baseline with use of [facility] for community mobility. Patient is very active and walks ~ 1.5 miles/day with SPC. He currently requires min guard assist for safety with gait using IV pole to steady. Patient will benefit from continued skilled PT interventions to address impairments and progress independence with mobility, recommending OPPT for strengthening and balance training. Acute PT will follow and progress as able.     Follow Up Recommendations Outpatient PT(for balance and strengthening)    Equipment Recommendations  None recommended by PT    Recommendations for Other Services       Precautions / Restrictions Precautions Precautions: Fall Restrictions Weight Bearing Restrictions: No      Mobility  Bed Mobility Overal bed mobility: Needs Assistance Bed Mobility: Supine to Sit     Supine to sit: Supervision;HOB elevated     General bed mobility comments: HOB slightly elevated, pt taking extra  time to mobilize.  Transfers Overall transfer level: Needs assistance Equipment used: None Transfers: Sit to/from [facility] Sit to Stand: Min guard;Min assist Stand pivot transfers: Min guard;Min assist       General transfer comment: cues for using IV pole after rising. pt requried close guard for safety and min assist on 1/3 stands to steady and complete power up. pt using bil UE's from recliner to rise and grab bars to rise from toilet.   Ambulation/Gait Ambulation/Gait assistance: Min guard;Min assist Gait Distance (Feet): 360 Feet Assistive device: IV Pole Gait Pattern/deviations: Step-through pattern;Decreased stride length;Decreased stance time - left;Decreased weight shift to left;Antalgic Gait velocity: decreased   General Gait Details: pt wtih slightly antalgic gait, with a hip hike on Rt for Lt foot clearance. pt with mild gait deviations however no overt LOB noted. pt required cues for use of IV pole and light assist to manage initially.  Stairs       Wheelchair Mobility    Modified Rankin (Stroke Patients Only)       Balance Overall balance assessment: Needs assistance Sitting-balance support: Feet supported Sitting balance-Leahy Scale: Good     Standing balance support: During functional activity;Bilateral upper extremity supported Standing balance-Leahy Scale: Poor            Pertinent Vitals/Pain Pain Assessment: No/denies pain    Home Living Family/patient expects to be discharged to:: Private residence Living Arrangements: Spouse/significant other Available Help at Discharge: Family Type of Home: House Home Access: Stairs to enter Entrance Stairs-Rails: Chemical engineer of Steps: 1-2 Home  Layout: One level Home Equipment: Cane - single point      Prior Function Level of Independence: Independent;Independent with assistive device(s)         Comments: pt walks ~ 1.5-2 miles daily and in the last ~2  months he has required a SPC to walk. Pt was walign 3 miles daily ~ 6 months ago for exercies and using a rowing machine.     Hand Dominance   Dominant Hand: Right    Extremity/Trunk Assessment   Upper Extremity Assessment Upper Extremity Assessment: Overall WFL for tasks assessed    Lower Extremity Assessment Lower Extremity Assessment: Overall WFL for tasks assessed    Cervical / Trunk Assessment Cervical / Trunk Assessment: Normal  Communication   Communication: No difficulties  Cognition Arousal/Alertness: Awake/alert Behavior During Therapy: WFL for tasks assessed/performed Overall Cognitive Status: Within Functional Limits for tasks assessed             General Comments      Exercises General Exercises - Lower Extremity Ankle Circles/Pumps: AROM;Both;20 reps;Seated   Assessment/Plan    PT Assessment Patient needs continued PT services  PT Problem List Decreased activity tolerance;Decreased balance;Decreased mobility;Decreased knowledge of use of DME;Decreased strength       PT Treatment Interventions DME instruction;Gait training;Stair training;Functional mobility training;Therapeutic activities;Therapeutic exercise;Balance training;Patient/family education    PT Goals (Current goals can be found in the Care Plan section)  Acute Rehab PT Goals Patient Stated Goal: get home and keep up with treatment and walking PT Goal Formulation: With patient Time For Goal Achievement: 07/18/19 Potential to Achieve Goals: Good    Frequency Min 3X/week    AM-PAC PT "6 Clicks" Mobility  Outcome Measure Help needed turning from your back to your side while in a flat bed without using bedrails?: None Help needed moving from lying on your back to sitting on the side of a flat bed without using bedrails?: None Help needed moving to and from a bed to a chair (including a wheelchair)?: A Little Help needed standing up from a chair using your arms (e.g., wheelchair or  bedside chair)?: A Little Help needed to walk in hospital room?: A Little Help needed climbing 3-5 steps with a railing? : A Little 6 Click Score: 20    End of Session Equipment Utilized During Treatment: Gait belt Activity Tolerance: Patient tolerated treatment well Patient left: in chair;with call bell/phone within reach;with chair alarm set;with family/visitor present Nurse Communication: Mobility status PT Visit Diagnosis: Unsteadiness on feet (R26.81);Difficulty in walking, not elsewhere classified (R26.2)    Time: 3009-2330 PT Time Calculation (min) (ACUTE ONLY): 34 min   Charges:   PT Evaluation $PT Eval Moderate Complexity: 1 Mod PT Treatments $Gait Training: 8-22 mins        Verner Mould, DPT Physical Therapist with [facility] [phone]  07/04/2019 3:00 PM

## 2019-07-04 NOTE — Progress Notes (Signed)
PROGRESS NOTE    Mitchell Lowery  QVZ:[id number]  DOB: [birth year]  PCP: [name], MD Admit date:07/02/2019 85 year old male with history of multiple myeloma on biweekly chemotherapy, history of left PE (03/2016 )on Xarelto, chemo related pancytopenia, history of prostate CA presented to ED with c/o fevers, chills, redness in the left lower extremity above his ankle.  Patient apparently had a small open wound like a carbuncle in the area which progressively worsened with contiguos spread in 24 hours prior to admission associated with fever up to 102F.   ED Course:  Febrile 102.9 F, pulse 104, respiratory rate 19, BP 142/75, O2 sats 93% on room air.Lactic acid 2.7, procalcitonin 4.62 Hospital course: Patient admitted to [facility] for management of cellulitis, associated sepsis with IV abx. HC complicated by positive blood cultures, uncontrolled HTN,  worsening thrombocytopenia requiring platelet transfusion on 6/6 and hypokalemia. On IV cefepime/flagyl and vancomycin  Subjective:  Patient states he is ready to go home and that ID has cleared him. Tmax 100.47F overnight. Wife at bedside  Objective: Vitals:   07/04/19 0005 07/04/19 0607 07/04/19 1031 07/04/19 1519  BP: (!) 147/117 (!) 155/80 (!) 150/79 138/74  Pulse: (!) 59 76 64 62  Resp: _0 Temp: (!) 100.5 F (38.1 C) 99.8 F (37.7 C)  98.2 F (36.8 C)  TempSrc: Oral Oral  Oral  SpO2: 94% 94%  100%  Weight:      Height:        Intake/Output Summary (Last 24 hours) at 07/04/2019 2015 Last data filed at 07/04/2019 1800 Gross per 24 hour  Intake 763.09 ml  Output 1001 ml  Net -237.91 ml   Filed Weights   07/02/19 1522  Weight: 80.8 kg    Physical Examination:  General exam: Appears calm and comfortable  Respiratory system: Clear to auscultation. Respiratory effort normal. Cardiovascular system: S1 & S2 heard, RRR. No JVD, murmurs, rubs, gallops or clicks. No pedal edema. Gastrointestinal system: Abdomen is  nondistended, soft and nontender. Normal bowel sounds heard. Central nervous system: Alert and oriented. No new focal neurological deficits. Extremities: area of redness marked along LLE , warm to touch Skin: No rashes, lesions or ulcers Psychiatry: Judgement and insight appear normal. Mood & affect anxious.   Data Reviewed: I have personally reviewed following labs and imaging studies  CBC: Recent Labs  Lab 06/29/19 1307 07/02/19 1609 07/03/19 0345 07/03/19 1823 07/04/19 0452  WBC 4.8 3.6* 5.0  --  6.9  NEUTROABS 3.5 3.3  --   --  6.1  HGB 13.9 13.8 11.8* 12.6* 12.7*  HCT 42.4 42.9 36.5* 38.1* 38.6*  MCV 94.6 96.8 97.3  --  93.5  PLT 100* 36* 22*  --  30*   Basic Metabolic Panel: Recent Labs  Lab 06/29/19 1307 07/02/19 1609 07/03/19 0811 07/04/19 0452 07/04/19 1550  NA 142 141 142 136  --   K 4.0 4.0 3.6 2.9* 3.6  CL 106 107 109 105  --   CO2 _1 21*  --   GLUCOSE 89 111* 106* 100*  --   BUN _2 --   CREATININE 0.76 0.72 0.72 0.70  --   CALCIUM 9.1 8.9 8.0* 7.7*  --    GFR: Estimated Creatinine Clearance: 74.5 mL/min (by C-G formula based on SCr of 0.7 mg/dL). Liver Function Tests: Recent Labs  Lab 06/29/19 1307 07/02/19 1609  AST 18 23  ALT 14 18  ALKPHOS 46 39  BILITOT 0.7  1.1  PROT 5.9* 6.2*  ALBUMIN 3.5 3.7   No results for input(s): LIPASE, AMYLASE in the last 168 hours. No results for input(s): AMMONIA in the last 168 hours. Coagulation Profile: Recent Labs  Lab 07/02/19 1638 07/03/19 0345  INR 1.1 1.3*   Cardiac Enzymes: No results for input(s): CKTOTAL, CKMB, CKMBINDEX, TROPONINI in the last 168 hours. BNP (last 3 results) No results for input(s): PROBNP in the last 8760 hours. HbA1C: No results for input(s): HGBA1C in the last 72 hours. CBG: No results for input(s): GLUCAP in the last 168 hours. Lipid Profile: No results for input(s): CHOL, HDL, LDLCALC, TRIG, CHOLHDL, LDLDIRECT in the last 72 hours. Thyroid Function  Tests: No results for input(s): TSH, T4TOTAL, FREET4, T3FREE, THYROIDAB in the last 72 hours. Anemia Panel: No results for input(s): VITAMINB12, FOLATE, FERRITIN, TIBC, IRON, RETICCTPCT in the last 72 hours. Sepsis Labs: Recent Labs  Lab 07/02/19 1649 07/02/19 1840 07/03/19 0345 07/03/19 1050  PROCALCITON  --   --  4.62 4.41  LATICACIDVEN 2.7* 3.2*  --  1.4    Recent Results (from the past 240 hour(s))  Blood culture (routine x 2)     Status: Abnormal (Preliminary result)   Collection Time: 07/02/19  4:49 PM   Specimen: Left Antecubital; Blood  Result Value Ref Range Status   Specimen Description   Final    LEFT ANTECUBITAL Performed at [facility], [place] [address], [place], [place] [address]    Special Requests   Final    BOTTLES DRAWN AEROBIC AND ANAEROBIC Blood Culture adequate volume Performed at [place] [address] [place], [place] [address]    Culture  Setup Time   Final    GRAM NEGATIVE RODS IN BOTH AEROBIC AND ANAEROBIC BOTTLES CRITICAL RESULT CALLED TO, READ BACK BY AND VERIFIED WITH: L. POINDEXTER PHARMD, AT 8295 07/03/19 BY D. VANHOOK    Culture (A)  Final    AEROMONAS CAVIAE SUSCEPTIBILITIES TO FOLLOW Performed at [facility], [place] [address] [place], [place] [address]    Report Status PENDING  Incomplete  Blood Culture ID Panel (Reflexed)     Status: None   Collection Time: 07/02/19  4:49 PM  Result Value Ref Range Status   Enterococcus species NOT DETECTED NOT DETECTED Final   Listeria monocytogenes NOT DETECTED NOT DETECTED Final   Staphylococcus species NOT DETECTED NOT DETECTED Final   Staphylococcus aureus (BCID) NOT DETECTED NOT DETECTED Final   Streptococcus species NOT DETECTED NOT DETECTED Final   Streptococcus agalactiae NOT DETECTED NOT DETECTED Final   Streptococcus pneumoniae NOT DETECTED NOT DETECTED Final   Streptococcus pyogenes NOT DETECTED NOT DETECTED Final   Acinetobacter baumannii  NOT DETECTED NOT DETECTED Final   Enterobacteriaceae species NOT DETECTED NOT DETECTED Final   Enterobacter cloacae complex NOT DETECTED NOT DETECTED Final   Escherichia coli NOT DETECTED NOT DETECTED Final   Klebsiella oxytoca NOT DETECTED NOT DETECTED Final   Klebsiella pneumoniae NOT DETECTED NOT DETECTED Final   Proteus species NOT DETECTED NOT DETECTED Final   Serratia marcescens NOT DETECTED NOT DETECTED Final   Haemophilus influenzae NOT DETECTED NOT DETECTED Final   Neisseria meningitidis NOT DETECTED NOT DETECTED Final   Pseudomonas aeruginosa NOT DETECTED NOT DETECTED Final   Candida albicans NOT DETECTED NOT DETECTED Final   Candida glabrata NOT DETECTED NOT DETECTED Final   Candida krusei NOT DETECTED NOT DETECTED Final   Candida parapsilosis NOT DETECTED NOT DETECTED Final  Candida tropicalis NOT DETECTED NOT DETECTED Final    Comment: Performed at [facility], [place] [address], [place], [place] [address]  Blood culture (routine x 2)     Status: Abnormal (Preliminary result)   Collection Time: 07/02/19  4:54 PM   Specimen: Right Antecubital; Blood  Result Value Ref Range Status   Specimen Description   Final    RIGHT ANTECUBITAL Performed at [facility] [address] [place], [place] [address]    Special Requests   Final    BOTTLES DRAWN AEROBIC AND ANAEROBIC Blood Culture results may not be optimal due to an inadequate volume of blood received in culture bottles Performed at [facility] [address] [place], [place] [address]    Culture  Setup Time   Final    GRAM NEGATIVE RODS AEROBIC BOTTLE ONLY CRITICAL VALUE NOTED.  VALUE IS CONSISTENT WITH PREVIOUSLY REPORTED AND CALLED VALUE. Performed at [facility], [place] [address], [place], [place] [address]    Culture AEROMONAS CAVIAE (A)  Final   Report Status PENDING  Incomplete  SARS Coronavirus 2 by RT PCR (hospital order, performed in [facility] hospital lab)  Nasopharyngeal Nasopharyngeal Swab     Status: None   Collection Time: 07/02/19  6:23 PM   Specimen: Nasopharyngeal Swab  Result Value Ref Range Status   SARS Coronavirus 2 NEGATIVE NEGATIVE Final    Comment: (NOTE) SARS-CoV-2 target nucleic acids are NOT DETECTED. The SARS-CoV-2 RNA is generally detectable in upper and lower respiratory specimens during the acute phase of infection. The lowest concentration of SARS-CoV-2 viral copies this assay can detect is 250 copies / mL. A negative result does not preclude SARS-CoV-2 infection and should not be used as the sole basis for treatment or other patient management decisions.  A negative result may occur with improper specimen collection / handling, submission of specimen other than nasopharyngeal swab, presence of viral mutation(s) within the areas targeted by this assay, and inadequate number of viral copies (<250 copies / mL). A negative result must be combined with clinical observations, patient history, and epidemiological information. Fact Sheet for Patients:   StrictlyIdeas.no Fact Sheet for Healthcare Providers: BankingDealers.co.za This test is not yet approved or cleared  by the [place] FDA and has been authorized for detection and/or diagnosis of SARS-CoV-2 by FDA under an Emergency Use Authorization (EUA).  This EUA will remain in effect (meaning this test can be used) for the duration of the COVID-19 declaration under Section 564(b)(1) of the Act, 21 U.S.C. section 360bbb-3(b)(1), unless the authorization is terminated or revoked sooner. Performed at [facility], [place] [address], [place], [place] [address]   Culture, blood (Routine X 2) w Reflex to ID Panel     Status: None (Preliminary result)   Collection Time: 07/03/19  4:24 PM   Specimen: BLOOD LEFT HAND  Result Value Ref Range Status   Specimen Description   Final    BLOOD LEFT HAND Performed  at [facility], [place] [address], [place], [place] [address]    Special Requests   Final    BOTTLES DRAWN AEROBIC AND ANAEROBIC Blood Culture adequate volume Performed at [place] [address] [place], [place] [address]    Culture   Final    NO GROWTH < 12 HOURS Performed at [facility] [address] [place], [place] [address]    Report Status PENDING  Incomplete  Culture, blood (Routine X 2) w Reflex to ID Panel     Status: None (Preliminary result)   Collection Time: 07/03/19  6:23 PM   Specimen: BLOOD  Result Value Ref Range Status   Specimen Description   Final    BLOOD RIGHT ARM Performed at [facility] [address] [place], [place] [address]    Special Requests   Final    BOTTLES DRAWN AEROBIC ONLY Blood Culture adequate volume   Culture   Final    NO GROWTH < 12 HOURS Performed at [facility], [place] [address], [place], [place] [address]    Report Status PENDING  Incomplete      Radiology Studies: No results found.      Scheduled Meds: . sodium chloride   Intravenous Once  . cholecalciferol  1,000 Units Oral QPM  . feeding supplement (ENSURE ENLIVE)  237 mL Oral BID BM  . losartan  50 mg Oral Daily  . psyllium  1 packet Oral Daily  . valACYclovir  500 mg Oral Daily  . vitamin B-12  500 mcg Oral Daily  . zolpidem  5 mg Oral QHS   Continuous Infusions: . cefTRIAXone (ROCEPHIN)  IV 200 mL/hr at 07/04/19 1800     Assessment/Plan:  Sepsis secondary to cellulitis left lower extremity, gram-negative bacteremia-In the setting of  chemotherapy, immunocompromised state. -Blood cultures positive for gram-negative bacteria however BCID is negative for typical GNR bacteria.  ID following and changed abx to IV rocephin to cover Aeromonas caviae- a ubiquitous organism which can cause necrotizing fascitis per ID , now off vancomycin, cefepime, Flagyl, will await final recommendations. Repeat lactic  acid, procalcitonin, blood cultures sent .Per patient, family left lower extremity cellulitis slightly better today, continue pain control, elevate leg  Pancytopenia due to chemotherapy [facility]), acute on chronic thrombocytopenia-Platelets 100 K on 07/02/2019, on admission 36-> down to 22K -->improved to 30k with transfusion. On Xarelto for history of PE, thrombocytopenia worsened likely due to sepsis, gram-negative bacteremia, antibiotics, chemotherapy (carfilzomib).  Discussed with heme oncology on-call, [name], typically daratumumab does not cause thrombocytopenia. S/p 1 unit platelet pheresis, continue to hold Xarelto, last dose 6/5 PM per oncology  Malignant neoplasm of prostate ([place]) multiple myeloma-Patient follows Dr. [name] outpatient, oncology consulted and following along.  Pulmonary embolus, left (HCC)-Diagnosed in 03/2016, on Xarelto, holding xarelto for now due to severe thrombocytopenia  Paroxysmal A. fib-Rate controlled, not on any rate controlling meds. On Xarelto, currently on hold   Essential hypertension: continue to titrate meds for BP control, on losartan. Can add cardizem  Hypokalemia: Replace po/IV . Labs this evening  DVT prophylaxis: Resume Xarelto when okay per hematology Code Status: Full code Family / Patient Communication: d/w patient and wife at bedside Disposition Plan:   Status is: Inpatient  Remains inpatient appropriate because:IV treatments appropriate due to intensity of illness or inability to take PO   Dispo: The patient is from: Home              Anticipated d/c is to: Home              Anticipated d/c date is: 2 days              Patient currently is not medically stable to d/c.        LOS: 2 days    Time spent:     Guilford Shi, MD [facility] Pager in [place]  If 7PM-7AM, please contact night-coverage [email or web address] 07/04/2019,  8:15 PM

## 2019-07-04 NOTE — Progress Notes (Signed)
Subjective: No new complaints   Antibiotics:  Anti-infectives (From admission, onward)   Start     Dose/Rate Route Frequency Ordered Stop   07/04/19 1800  cefTRIAXone (ROCEPHIN) 2 g in sodium chloride 0.9 % 100 mL IVPB     2 g 200 mL/hr over 30 Minutes Intravenous Every 24 hours 07/04/19 1305     07/03/19 2200  vancomycin (VANCOREADY) IVPB 1500 mg/300 mL  Status:  Discontinued     1,500 mg 150 mL/hr over 120 Minutes Intravenous Every 24 hours 07/03/19 0823 07/03/19 1735   07/03/19 1800  ceFEPIme (MAXIPIME) 2 g in sodium chloride 0.9 % 100 mL IVPB  Status:  Discontinued     2 g 200 mL/hr over 30 Minutes Intravenous Every 8 hours 07/03/19 1642 07/04/19 1305   07/02/19 2200  ceFEPIme (MAXIPIME) 2 g in sodium chloride 0.9 % 100 mL IVPB  Status:  Discontinued     2 g 200 mL/hr over 30 Minutes Intravenous Every 8 hours 07/02/19 1921 07/03/19 1642   07/02/19 2115  valACYclovir (VALTREX) tablet 500 mg     500 mg Oral Daily 07/02/19 2107     07/02/19 2115  ceFEPIme (MAXIPIME) 2 g in sodium chloride 0.9 % 100 mL IVPB  Status:  Discontinued     2 g 200 mL/hr over 30 Minutes Intravenous  Once 07/02/19 2107 07/02/19 2146   07/02/19 2115  metroNIDAZOLE (FLAGYL) IVPB 500 mg  Status:  Discontinued     500 mg 100 mL/hr over 60 Minutes Intravenous Every 8 hours 07/02/19 2107 07/04/19 1305   07/02/19 2115  vancomycin (VANCOCIN) IVPB 1000 mg/200 mL premix     1,000 mg 200 mL/hr over 60 Minutes Intravenous  Once 07/02/19 2107 07/02/19 2324   07/02/19 1700  vancomycin (VANCOCIN) IVPB 1000 mg/200 mL premix     1,000 mg 200 mL/hr over 60 Minutes Intravenous  Once 07/02/19 1648 07/02/19 1855   07/02/19 1700  piperacillin-tazobactam (ZOSYN) IVPB 3.375 g     3.375 g 100 mL/hr over 30 Minutes Intravenous  Once 07/02/19 1648 07/02/19 1837   07/02/19 1645  ceFAZolin (ANCEF) IVPB 1 g/50 mL premix  Status:  Discontinued     1 g 100 mL/hr over 30 Minutes Intravenous  Once 07/02/19 1640 07/02/19  1648      Medications: Scheduled Meds: . sodium chloride   Intravenous Once  . cholecalciferol  1,000 Units Oral QPM  . feeding supplement (ENSURE ENLIVE)  237 mL Oral BID BM  . losartan  50 mg Oral Daily  . psyllium  1 packet Oral Daily  . valACYclovir  500 mg Oral Daily  . vitamin B-12  500 mcg Oral Daily  . zolpidem  5 mg Oral QHS   Continuous Infusions: . cefTRIAXone (ROCEPHIN)  IV     PRN Meds:.acetaminophen **OR** acetaminophen, diphenoxylate-atropine, ondansetron **OR** ondansetron (ZOFRAN) IV, senna-docusate    Objective: Weight change:   Intake/Output Summary (Last 24 hours) at 07/04/2019 1410 Last data filed at 07/04/2019 0816 Gross per 24 hour  Intake 679.7 ml  Output 2250 ml  Net -1570.3 ml   Blood pressure (!) 150/79, pulse 64, temperature 99.8 F (37.7 C), temperature source Oral, resp. rate 18, height _0  (1.803 m), weight 80.8 kg, SpO2 94 %. Temp:  [98.3 F (36.8 C)-100.5 F (38.1 C)] 99.8 F (37.7 C) (06/07 0607) Pulse Rate:  [59-76] 64 (06/07 1031) Resp:  [16-20] 18 (06/07 1031) BP: (147-167)/(69-117) 150/79 (06/07 1031) SpO2:  [  93 %-96 %] 94 % (06/07 0607)  Physical Exam: General: Alert and awake, oriented x3, not in any acute distress. HEENT: anicteric sclera, EOMI CVS regular rate, normal  Chest: , no wheezing, no respiratory distress Abdomen: soft non-distended,  Extremities: no edema or deformity noted bilaterally Skin:  LLE   07/03/2019:       07/04/2019:         Neuro: nonfocal  CBC:    BMET Recent Labs    07/03/19 0811 07/04/19 0452  NA 142 136  K 3.6 2.9*  CL 109 105  CO2 23 21*  GLUCOSE 106* 100*  BUN 13 12  CREATININE 0.72 0.70  CALCIUM 8.0* 7.7*     Liver Panel  Recent Labs    07/02/19 1609  PROT 6.2*  ALBUMIN 3.7  AST 23  ALT 18  ALKPHOS 39  BILITOT 1.1       Sedimentation Rate No results for input(s): ESRSEDRATE in the last 72 hours. C-Reactive Protein No results for input(s): CRP  in the last 72 hours.  Micro Results: Recent Results (from the past 720 hour(s))  Blood culture (routine x 2)     Status: Abnormal (Preliminary result)   Collection Time: 07/02/19  4:49 PM   Specimen: Left Antecubital; Blood  Result Value Ref Range Status   Specimen Description   Final    LEFT ANTECUBITAL Performed at [facility], [place] [address] [place], [place] [address]    Special Requests   Final    BOTTLES DRAWN AEROBIC AND ANAEROBIC Blood Culture adequate volume Performed at [facility] [address], [place], [place] [address]    Culture  Setup Time   Final    GRAM NEGATIVE RODS IN BOTH AEROBIC AND ANAEROBIC BOTTLES CRITICAL RESULT CALLED TO, READ BACK BY AND VERIFIED WITH: L. POINDEXTER PHARMD, AT 1194 07/03/19 BY D. VANHOOK    Culture (A)  Final    AEROMONAS CAVIAE SUSCEPTIBILITIES TO FOLLOW Performed at [facility], [place] [address] [place], [place] [address]    Report Status PENDING  Incomplete  Blood Culture ID Panel (Reflexed)     Status: None   Collection Time: 07/02/19  4:49 PM  Result Value Ref Range Status   Enterococcus species NOT DETECTED NOT DETECTED Final   Listeria monocytogenes NOT DETECTED NOT DETECTED Final   Staphylococcus species NOT DETECTED NOT DETECTED Final   Staphylococcus aureus (BCID) NOT DETECTED NOT DETECTED Final   Streptococcus species NOT DETECTED NOT DETECTED Final   Streptococcus agalactiae NOT DETECTED NOT DETECTED Final   Streptococcus pneumoniae NOT DETECTED NOT DETECTED Final   Streptococcus pyogenes NOT DETECTED NOT DETECTED Final   Acinetobacter baumannii NOT DETECTED NOT DETECTED Final   Enterobacteriaceae species NOT DETECTED NOT DETECTED Final   Enterobacter cloacae complex NOT DETECTED NOT DETECTED Final   Escherichia coli NOT DETECTED NOT DETECTED Final   Klebsiella oxytoca NOT DETECTED NOT DETECTED Final   Klebsiella pneumoniae NOT DETECTED NOT DETECTED Final   Proteus  species NOT DETECTED NOT DETECTED Final   Serratia marcescens NOT DETECTED NOT DETECTED Final   Haemophilus influenzae NOT DETECTED NOT DETECTED Final   Neisseria meningitidis NOT DETECTED NOT DETECTED Final   Pseudomonas aeruginosa NOT DETECTED NOT DETECTED Final   Candida albicans NOT DETECTED NOT DETECTED Final   Candida glabrata NOT DETECTED NOT DETECTED Final   Candida krusei NOT DETECTED NOT DETECTED Final   Candida parapsilosis NOT DETECTED NOT DETECTED Final   Candida tropicalis  NOT DETECTED NOT DETECTED Final    Comment: Performed at [facility], [place] [address] [place], [place] [address]  Blood culture (routine x 2)     Status: Abnormal (Preliminary result)   Collection Time: 07/02/19  4:54 PM   Specimen: Right Antecubital; Blood  Result Value Ref Range Status   Specimen Description   Final    RIGHT ANTECUBITAL Performed at [facility] [address] [place], [place] [address]    Special Requests   Final    BOTTLES DRAWN AEROBIC AND ANAEROBIC Blood Culture results may not be optimal due to an inadequate volume of blood received in culture bottles Performed at [place] [address], [place], [place] [address]    Culture  Setup Time   Final    GRAM NEGATIVE RODS AEROBIC BOTTLE ONLY CRITICAL VALUE NOTED.  VALUE IS CONSISTENT WITH PREVIOUSLY REPORTED AND CALLED VALUE. Performed at [facility], [place] [address] [place], [place] [address]    Culture AEROMONAS CAVIAE (A)  Final   Report Status PENDING  Incomplete  SARS Coronavirus 2 by RT PCR (hospital order, performed in [facility] hospital lab) Nasopharyngeal Nasopharyngeal Swab     Status: None   Collection Time: 07/02/19  6:23 PM   Specimen: Nasopharyngeal Swab  Result Value Ref Range Status   SARS Coronavirus 2 NEGATIVE NEGATIVE Final    Comment: (NOTE) SARS-CoV-2 target nucleic acids are NOT DETECTED. The SARS-CoV-2 RNA is generally detectable in upper and  lower respiratory specimens during the acute phase of infection. The lowest concentration of SARS-CoV-2 viral copies this assay can detect is 250 copies / mL. A negative result does not preclude SARS-CoV-2 infection and should not be used as the sole basis for treatment or other patient management decisions.  A negative result may occur with improper specimen collection / handling, submission of specimen other than nasopharyngeal swab, presence of viral mutation(s) within the areas targeted by this assay, and inadequate number of viral copies (<250 copies / mL). A negative result must be combined with clinical observations, patient history, and epidemiological information. Fact Sheet for Patients:   StrictlyIdeas.no Fact Sheet for Healthcare Providers: BankingDealers.co.za This test is not yet approved or cleared  by the [place] FDA and has been authorized for detection and/or diagnosis of SARS-CoV-2 by FDA under an Emergency Use Authorization (EUA).  This EUA will remain in effect (meaning this test can be used) for the duration of the COVID-19 declaration under Section 564(b)(1) of the Act, 21 U.S.C. section 360bbb-3(b)(1), unless the authorization is terminated or revoked sooner. Performed at [facility], [place] [address], [place], [place] [address]   Culture, blood (Routine X 2) w Reflex to ID Panel     Status: None (Preliminary result)   Collection Time: 07/03/19  4:24 PM   Specimen: BLOOD LEFT HAND  Result Value Ref Range Status   Specimen Description   Final    BLOOD LEFT HAND Performed at [facility], [place] [address] [place], [place] [address]    Special Requests   Final    BOTTLES DRAWN AEROBIC AND ANAEROBIC Blood Culture adequate volume Performed at [place] [address] [place], [place] [address]    Culture   Final    NO GROWTH < 12 HOURS Performed at [place] [address] [place], [place] [address]    Report Status PENDING  Incomplete  Culture, blood (  Routine X 2) w Reflex to ID Panel     Status: None (Preliminary result)   Collection Time: 07/03/19  6:23 PM   Specimen: BLOOD  Result Value Ref Range Status   Specimen Description   Final    BLOOD RIGHT ARM Performed at [facility] [address] [place], [place] [address]    Special Requests   Final    BOTTLES DRAWN AEROBIC ONLY Blood Culture adequate volume   Culture   Final    NO GROWTH < 12 HOURS Performed at [facility], [place] [address] [place], [place] [address]    Report Status PENDING  Incomplete    Studies/Results: No results found.    Assessment/Plan:  INTERVAL HISTORY:   Cultures + Aeromonas Caviae   Principal Problem:   Cellulitis and abscess of left lower extremity Active Problems:   Malignant neoplasm of prostate (HCC)   Multiple myeloma (HCC)   Pulmonary embolus, left (HCC)   Pancytopenia due to chemotherapy (HCC)   Gram-negative bacteremia   Thrombocytopenia (HCC)    RAYVON DAKIN is a 85 y.o. male with prostate cancer, multiple myeloma,on chemotherapy  Admitted with severe cellulitis with aeromonas caviae bacteremia and sepsis  #1 Aeromonas caviae:  Similar to A. hyrdophila a ubiquitous organism, more common in brackish waters, lakes, but also tap water.  Can cause severe necrotizing soft tissue infections and septicemia  He does not have a specific water exposure to identify risk   This organism is MORE common in those with malignancies and I would worry about resuming his chemotherapy too soon given severity of the infection  I will narrow to Ceftriaxone for now  I will followup with the patient tomorrow and see what susceptibility pattern is antibiotics     LOS: 2 days   Alcide Evener 07/04/2019, 2:10 PM

## 2019-07-05 LAB — BASIC METABOLIC PANEL
Anion gap: 9 (ref 5–15)
BUN: 14 mg/dL (ref 8–23)
CO2: 24 mmol/L (ref 22–32)
Calcium: 8 mg/dL — ABNORMAL LOW (ref 8.9–10.3)
Chloride: 108 mmol/L (ref 98–111)
Creatinine, Ser: 0.65 mg/dL (ref 0.61–1.24)
GFR calc Af Amer: >60 mL/min (ref >60)
GFR calc non Af Amer: >60 mL/min (ref >60)
Glucose, Bld: 89 mg/dL (ref 70–99)
Potassium: 3.2 mmol/L — ABNORMAL LOW (ref 3.5–5.1)
Sodium: 141 mmol/L (ref 135–145)

## 2019-07-05 LAB — CBC WITH DIFFERENTIAL/PLATELET
Abs Immature Granulocytes: 0.03 10*3/uL (ref 0.00–0.07)
Basophils Absolute: 0 10*3/uL (ref 0.0–0.1)
Basophils Relative: 0 %
Eosinophils Absolute: 0.1 10*3/uL (ref 0.0–0.5)
Eosinophils Relative: 3 %
HCT: 37.5 % — ABNORMAL LOW (ref 39.0–52.0)
Hemoglobin: 12.4 g/dL — ABNORMAL LOW (ref 13.0–17.0)
Immature Granulocytes: 1 %
Lymphocytes Relative: 5 %
Lymphs Abs: 0.2 10*3/uL — ABNORMAL LOW (ref 0.7–4.0)
MCH: 31.3 pg (ref 26.0–34.0)
MCHC: 33.1 g/dL (ref 30.0–36.0)
MCV: 94.7 fL (ref 80.0–100.0)
Monocytes Absolute: 0.6 10*3/uL (ref 0.1–1.0)
Monocytes Relative: 15 %
Neutro Abs: 2.9 10*3/uL (ref 1.7–7.7)
Neutrophils Relative %: 76 %
Platelets: 43 10*3/uL — ABNORMAL LOW (ref 150–400)
RBC: 3.96 MIL/uL — ABNORMAL LOW (ref 4.22–5.81)
RDW: 15.7 % — ABNORMAL HIGH (ref 11.5–15.5)
WBC: 3.8 10*3/uL — ABNORMAL LOW (ref 4.0–10.5)
nRBC: 0 % (ref 0.0–0.2)

## 2019-07-05 LAB — CULTURE, BLOOD (ROUTINE X 2): Special Requests: ADEQUATE

## 2019-07-05 LAB — POTASSIUM: Potassium: 3.5 mmol/L (ref 3.5–5.1)

## 2019-07-05 MED ORDER — RIVAROXABAN 20 MG PO TABS: 20.0000 mg | ORAL_TABLET | Freq: Every day | ORAL | Status: DC

## 2019-07-05 MED ORDER — HYDROXYZINE HCL 25 MG PO TABS
25.0000 mg | ORAL_TABLET | Freq: Every evening | ORAL | 0 refills | Status: DC | PRN
Start: 2019-07-05 — End: 2019-09-21

## 2019-07-05 MED ORDER — ENSURE ENLIVE PO LIQD: 237.0000 mL | Freq: Two times a day (BID) | ORAL | 12 refills | Status: DC

## 2019-07-05 MED ORDER — CEPHALEXIN 500 MG PO CAPS: 500.0000 mg | ORAL_CAPSULE | Freq: Four times a day (QID) | ORAL | 0 refills | Status: DC

## 2019-07-05 MED ORDER — ONDANSETRON HCL 4 MG PO TABS: 4.0000 mg | ORAL_TABLET | Freq: Four times a day (QID) | ORAL | 0 refills | Status: DC | PRN

## 2019-07-05 MED ORDER — SODIUM CHLORIDE 0.9 % IV SOLN
2.0000 g | Freq: Once | INTRAVENOUS | Status: AC
  Administered 2019-07-05: 2 g via INTRAVENOUS
  Filled 2019-07-05: qty 2

## 2019-07-05 MED ORDER — POTASSIUM CHLORIDE 20 MEQ PO PACK
40.0000 meq | PACK | Freq: Once | ORAL | Status: AC
  Administered 2019-07-05: 40 meq via ORAL
  Filled 2019-07-05: qty 2

## 2019-07-05 NOTE — Progress Notes (Signed)
Went over discharge papers with patient and family.  All questions answered.  VSS.Paper prescription for PT given to patient.

## 2019-07-05 NOTE — Progress Notes (Addendum)
HEMATOLOGY-ONCOLOGY PROGRESS NOTE  SUBJECTIVE: Remains afebrile.  Blood cultures from admission positive for Aeromonas caviae.  Repeat blood cultures performed on 07/03/2019 are negative to date.  Denies bleeding this morning.  He is hoping to go home today.  Oncology History  Multiple myeloma ([place])  08/15/2014 Initial Diagnosis   Multiple myeloma ([place])   04/05/2019 -  Chemotherapy   The patient had daratumumab-hyaluronidase-fihj (DARZALEX FASPRO) 1800-30000 MG-UT/15ML chemo SQ injection 1,800 mg, 1,800 mg, Subcutaneous,  Once, 3 of 7 cycles Administration: 1,800 mg (04/05/2019), 1,800 mg (04/12/2019), 1,800 mg (04/19/2019), 1,800 mg (04/27/2019), 1,800 mg (05/04/2019), 1,800 mg (05/18/2019), 1,800 mg (05/31/2019), 1,800 mg (06/15/2019), 1,800 mg (06/30/2019) carfilzomib (KYPROLIS) 40 mg in dextrose 5 % 50 mL chemo infusion, 19 mg/m2 = 42 mg, Intravenous,  Once, 3 of 7 cycles Dose modification: 36 mg/m2 (original dose 36 mg/m2, Cycle 1, Reason: Provider Judgment), 70 mg/m2 (original dose 56 mg/m2, Cycle 2, Reason: Provider Judgment), 56 mg/m2 (original dose 56 mg/m2, Cycle 2, Reason: Provider Judgment), 56 mg/m2 (original dose 56 mg/m2, Cycle 2) Administration: 40 mg (04/05/2019), 74 mg (04/12/2019), 120 mg (04/19/2019), 140 mg (05/04/2019), 120 mg (05/18/2019), 120 mg (06/15/2019), 120 mg (06/30/2019), 120 mg (05/31/2019)  for chemotherapy treatment.     PHYSICAL EXAMINATION:  Vitals:   07/04/19 2155 07/05/19 0544  BP: 130/81 (!) 143/80  Pulse: 64 61  Resp: 20 16  Temp: 99.1 F (37.3 C) 98.4 F (36.9 C)  SpO2: 96% 95%   Filed Weights   07/02/19 1522  Weight: 80.8 kg    Intake/Output from previous day: 06/07 0701 - 06/08 0700 In: 1115.9 [P.O.:300; IV Piggyback:815.9] Out: 376 [Urine:376]  GENERAL:alert, no distress and comfortable SKIN: Left lower extremity erythematous.  Discoloration noted.  This is slowly improving. NEURO: alert & oriented x 3 with fluent speech, no focal motor/sensory  deficits  LABORATORY DATA:  I have reviewed the data as listed CMP Latest Ref Rng & Units 07/05/2019 07/04/2019 07/04/2019  Glucose 70 - 99 mg/dL 89 - 100(H)  BUN 8 - 23 mg/dL 14 - 12  Creatinine 0.61 - 1.24 mg/dL 0.65 - 0.70  Sodium 135 - 145 mmol/L 141 - 136  Potassium 3.5 - 5.1 mmol/L 3.2(L) 3.6 2.9(L)  Chloride 98 - 111 mmol/L 108 - 105  CO2 22 - 32 mmol/L 24 - 21(L)  Calcium 8.9 - 10.3 mg/dL 8.0(L) - 7.7(L)  Total Protein 6.5 - 8.1 g/dL - - -  Total Bilirubin 0.3 - 1.2 mg/dL - - -  Alkaline Phos 38 - 126 U/L - - -  AST 15 - 41 U/L - - -  ALT 0 - 44 U/L - - -    Lab Results  Component Value Date   WBC 3.8 (L) 07/05/2019   HGB 12.4 (L) 07/05/2019   HCT 37.5 (L) 07/05/2019   MCV 94.7 07/05/2019   PLT 43 (L) 07/05/2019   NEUTROABS 2.9 07/05/2019    No results found.  ASSESSMENT AND PLAN: 1. Multiple myeloma-confirmed on a bone marrow biopsy 08/07/2014, IgA lambda  Serum M spike and increased serum free lambda light chains  Myeloma FISH panel negative for chromosome 4, 11, 12, 13, 14, and 17 abnormalities, cytogenetics with no metaphases  Bone survey 08/15/2014 with indeterminant lucent skull lesions  Cycle 1 RVD 08/22/2014  Cycle 2 RVD 09/19/2014  Cycle 3 RVD 10/17/2014  Cycle 4 RVD 11/14/2014 (Decadron reduced to 20 mg weekly, Revlimid 15 mg days 1 through 14, Velcade 3/4 weeks)  Cycle 5 RVD 12/12/2014  Serum M spike not detected 12/26/2014  Cycle 6 RVD 01/09/2015  Maintenance Revlimid, 10 mg daily, 02/05/2015 , discontinue 02/26/2015 secondary to leg edema and diarrhea  Revlimid resumed at a dose of 10 mg every other day beginning 03/13/2015  PET scan 48/88/9169-IH hypermetabolic bone lesions, few lucent lesions in the spine and pelvis  Revlimid discontinued January 2019  Enrollment on a clinical trial at [facility] with pomalidomide/Decadron+/- ixazomibbeginning 03/10/2017  Treatment discontinued February 2021 secondary to rising serum lambda light  chains  PET 04/05/2019-hypermetabolic right axillary/chest wall nodes, right paravertebral mass, small right pleural effusion, retrocrural node, right lateral abdominal wall mass, L5 lesion mild compression deformity. Focus of hypermetabolism at the anterior left pelvic wall without a CT correlate. Hypermetabolic lytic lesion in the left T3 transverse process  Cycle 1 daratumumab/carfilzomib/Decadron 04/05/2019  Radiation to the right chest wall mass and lumbar spine beginning 04/25/2019  Cycle 2 daratumumab/carfilzomib/Decadron 05/04/2019 (carfilzomib dose escalated); day 8 held due to neutropenia, thrombocytopenia; day 15 05/18/2019 (carfilzomib dose reduced)  Daratumumab/carfilzomib/Decadron changed to every 2-week dosing beginning 05/25/2019  2. 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 History of intermittent prostatitis  4. Skin rash 10/24/2014-referred to dermatology, biopsy consistent with local hypersensitivity reaction  5. 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 Left lower leg cellulitis 06/24/2015-treated with Keflex  7. History of mild thrombocytopenia secondary tomyeloma and systemic therapy  8. History of mild neutropenia-secondary to myeloma and systemic therapy  9. Fall with a skull fracture and subarachnoid blood/right frontal lobe contusions 10/17/2015  10. Right lower lobe pulmonary embolus 03/28/2016. Lovenox , transitioned to Xarelto  11. Vesicular rashJune 2018-potentially related to the zoster vaccine, resolved  12. History of atrial fibrillation  13. Aortic stenosis  14.  07/02/2019-hospital admission for sepsis secondary to cellulitis of the left lower extremity with  gram-negative rod bacteremia (Aeromonas caviae)  Mitchell Lowery appears stable.  Left lower extremity pain, edema, and discoloration continue to improve.  He remains afebrile.  He remains on IV antibiotics.  Repeat blood cultures are negative to date.  He has anemia and thrombocytopenia likely due to recent chemotherapy.  His anemia is stable and thrombocytopenia is improving.  Discussed with patient that we will resume his Xarelto.  Recommend repeating his CBC tomorrow to be sure his platelets continue to improve.  If he is discharged, we can arrange for outpatient labs in our office.  Recommendations: 1.  Continue antibiotics per ID. 2.  Resume Xarelto 20 mg every evening. 3.  He is currently scheduled for outpatient follow-up on 07/13/2019.  We will keep this appointment as scheduled.  If he is discharged today, we will arrange for a repeat CBC in our office tomorrow.   LOS: 3 days   Mikey Bussing, DNP, AGPCNP-BC, AOCNP 07/05/19  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  The left leg erythema has improved.  He is afebrile.  The platelet count is higher today.  The sensitivity on the Aeromonas organism is pending.  I suspect the acute drop in his platelet count was related to bacteremia.  I recommend resuming Xarelto anticoagulation.  He will return to the cancer center as scheduled on 07/13/2019.

## 2019-07-05 NOTE — Progress Notes (Signed)
Subjective: No new complaints   Antibiotics:  Anti-infectives (From admission, onward)   Start     Dose/Rate Route Frequency Ordered Stop   07/04/19 1800  cefTRIAXone (ROCEPHIN) 2 g in sodium chloride 0.9 % 100 mL IVPB     2 g 200 mL/hr over 30 Minutes Intravenous Every 24 hours 07/04/19 1305     07/03/19 2200  vancomycin (VANCOREADY) IVPB 1500 mg/300 mL  Status:  Discontinued     1,500 mg 150 mL/hr over 120 Minutes Intravenous Every 24 hours 07/03/19 0823 07/03/19 1735   07/03/19 1800  ceFEPIme (MAXIPIME) 2 g in sodium chloride 0.9 % 100 mL IVPB  Status:  Discontinued     2 g 200 mL/hr over 30 Minutes Intravenous Every 8 hours 07/03/19 1642 07/04/19 1305   07/02/19 2200  ceFEPIme (MAXIPIME) 2 g in sodium chloride 0.9 % 100 mL IVPB  Status:  Discontinued     2 g 200 mL/hr over 30 Minutes Intravenous Every 8 hours 07/02/19 1921 07/03/19 1642   07/02/19 2115  valACYclovir (VALTREX) tablet 500 mg     500 mg Oral Daily 07/02/19 2107     07/02/19 2115  ceFEPIme (MAXIPIME) 2 g in sodium chloride 0.9 % 100 mL IVPB  Status:  Discontinued     2 g 200 mL/hr over 30 Minutes Intravenous  Once 07/02/19 2107 07/02/19 2146   07/02/19 2115  metroNIDAZOLE (FLAGYL) IVPB 500 mg  Status:  Discontinued     500 mg 100 mL/hr over 60 Minutes Intravenous Every 8 hours 07/02/19 2107 07/04/19 1305   07/02/19 2115  vancomycin (VANCOCIN) IVPB 1000 mg/200 mL premix     1,000 mg 200 mL/hr over 60 Minutes Intravenous  Once 07/02/19 2107 07/02/19 2324   07/02/19 1700  vancomycin (VANCOCIN) IVPB 1000 mg/200 mL premix     1,000 mg 200 mL/hr over 60 Minutes Intravenous  Once 07/02/19 1648 07/02/19 1855   07/02/19 1700  piperacillin-tazobactam (ZOSYN) IVPB 3.375 g     3.375 g 100 mL/hr over 30 Minutes Intravenous  Once 07/02/19 1648 07/02/19 1837   07/02/19 1645  ceFAZolin (ANCEF) IVPB 1 g/50 mL premix  Status:  Discontinued     1 g 100 mL/hr over 30 Minutes Intravenous  Once 07/02/19 1640 07/02/19  1648      Medications: Scheduled Meds: . sodium chloride   Intravenous Once  . cholecalciferol  1,000 Units Oral QPM  . feeding supplement (ENSURE ENLIVE)  237 mL Oral BID BM  . losartan  50 mg Oral Daily  . psyllium  1 packet Oral Daily  . rivaroxaban  20 mg Oral Q supper  . valACYclovir  500 mg Oral Daily  . vitamin B-12  500 mcg Oral Daily  . zolpidem  5 mg Oral QHS   Continuous Infusions: . cefTRIAXone (ROCEPHIN)  IV Stopped (07/04/19 1900)   PRN Meds:.acetaminophen **OR** acetaminophen, diphenoxylate-atropine, ondansetron **OR** ondansetron (ZOFRAN) IV, senna-docusate    Objective: Weight change:   Intake/Output Summary (Last 24 hours) at 07/05/2019 1413 Last data filed at 07/05/2019 1000 Gross per 24 hour  Intake 640.07 ml  Output 175 ml  Net 465.07 ml   Blood pressure 137/74, pulse 62, temperature 97.8 F (36.6 C), temperature source Oral, resp. rate 19, height _0  (1.803 m), weight 80.8 kg, SpO2 99 %. Temp:  [97.8 F (36.6 C)-99.1 F (37.3 C)] 97.8 F (36.6 C) (06/08 1155) Pulse Rate:  [61-64] 62 (06/08 1155) Resp:  [15-20] 19 (  06/08 1155) BP: (130-143)/(74-81) 137/74 (06/08 1155) SpO2:  [95 %-100 %] 99 % (06/08 1155)  Physical Exam: General: Alert and awake, oriented x3, not in any acute distress. HEENT: anicteric sclera, EOMI CVS regular rate, normal  Chest: , no wheezing, no respiratory distress Abdomen: soft non-distended,  Extremities: no edema or deformity noted bilaterally Skin:  LLE   07/03/2019:       07/04/2019:       Left leg 07/05/19:      Neuro: nonfocal  CBC:    BMET Recent Labs    07/04/19 0452 07/04/19 0452 07/04/19 1550 07/05/19 0615  NA 136  --   --  141  K 2.9*   < > 3.6 3.2*  CL 105  --   --  108  CO2 21*  --   --  24  GLUCOSE 100*  --   --  89  BUN 12  --   --  14  CREATININE 0.70  --   --  0.65  CALCIUM 7.7*  --   --  8.0*   < > = values in this interval not displayed.     Liver Panel  Recent  Labs    07/02/19 1609  PROT 6.2*  ALBUMIN 3.7  AST 23  ALT 18  ALKPHOS 39  BILITOT 1.1       Sedimentation Rate No results for input(s): ESRSEDRATE in the last 72 hours. C-Reactive Protein No results for input(s): CRP in the last 72 hours.  Micro Results: Recent Results (from the past 720 hour(s))  Blood culture (routine x 2)     Status: Abnormal   Collection Time: 07/02/19  4:49 PM   Specimen: Left Antecubital; Blood  Result Value Ref Range Status   Specimen Description   Final    LEFT ANTECUBITAL Performed at [facility], [place] [address] [place], [place] [address]    Special Requests   Final    BOTTLES DRAWN AEROBIC AND ANAEROBIC Blood Culture adequate volume Performed at [facility] [address] [place], [place] [address]    Culture  Setup Time   Final    GRAM NEGATIVE RODS IN BOTH AEROBIC AND ANAEROBIC BOTTLES CRITICAL RESULT CALLED TO, READ BACK BY AND VERIFIED WITH: L. Wynona Canes PHARMD, AT 9702 07/03/19 BY [place] Performed at [facility], [place] [address], [place], [place] [address]    Culture AEROMONAS CAVIAE (A)  Final   Report Status 07/05/2019 FINAL  Final   Organism ID, Bacteria AEROMONAS CAVIAE  Final      Susceptibility   Aeromonas caviae - MIC*    CEFAZOLIN 16 SENSITIVE Sensitive     CEFEPIME <=1 SENSITIVE Sensitive     CEFTAZIDIME <=1 SENSITIVE Sensitive     CIPROFLOXACIN <=0.25 SENSITIVE Sensitive     GENTAMICIN <=1 SENSITIVE Sensitive     IMIPENEM <=0.25 SENSITIVE Sensitive     TRIMETH/SULFA <=20 SENSITIVE Sensitive     PIP/TAZO <=4 SENSITIVE Sensitive     * AEROMONAS CAVIAE  Blood Culture ID Panel (Reflexed)     Status: None   Collection Time: 07/02/19  4:49 PM  Result Value Ref Range Status   Enterococcus species NOT DETECTED NOT DETECTED Final   Listeria monocytogenes NOT DETECTED NOT DETECTED Final   Staphylococcus species NOT DETECTED NOT DETECTED Final   Staphylococcus aureus (BCID)  NOT DETECTED NOT DETECTED Final   Streptococcus species NOT DETECTED NOT DETECTED Final   Streptococcus agalactiae NOT DETECTED NOT  DETECTED Final   Streptococcus pneumoniae NOT DETECTED NOT DETECTED Final   Streptococcus pyogenes NOT DETECTED NOT DETECTED Final   Acinetobacter baumannii NOT DETECTED NOT DETECTED Final   Enterobacteriaceae species NOT DETECTED NOT DETECTED Final   Enterobacter cloacae complex NOT DETECTED NOT DETECTED Final   Escherichia coli NOT DETECTED NOT DETECTED Final   Klebsiella oxytoca NOT DETECTED NOT DETECTED Final   Klebsiella pneumoniae NOT DETECTED NOT DETECTED Final   Proteus species NOT DETECTED NOT DETECTED Final   Serratia marcescens NOT DETECTED NOT DETECTED Final   Haemophilus influenzae NOT DETECTED NOT DETECTED Final   Neisseria meningitidis NOT DETECTED NOT DETECTED Final   Pseudomonas aeruginosa NOT DETECTED NOT DETECTED Final   Candida albicans NOT DETECTED NOT DETECTED Final   Candida glabrata NOT DETECTED NOT DETECTED Final   Candida krusei NOT DETECTED NOT DETECTED Final   Candida parapsilosis NOT DETECTED NOT DETECTED Final   Candida tropicalis NOT DETECTED NOT DETECTED Final    Comment: Performed at [facility], [place] [address], [place], [place] [address]  Blood culture (routine x 2)     Status: Abnormal   Collection Time: 07/02/19  4:54 PM   Specimen: Right Antecubital; Blood  Result Value Ref Range Status   Specimen Description   Final    RIGHT ANTECUBITAL Performed at [place] [address] [place], [place] [address]    Special Requests   Final    BOTTLES DRAWN AEROBIC AND ANAEROBIC Blood Culture results may not be optimal due to an inadequate volume of blood received in culture bottles Performed at [facility] [address], [place], [place] [address]    Culture  Setup Time   Final    GRAM NEGATIVE RODS AEROBIC BOTTLE ONLY CRITICAL VALUE NOTED.  VALUE IS CONSISTENT WITH  PREVIOUSLY REPORTED AND CALLED VALUE.    Culture (A)  Final    AEROMONAS CAVIAE SUSCEPTIBILITIES PERFORMED ON PREVIOUS CULTURE WITHIN THE LAST 5 DAYS. Performed at [facility], [place] [address] [place], [place] [address]    Report Status 07/05/2019 FINAL  Final  SARS Coronavirus 2 by RT PCR (hospital order, performed in [facility] hospital lab) Nasopharyngeal Nasopharyngeal Swab     Status: None   Collection Time: 07/02/19  6:23 PM   Specimen: Nasopharyngeal Swab  Result Value Ref Range Status   SARS Coronavirus 2 NEGATIVE NEGATIVE Final    Comment: (NOTE) SARS-CoV-2 target nucleic acids are NOT DETECTED. The SARS-CoV-2 RNA is generally detectable in upper and lower respiratory specimens during the acute phase of infection. The lowest concentration of SARS-CoV-2 viral copies this assay can detect is 250 copies / mL. A negative result does not preclude SARS-CoV-2 infection and should not be used as the sole basis for treatment or other patient management decisions.  A negative result may occur with improper specimen collection / handling, submission of specimen other than nasopharyngeal swab, presence of viral mutation(s) within the areas targeted by this assay, and inadequate number of viral copies (<250 copies / mL). A negative result must be combined with clinical observations, patient history, and epidemiological information. Fact Sheet for Patients:   StrictlyIdeas.no Fact Sheet for Healthcare Providers: BankingDealers.co.za This test is not yet approved or cleared  by the [place] FDA and has been authorized for detection and/or diagnosis of SARS-CoV-2 by FDA under an Emergency Use Authorization (EUA).  This EUA will remain in effect (meaning this test can be used) for the duration of  the COVID-19 declaration under Section 564(b)(1) of the Act, 21 U.S.C. section 360bbb-3(b)(1), unless the authorization is terminated  or revoked sooner. Performed at [facility], [place] [address] [place], [place] [address]   Urine Culture     Status: None   Collection Time: 07/03/19  9:25 AM   Specimen: Urine, Random  Result Value Ref Range Status   Specimen Description   Final    URINE, RANDOM Performed at [facility] [address], [place], [place] [address]    Special Requests   Final    NONE Performed at [facility], [place] [address], [place], [place] [address]    Culture   Final    NO GROWTH Performed at [facility], [place] [address] [place], [place] [address]    Report Status 07/04/2019 FINAL  Final  Culture, blood (Routine X 2) w Reflex to ID Panel     Status: None (Preliminary result)   Collection Time: 07/03/19  4:24 PM   Specimen: BLOOD LEFT HAND  Result Value Ref Range Status   Specimen Description   Final    BLOOD LEFT HAND Performed at [facility], [place] [address] [place], [place] [address]    Special Requests   Final    BOTTLES DRAWN AEROBIC AND ANAEROBIC Blood Culture adequate volume Performed at [place] [address] [place], [place] [address]    Culture   Final    NO GROWTH 2 DAYS Performed at [facility] [address] [place], [place] [address]    Report Status PENDING  Incomplete  Culture, blood (Routine X 2) w Reflex to ID Panel     Status: None (Preliminary result)   Collection Time: 07/03/19  6:23 PM   Specimen: BLOOD  Result Value Ref Range Status   Specimen Description   Final    BLOOD RIGHT ARM Performed at [facility] [address], [place], [place] [address]    Special Requests   Final    BOTTLES DRAWN AEROBIC ONLY Blood Culture adequate volume   Culture   Final    NO GROWTH 2 DAYS Performed at [facility], [place] [address] [place], [place] [address]    Report Status PENDING  Incomplete    Studies/Results: No results  found.    Assessment/Plan:  INTERVAL HISTORY:   Cultures + Aeromonas Caviae   Principal Problem:   Cellulitis of left lower extremity Active Problems:   Malignant neoplasm of prostate (HCC)   Multiple myeloma (HCC)   Pulmonary embolus, left (HCC)   Pancytopenia due to chemotherapy ([place])   Gram-negative bacteremia   Thrombocytopenia ([place])   Sepsis (HCC)    Mitchell Lowery is a 85 y.o. male with prostate cancer, multiple myeloma,on chemotherapy  Admitted with severe cellulitis with aeromonas caviae bacteremia and sepsis  #1 Aeromonas caviae:  Similar to A. hyrdophila a ubiquitous organism, more common in brackish waters, lakes, but also tap water.  Can cause severe necrotizing soft tissue infections and septicemia  He does not have a specific water exposure to identify risk   While gram negative bacteremia's can be treated with 7 days with source control, this particular type of gram negative infection has not been included in such studies.  I will give him another dose of ceftriaxone now which would be 4 days of systemic antibiotics and then he can go home on keflex 59m po QID to complete  an additional 6 days of therapy (10 in total)  I WOULD however like him to have an additional 10 days worth of keflex ON HAND with him in case his cellulitis recurs.  I think he can be safely DC today and I am happy to see him in followup in the clinic.         LOS: 3 days   Alcide Evener 07/05/2019, 2:13 PM

## 2019-07-05 NOTE — Care Management Important Message (Signed)
Important Message  Patient Details IM Letter given to Dessa Phi RN Case Manager to present to the Patient Name: Mitchell Lowery MRN: [id number] Date of Birth: 09-Aug-1934   Medicare Important Message Given:  Yes     Kerin Salen 07/05/2019, 1:30 PM

## 2019-07-05 NOTE — Discharge Summary (Signed)
Physician Discharge Summary  Mitchell Lowery PNT:[id number] DOB: 08/21/1934 DOA: 07/02/2019  PCP: [name], MD  Admit date: 07/02/2019 Discharge date: 07/05/2019 Consultations: Infectious diseases Dr Tommy Medal, Hematology/Oncology Dr Benay Spice Admitted From: home Disposition: home  Discharge Diagnoses:  Principal Problem:   Cellulitis of left lower extremity Active Problems:   Gram-negative bacteremia   Sepsis ([place])   Pancytopenia due to chemotherapy (HCC)   Thrombocytopenia ([place])   Malignant neoplasm of prostate ([place])   Multiple myeloma ([place])   Pulmonary embolus, left [facility])   Hospital Course Summary: 85 year old male with history of multiple myeloma on biweekly chemotherapy,history of left PE (03/2016)on Xarelto, chemo related pancytopenia, history of prostate CA presented to ED with c/o fevers, chills, redness in the left lower extremity above his ankle. Patient apparently had a small open wound like a carbuncle in the area which progressively worsened with contiguos spread in 24 hours prior to admission associated with fever up to 102F.  ED Course:  Febrile 102.9 F, pulse 104, respiratory rate 19, BP 142/75, O2 sats 93% on room air.Lactic acid 2.7, procalcitonin 4.62 Hospital course: Patient admitted to [facility] for management of cellulitis, associated sepsis with  IV cefepime/flagyl and vancomycin.[facility] complicated by positive blood cultures, uncontrolled HTN,  worsening thrombocytopenia requiring platelet transfusion on 6/6 and hypokalemia. Infectious disease and Hematology services were consulted.  Sepsis secondary to cellulitis left lower extremity, gram-negative bacteremia-In the setting of  chemotherapy, immunocompromised state.Blood cultures resulted positive for gram-negative bacteria however BCID was negative for typical GNR bacteria. ID consulted and changed abx to IV rocephin to cover Aeromonas caviae- a ubiquitous organism which can cause necrotizing fascitis per ID , now off  vancomycin, cefepime, Flagyl,   Repeat lactic acid level downtrended to 1.4. Repeat blood cultures from 6/6 -ve. Left lower extremity cellulitis much improved today and ID recommended another dose of rocephin prior to discharge. Recommended Keflex QID for outpatient use for atleast 7 days (10 day supply issued per ID note)  Acute on chronic thrombocytopenia-Platelets 100 K on 07/02/2019, on admission 36->down to 22K-->improved to 40k with transfusion. On Xarelto for history of PE,thrombocytopenia worsened likely due to sepsis, gram-negative bacteremia, antibiotics, chemotherapy(carfilzomib).He does have h/o Pancytopenia due to chemotherapy , seen by heme oncology who advised that typically daratumumab does not cause thrombocytopenia and that likely related to sepsis. S/p 1 unit platelet transfusion, counts now improved, held Xarelto while here, last dose 6/5 PM and okay to resume tonight per oncology  Malignant neoplasm of prostate (HCC)multiple myeloma-Patient follows Dr. [name] outpatient, oncology consulted and following along.  Pulmonary embolus, left (HCC)-Diagnosed in 03/2016, on Xarelto, held xareltohere due to severe thrombocytopenia-okay to resume  Paroxysmal A. fib-Rate controlled, not on any rate controlling meds. On Xarelto  Essential hypertension: continue to titrate meds for BP control, on losartan. Can add cardizem if needed upon discharge. SBP 130s now  Hypokalemia: Replaced po/IV . Labs this evening to confirm adequate level. If still low will supplement as OP   Discharge Exam: Vitals:   07/05/19 0544 07/05/19 1155  BP: (!) 143/80 137/74  Pulse: 61 62  Resp: 16 19  Temp: 98.4 F (36.9 C) 97.8 F (36.6 C)  SpO2: 95% 99%   Vitals:   07/04/19 1519 07/04/19 2155 07/05/19 0544 07/05/19 1155  BP: 138/74 130/81 (!) 143/80 137/74  Pulse: 62 64 61 62  Resp: '15 20 16 19  ' Temp: 98.2 F (36.8 C) 99.1 F (37.3 C) 98.4 F (36.9 C) 97.8 F (36.6 C)  TempSrc:  Oral  Oral Oral Oral  SpO2: 100% 96% 95% 99%  Weight:      Height:        General: Pt is alert, awake, not in acute distress Cardiovascular: RRR, S1/S2 +, no rubs, no gallops Respiratory: CTA bilaterally, no wheezing, no rhonchi Abdominal: Soft, NT, ND, bowel sounds + Extremities: no edema, no cyanosis  Discharge Condition:Stable CODE STATUS: Diet recommendation: Recommendations for Outpatient Follow-up:  1. Follow up with PCP:  2. Follow up with consultants:  3. Please obtain follow up labs including:   Home Health services upon discharge:  Equipment/Devices upon discharge:   Discharge Instructions:  Discharge Instructions    Call MD for:  difficulty breathing, headache or visual disturbances   Complete by: As directed    Call MD for:  extreme fatigue   Complete by: As directed    Call MD for:  persistant dizziness or light-headedness   Complete by: As directed    Call MD for:  persistant nausea and vomiting   Complete by: As directed    Call MD for:  redness, tenderness, or signs of infection (pain, swelling, redness, odor or green/yellow discharge around incision site)   Complete by: As directed    Call MD for:  severe uncontrolled pain   Complete by: As directed    Call MD for:  temperature >100.4   Complete by: As directed    Diet - low sodium heart healthy   Complete by: As directed    Increase activity slowly   Complete by: As directed      Allergies as of 07/05/2019   No Known Allergies     Medication List    TAKE these medications   cephALEXin 500 MG capsule Commonly known as: KEFLEX Take 1 capsule (500 mg total) by mouth 4 (four) times daily for 10 days.   Cholecalciferol 50 MCG (2000 UT) Tabs Take 1 tablet by mouth every evening.   diphenoxylate-atropine 2.5-0.025 MG tablet Commonly known as: LOMOTIL Take 1 tablet by mouth daily as needed for diarrhea or loose stools. Usually takes 1 tablet a week   feeding supplement (ENSURE ENLIVE) Liqd Take 237  mLs by mouth 2 (two) times daily between meals. Start taking on: July 06, 2019   hydrOXYzine 25 MG tablet Commonly known as: ATARAX/VISTARIL Take 1 tablet (25 mg total) by mouth at bedtime as needed for anxiety (insomnia).   losartan 50 MG tablet Commonly known as: COZAAR Take 50 mg by mouth daily.   ondansetron 4 MG tablet Commonly known as: ZOFRAN Take 1 tablet (4 mg total) by mouth every 6 (six) hours as needed for nausea.   QC Natural Vegetable 95 % Powd Generic drug: Psyllium Take 1 packet by mouth daily.   sennosides-docusate sodium 8.6-50 MG tablet Commonly known as: SENOKOT-S Take 1 tablet by mouth daily as needed for constipation.   valACYclovir 500 MG tablet Commonly known as: VALTREX Take 500 mg by mouth daily.   vitamin B-12 500 MCG tablet Commonly known as: CYANOCOBALAMIN Take 500 mcg by mouth daily.   Xarelto 20 MG Tabs tablet Generic drug: rivaroxaban TAKE 1 TABLET BY MOUTH DAILY WITH SUPPER. What changed: See the new instructions.       No Known Allergies    The results of significant diagnostics from this hospitalization (including imaging, microbiology, ancillary and laboratory) are listed below for reference.    Labs: BNP (last 3 results) No results for input(s): BNP in the last 8760 hours. Basic Metabolic Panel:  Recent Labs  Lab 06/29/19 1307 06/29/19 1307 07/02/19 1609 07/03/19 0811 07/04/19 0452 07/04/19 1550 07/05/19 0615  NA 142  --  141 142 136  --  141  K 4.0   < > 4.0 3.6 2.9* 3.6 3.2*  CL 106  --  107 109 105  --  108  CO2 27  --  27 23 21*  --  24  GLUCOSE 89  --  111* 106* 100*  --  89  BUN 8  --  '18 13 12  ' --  14  CREATININE 0.76  --  0.72 0.72 0.70  --  0.65  CALCIUM 9.1  --  8.9 8.0* 7.7*  --  8.0*   < > = values in this interval not displayed.   Liver Function Tests: Recent Labs  Lab 06/29/19 1307 07/02/19 1609  AST 18 23  ALT 14 18  ALKPHOS 46 39  BILITOT 0.7 1.1  PROT 5.9* 6.2*  ALBUMIN 3.5 3.7   No  results for input(s): LIPASE, AMYLASE in the last 168 hours. No results for input(s): AMMONIA in the last 168 hours. CBC: Recent Labs  Lab 06/29/19 1307 06/29/19 1307 07/02/19 1609 07/03/19 0345 07/03/19 1823 07/04/19 0452 07/05/19 0615  WBC 4.8  --  3.6* 5.0  --  6.9 3.8*  NEUTROABS 3.5  --  3.3  --   --  6.1 2.9  HGB 13.9  --  13.8 11.8* 12.6* 12.7* 12.4*  HCT 42.4   < > 42.9 36.5* 38.1* 38.6* 37.5*  MCV 94.6  --  96.8 97.3  --  93.5 94.7  PLT 100*  --  36* 22*  --  30* 43*   < > = values in this interval not displayed.   Cardiac Enzymes: No results for input(s): CKTOTAL, CKMB, CKMBINDEX, TROPONINI in the last 168 hours. BNP: Invalid input(s): POCBNP CBG: No results for input(s): GLUCAP in the last 168 hours. D-Dimer No results for input(s): DDIMER in the last 72 hours. Hgb A1c No results for input(s): HGBA1C in the last 72 hours. Lipid Profile No results for input(s): CHOL, HDL, LDLCALC, TRIG, CHOLHDL, LDLDIRECT in the last 72 hours. Thyroid function studies No results for input(s): TSH, T4TOTAL, T3FREE, THYROIDAB in the last 72 hours.  Invalid input(s): FREET3 Anemia work up No results for input(s): VITAMINB12, FOLATE, FERRITIN, TIBC, IRON, RETICCTPCT in the last 72 hours. Urinalysis    Component Value Date/Time   COLORURINE YELLOW 07/03/2019 0925   APPEARANCEUR CLEAR 07/03/2019 0925   LABSPEC 1.013 07/03/2019 0925   LABSPEC 1.020 01/09/2015 0917   PHURINE 7.0 07/03/2019 0925   GLUCOSEU NEGATIVE 07/03/2019 0925   GLUCOSEU Negative 01/09/2015 0917   HGBUR SMALL (A) 07/03/2019 0925   BILIRUBINUR NEGATIVE 07/03/2019 0925   BILIRUBINUR Negative 01/09/2015 0917   KETONESUR NEGATIVE 07/03/2019 0925   PROTEINUR NEGATIVE 07/03/2019 0925   UROBILINOGEN 0.2 01/09/2015 0917   NITRITE NEGATIVE 07/03/2019 0925   LEUKOCYTESUR NEGATIVE 07/03/2019 0925   LEUKOCYTESUR Small 01/09/2015 0917   Sepsis Labs Invalid input(s): PROCALCITONIN,  WBC,   LACTICIDVEN Microbiology Recent Results (from the past 240 hour(s))  Blood culture (routine x 2)     Status: Abnormal   Collection Time: 07/02/19  4:49 PM   Specimen: Left Antecubital; Blood  Result Value Ref Range Status   Specimen Description   Final    LEFT ANTECUBITAL Performed at [facility], [place] [address], [place], [place] [address]    Special Requests   Final  BOTTLES DRAWN AEROBIC AND ANAEROBIC Blood Culture adequate volume Performed at [place] [address], [place], [place] [address]    Culture  Setup Time   Final    GRAM NEGATIVE RODS IN BOTH AEROBIC AND ANAEROBIC BOTTLES CRITICAL RESULT CALLED TO, READ BACK BY AND VERIFIED WITH: L. Wynona Canes PHARMD, AT 1173 07/03/19 BY Rush Landmark Performed at [facility], [place] [address], [place], [place] [address]    Culture AEROMONAS CAVIAE (A)  Final   Report Status 07/05/2019 FINAL  Final   Organism ID, Bacteria AEROMONAS CAVIAE  Final      Susceptibility   Aeromonas caviae - MIC*    CEFAZOLIN 16 SENSITIVE Sensitive     CEFEPIME <=1 SENSITIVE Sensitive     CEFTAZIDIME <=1 SENSITIVE Sensitive     CIPROFLOXACIN <=0.25 SENSITIVE Sensitive     GENTAMICIN <=1 SENSITIVE Sensitive     IMIPENEM <=0.25 SENSITIVE Sensitive     TRIMETH/SULFA <=20 SENSITIVE Sensitive     PIP/TAZO <=4 SENSITIVE Sensitive     * AEROMONAS CAVIAE  Blood Culture ID Panel (Reflexed)     Status: None   Collection Time: 07/02/19  4:49 PM  Result Value Ref Range Status   Enterococcus species NOT DETECTED NOT DETECTED Final   Listeria monocytogenes NOT DETECTED NOT DETECTED Final   Staphylococcus species NOT DETECTED NOT DETECTED Final   Staphylococcus aureus (BCID) NOT DETECTED NOT DETECTED Final   Streptococcus species NOT DETECTED NOT DETECTED Final   Streptococcus agalactiae NOT DETECTED NOT DETECTED Final   Streptococcus pneumoniae NOT DETECTED NOT DETECTED Final   Streptococcus pyogenes NOT DETECTED  NOT DETECTED Final   Acinetobacter baumannii NOT DETECTED NOT DETECTED Final   Enterobacteriaceae species NOT DETECTED NOT DETECTED Final   Enterobacter cloacae complex NOT DETECTED NOT DETECTED Final   Escherichia coli NOT DETECTED NOT DETECTED Final   Klebsiella oxytoca NOT DETECTED NOT DETECTED Final   Klebsiella pneumoniae NOT DETECTED NOT DETECTED Final   Proteus species NOT DETECTED NOT DETECTED Final   Serratia marcescens NOT DETECTED NOT DETECTED Final   Haemophilus influenzae NOT DETECTED NOT DETECTED Final   Neisseria meningitidis NOT DETECTED NOT DETECTED Final   Pseudomonas aeruginosa NOT DETECTED NOT DETECTED Final   Candida albicans NOT DETECTED NOT DETECTED Final   Candida glabrata NOT DETECTED NOT DETECTED Final   Candida krusei NOT DETECTED NOT DETECTED Final   Candida parapsilosis NOT DETECTED NOT DETECTED Final   Candida tropicalis NOT DETECTED NOT DETECTED Final    Comment: Performed at [facility], [place] [address], [place], [place] [address]  Blood culture (routine x 2)     Status: Abnormal   Collection Time: 07/02/19  4:54 PM   Specimen: Right Antecubital; Blood  Result Value Ref Range Status   Specimen Description   Final    RIGHT ANTECUBITAL Performed at [place] [address], [place], [place] [address]    Special Requests   Final    BOTTLES DRAWN AEROBIC AND ANAEROBIC Blood Culture results may not be optimal due to an inadequate volume of blood received in culture bottles Performed at [place] [address], [place], [place] [address]    Culture  Setup Time   Final    GRAM NEGATIVE RODS AEROBIC BOTTLE ONLY CRITICAL VALUE NOTED.  VALUE IS CONSISTENT WITH PREVIOUSLY REPORTED AND CALLED VALUE.    Culture (A)  Final    AEROMONAS CAVIAE SUSCEPTIBILITIES PERFORMED ON PREVIOUS CULTURE WITHIN  THE LAST 5 DAYS. Performed at [facility], [place] [address], [place], [place] [address]    Report Status  07/05/2019 FINAL  Final  SARS Coronavirus 2 by RT PCR (hospital order, performed in [facility] hospital lab) Nasopharyngeal Nasopharyngeal Swab     Status: None   Collection Time: 07/02/19  6:23 PM   Specimen: Nasopharyngeal Swab  Result Value Ref Range Status   SARS Coronavirus 2 NEGATIVE NEGATIVE Final    Comment: (NOTE) SARS-CoV-2 target nucleic acids are NOT DETECTED. The SARS-CoV-2 RNA is generally detectable in upper and lower respiratory specimens during the acute phase of infection. The lowest concentration of SARS-CoV-2 viral copies this assay can detect is 250 copies / mL. A negative result does not preclude SARS-CoV-2 infection and should not be used as the sole basis for treatment or other patient management decisions.  A negative result may occur with improper specimen collection / handling, submission of specimen other than nasopharyngeal swab, presence of viral mutation(s) within the areas targeted by this assay, and inadequate number of viral copies (<250 copies / mL). A negative result must be combined with clinical observations, patient history, and epidemiological information. Fact Sheet for Patients:   StrictlyIdeas.no Fact Sheet for Healthcare Providers: BankingDealers.co.za This test is not yet approved or cleared  by the [place] FDA and has been authorized for detection and/or diagnosis of SARS-CoV-2 by FDA under an Emergency Use Authorization (EUA).  This EUA will remain in effect (meaning this test can be used) for the duration of the COVID-19 declaration under Section 564(b)(1) of the Act, 21 U.S.C. section 360bbb-3(b)(1), unless the authorization is terminated or revoked sooner. Performed at [facility], [place] [address] [place], [place] [address]   Urine Culture     Status: None   Collection Time: 07/03/19  9:25 AM   Specimen: Urine, Random  Result Value Ref Range Status   Specimen  Description   Final    URINE, RANDOM Performed at [place] [address], [place], [place] [address]    Special Requests   Final    NONE Performed at [facility], [place] [address], [place], [place] [address]    Culture   Final    NO GROWTH Performed at [facility], [place] [address], [place], [place] [address]    Report Status 07/04/2019 FINAL  Final  Culture, blood (Routine X 2) w Reflex to ID Panel     Status: None (Preliminary result)   Collection Time: 07/03/19  4:24 PM   Specimen: BLOOD LEFT HAND  Result Value Ref Range Status   Specimen Description   Final    BLOOD LEFT HAND Performed at [facility], [place] [address], [place], [place] [address]    Special Requests   Final    BOTTLES DRAWN AEROBIC AND ANAEROBIC Blood Culture adequate volume Performed at [place] [address] [place], [place] [address]    Culture   Final    NO GROWTH 2 DAYS Performed at [place] [address], [place],  [address]    Report Status PENDING  Incomplete  Culture, blood (Routine X 2) w Reflex to ID Panel     Status: None (Preliminary result)   Collection Time: 07/03/19  6:23 PM   Specimen: BLOOD  Result Value Ref Range Status   Specimen Description   Final    BLOOD RIGHT ARM Performed at [facility], [place] [address], [place], [place] [address]    Special Requests   Final    BOTTLES DRAWN AEROBIC ONLY Blood Culture adequate volume   Culture   Final    NO GROWTH 2 DAYS Performed at [facility], [place] [address], [place], [place] [address]    Report Status PENDING  Incomplete    Procedures/Studies: No results found.  Time coordinating discharge: Over 30 minutes  SIGNED:   Guilford Shi, MD  [facility] 07/05/2019, 3:26 PM

## 2019-07-06 ENCOUNTER — Other Ambulatory Visit: Payer: Self-pay | Admitting: Nurse Practitioner

## 2019-07-06 ENCOUNTER — Telehealth: Payer: Self-pay | Admitting: Oncology

## 2019-07-06 DIAGNOSIS — C9000 Multiple myeloma not having achieved remission: Secondary | ICD-10-CM

## 2019-07-06 NOTE — Telephone Encounter (Signed)
Scheduled appt per 6/9 sch message- pt is aware of appt  

## 2019-07-08 ENCOUNTER — Other Ambulatory Visit: Payer: Self-pay

## 2019-07-08 ENCOUNTER — Inpatient Hospital Stay: Payer: Medicare PPO

## 2019-07-08 DIAGNOSIS — C9000 Multiple myeloma not having achieved remission: Secondary | ICD-10-CM

## 2019-07-08 DIAGNOSIS — J90 Pleural effusion, not elsewhere classified: Secondary | ICD-10-CM | POA: Diagnosis not present

## 2019-07-08 DIAGNOSIS — M7989 Other specified soft tissue disorders: Secondary | ICD-10-CM | POA: Diagnosis not present

## 2019-07-08 DIAGNOSIS — Z923 Personal history of irradiation: Secondary | ICD-10-CM | POA: Diagnosis not present

## 2019-07-08 DIAGNOSIS — Z79899 Other long term (current) drug therapy: Secondary | ICD-10-CM | POA: Diagnosis not present

## 2019-07-08 DIAGNOSIS — Z5111 Encounter for antineoplastic chemotherapy: Secondary | ICD-10-CM | POA: Diagnosis not present

## 2019-07-08 LAB — CULTURE, BLOOD (ROUTINE X 2)
Culture: NO GROWTH
Culture: NO GROWTH
Special Requests: ADEQUATE
Special Requests: ADEQUATE

## 2019-07-08 LAB — CBC WITH DIFFERENTIAL (CANCER CENTER ONLY)
Abs Immature Granulocytes: 0.03 10*3/uL (ref 0.00–0.07)
Basophils Absolute: 0 10*3/uL (ref 0.0–0.1)
Basophils Relative: 0 %
Eosinophils Absolute: 0.1 10*3/uL (ref 0.0–0.5)
Eosinophils Relative: 5 %
HCT: 40.1 % (ref 39.0–52.0)
Hemoglobin: 13.2 g/dL (ref 13.0–17.0)
Immature Granulocytes: 1 %
Lymphocytes Relative: 14 %
Lymphs Abs: 0.4 10*3/uL — ABNORMAL LOW (ref 0.7–4.0)
MCH: 31.7 pg (ref 26.0–34.0)
MCHC: 32.9 g/dL (ref 30.0–36.0)
MCV: 96.4 fL (ref 80.0–100.0)
Monocytes Absolute: 0.5 10*3/uL (ref 0.1–1.0)
Monocytes Relative: 17 %
Neutro Abs: 1.9 10*3/uL (ref 1.7–7.7)
Neutrophils Relative %: 63 %
Platelet Count: 85 10*3/uL — ABNORMAL LOW (ref 150–400)
RBC: 4.16 MIL/uL — ABNORMAL LOW (ref 4.22–5.81)
RDW: 15.3 % (ref 11.5–15.5)
WBC Count: 3 10*3/uL — ABNORMAL LOW (ref 4.0–10.5)
nRBC: 0 % (ref 0.0–0.2)

## 2019-07-10 ENCOUNTER — Other Ambulatory Visit: Payer: Self-pay | Admitting: Oncology

## 2019-07-12 DIAGNOSIS — L03116 Cellulitis of left lower limb: Secondary | ICD-10-CM | POA: Diagnosis not present

## 2019-07-13 ENCOUNTER — Inpatient Hospital Stay: Payer: Medicare PPO

## 2019-07-13 ENCOUNTER — Inpatient Hospital Stay: Payer: Medicare PPO | Admitting: Nutrition

## 2019-07-13 ENCOUNTER — Other Ambulatory Visit: Payer: Self-pay

## 2019-07-13 ENCOUNTER — Other Ambulatory Visit: Payer: Self-pay | Admitting: *Deleted

## 2019-07-13 ENCOUNTER — Inpatient Hospital Stay (HOSPITAL_BASED_OUTPATIENT_CLINIC_OR_DEPARTMENT_OTHER): Payer: Medicare PPO | Admitting: Oncology

## 2019-07-13 VITALS — BP 130/69 | HR 73 | Temp 97.8°F | Resp 17 | Ht 71.0 in | Wt 173.6 lb

## 2019-07-13 DIAGNOSIS — M7989 Other specified soft tissue disorders: Secondary | ICD-10-CM | POA: Diagnosis not present

## 2019-07-13 DIAGNOSIS — J90 Pleural effusion, not elsewhere classified: Secondary | ICD-10-CM | POA: Diagnosis not present

## 2019-07-13 DIAGNOSIS — Z5111 Encounter for antineoplastic chemotherapy: Secondary | ICD-10-CM | POA: Diagnosis not present

## 2019-07-13 DIAGNOSIS — Z79899 Other long term (current) drug therapy: Secondary | ICD-10-CM | POA: Diagnosis not present

## 2019-07-13 DIAGNOSIS — C9000 Multiple myeloma not having achieved remission: Secondary | ICD-10-CM | POA: Diagnosis not present

## 2019-07-13 DIAGNOSIS — Z923 Personal history of irradiation: Secondary | ICD-10-CM | POA: Diagnosis not present

## 2019-07-13 LAB — CMP (CANCER CENTER ONLY)
ALT: 13 U/L (ref 0–44)
AST: 16 U/L (ref 15–41)
Albumin: 3.2 g/dL — ABNORMAL LOW (ref 3.5–5.0)
Alkaline Phosphatase: 46 U/L (ref 38–126)
Anion gap: 9 (ref 5–15)
BUN: 10 mg/dL (ref 8–23)
CO2: 25 mmol/L (ref 22–32)
Calcium: 9.1 mg/dL (ref 8.9–10.3)
Chloride: 110 mmol/L (ref 98–111)
Creatinine: 0.73 mg/dL (ref 0.61–1.24)
GFR, Est AFR Am: >60 mL/min (ref >60)
GFR, Estimated: >60 mL/min (ref >60)
Glucose, Bld: 102 mg/dL — ABNORMAL HIGH (ref 70–99)
Potassium: 4 mmol/L (ref 3.5–5.1)
Sodium: 144 mmol/L (ref 135–145)
Total Bilirubin: 0.6 mg/dL (ref 0.3–1.2)
Total Protein: 5.9 g/dL — ABNORMAL LOW (ref 6.5–8.1)

## 2019-07-13 LAB — CBC WITH DIFFERENTIAL (CANCER CENTER ONLY)
Abs Immature Granulocytes: 0.05 10*3/uL (ref 0.00–0.07)
Basophils Absolute: 0 10*3/uL (ref 0.0–0.1)
Basophils Relative: 1 %
Eosinophils Absolute: 0 10*3/uL (ref 0.0–0.5)
Eosinophils Relative: 1 %
HCT: 39.8 % (ref 39.0–52.0)
Hemoglobin: 13.1 g/dL (ref 13.0–17.0)
Immature Granulocytes: 1 %
Lymphocytes Relative: 12 %
Lymphs Abs: 0.5 10*3/uL — ABNORMAL LOW (ref 0.7–4.0)
MCH: 31.6 pg (ref 26.0–34.0)
MCHC: 32.9 g/dL (ref 30.0–36.0)
MCV: 95.9 fL (ref 80.0–100.0)
Monocytes Absolute: 0.4 10*3/uL (ref 0.1–1.0)
Monocytes Relative: 10 %
Neutro Abs: 2.8 10*3/uL (ref 1.7–7.7)
Neutrophils Relative %: 75 %
Platelet Count: 115 10*3/uL — ABNORMAL LOW (ref 150–400)
RBC: 4.15 MIL/uL — ABNORMAL LOW (ref 4.22–5.81)
RDW: 14.7 % (ref 11.5–15.5)
WBC Count: 3.8 10*3/uL — ABNORMAL LOW (ref 4.0–10.5)
nRBC: 0 % (ref 0.0–0.2)

## 2019-07-13 MED ORDER — DIPHENHYDRAMINE HCL 25 MG PO CAPS
ORAL_CAPSULE | ORAL | Status: AC
  Filled 2019-07-13: qty 2

## 2019-07-13 MED ORDER — ACETAMINOPHEN 325 MG PO TABS
650.0000 mg | ORAL_TABLET | Freq: Once | ORAL | Status: AC
  Administered 2019-07-13: 650 mg via ORAL

## 2019-07-13 MED ORDER — MONTELUKAST SODIUM 10 MG PO TABS
10.0000 mg | ORAL_TABLET | Freq: Once | ORAL | Status: AC
  Administered 2019-07-13: 10 mg via ORAL

## 2019-07-13 MED ORDER — DEXTROSE 5 % IV SOLN
56.0000 mg/m2 | Freq: Once | INTRAVENOUS | Status: AC
  Administered 2019-07-13: 120 mg via INTRAVENOUS
  Filled 2019-07-13: qty 60

## 2019-07-13 MED ORDER — ZOLEDRONIC ACID 4 MG/100ML IV SOLN
INTRAVENOUS | Status: AC
  Filled 2019-07-13: qty 100

## 2019-07-13 MED ORDER — ZOLEDRONIC ACID 4 MG/100ML IV SOLN
4.0000 mg | Freq: Once | INTRAVENOUS | Status: AC
  Administered 2019-07-13: 4 mg via INTRAVENOUS

## 2019-07-13 MED ORDER — DEXAMETHASONE 4 MG PO TABS
ORAL_TABLET | ORAL | Status: AC
  Filled 2019-07-13: qty 5

## 2019-07-13 MED ORDER — SODIUM CHLORIDE 0.9 % IV SOLN
INTRAVENOUS | Status: DC
  Filled 2019-07-13 (×2): qty 250

## 2019-07-13 MED ORDER — MONTELUKAST SODIUM 10 MG PO TABS
ORAL_TABLET | ORAL | Status: AC
  Filled 2019-07-13: qty 1

## 2019-07-13 MED ORDER — CEPHALEXIN 500 MG PO CAPS: 500.0000 mg | ORAL_CAPSULE | Freq: Four times a day (QID) | ORAL | 0 refills | Status: AC

## 2019-07-13 MED ORDER — DARATUMUMAB-HYALURONIDASE-FIHJ 1800-30000 MG-UT/15ML ~~LOC~~ SOLN
1800.0000 mg | Freq: Once | SUBCUTANEOUS | Status: AC
  Administered 2019-07-13: 1800 mg via SUBCUTANEOUS
  Filled 2019-07-13: qty 15

## 2019-07-13 MED ORDER — ACETAMINOPHEN 325 MG PO TABS
ORAL_TABLET | ORAL | Status: AC
  Filled 2019-07-13: qty 2

## 2019-07-13 MED ORDER — DEXAMETHASONE 4 MG PO TABS
20.0000 mg | ORAL_TABLET | Freq: Once | ORAL | Status: AC
  Administered 2019-07-13: 20 mg via ORAL

## 2019-07-13 MED ORDER — DIPHENHYDRAMINE HCL 25 MG PO CAPS
50.0000 mg | ORAL_CAPSULE | Freq: Once | ORAL | Status: AC
  Administered 2019-07-13: 50 mg via ORAL

## 2019-07-13 NOTE — Patient Instructions (Signed)
Please provide a copy of your Medical Advanced Directive to have scanned into your medical record.

## 2019-07-13 NOTE — Progress Notes (Signed)
Mitchell Lowery OFFICE PROGRESS NOTE   Diagnosis: Multiple myeloma  INTERVAL HISTORY:   Mitchell Lowery was discharged from the hospital on 07/05/2019 after admission with gram-negative bacteremia.  He has a few more days of Keflex.  No fever or bleeding.  He feels well.  He is walking 2 miles per day.  He continues to have swelling and erythema at the left lower leg.  No chest wall or low back pain.  Objective:  Vital signs in last 24 hours:  Blood pressure 130/69, pulse 73, temperature 97.8 F (36.6 C), temperature source Temporal, resp. rate 17, height 5' 11" (1.803 m), weight 173 lb 9.6 oz (78.7 kg), SpO2 97 %.    Resp: Lungs clear bilaterally Cardio: Regular rate and rhythm GI: No hepatosplenomegaly Vascular: Trace edema at the left lower leg  Skin: Healed abrasions at the left lower leg, erythema at the left pretibial region Musculoskeletal: No chest wall mass    Lab Results:  Lab Results  Component Value Date   WBC 3.8 (L) 07/13/2019   HGB 13.1 07/13/2019   HCT 39.8 07/13/2019   MCV 95.9 07/13/2019   PLT 115 (L) 07/13/2019   NEUTROABS 2.8 07/13/2019    CMP  Lab Results  Component Value Date   NA 141 07/05/2019   K 3.5 07/05/2019   CL 108 07/05/2019   CO2 24 07/05/2019   GLUCOSE 89 07/05/2019   BUN 14 07/05/2019   CREATININE 0.65 07/05/2019   CALCIUM 8.0 (L) 07/05/2019   PROT 6.2 (L) 07/02/2019   ALBUMIN 3.7 07/02/2019   AST 23 07/02/2019   ALT 18 07/02/2019   ALKPHOS 39 07/02/2019   BILITOT 1.1 07/02/2019   GFRNONAA >60 07/05/2019   GFRAA >60 07/05/2019     Medications: I have reviewed the patient's current medications.   Assessment/Plan: 1. Multiple myeloma-confirmed on a bone marrow biopsy 08/07/2014, IgA lambda  Serum M spike and increased serum free lambda light chains  Myeloma FISH panel negative for chromosome 4, 11, 12, 13, 14, and 17 abnormalities, cytogenetics with no metaphases  Bone survey 08/15/2014 with indeterminant  lucent skull lesions  Cycle 1 RVD 08/22/2014  Cycle 2 RVD 09/19/2014  Cycle 3 RVD 10/17/2014  Cycle 4 RVD 11/14/2014 (Decadron reduced to 20 mg weekly, Revlimid 15 mg days 1 through 14, Velcade 3/4 weeks)  Cycle 5 RVD 12/12/2014  Serum M spike not detected 12/26/2014  Cycle 6 RVD 01/09/2015  Maintenance Revlimid, 10 mg daily, 02/05/2015 , discontinue 02/26/2015 secondary to leg edema and diarrhea  Revlimid resumed at a dose of 10 mg every other day beginning 03/13/2015  PET scan 40/34/7425-ZD hypermetabolic bone lesions, few lucent lesions in the spine and pelvis  Revlimid discontinued January 2019  Enrollment on a clinical trial at [facility] with pomalidomide/Decadron+/- ixazomibbeginning 03/10/2017  Treatment discontinued February 2021 secondary to rising serum lambda light chains  PET 04/05/2019-hypermetabolic right axillary/chest wall nodes, right paravertebral mass, small right pleural effusion, retrocrural node, right lateral abdominal wall mass, L5 lesion mild compression deformity. Focus of hypermetabolism at the anterior left pelvic wall without a CT correlate. Hypermetabolic lytic lesion in the left T3 transverse process  Cycle 1 daratumumab/carfilzomib/Decadron 04/05/2019  Radiation to the right chest wall mass and lumbar spine beginning 04/25/2019  Cycle 2 daratumumab/carfilzomib/Decadron 05/04/2019 (carfilzomib dose escalated); day 8 held due to neutropenia, thrombocytopenia; day 15 05/18/2019 (carfilzomib dose reduced)  Daratumumab/carfilzomib/Decadron changed to every 2-week dosing beginning 05/25/2019  2. 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 History of intermittent prostatitis  4. Skin rash 10/24/2014-referred to dermatology, biopsy consistent with local hypersensitivity reaction  5. Urinary  retention-most likely related to radiation toxicity, followed by urology, status post a Urolift procedure 03/12/2015-improved  6. Left lower leg cellulitis 06/24/2015-treated with Keflex  7. History of mild thrombocytopenia secondary tomyeloma and systemic therapy  8. History of mild neutropenia-secondary to myeloma and systemic therapy  9. Fall with a skull fracture and subarachnoid blood/right frontal lobe contusions 10/17/2015  10. Right lower lobe pulmonary embolus 03/28/2016. Lovenox , transitioned to Xarelto  11. Vesicular rashJune 2018-potentially related to the zoster vaccine, resolved  12. History of atrial fibrillation  13. Aortic stenosis  14.  07/02/2019-hospital admission for sepsis secondary to cellulitis of the left lower extremity with gram-negative rod bacteremia (Aeromonas caviae)    Disposition: Mitchell Lowery appears well.  He has recovered from the episode of bacteremia.  The platelet count has recovered to baseline.  He will complete a few more days of Keflex.  The plan is to resume treatment with daratumumab/Decadron/carfilzomib today.  He will call for bleeding or a fever.  Mitchell Lowery will return for an office visit in 2 weeks.  [name], MD  07/13/2019  11:49 AM

## 2019-07-13 NOTE — Patient Instructions (Signed)
[facility] Discharge Instructions for Patients Receiving Chemotherapy  Today you received the following chemotherapy agents: Kyprolis, Zometa, and Darzalex Faspro  To help prevent nausea and vomiting after your treatment, we encourage you to take your nausea medication as prescribed.    If you develop nausea and vomiting that is not controlled by your nausea medication, call the clinic.   BELOW ARE SYMPTOMS THAT SHOULD BE REPORTED IMMEDIATELY:  *FEVER GREATER THAN 100.5 F  *CHILLS WITH OR WITHOUT FEVER  NAUSEA AND VOMITING THAT IS NOT CONTROLLED WITH YOUR NAUSEA MEDICATION  *UNUSUAL SHORTNESS OF BREATH  *UNUSUAL BRUISING OR BLEEDING  TENDERNESS IN MOUTH AND THROAT WITH OR WITHOUT PRESENCE OF ULCERS  *URINARY PROBLEMS  *BOWEL PROBLEMS  UNUSUAL RASH Items with * indicate a potential emergency and should be followed up as soon as possible.  Feel free to call the clinic should you have any questions or concerns. The clinic phone number is [phone].  Please show the [facility] at check-in to the Emergency Department and triage nurse.

## 2019-07-13 NOTE — Progress Notes (Signed)
Nutrition follow-up completed with patient during infusion for myeloma. Patient reports he is eating well. Noted weight was documented as 173.6 pounds on June 16.  Patient reports this is a comfortable weight and he likes to stay around 170 pounds. He is not consuming oral nutrition supplements because his diet is well-rounded and he is tolerating food without difficulty. He has no questions or concerns.  Nutrition diagnosis of inadequate oral intake has resolved.  Patient was encouraged to contact RD for any future questions or concerns.  He has contact information.  Please reconsult if RD can be of any help.  **Disclaimer: This note was dictated with voice recognition software. Similar sounding words can inadvertently be transcribed and this note may contain transcription errors which may not have been corrected upon publication of note.**

## 2019-07-13 NOTE — Progress Notes (Signed)
Reordered keflex per [name] for patient to have at home and resume if he gets high fever again. LLE wound improved, but leg is still very red.

## 2019-07-14 LAB — PROTEIN ELECTROPHORESIS, SERUM
A/G Ratio: 1.3 (ref 0.7–1.7)
Albumin ELP: 3.1 g/dL (ref 2.9–4.4)
Alpha-1-Globulin: 0.3 g/dL (ref 0.0–0.4)
Alpha-2-Globulin: 0.8 g/dL (ref 0.4–1.0)
Beta Globulin: 0.9 g/dL (ref 0.7–1.3)
Gamma Globulin: 0.3 g/dL — ABNORMAL LOW (ref 0.4–1.8)
Globulin, Total: 2.3 g/dL (ref 2.2–3.9)
M-Spike, %: 0.1 g/dL — ABNORMAL HIGH
Total Protein ELP: 5.4 g/dL — ABNORMAL LOW (ref 6.0–8.5)

## 2019-07-14 LAB — KAPPA/LAMBDA LIGHT CHAINS
Kappa free light chain: 1.8 mg/L — ABNORMAL LOW (ref 3.3–19.4)
Kappa, lambda light chain ratio: 0.82 (ref 0.26–1.65)
Lambda free light chains: 2.2 mg/L — ABNORMAL LOW (ref 5.7–26.3)

## 2019-07-14 LAB — IGA: IgA: 7 mg/dL — ABNORMAL LOW (ref 61–437)

## 2019-07-19 ENCOUNTER — Other Ambulatory Visit: Payer: Self-pay

## 2019-07-19 ENCOUNTER — Ambulatory Visit (INDEPENDENT_AMBULATORY_CARE_PROVIDER_SITE_OTHER): Payer: Medicare PPO | Admitting: Infectious Disease

## 2019-07-19 ENCOUNTER — Encounter: Payer: Self-pay | Admitting: Infectious Disease

## 2019-07-19 VITALS — BP 128/76 | HR 69 | Temp 97.6°F | Wt 173.0 lb

## 2019-07-19 DIAGNOSIS — I2699 Other pulmonary embolism without acute cor pulmonale: Secondary | ICD-10-CM

## 2019-07-19 DIAGNOSIS — N390 Urinary tract infection, site not specified: Secondary | ICD-10-CM

## 2019-07-19 DIAGNOSIS — C9001 Multiple myeloma in remission: Secondary | ICD-10-CM

## 2019-07-19 DIAGNOSIS — C61 Malignant neoplasm of prostate: Secondary | ICD-10-CM | POA: Diagnosis not present

## 2019-07-19 DIAGNOSIS — L03116 Cellulitis of left lower limb: Secondary | ICD-10-CM | POA: Diagnosis not present

## 2019-07-19 DIAGNOSIS — R7881 Bacteremia: Secondary | ICD-10-CM

## 2019-07-19 DIAGNOSIS — R319 Hematuria, unspecified: Secondary | ICD-10-CM | POA: Diagnosis not present

## 2019-07-19 NOTE — Progress Notes (Signed)
Subjective:  Implant follow-up for cellulitis and bacteremia with Aeromonas  Patient ID: Mitchell Lowery, male    DOB: August 28, 1934, 85 y.o.   MRN: [id number]   HPI  85 y.o. male with prostate cancer, multiple myeloma,on chemotherapy  Admitted with severe cellulitis with aeromonas caviae bacteremia and sepsis.  We narrowed him to ceftriaxone in the hospital he cleared his blood cultures.  We are able to de-escalate him to Keflex and he completed a total of 10 days of antibiotics.  He has restarted chemotherapy for his multiple myeloma.  Cellulitis seems largely resolved.  Since he has had cellulitis once before when he came back from [place] and undoubtedly has damage to his lymphatics and is on chemotherapy I would  want him to have a bottle of Keflex at home to initiate the first signs of cellulitis should it recur, again not necessarily with this organism but with may be potentially more typical offenders such as strep  Past Medical History:  Diagnosis Date  . Anal fistula    treated in [place]  . Anemia    only due to myeloma- "normal now"  . Heart murmur   . Hemorrhoids   . Multiple myeloma (HCC)    1 month remission now-last tx. 1 month ago- /Dr. [name]  . Prostate cancer [facility])    urinary retention, surgery planned-prior radiation, and seed implant about 1 year ago.  . Prostatitis   . Pulmonary embolus, left ([place]) 03/28/2016  . UTI (lower urinary tract infection)    multiple urinary trract infection- being tx presently with antibiotic at present.    Past Surgical History:  Procedure Laterality Date  . ANAL FISTULECTOMY    . ANKLE SURGERY    . CYSTOSCOPY    . CYSTOSCOPY WITH INSERTION OF UROLIFT N/A 03/12/2015   Procedure: CYSTOSCOPY WITH INSERTION OF UROLIFT;  Surgeon: Franchot Gallo, MD;  Location: [facility];  Service: Urology;  Laterality: N/A;  . PROSTATE BIOPSY    . PROSTATE BIOPSY    . RADIOACTIVE SEED IMPLANT N/A 07/21/2014   Procedure: RADIOACTIVE SEED  IMPLANT/BRACHYTHERAPY IMPLANT;  Surgeon: Franchot Gallo, MD;  Location: [facility];  Service: Urology;  Laterality: N/A;  . TONSILLECTOMY      Family History  Problem Relation Age of Onset  . Cancer Maternal Grandfather        mouth/throat ca      Social History   Socioeconomic History  . Marital status: Married    Spouse name: Not on file  . Number of children: Not on file  . Years of education: Not on file  . Highest education level: Not on file  Occupational History  . Not on file  Tobacco Use  . Smoking status: Never Smoker  . Smokeless tobacco: Never Used  Vaping Use  . Vaping Use: Never used  Substance and Sexual Activity  . Alcohol use: Yes    Comment: 1 glass wine daily  . Drug use: No  . Sexual activity: Yes  Other Topics Concern  . Not on file  Social History Narrative  . Not on file   Social Determinants of Health   Financial Resource Strain:   . Difficulty of Paying Living Expenses:   Food Insecurity:   . Worried About Charity fundraiser in the Last Year:   . Arboriculturist in the Last Year:   Transportation Needs:   . Film/video editor (Medical):   Marland Kitchen Lack of Transportation (Non-Medical):   Physical  Activity:   . Days of Exercise per Week:   . Minutes of Exercise per Session:   Stress:   . Feeling of Stress :   Social Connections:   . Frequency of Communication with Friends and Family:   . Frequency of Social Gatherings with Friends and Family:   . Attends Religious Services:   . Active Member of Clubs or Organizations:   . Attends Archivist Meetings:   Marland Kitchen Marital Status:     No Known Allergies   Current Outpatient Medications:  .  cephALEXin (KEFLEX) 500 MG capsule, Take 1 capsule (500 mg total) by mouth 4 (four) times daily for 10 days., Disp: 40 capsule, Rfl: 0 .  Cholecalciferol 2000 units TABS, Take 1 tablet by mouth every evening. , Disp: , Rfl:  .  diphenoxylate-atropine (LOMOTIL) 2.5-0.025 MG  tablet, Take 1 tablet by mouth daily as needed for diarrhea or loose stools. Usually takes 1 tablet a week, Disp: , Rfl:  .  feeding supplement, ENSURE ENLIVE, (ENSURE ENLIVE) LIQD, Take 237 mLs by mouth 2 (two) times daily between meals., Disp: 237 mL, Rfl: 12 .  hydrOXYzine (ATARAX/VISTARIL) 25 MG tablet, Take 1 tablet (25 mg total) by mouth at bedtime as needed for anxiety (insomnia)., Disp: 15 tablet, Rfl: 0 .  losartan (COZAAR) 50 MG tablet, Take 50 mg by mouth daily. , Disp: , Rfl:  .  ondansetron (ZOFRAN) 4 MG tablet, Take 1 tablet (4 mg total) by mouth every 6 (six) hours as needed for nausea. (Patient not taking: Reported on 07/13/2019), Disp: 20 tablet, Rfl: 0 .  Psyllium (QC NATURAL VEGETABLE) 95 % POWD, Take 1 packet by mouth daily. , Disp: , Rfl:  .  sennosides-docusate sodium (SENOKOT-S) 8.6-50 MG tablet, Take 1 tablet by mouth daily as needed for constipation. , Disp: , Rfl:  .  valACYclovir (VALTREX) 500 MG tablet, Take 500 mg by mouth daily. , Disp: , Rfl:  .  vitamin B-12 (CYANOCOBALAMIN) 500 MCG tablet, Take 500 mcg by mouth daily., Disp: , Rfl:  .  XARELTO 20 MG TABS tablet, TAKE 1 TABLET BY MOUTH DAILY WITH SUPPER. (Patient taking differently: Take 20 mg by mouth daily with supper. ), Disp: 30 tablet, Rfl: 11   Review of Systems  Constitutional: Negative for activity change, appetite change, chills, diaphoresis, fatigue, fever and unexpected weight change.  HENT: Negative for congestion, rhinorrhea, sinus pressure, sneezing, sore throat and trouble swallowing.   Eyes: Negative for photophobia and visual disturbance.  Respiratory: Negative for cough, chest tightness, shortness of breath, wheezing and stridor.   Cardiovascular: Negative for chest pain, palpitations and leg swelling.  Gastrointestinal: Negative for abdominal distention, abdominal pain, anal bleeding, blood in stool, constipation, diarrhea, nausea and vomiting.  Genitourinary: Negative for difficulty urinating,  dysuria, flank pain and hematuria.  Musculoskeletal: Negative for arthralgias, back pain, gait problem, joint swelling and myalgias.  Skin: Negative for color change, pallor, rash and wound.  Neurological: Negative for dizziness, tremors, weakness and light-headedness.  Hematological: Negative for adenopathy. Does not bruise/bleed easily.  Psychiatric/Behavioral: Negative for agitation, behavioral problems, confusion, decreased concentration, dysphoric mood and sleep disturbance.       Objective:   Physical Exam Constitutional:      Appearance: He is well-developed.  HENT:     Head: Normocephalic and atraumatic.  Eyes:     Conjunctiva/sclera: Conjunctivae normal.  Cardiovascular:     Rate and Rhythm: Normal rate and regular rhythm.  Pulmonary:     Effort: Pulmonary  effort is normal. No respiratory distress.     Breath sounds: No wheezing.  Abdominal:     General: There is no distension.     Palpations: Abdomen is soft.  Musculoskeletal:        General: No tenderness. Normal range of motion.     Cervical back: Normal range of motion and neck supple.  Skin:    General: Skin is warm and dry.     Coloration: Skin is not pale.     Findings: No erythema or rash.  Neurological:     General: No focal deficit present.     Mental Status: He is alert and oriented to person, place, and time.  Psychiatric:        Mood and Affect: Mood normal.        Behavior: Behavior normal.        Thought Content: Thought content normal.        Judgment: Judgment normal.    Leg from hospital:  07/02/2019:     07/02/2019:     07/18/2019:             Assessment & Plan:  Cellulitis and bacteremia with Aeromonas caviae:  Completed treatment  Because of damage to lymphatics with cellulitis he will be at recurrence for cellultiis (not likely with aeromonas but more likely strep. He is to keep bottle of keflex with him at all times in case cellulitis recurs and to call me.

## 2019-07-24 ENCOUNTER — Other Ambulatory Visit: Payer: Self-pay | Admitting: Oncology

## 2019-07-27 ENCOUNTER — Encounter: Payer: Self-pay | Admitting: *Deleted

## 2019-07-27 ENCOUNTER — Other Ambulatory Visit: Payer: Self-pay

## 2019-07-27 ENCOUNTER — Inpatient Hospital Stay: Payer: Medicare PPO

## 2019-07-27 ENCOUNTER — Inpatient Hospital Stay (HOSPITAL_BASED_OUTPATIENT_CLINIC_OR_DEPARTMENT_OTHER): Payer: Medicare PPO | Admitting: Oncology

## 2019-07-27 VITALS — BP 129/67 | HR 72 | Temp 98.1°F | Resp 18 | Ht 71.0 in | Wt 174.8 lb

## 2019-07-27 DIAGNOSIS — Z923 Personal history of irradiation: Secondary | ICD-10-CM | POA: Diagnosis not present

## 2019-07-27 DIAGNOSIS — Z79899 Other long term (current) drug therapy: Secondary | ICD-10-CM | POA: Diagnosis not present

## 2019-07-27 DIAGNOSIS — J90 Pleural effusion, not elsewhere classified: Secondary | ICD-10-CM | POA: Diagnosis not present

## 2019-07-27 DIAGNOSIS — M7989 Other specified soft tissue disorders: Secondary | ICD-10-CM | POA: Diagnosis not present

## 2019-07-27 DIAGNOSIS — C9000 Multiple myeloma not having achieved remission: Secondary | ICD-10-CM

## 2019-07-27 DIAGNOSIS — Z5111 Encounter for antineoplastic chemotherapy: Secondary | ICD-10-CM | POA: Diagnosis not present

## 2019-07-27 LAB — CBC WITH DIFFERENTIAL (CANCER CENTER ONLY)
Abs Immature Granulocytes: 0.02 10*3/uL (ref 0.00–0.07)
Basophils Absolute: 0 10*3/uL (ref 0.0–0.1)
Basophils Relative: 1 %
Eosinophils Absolute: 0 10*3/uL (ref 0.0–0.5)
Eosinophils Relative: 1 %
HCT: 40.1 % (ref 39.0–52.0)
Hemoglobin: 13.3 g/dL (ref 13.0–17.0)
Immature Granulocytes: 1 %
Lymphocytes Relative: 12 %
Lymphs Abs: 0.5 10*3/uL — ABNORMAL LOW (ref 0.7–4.0)
MCH: 31.9 pg (ref 26.0–34.0)
MCHC: 33.2 g/dL (ref 30.0–36.0)
MCV: 96.2 fL (ref 80.0–100.0)
Monocytes Absolute: 0.5 10*3/uL (ref 0.1–1.0)
Monocytes Relative: 12 %
Neutro Abs: 3 10*3/uL (ref 1.7–7.7)
Neutrophils Relative %: 73 %
Platelet Count: 106 10*3/uL — ABNORMAL LOW (ref 150–400)
RBC: 4.17 MIL/uL — ABNORMAL LOW (ref 4.22–5.81)
RDW: 13.8 % (ref 11.5–15.5)
WBC Count: 4.1 10*3/uL (ref 4.0–10.5)
nRBC: 0 % (ref 0.0–0.2)

## 2019-07-27 LAB — CMP (CANCER CENTER ONLY)
ALT: 11 U/L (ref 0–44)
AST: 16 U/L (ref 15–41)
Albumin: 3.4 g/dL — ABNORMAL LOW (ref 3.5–5.0)
Alkaline Phosphatase: 45 U/L (ref 38–126)
Anion gap: 8 (ref 5–15)
BUN: 7 mg/dL — ABNORMAL LOW (ref 8–23)
CO2: 26 mmol/L (ref 22–32)
Calcium: 9.1 mg/dL (ref 8.9–10.3)
Chloride: 107 mmol/L (ref 98–111)
Creatinine: 0.73 mg/dL (ref 0.61–1.24)
GFR, Est AFR Am: >60 mL/min (ref >60)
GFR, Estimated: >60 mL/min (ref >60)
Glucose, Bld: 90 mg/dL (ref 70–99)
Potassium: 4 mmol/L (ref 3.5–5.1)
Sodium: 141 mmol/L (ref 135–145)
Total Bilirubin: 0.6 mg/dL (ref 0.3–1.2)
Total Protein: 6 g/dL — ABNORMAL LOW (ref 6.5–8.1)

## 2019-07-27 MED ORDER — ACETAMINOPHEN 325 MG PO TABS
650.0000 mg | ORAL_TABLET | Freq: Once | ORAL | Status: AC
  Administered 2019-07-27: 650 mg via ORAL

## 2019-07-27 MED ORDER — DEXAMETHASONE 4 MG PO TABS
ORAL_TABLET | ORAL | Status: AC
  Filled 2019-07-27: qty 5

## 2019-07-27 MED ORDER — DARATUMUMAB-HYALURONIDASE-FIHJ 1800-30000 MG-UT/15ML ~~LOC~~ SOLN
1800.0000 mg | Freq: Once | SUBCUTANEOUS | Status: AC
  Administered 2019-07-27: 1800 mg via SUBCUTANEOUS
  Filled 2019-07-27: qty 15

## 2019-07-27 MED ORDER — MONTELUKAST SODIUM 10 MG PO TABS
10.0000 mg | ORAL_TABLET | Freq: Once | ORAL | Status: AC
  Administered 2019-07-27: 10 mg via ORAL

## 2019-07-27 MED ORDER — ACETAMINOPHEN 325 MG PO TABS
ORAL_TABLET | ORAL | Status: AC
  Filled 2019-07-27: qty 2

## 2019-07-27 MED ORDER — MONTELUKAST SODIUM 10 MG PO TABS
ORAL_TABLET | ORAL | Status: AC
  Filled 2019-07-27: qty 1

## 2019-07-27 MED ORDER — SODIUM CHLORIDE 0.9 % IV SOLN
INTRAVENOUS | Status: DC
  Filled 2019-07-27: qty 250

## 2019-07-27 MED ORDER — DIPHENHYDRAMINE HCL 25 MG PO CAPS
ORAL_CAPSULE | ORAL | Status: AC
  Filled 2019-07-27: qty 2

## 2019-07-27 MED ORDER — DIPHENHYDRAMINE HCL 25 MG PO CAPS
50.0000 mg | ORAL_CAPSULE | Freq: Once | ORAL | Status: AC
  Administered 2019-07-27: 50 mg via ORAL

## 2019-07-27 MED ORDER — DEXAMETHASONE 4 MG PO TABS
20.0000 mg | ORAL_TABLET | Freq: Once | ORAL | Status: AC
  Administered 2019-07-27: 20 mg via ORAL

## 2019-07-27 MED ORDER — DEXTROSE 5 % IV SOLN
56.0000 mg/m2 | Freq: Once | INTRAVENOUS | Status: AC
  Administered 2019-07-27: 120 mg via INTRAVENOUS
  Filled 2019-07-27: qty 60

## 2019-07-27 MED FILL — XARELTO 20 MG TABLET: 20 | 30 days supply | Qty: 30 | Fill #4

## 2019-07-27 NOTE — Progress Notes (Signed)
Wants referral to physical therapy again regarding his hip and arm pain. Wishes to return to rehab center he went to in the past. He will call back with this information once located. Per [name]: OK to make the referral.

## 2019-07-27 NOTE — Patient Instructions (Signed)
[facility] Discharge Instructions for Patients Receiving Chemotherapy  Today you received the following chemotherapy agents Keytruda,Alimta  To help prevent nausea and vomiting after your treatment, we encourage you to take your nausea medication as directed  If you develop nausea and vomiting that is not controlled by your nausea medication, call the clinic.   BELOW ARE SYMPTOMS THAT SHOULD BE REPORTED IMMEDIATELY:  *FEVER GREATER THAN 100.5 F  *CHILLS WITH OR WITHOUT FEVER  NAUSEA AND VOMITING THAT IS NOT CONTROLLED WITH YOUR NAUSEA MEDICATION  *UNUSUAL SHORTNESS OF BREATH  *UNUSUAL BRUISING OR BLEEDING  TENDERNESS IN MOUTH AND THROAT WITH OR WITHOUT PRESENCE OF ULCERS  *URINARY PROBLEMS  *BOWEL PROBLEMS  UNUSUAL RASH Items with * indicate a potential emergency and should be followed up as soon as possible.  Feel free to call the clinic should you have any questions or concerns. The clinic phone number is [phone].  Please show the CHEMO ALERT CARD at check-in to the Emergency Department and triage nurse.   

## 2019-07-27 NOTE — Progress Notes (Signed)
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  He feels well.  No respiratory symptoms.  He walks approximately 1.5 miles daily.  He has mild discomfort at the right upper arm with activity.  He also has mild discomfort at the right anterior iliac.  No change in baseline neuropathy symptoms.  No fever.  Objective:  Vital signs in last 24 hours:  Blood pressure 129/67, pulse 72, temperature 98.1 F (36.7 C), temperature source Temporal, resp. rate 18, height '5\' 11"'  (1.803 m), weight 174 lb 12.8 oz (79.3 kg), SpO2 97 %.     Resp: Lungs clear bilaterally Cardio: Regular rate and rhythm, 2/6 systolic murmur GI: No hepatosplenomegaly, no mass, nontender Vascular: Trace edema at the left greater than right lower leg Musculoskeletal: No tenderness or mass at the right upper arm.  No pain with motion of the right shoulder.  No tenderness at the right iliac.  No pain with motion at the right hip.  Skin: Fading erythema at the left lower leg with superficial desquamation.   Lab Results:  Lab Results  Component Value Date   WBC 4.1 07/27/2019   HGB 13.3 07/27/2019   HCT 40.1 07/27/2019   MCV 96.2 07/27/2019   PLT 106 (L) 07/27/2019   NEUTROABS 3.0 07/27/2019    CMP  Lab Results  Component Value Date   NA 141 07/27/2019   K 4.0 07/27/2019   CL 107 07/27/2019   CO2 26 07/27/2019   GLUCOSE 90 07/27/2019   BUN 7 (L) 07/27/2019   CREATININE 0.73 07/27/2019   CALCIUM 9.1 07/27/2019   PROT 6.0 (L) 07/27/2019   ALBUMIN 3.4 (L) 07/27/2019   AST 16 07/27/2019   ALT 11 07/27/2019   ALKPHOS 45 07/27/2019   BILITOT 0.6 07/27/2019   GFRNONAA >60 07/27/2019   GFRAA >60 07/27/2019   07/13/2019: IgA 7, lambda light chains 2.2  Medications: I have reviewed the patient's current medications.   Assessment/Plan: 1. Multiple myeloma-confirmed on  a bone marrow biopsy 08/07/2014, IgA lambda  Serum M spike and increased serum free lambda light chains  Myeloma FISH panel negative for chromosome 4, 11, 12, 13, 14, and 17 abnormalities, cytogenetics with no metaphases  Bone survey 08/15/2014 with indeterminant lucent skull lesions  Cycle 1 RVD 08/22/2014  Cycle 2 RVD 09/19/2014  Cycle 3 RVD 10/17/2014  Cycle 4 RVD 11/14/2014 (Decadron reduced to 20 mg weekly, Revlimid 15 mg days 1 through 14, Velcade 3/4 weeks)  Cycle 5 RVD 12/12/2014  Serum M spike not detected 12/26/2014  Cycle 6 RVD 01/09/2015  Maintenance Revlimid, 10 mg daily, 02/05/2015 , discontinue 02/26/2015 secondary to leg edema and diarrhea  Revlimid resumed at a dose of 10 mg every other day beginning 03/13/2015  PET scan 74/08/1446-[place] hypermetabolic bone lesions, few lucent lesions in the spine and pelvis  Revlimid discontinued January 2019  Enrollment on a clinical trial at [facility] with pomalidomide/Decadron+/- ixazomibbeginning 03/10/2017  Treatment discontinued February 2021 secondary to rising serum lambda light chains  PET 04/05/2019-hypermetabolic right axillary/chest wall nodes, right paravertebral mass, small right pleural effusion, retrocrural node, right lateral abdominal wall mass, L5 lesion mild compression deformity. Focus of hypermetabolism at the anterior left pelvic wall without a CT correlate. Hypermetabolic lytic lesion in the left T3 transverse process  Cycle 1 daratumumab/carfilzomib/Decadron 04/05/2019  Radiation to the right chest wall mass and lumbar spine beginning 04/25/2019  Cycle  2 daratumumab/carfilzomib/Decadron 05/04/2019 (carfilzomib dose escalated); day 8 held due to neutropenia, thrombocytopenia; day 15 05/18/2019 (carfilzomib dose reduced)  Daratumumab/carfilzomib/Decadron changed to every 2-week dosing beginning 05/25/2019  2. 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 History of intermittent prostatitis  4. Skin rash 10/24/2014-referred to dermatology, biopsy consistent with local hypersensitivity reaction  5. 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 Left lower leg cellulitis 06/24/2015-treated with Keflex  7. History of mild thrombocytopenia secondary tomyeloma and systemic therapy  8. History of mild neutropenia-secondary to myeloma and systemic therapy  9. Fall with a skull fracture and subarachnoid blood/right frontal lobe contusions 10/17/2015  10. Right lower lobe pulmonary embolus 03/28/2016. Lovenox , transitioned to Xarelto  11. Vesicular rashJune 2018-potentially related to the zoster vaccine, resolved  12. History of atrial fibrillation  13. Aortic stenosis  14.  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  He will continue every 2-week daratumumab/carfilzomib/Decadron.  He is tolerating the treatment well.  He has mild discomfort at the right upper arm and right pelvis.  We reviewed the PET images from March.  There are no apparent lesions at the right humerus or right iliac.  He will continue his exercise program.  He will call for increased pain or new symptoms.  [name] will return for an office visit and a myeloma panel in 2 weeks.  He will continue every 68-monthZometa.    GBetsy Coder MD  07/27/2019  12:17 PM

## 2019-07-28 ENCOUNTER — Telehealth: Payer: Self-pay | Admitting: Oncology

## 2019-07-28 NOTE — Telephone Encounter (Signed)
Scheduled appts per 6/30 los. Pt to get updated appt calendar if needed.

## 2019-08-06 ENCOUNTER — Other Ambulatory Visit: Payer: Self-pay | Admitting: Oncology

## 2019-08-10 ENCOUNTER — Encounter: Payer: Self-pay | Admitting: Nurse Practitioner

## 2019-08-10 ENCOUNTER — Other Ambulatory Visit: Payer: Self-pay

## 2019-08-10 ENCOUNTER — Inpatient Hospital Stay: Payer: Medicare PPO | Attending: Oncology

## 2019-08-10 ENCOUNTER — Telehealth: Payer: Self-pay | Admitting: Nurse Practitioner

## 2019-08-10 ENCOUNTER — Inpatient Hospital Stay: Payer: Medicare PPO

## 2019-08-10 ENCOUNTER — Inpatient Hospital Stay (HOSPITAL_BASED_OUTPATIENT_CLINIC_OR_DEPARTMENT_OTHER): Payer: Medicare PPO | Admitting: Nurse Practitioner

## 2019-08-10 VITALS — BP 127/65 | HR 64 | Temp 97.7°F | Resp 17 | Ht 71.0 in | Wt 174.8 lb

## 2019-08-10 DIAGNOSIS — Z5111 Encounter for antineoplastic chemotherapy: Secondary | ICD-10-CM | POA: Diagnosis not present

## 2019-08-10 DIAGNOSIS — C9000 Multiple myeloma not having achieved remission: Secondary | ICD-10-CM

## 2019-08-10 DIAGNOSIS — Z923 Personal history of irradiation: Secondary | ICD-10-CM | POA: Insufficient documentation

## 2019-08-10 DIAGNOSIS — Z79899 Other long term (current) drug therapy: Secondary | ICD-10-CM | POA: Diagnosis not present

## 2019-08-10 LAB — CMP (CANCER CENTER ONLY)
ALT: 10 U/L (ref 0–44)
AST: 14 U/L — ABNORMAL LOW (ref 15–41)
Albumin: 3.3 g/dL — ABNORMAL LOW (ref 3.5–5.0)
Alkaline Phosphatase: 38 U/L (ref 38–126)
Anion gap: 7 (ref 5–15)
BUN: 8 mg/dL (ref 8–23)
CO2: 25 mmol/L (ref 22–32)
Calcium: 9.1 mg/dL (ref 8.9–10.3)
Chloride: 110 mmol/L (ref 98–111)
Creatinine: 0.72 mg/dL (ref 0.61–1.24)
GFR, Est AFR Am: >60 mL/min (ref >60)
GFR, Estimated: >60 mL/min (ref >60)
Glucose, Bld: 102 mg/dL — ABNORMAL HIGH (ref 70–99)
Potassium: 4 mmol/L (ref 3.5–5.1)
Sodium: 142 mmol/L (ref 135–145)
Total Bilirubin: 0.6 mg/dL (ref 0.3–1.2)
Total Protein: 5.7 g/dL — ABNORMAL LOW (ref 6.5–8.1)

## 2019-08-10 LAB — CBC WITH DIFFERENTIAL (CANCER CENTER ONLY)
Abs Immature Granulocytes: 0.01 10*3/uL (ref 0.00–0.07)
Basophils Absolute: 0 10*3/uL (ref 0.0–0.1)
Basophils Relative: 1 %
Eosinophils Absolute: 0 10*3/uL (ref 0.0–0.5)
Eosinophils Relative: 1 %
HCT: 40 % (ref 39.0–52.0)
Hemoglobin: 13.3 g/dL (ref 13.0–17.0)
Immature Granulocytes: 0 %
Lymphocytes Relative: 12 %
Lymphs Abs: 0.4 10*3/uL — ABNORMAL LOW (ref 0.7–4.0)
MCH: 32.4 pg (ref 26.0–34.0)
MCHC: 33.3 g/dL (ref 30.0–36.0)
MCV: 97.3 fL (ref 80.0–100.0)
Monocytes Absolute: 0.4 10*3/uL (ref 0.1–1.0)
Monocytes Relative: 11 %
Neutro Abs: 2.7 10*3/uL (ref 1.7–7.7)
Neutrophils Relative %: 75 %
Platelet Count: 92 10*3/uL — ABNORMAL LOW (ref 150–400)
RBC: 4.11 MIL/uL — ABNORMAL LOW (ref 4.22–5.81)
RDW: 13.1 % (ref 11.5–15.5)
WBC Count: 3.6 10*3/uL — ABNORMAL LOW (ref 4.0–10.5)
nRBC: 0 % (ref 0.0–0.2)

## 2019-08-10 MED ORDER — DARATUMUMAB-HYALURONIDASE-FIHJ 1800-30000 MG-UT/15ML ~~LOC~~ SOLN
1800.0000 mg | Freq: Once | SUBCUTANEOUS | Status: AC
  Administered 2019-08-10: 1800 mg via SUBCUTANEOUS
  Filled 2019-08-10: qty 15

## 2019-08-10 MED ORDER — DEXTROSE 5 % IV SOLN
56.0000 mg/m2 | Freq: Once | INTRAVENOUS | Status: AC
  Administered 2019-08-10: 120 mg via INTRAVENOUS
  Filled 2019-08-10: qty 60

## 2019-08-10 MED ORDER — MONTELUKAST SODIUM 10 MG PO TABS
10.0000 mg | ORAL_TABLET | Freq: Once | ORAL | Status: AC
  Administered 2019-08-10: 10 mg via ORAL

## 2019-08-10 MED ORDER — MONTELUKAST SODIUM 10 MG PO TABS
ORAL_TABLET | ORAL | Status: AC
  Filled 2019-08-10: qty 1

## 2019-08-10 MED ORDER — DEXAMETHASONE 4 MG PO TABS
ORAL_TABLET | ORAL | Status: AC
  Filled 2019-08-10: qty 5

## 2019-08-10 MED ORDER — DIPHENHYDRAMINE HCL 25 MG PO CAPS
ORAL_CAPSULE | ORAL | Status: AC
  Filled 2019-08-10: qty 2

## 2019-08-10 MED ORDER — DEXAMETHASONE 4 MG PO TABS
20.0000 mg | ORAL_TABLET | Freq: Once | ORAL | Status: AC
  Administered 2019-08-10: 20 mg via ORAL

## 2019-08-10 MED ORDER — DIPHENHYDRAMINE HCL 25 MG PO CAPS
50.0000 mg | ORAL_CAPSULE | Freq: Once | ORAL | Status: AC
  Administered 2019-08-10: 50 mg via ORAL

## 2019-08-10 MED ORDER — ACETAMINOPHEN 325 MG PO TABS
650.0000 mg | ORAL_TABLET | Freq: Once | ORAL | Status: AC
  Administered 2019-08-10: 650 mg via ORAL

## 2019-08-10 MED ORDER — ACETAMINOPHEN 325 MG PO TABS
ORAL_TABLET | ORAL | Status: AC
  Filled 2019-08-10: qty 2

## 2019-08-10 MED ORDER — SODIUM CHLORIDE 0.9 % IV SOLN
INTRAVENOUS | Status: DC
  Filled 2019-08-10: qty 250

## 2019-08-10 NOTE — Progress Notes (Signed)
Per Ned Card, NP, Ok to treat with today's platelet count.

## 2019-08-10 NOTE — Telephone Encounter (Signed)
Scheduled per 7/14 los. Messaged RN Benjie Karvonen to give pt updated calendar.

## 2019-08-10 NOTE — Progress Notes (Addendum)
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  He completed another cycle of daratumumab and carfilzomib 07/27/2019.  He denies nausea/vomiting.  No mouth sores.  No change in baseline bowel habits.  Mild intermittent discomfort right arm.  He remains active.  Objective:  Vital signs in last 24 hours:  Blood pressure 127/65, pulse 64, temperature 97.7 F (36.5 C), temperature source Temporal, resp. rate 17, height '5\' 11"'  (1.803 m), weight 174 lb 12.8 oz (79.3 kg), SpO2 98 %.    HEENT: No thrush or ulcers. Resp: Lungs clear bilaterally. Cardio: Regular rate and rhythm.  2/6 systolic murmur. GI: Abdomen soft and nontender.  No hepatosplenomegaly. Vascular: Trace edema at the lower legs bilaterally left greater than right. Skin: Mild erythem/dryness left lower leg   Lab Results:  Lab Results  Component Value Date   WBC 3.6 (L) 08/10/2019   HGB 13.3 08/10/2019   HCT 40.0 08/10/2019   MCV 97.3 08/10/2019   PLT 92 (L) 08/10/2019   NEUTROABS 2.7 08/10/2019    Imaging:  No results found.  Medications: I have reviewed the patient's current medications.  Assessment/Plan: 1. Multiple myeloma-confirmed on a bone marrow biopsy 08/07/2014, IgA lambda  Serum M spike and increased serum free lambda light chains  Myeloma FISH panel negative for chromosome 4, 11, 12, 13, 14, and 17 abnormalities, cytogenetics with no metaphases  Bone survey 08/15/2014 with indeterminant lucent skull lesions  Cycle 1 RVD 08/22/2014  Cycle 2 RVD 09/19/2014  Cycle 3 RVD 10/17/2014  Cycle 4 RVD 11/14/2014 (Decadron reduced to 20 mg weekly, Revlimid 15 mg days 1 through 14, Velcade 3/4 weeks)  Cycle 5 RVD 12/12/2014  Serum M spike not detected 12/26/2014  Cycle 6 RVD 01/09/2015  Maintenance Revlimid, 10 mg daily, 02/05/2015 , discontinue 02/26/2015 secondary to leg edema and diarrhea  Revlimid resumed  at a dose of 10 mg every other day beginning 03/13/2015  PET scan 57/32/2025-[place] hypermetabolic bone lesions, few lucent lesions in the spine and pelvis  Revlimid discontinued January 2019  Enrollment on a clinical trial at [facility] with pomalidomide/Decadron+/- ixazomibbeginning 03/10/2017  Treatment discontinued February 2021 secondary to rising serum lambda light chains  PET 04/05/2019-hypermetabolic right axillary/chest wall nodes, right paravertebral mass, small right pleural effusion, retrocrural node, right lateral abdominal wall mass, L5 lesion mild compression deformity. Focus of hypermetabolism at the anterior left pelvic wall without a CT correlate. Hypermetabolic lytic lesion in the left T3 transverse process  Cycle 1 daratumumab/carfilzomib/Decadron 04/05/2019  Radiation to the right chest wall mass and lumbar spine beginning 04/25/2019  Cycle 2 daratumumab/carfilzomib/Decadron 05/04/2019 (carfilzomib dose escalated); day 8 held due to neutropenia, thrombocytopenia; day 15 05/18/2019 (carfilzomib dose reduced)  Daratumumab/carfilzomib/Decadron changed to every 2-week dosing beginning 05/25/2019  2. 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 History of intermittent prostatitis  4. Skin rash 10/24/2014-referred to dermatology, biopsy consistent with local hypersensitivity reaction  5. 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 Left lower leg cellulitis 06/24/2015-treated with Keflex  7. History of mild thrombocytopenia secondary tomyeloma and systemic therapy  8. History of mild neutropenia-secondary to myeloma and systemic therapy  9. Fall with a skull fracture and subarachnoid blood/right frontal lobe  contusions 10/17/2015  10. Right lower lobe pulmonary embolus  03/28/2016. Lovenox , transitioned to Xarelto  11. Vesicular rashJune 2018-potentially related to the zoster vaccine, resolved  12. History of atrial fibrillation  13. Aortic stenosis  14. 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  Plan to continue every 2-week daratumumab/carfilzomib/Decadron.  He continues to tolerate treatment well.  We will follow-up on the myeloma panel from today.  Next Zometa due September 2021.  We reviewed the CBC from today.  Labs adequate to proceed.  He has mild thrombocytopenia.  He will return for lab, follow-up, treatment in 2 weeks.  He will contact the office in the interim with any problems.  Patient seen with [name].    Ned Card ANP/GNP-BC   08/10/2019  11:54 AM This was a shared visit with Ned Card.  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  We will follow up on the myeloma panel from today.  He will return for an office visit in 2 weeks.  He is scheduled to see Dr. Amalia Hailey next month.  We will communicate with Dr. Amalia Hailey regarding the indication for continuing the 2-week regimen versus changing to monthly daratumumab.  Julieanne Manson, MD

## 2019-08-10 NOTE — Patient Instructions (Signed)
[facility] Discharge Instructions for Patients Receiving Chemotherapy  Today you received the following chemotherapy agents: Kyprolis, Darzalex Faspro  To help prevent nausea and vomiting after your treatment, we encourage you to take your nausea medication as directed.    If you develop nausea and vomiting that is not controlled by your nausea medication, call the clinic.   BELOW ARE SYMPTOMS THAT SHOULD BE REPORTED IMMEDIATELY:  *FEVER GREATER THAN 100.5 F  *CHILLS WITH OR WITHOUT FEVER  NAUSEA AND VOMITING THAT IS NOT CONTROLLED WITH YOUR NAUSEA MEDICATION  *UNUSUAL SHORTNESS OF BREATH  *UNUSUAL BRUISING OR BLEEDING  TENDERNESS IN MOUTH AND THROAT WITH OR WITHOUT PRESENCE OF ULCERS  *URINARY PROBLEMS  *BOWEL PROBLEMS  UNUSUAL RASH Items with * indicate a potential emergency and should be followed up as soon as possible.  Feel free to call the clinic should you have any questions or concerns. The clinic phone number is [phone].  Please show the [place] at check-in to the Emergency Department and triage nurse.

## 2019-08-11 LAB — KAPPA/LAMBDA LIGHT CHAINS
Kappa free light chain: 1.6 mg/L — ABNORMAL LOW (ref 3.3–19.4)
Kappa, lambda light chain ratio: 0.57 (ref 0.26–1.65)
Lambda free light chains: 2.8 mg/L — ABNORMAL LOW (ref 5.7–26.3)

## 2019-08-11 LAB — IGA: IgA: 6 mg/dL — ABNORMAL LOW (ref 61–437)

## 2019-08-12 LAB — PROTEIN ELECTROPHORESIS, SERUM
A/G Ratio: 1.7 (ref 0.7–1.7)
Albumin ELP: 3.3 g/dL (ref 2.9–4.4)
Alpha-1-Globulin: 0.2 g/dL (ref 0.0–0.4)
Alpha-2-Globulin: 0.7 g/dL (ref 0.4–1.0)
Beta Globulin: 0.8 g/dL (ref 0.7–1.3)
Gamma Globulin: 0.3 g/dL — ABNORMAL LOW (ref 0.4–1.8)
Globulin, Total: 2 g/dL — ABNORMAL LOW (ref 2.2–3.9)
M-Spike, %: 0.1 g/dL — ABNORMAL HIGH
Total Protein ELP: 5.3 g/dL — ABNORMAL LOW (ref 6.0–8.5)

## 2019-08-17 DIAGNOSIS — M25511 Pain in right shoulder: Secondary | ICD-10-CM | POA: Diagnosis not present

## 2019-08-17 DIAGNOSIS — M25551 Pain in right hip: Secondary | ICD-10-CM | POA: Diagnosis not present

## 2019-08-21 ENCOUNTER — Other Ambulatory Visit: Payer: Self-pay | Admitting: Oncology

## 2019-08-24 ENCOUNTER — Other Ambulatory Visit: Payer: Self-pay

## 2019-08-24 ENCOUNTER — Encounter: Payer: Self-pay | Admitting: Nurse Practitioner

## 2019-08-24 ENCOUNTER — Inpatient Hospital Stay: Payer: Medicare PPO

## 2019-08-24 ENCOUNTER — Inpatient Hospital Stay (HOSPITAL_BASED_OUTPATIENT_CLINIC_OR_DEPARTMENT_OTHER): Payer: Medicare PPO | Admitting: Nurse Practitioner

## 2019-08-24 ENCOUNTER — Ambulatory Visit: Payer: Medicare PPO

## 2019-08-24 ENCOUNTER — Ambulatory Visit: Payer: Medicare PPO | Admitting: Nurse Practitioner

## 2019-08-24 ENCOUNTER — Other Ambulatory Visit: Payer: Medicare PPO

## 2019-08-24 VITALS — BP 116/72 | HR 68 | Temp 97.9°F | Resp 17 | Ht 71.0 in | Wt 173.7 lb

## 2019-08-24 DIAGNOSIS — C9000 Multiple myeloma not having achieved remission: Secondary | ICD-10-CM | POA: Diagnosis not present

## 2019-08-24 DIAGNOSIS — Z79899 Other long term (current) drug therapy: Secondary | ICD-10-CM | POA: Diagnosis not present

## 2019-08-24 DIAGNOSIS — Z923 Personal history of irradiation: Secondary | ICD-10-CM | POA: Diagnosis not present

## 2019-08-24 DIAGNOSIS — Z5111 Encounter for antineoplastic chemotherapy: Secondary | ICD-10-CM | POA: Diagnosis not present

## 2019-08-24 LAB — CBC WITH DIFFERENTIAL (CANCER CENTER ONLY)
Abs Immature Granulocytes: 0.01 10*3/uL (ref 0.00–0.07)
Basophils Absolute: 0 10*3/uL (ref 0.0–0.1)
Basophils Relative: 1 %
Eosinophils Absolute: 0.1 10*3/uL (ref 0.0–0.5)
Eosinophils Relative: 2 %
HCT: 40.2 % (ref 39.0–52.0)
Hemoglobin: 13.6 g/dL (ref 13.0–17.0)
Immature Granulocytes: 0 %
Lymphocytes Relative: 20 %
Lymphs Abs: 0.7 10*3/uL (ref 0.7–4.0)
MCH: 32.7 pg (ref 26.0–34.0)
MCHC: 33.8 g/dL (ref 30.0–36.0)
MCV: 96.6 fL (ref 80.0–100.0)
Monocytes Absolute: 0.5 10*3/uL (ref 0.1–1.0)
Monocytes Relative: 14 %
Neutro Abs: 2.1 10*3/uL (ref 1.7–7.7)
Neutrophils Relative %: 63 %
Platelet Count: 96 10*3/uL — ABNORMAL LOW (ref 150–400)
RBC: 4.16 MIL/uL — ABNORMAL LOW (ref 4.22–5.81)
RDW: 13 % (ref 11.5–15.5)
WBC Count: 3.4 10*3/uL — ABNORMAL LOW (ref 4.0–10.5)
nRBC: 0 % (ref 0.0–0.2)

## 2019-08-24 LAB — CMP (CANCER CENTER ONLY)
ALT: 11 U/L (ref 0–44)
AST: 16 U/L (ref 15–41)
Albumin: 3.4 g/dL — ABNORMAL LOW (ref 3.5–5.0)
Alkaline Phosphatase: 36 U/L — ABNORMAL LOW (ref 38–126)
Anion gap: 8 (ref 5–15)
BUN: 7 mg/dL — ABNORMAL LOW (ref 8–23)
CO2: 24 mmol/L (ref 22–32)
Calcium: 9.2 mg/dL (ref 8.9–10.3)
Chloride: 111 mmol/L (ref 98–111)
Creatinine: 0.71 mg/dL (ref 0.61–1.24)
GFR, Est AFR Am: >60 mL/min (ref >60)
GFR, Estimated: >60 mL/min (ref >60)
Glucose, Bld: 93 mg/dL (ref 70–99)
Potassium: 3.7 mmol/L (ref 3.5–5.1)
Sodium: 143 mmol/L (ref 135–145)
Total Bilirubin: 0.8 mg/dL (ref 0.3–1.2)
Total Protein: 5.6 g/dL — ABNORMAL LOW (ref 6.5–8.1)

## 2019-08-24 MED ORDER — DIPHENHYDRAMINE HCL 25 MG PO CAPS
50.0000 mg | ORAL_CAPSULE | Freq: Once | ORAL | Status: AC
  Administered 2019-08-24: 50 mg via ORAL

## 2019-08-24 MED ORDER — DARATUMUMAB-HYALURONIDASE-FIHJ 1800-30000 MG-UT/15ML ~~LOC~~ SOLN
1800.0000 mg | Freq: Once | SUBCUTANEOUS | Status: AC
  Administered 2019-08-24: 1800 mg via SUBCUTANEOUS
  Filled 2019-08-24: qty 15

## 2019-08-24 MED ORDER — SODIUM CHLORIDE 0.9 % IV SOLN
INTRAVENOUS | Status: DC
  Filled 2019-08-24: qty 250

## 2019-08-24 MED ORDER — DEXAMETHASONE 4 MG PO TABS
20.0000 mg | ORAL_TABLET | Freq: Once | ORAL | Status: AC
  Administered 2019-08-24: 20 mg via ORAL

## 2019-08-24 MED ORDER — ACETAMINOPHEN 325 MG PO TABS
ORAL_TABLET | ORAL | Status: AC
  Filled 2019-08-24: qty 2

## 2019-08-24 MED ORDER — DIPHENHYDRAMINE HCL 25 MG PO CAPS
ORAL_CAPSULE | ORAL | Status: AC
  Filled 2019-08-24: qty 2

## 2019-08-24 MED ORDER — DEXTROSE 5 % IV SOLN
56.0000 mg/m2 | Freq: Once | INTRAVENOUS | Status: AC
  Administered 2019-08-24: 120 mg via INTRAVENOUS
  Filled 2019-08-24: qty 60

## 2019-08-24 MED ORDER — ACETAMINOPHEN 325 MG PO TABS
650.0000 mg | ORAL_TABLET | Freq: Once | ORAL | Status: AC
  Administered 2019-08-24: 650 mg via ORAL

## 2019-08-24 MED ORDER — MONTELUKAST SODIUM 10 MG PO TABS
ORAL_TABLET | ORAL | Status: AC
  Filled 2019-08-24: qty 1

## 2019-08-24 MED ORDER — MONTELUKAST SODIUM 10 MG PO TABS
10.0000 mg | ORAL_TABLET | Freq: Once | ORAL | Status: AC
  Administered 2019-08-24: 10 mg via ORAL

## 2019-08-24 MED ORDER — DEXAMETHASONE 4 MG PO TABS
ORAL_TABLET | ORAL | Status: AC
  Filled 2019-08-24: qty 5

## 2019-08-24 NOTE — Progress Notes (Signed)
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 He completed another cycle of daratumumab and carfilzomib 08/10/2019.  He denies nausea/vomiting.  No change in baseline bowel habits.  He remains very active.  He denies significant dyspnea.  No cough.  He has noticed when he is walking up a hill he may experience mild chest tightness.  No other associated symptoms.  He continues to have discomfort at the right hip and right upper arm.  He reports negative plain x-rays.  He has been referred to physical therapy.  The right hip discomfort improves with activity.  Objective:  Vital signs in last 24 hours:  Blood pressure 116/72, pulse 68, temperature 97.9 F (36.6 C), temperature source Temporal, resp. rate 17, height 5' 11" (1.803 m), weight 173 lb 11.2 oz (78.8 kg), SpO2 98 %.    HEENT: No thrush or ulcers. Lymphatics: No palpable cervical or supraclavicular lymph nodes. Resp: Lungs clear bilaterally. Cardio: Regular rate and rhythm. GI: Abdomen soft and nontender.  No hepatosplenomegaly. Vascular: Trace edema at the lower legs bilaterally.   Lab Results:  Lab Results  Component Value Date   WBC 3.4 (L) 08/24/2019   HGB 13.6 08/24/2019   HCT 40.2 08/24/2019   MCV 96.6 08/24/2019   PLT 96 (L) 08/24/2019   NEUTROABS 2.1 08/24/2019    Imaging:  No results found.  Medications: I have reviewed the patient's current medications.  Assessment/Plan: 1. Multiple myeloma-confirmed on a bone marrow biopsy 08/07/2014, IgA lambda  Serum M spike and increased serum free lambda light chains  Myeloma FISH panel negative for chromosome 4, 11, 12, 13, 14, and 17 abnormalities, cytogenetics with no metaphases  Bone survey 08/15/2014 with indeterminant lucent skull lesions  Cycle 1 RVD 08/22/2014  Cycle 2 RVD 09/19/2014  Cycle 3 RVD 10/17/2014  Cycle 4 RVD 11/14/2014 (Decadron reduced to 20 mg  weekly, Revlimid 15 mg days 1 through 14, Velcade 3/4 weeks)  Cycle 5 RVD 12/12/2014  Serum M spike not detected 12/26/2014  Cycle 6 RVD 01/09/2015  Maintenance Revlimid, 10 mg daily, 02/05/2015 , discontinue 02/26/2015 secondary to leg edema and diarrhea  Revlimid resumed at a dose of 10 mg every other day beginning 03/13/2015  PET scan 16/10/9602-VW hypermetabolic bone lesions, few lucent lesions in the spine and pelvis  Revlimid discontinued January 2019  Enrollment on a clinical trial at [facility] with pomalidomide/Decadron+/- ixazomibbeginning 03/10/2017  Treatment discontinued February 2021 secondary to rising serum lambda light chains  PET 04/05/2019-hypermetabolic right axillary/chest wall nodes, right paravertebral mass, small right pleural effusion, retrocrural node, right lateral abdominal wall mass, L5 lesion mild compression deformity. Focus of hypermetabolism at the anterior left pelvic wall without a CT correlate. Hypermetabolic lytic lesion in the left T3 transverse process  Cycle 1 daratumumab/carfilzomib/Decadron 04/05/2019  Radiation to the right chest wall mass and lumbar spine beginning 04/25/2019  Cycle 2 daratumumab/carfilzomib/Decadron 05/04/2019 (carfilzomib dose escalated); day 8 held due to neutropenia, thrombocytopenia; day 15 05/18/2019 (carfilzomib dose reduced)  Daratumumab/carfilzomib/Decadron changed to every 2-week dosing beginning 05/25/2019  2. 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 History of intermittent prostatitis  4. Skin rash 10/24/2014-referred to dermatology, biopsy consistent with local hypersensitivity reaction  5. 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 Left  lower leg  cellulitis 06/24/2015-treated with Keflex  7. History of mild thrombocytopenia secondary tomyeloma and systemic therapy  8. History of mild neutropenia-secondary to myeloma and systemic therapy  9. Fall with a skull fracture and subarachnoid blood/right frontal lobe contusions 10/17/2015  10. Right lower lobe pulmonary embolus 03/28/2016. Lovenox , transitioned to Xarelto  11. Vesicular rashJune 2018-potentially related to the zoster vaccine, resolved  12. History of atrial fibrillation  13. Aortic stenosis  14. 07/02/2019-hospital admission for sepsis secondary to cellulitis of the left lower extremity with gram-negative rod bacteremia(Aeromonascaviae)  Disposition: Mitchell Lowery appears stable.  The plan is to continue every 2-week daratumumab/carfilzomib/Decadron.  We reviewed the CBC and chemistry panel from today.  Labs adequate to proceed with treatment.  He will discuss the chest tightness with Dr. Cooper at his appointment next week.  He will return for lab, follow-up, treatment in 2 weeks.  He will contact the office in the interim with any problems.    Mitchell Lowery ANP/GNP-BC   08/24/2019  11:17 AM        

## 2019-08-24 NOTE — Patient Instructions (Signed)
[facility] Discharge Instructions for Patients Receiving Chemotherapy  Today you received the following chemotherapy agents: Kyprolis/Darzalex Faspro.  To help prevent nausea and vomiting after your treatment, we encourage you to take your nausea medication as directed.   If you develop nausea and vomiting that is not controlled by your nausea medication, call the clinic.   BELOW ARE SYMPTOMS THAT SHOULD BE REPORTED IMMEDIATELY:  *FEVER GREATER THAN 100.5 F  *CHILLS WITH OR WITHOUT FEVER  NAUSEA AND VOMITING THAT IS NOT CONTROLLED WITH YOUR NAUSEA MEDICATION  *UNUSUAL SHORTNESS OF BREATH  *UNUSUAL BRUISING OR BLEEDING  TENDERNESS IN MOUTH AND THROAT WITH OR WITHOUT PRESENCE OF ULCERS  *URINARY PROBLEMS  *BOWEL PROBLEMS  UNUSUAL RASH Items with * indicate a potential emergency and should be followed up as soon as possible.  Feel free to call the clinic should you have any questions or concerns. The clinic phone number is [phone].  Please show the CHEMO ALERT CARD at check-in to the Emergency Department and triage nurse.   

## 2019-08-25 ENCOUNTER — Telehealth: Payer: Self-pay | Admitting: Oncology

## 2019-08-25 DIAGNOSIS — M7061 Trochanteric bursitis, right hip: Secondary | ICD-10-CM | POA: Diagnosis not present

## 2019-08-25 DIAGNOSIS — R531 Weakness: Secondary | ICD-10-CM | POA: Diagnosis not present

## 2019-08-25 MED FILL — XARELTO 20 MG TABLET: 20 | 30 days supply | Qty: 30 | Fill #5

## 2019-08-25 NOTE — Telephone Encounter (Signed)
Scheduled appointments per 7/28 los. Patient is aware of appointments date and time.

## 2019-08-29 DIAGNOSIS — R531 Weakness: Secondary | ICD-10-CM | POA: Diagnosis not present

## 2019-08-29 DIAGNOSIS — M7061 Trochanteric bursitis, right hip: Secondary | ICD-10-CM | POA: Diagnosis not present

## 2019-08-31 ENCOUNTER — Other Ambulatory Visit: Payer: Self-pay | Admitting: Oncology

## 2019-09-01 ENCOUNTER — Ambulatory Visit (INDEPENDENT_AMBULATORY_CARE_PROVIDER_SITE_OTHER): Payer: Medicare PPO | Admitting: Cardiovascular Disease

## 2019-09-01 ENCOUNTER — Ambulatory Visit (HOSPITAL_COMMUNITY): Payer: Medicare PPO | Attending: Internal Medicine

## 2019-09-01 ENCOUNTER — Other Ambulatory Visit: Payer: Self-pay

## 2019-09-01 ENCOUNTER — Telehealth: Payer: Self-pay

## 2019-09-01 ENCOUNTER — Encounter: Payer: Self-pay | Admitting: Cardiovascular Disease

## 2019-09-01 VITALS — BP 134/68 | HR 69 | Ht 71.0 in | Wt 175.6 lb

## 2019-09-01 DIAGNOSIS — I480 Paroxysmal atrial fibrillation: Secondary | ICD-10-CM | POA: Diagnosis not present

## 2019-09-01 DIAGNOSIS — I10 Essential (primary) hypertension: Secondary | ICD-10-CM | POA: Diagnosis not present

## 2019-09-01 DIAGNOSIS — I350 Nonrheumatic aortic (valve) stenosis: Secondary | ICD-10-CM

## 2019-09-01 NOTE — Telephone Encounter (Signed)
Per Dr. Burt Knack, called patient and cancelled COVID test and cath for next week and he has family coming to town. Will schedule TAVR scans week of 8/16 and move cath to first available in September.

## 2019-09-01 NOTE — H&P (View-Only) (Signed)
Cardiology Office Note:    Date:  09/02/2019   ID:  Mitchell Lowery, DOB 12/17/1934, MRN [id number]  PCP:  [name], MD  [facility] Cardiologist:  Sherren Mocha, MD  [place] Electrophysiologist:  None   Referring MD: [name], MD   Chief Complaint  Patient presents with  . Atrial Flutter    History of Present Illness:    Mitchell Lowery is a 85 y.o. male with a hx of  paroxysmal atrial fibrillation and moderate aortic stenosis, presenting for follow-up evaluation.  The patient has been anticoagulated with rivaroxaban.  He has had a relatively low atrial fibrillation burden based on outpatient telemetry monitoring. The patient was hospitalized in June with cellulitis and bacteremia. He did not have any cardiac-related issues during that hospitalization. He did develop thrombocytopenia and required platelet transfusion. He has recovered well and is back to his regular exercise program. He uses a rowing machine several days per week, with peak heart rates in the 120's, usually rowing a total of 5000 meters with a warmup and 6 or 8 intervals. He has no symptoms with that level of exertion. However, with walking up a hill he has developed discomfort and pressure across his chest over the past 6 months. Symptoms resolve over several minutes of rest and typically do not recur with continued walking. He does not feel the symptoms are progressive but has only noticed them for a period of 6 months. No resting chest pain or pressure. No dyspnea, orthopnea, or PND.  He brings in home blood pressure and pulse readings and they are in an excellent range.  Past Medical History:  Diagnosis Date  . Anal fistula    treated in [place]  . Anemia    only due to myeloma- "normal now"  . Heart murmur   . Hemorrhoids   . Multiple myeloma (HCC)    1 month remission now-last tx. 1 month ago- /[name]  . Prostate cancer [facility])    urinary retention, surgery planned-prior radiation, and  seed implant about 1 year ago.  . Prostatitis   . Pulmonary embolus, left ([place]) 03/28/2016  . UTI (lower urinary tract infection)    multiple urinary trract infection- being tx presently with antibiotic at present.    Past Surgical History:  Procedure Laterality Date  . ANAL FISTULECTOMY    . ANKLE SURGERY    . CYSTOSCOPY    . CYSTOSCOPY WITH INSERTION OF UROLIFT N/A 03/12/2015   Procedure: CYSTOSCOPY WITH INSERTION OF UROLIFT;  Surgeon: Franchot Gallo, MD;  Location: [facility];  Service: Urology;  Laterality: N/A;  . PROSTATE BIOPSY    . PROSTATE BIOPSY    . RADIOACTIVE SEED IMPLANT N/A 07/21/2014   Procedure: RADIOACTIVE SEED IMPLANT/BRACHYTHERAPY IMPLANT;  Surgeon: Franchot Gallo, MD;  Location: [facility];  Service: Urology;  Laterality: N/A;  . TONSILLECTOMY      Current Medications: Current Meds  Medication Sig  . Cholecalciferol 2000 units TABS Take 1 tablet by mouth every evening.   . diphenoxylate-atropine (LOMOTIL) 2.5-0.025 MG tablet Take 1 tablet by mouth daily as needed for diarrhea or loose stools. Usually takes 1 tablet a week  . feeding supplement, ENSURE ENLIVE, (ENSURE ENLIVE) LIQD Take 237 mLs by mouth 2 (two) times daily between meals.  . hydrOXYzine (ATARAX/VISTARIL) 25 MG tablet Take 1 tablet (25 mg total) by mouth at bedtime as needed for anxiety (insomnia).  . losartan (COZAAR) 50 MG tablet Take 50 mg by mouth daily.   Marland Kitchen  ondansetron (ZOFRAN) 4 MG tablet Take 1 tablet (4 mg total) by mouth every 6 (six) hours as needed for nausea.  . Psyllium (QC NATURAL VEGETABLE) 95 % POWD Take 1 packet by mouth daily.   . sennosides-docusate sodium (SENOKOT-S) 8.6-50 MG tablet Take 1 tablet by mouth daily as needed for constipation.   . valACYclovir (VALTREX) 500 MG tablet Take 500 mg by mouth daily.   . vitamin B-12 (CYANOCOBALAMIN) 500 MCG tablet Take 500 mcg by mouth daily.  Alveda Reasons 20 MG TABS tablet TAKE 1 TABLET BY MOUTH DAILY WITH SUPPER.      Allergies:   Bee venom   Social History   Socioeconomic History  . Marital status: Married    Spouse name: Not on file  . Number of children: Not on file  . Years of education: Not on file  . Highest education level: Not on file  Occupational History  . Not on file  Tobacco Use  . Smoking status: Never Smoker  . Smokeless tobacco: Never Used  Vaping Use  . Vaping Use: Never used  Substance and Sexual Activity  . Alcohol use: Yes    Comment: 1 glass wine daily  . Drug use: No  . Sexual activity: Yes  Other Topics Concern  . Not on file  Social History Narrative  . Not on file   Social Determinants of Health   Financial Resource Strain:   . Difficulty of Paying Living Expenses:   Food Insecurity:   . Worried About Charity fundraiser in the Last Year:   . Arboriculturist in the Last Year:   Transportation Needs:   . Film/video editor (Medical):   Marland Kitchen Lack of Transportation (Non-Medical):   Physical Activity:   . Days of Exercise per Week:   . Minutes of Exercise per Session:   Stress:   . Feeling of Stress :   Social Connections:   . Frequency of Communication with Friends and Family:   . Frequency of Social Gatherings with Friends and Family:   . Attends Religious Services:   . Active Member of Clubs or Organizations:   . Attends Archivist Meetings:   Marland Kitchen Marital Status:      Family History: The patient's family history includes Cancer in his maternal grandfather.  ROS:   Please see the history of present illness.    Positive for poor balance. No falls in 24 months. Using a walking stick. All other systems reviewed and are negative.  EKGs/Labs/Other Studies Reviewed:    The following studies were reviewed today: Echo: IMPRESSIONS    1. Severe aortic valve stenosis. Severely calcified aortic valve.  Dimensionless index 0.24. Maximum gradient obtained from apical window.  The aortic valve is abnormal. Aortic valve regurgitation is  trivial.  Aortic valve area, by VTI measures 0.75 cm.  Aortic valve mean gradient measures 40.0 mmHg. Aortic valve Vmax measures  4.12 m/s.  2. Left ventricular ejection fraction, by estimation, is 55 to 60%. The  left ventricle has normal function. The left ventricle has no regional  wall motion abnormalities. There is moderate left ventricular hypertrophy.  Left ventricular diastolic  parameters are consistent with Grade I diastolic dysfunction (impaired  relaxation).  3. Right ventricular systolic function is normal. The right ventricular  size is normal. Tricuspid regurgitation signal is inadequate for assessing  PA pressure.  4. The mitral valve is degenerative. Trivial mitral valve regurgitation.  Mild mitral stenosis. The mean mitral valve gradient  is 2.0 mmHg with  average heart rate of 59 bpm.  5. The inferior vena cava is normal in size with greater than 50%  respiratory variability, suggesting right atrial pressure of 3 mmHg.   Comparison(s): A prior study was performed on 08/16/18. Aortic valve  stenosis is now severe.   EKG:  EKG is not ordered today.    Recent Labs: 02/28/2019: Magnesium 1.7 08/24/2019: ALT 11; BUN 7; Creatinine 0.71; Hemoglobin 13.6; Platelet Count 96; Potassium 3.7; Sodium 143  Recent Lipid Panel No results found for: CHOL, TRIG, HDL, CHOLHDL, VLDL, LDLCALC, LDLDIRECT  Physical Exam:    VS:  BP 134/68   Pulse 69   Ht '5\' 11"'$  (1.803 m)   Wt 175 lb 9.6 oz (79.7 kg)   SpO2 97%   BMI 24.49 kg/m     Wt Readings from Last 3 Encounters:  09/01/19 175 lb 9.6 oz (79.7 kg)  08/24/19 173 lb 11.2 oz (78.8 kg)  08/10/19 174 lb 12.8 oz (79.3 kg)     GEN:  Well nourished, well developed in no acute distress HEENT: Normal NECK: No JVD; BL carotid bruits LYMPHATICS: No lymphadenopathy CARDIAC: RRR, 3/6 harsh late peaking systolic murmur, no diastolic murmur, A2 audible. RESPIRATORY:  Clear to auscultation without rales, wheezing or rhonchi  ABDOMEN:  Soft, non-tender, non-distended MUSCULOSKELETAL: 1+ edema with chronic stasis changes left lower leg; No deformity  SKIN: Warm and dry NEUROLOGIC:  Alert and oriented x 3 PSYCHIATRIC:  Normal affect   ASSESSMENT:    1. Nonrheumatic aortic valve stenosis   2. Paroxysmal atrial fibrillation (HCC)   3. Essential hypertension    PLAN:    In order of problems listed above:  1. The patient has developed progressive, now severe symptomatic aortic stenosis (stage D1).  This is associated with CCS functional class II symptoms of exertional angina.  I have personally reviewed his echo images which demonstrate severe calcification and restriction of the aortic valve leaflets with a mean transvalvular gradient of 40 mmHg and calculated aortic valve area of 0.75 cm. I have reviewed the natural history of aortic stenosis with the patient today. We have discussed the limitations of medical therapy and the poor prognosis associated with symptomatic aortic stenosis. We have reviewed potential treatment options, including palliative medical therapy, conventional surgical aortic valve replacement, and transcatheter aortic valve replacement. We discussed treatment options in the context of the patient's specific comorbid medical conditions.  Most notably his comorbid conditions include multiple myeloma that seems to be currently well controlled, recent cellulitis and sepsis complicated by thrombocytopenia, and moderate gait instability.  He otherwise is functionally independent and has no other evidence of organ dysfunction.  I think it would be appropriate to proceed with further evaluation to include right and left heart catheterization in order to assess cardiac hemodynamics and the presence of coronary artery disease. I have reviewed the risks, indications, and alternatives to cardiac catheterization, possible angioplasty, and stenting with the patient. Risks include but are not limited to bleeding, infection,  vascular injury, stroke, myocardial infection, arrhythmia, kidney injury, radiation-related injury in the case of prolonged fluoroscopy use, emergency cardiac surgery, and death. The patient understands the risks of serious complication is 1-2 in 6010 with diagnostic cardiac cath and 1-2% or less with angioplasty/stenting.  We will also arrange a gated CTA of the heart and a CTA of the chest, abdomen, and pelvis to evaluate whether the patient has suitable anatomy for TAVR.  Once his cardiac catheterization and CTA studies  are completed, he will be referred for formal surgical consultation as part of a multidisciplinary approach to his care.  I discussed our decision making today with the patient's son-in-law, Dr. [name]. 2. Appears to be maintaining sinus rhythm.  Continue rivaroxaban for oral anticoagulation. 3. Well-controlled on current medical therapy.   Medication Adjustments/Labs and Tests Ordered: Current medicines are reviewed at length with the patient today.  Concerns regarding medicines are outlined above.  Orders Placed This Encounter  Procedures  . CT CORONARY MORPH W/CTA COR W/SCORE W/CA W/CM &/OR WO/CM  . CT ANGIO ABDOMEN PELVIS  W &/OR WO CONTRAST  . CT ANGIO CHEST AORTA W/CM & OR WO/CM  . VAS US CAROTID   No orders of the defined types were placed in this encounter.   Patient Instructions  CATHETERIZATION INSTRUCTIONS: You will be scheduled for a Cardiac Catheterization with Dr. Burt Knack.  1. Once scheduled, please arrive at the [facility] (Main Entrance A) at [facility]: [place], [place] [address] two hours before your procedure to ensure your preparation. Free valet parking service is available. You are allowed ONE visitor in the waiting room during your procedure. Both you and your guest must wear masks. Special note: Every effort is made to have your procedure done on time. Please understand that emergencies sometimes delay scheduled procedures.  2.  Diet: Do not eat solid foods after midnight.  You may have clear liquids until 5am upon the day of the procedure.  3. Medication instructions in preparation for your procedure:  1) HOLD XARELTO 2 days prior to your procedure  2) TAKE ASPIRIN 81 mg the morning of your catheterization  3) You may take your other meds as directed with sips of water  4. Plan for one night stay--bring personal belongings. 5. Bring a current list of your medications and current insurance cards. 6. You MUST have a responsible person to drive you home. 7. Someone MUST be with you the first 24 hours after you arrive home or your discharge will be delayed. 8. Please wear clothes that are easy to get on and off and wear slip-on shoes.  Thank you for allowing [place] to care for you!   -- [facility] Invasive Cardiovascular services    Signed, Sherren Mocha, MD  09/02/2019 3:56 PM    [place]

## 2019-09-01 NOTE — Patient Instructions (Addendum)
CATHETERIZATION INSTRUCTIONS: You will be scheduled for a Cardiac Catheterization with Dr. Burt Knack.  1. Once scheduled, please arrive at the [facility] (Main Entrance A) at [facility]: [place], [place] [address] two hours before your procedure to ensure your preparation. Free valet parking service is available. You are allowed ONE visitor in the waiting room during your procedure. Both you and your guest must wear masks. Special note: Every effort is made to have your procedure done on time. Please understand that emergencies sometimes delay scheduled procedures.  2. Diet: Do not eat solid foods after midnight.  You may have clear liquids until 5am upon the day of the procedure.  3. Medication instructions in preparation for your procedure:  1) HOLD XARELTO 2 days prior to your procedure  2) TAKE ASPIRIN 81 mg the morning of your catheterization  3) You may take your other meds as directed with sips of water  4. Plan for one night stay--bring personal belongings. 5. Bring a current list of your medications and current insurance cards. 6. You MUST have a responsible person to drive you home. 7. Someone MUST be with you the first 24 hours after you arrive home or your discharge will be delayed. 8. Please wear clothes that are easy to get on and off and wear slip-on shoes.  Thank you for allowing [place] to care for you!   -- [place] Invasive Cardiovascular services

## 2019-09-01 NOTE — Progress Notes (Signed)
Cardiology Office Note:    Date:  09/02/2019   ID:  Mitchell Lowery, DOB 12/17/1934, MRN [id number]  PCP:  [name], MD  [facility] Cardiologist:  Sherren Mocha, MD  [place] Electrophysiologist:  None   Referring MD: [name], MD   Chief Complaint  Patient presents with  . Atrial Flutter    History of Present Illness:    Mitchell Lowery is a 85 y.o. male with a hx of  paroxysmal atrial fibrillation and moderate aortic stenosis, presenting for follow-up evaluation.  The patient has been anticoagulated with rivaroxaban.  He has had a relatively low atrial fibrillation burden based on outpatient telemetry monitoring. The patient was hospitalized in June with cellulitis and bacteremia. He did not have any cardiac-related issues during that hospitalization. He did develop thrombocytopenia and required platelet transfusion. He has recovered well and is back to his regular exercise program. He uses a rowing machine several days per week, with peak heart rates in the 120's, usually rowing a total of 5000 meters with a warmup and 6 or 8 intervals. He has no symptoms with that level of exertion. However, with walking up a hill he has developed discomfort and pressure across his chest over the past 6 months. Symptoms resolve over several minutes of rest and typically do not recur with continued walking. He does not feel the symptoms are progressive but has only noticed them for a period of 6 months. No resting chest pain or pressure. No dyspnea, orthopnea, or PND.  He brings in home blood pressure and pulse readings and they are in an excellent range.  Past Medical History:  Diagnosis Date  . Anal fistula    treated in [place]  . Anemia    only due to myeloma- "normal now"  . Heart murmur   . Hemorrhoids   . Multiple myeloma (HCC)    1 month remission now-last tx. 1 month ago- /[name]  . Prostate cancer [facility])    urinary retention, surgery planned-prior radiation, and  seed implant about 1 year ago.  . Prostatitis   . Pulmonary embolus, left ([place]) 03/28/2016  . UTI (lower urinary tract infection)    multiple urinary trract infection- being tx presently with antibiotic at present.    Past Surgical History:  Procedure Laterality Date  . ANAL FISTULECTOMY    . ANKLE SURGERY    . CYSTOSCOPY    . CYSTOSCOPY WITH INSERTION OF UROLIFT N/A 03/12/2015   Procedure: CYSTOSCOPY WITH INSERTION OF UROLIFT;  Surgeon: Franchot Gallo, MD;  Location: [facility];  Service: Urology;  Laterality: N/A;  . PROSTATE BIOPSY    . PROSTATE BIOPSY    . RADIOACTIVE SEED IMPLANT N/A 07/21/2014   Procedure: RADIOACTIVE SEED IMPLANT/BRACHYTHERAPY IMPLANT;  Surgeon: Franchot Gallo, MD;  Location: [facility];  Service: Urology;  Laterality: N/A;  . TONSILLECTOMY      Current Medications: Current Meds  Medication Sig  . Cholecalciferol 2000 units TABS Take 1 tablet by mouth every evening.   . diphenoxylate-atropine (LOMOTIL) 2.5-0.025 MG tablet Take 1 tablet by mouth daily as needed for diarrhea or loose stools. Usually takes 1 tablet a week  . feeding supplement, ENSURE ENLIVE, (ENSURE ENLIVE) LIQD Take 237 mLs by mouth 2 (two) times daily between meals.  . hydrOXYzine (ATARAX/VISTARIL) 25 MG tablet Take 1 tablet (25 mg total) by mouth at bedtime as needed for anxiety (insomnia).  . losartan (COZAAR) 50 MG tablet Take 50 mg by mouth daily.   Marland Kitchen  ondansetron (ZOFRAN) 4 MG tablet Take 1 tablet (4 mg total) by mouth every 6 (six) hours as needed for nausea.  . Psyllium (QC NATURAL VEGETABLE) 95 % POWD Take 1 packet by mouth daily.   . sennosides-docusate sodium (SENOKOT-S) 8.6-50 MG tablet Take 1 tablet by mouth daily as needed for constipation.   . valACYclovir (VALTREX) 500 MG tablet Take 500 mg by mouth daily.   . vitamin B-12 (CYANOCOBALAMIN) 500 MCG tablet Take 500 mcg by mouth daily.  Alveda Reasons 20 MG TABS tablet TAKE 1 TABLET BY MOUTH DAILY WITH SUPPER.      Allergies:   Bee venom   Social History   Socioeconomic History  . Marital status: Married    Spouse name: Not on file  . Number of children: Not on file  . Years of education: Not on file  . Highest education level: Not on file  Occupational History  . Not on file  Tobacco Use  . Smoking status: Never Smoker  . Smokeless tobacco: Never Used  Vaping Use  . Vaping Use: Never used  Substance and Sexual Activity  . Alcohol use: Yes    Comment: 1 glass wine daily  . Drug use: No  . Sexual activity: Yes  Other Topics Concern  . Not on file  Social History Narrative  . Not on file   Social Determinants of Health   Financial Resource Strain:   . Difficulty of Paying Living Expenses:   Food Insecurity:   . Worried About Charity fundraiser in the Last Year:   . Arboriculturist in the Last Year:   Transportation Needs:   . Film/video editor (Medical):   Marland Kitchen Lack of Transportation (Non-Medical):   Physical Activity:   . Days of Exercise per Week:   . Minutes of Exercise per Session:   Stress:   . Feeling of Stress :   Social Connections:   . Frequency of Communication with Friends and Family:   . Frequency of Social Gatherings with Friends and Family:   . Attends Religious Services:   . Active Member of Clubs or Organizations:   . Attends Archivist Meetings:   Marland Kitchen Marital Status:      Family History: The patient's family history includes Cancer in his maternal grandfather.  ROS:   Please see the history of present illness.    Positive for poor balance. No falls in 24 months. Using a walking stick. All other systems reviewed and are negative.  EKGs/Labs/Other Studies Reviewed:    The following studies were reviewed today: Echo: IMPRESSIONS    1. Severe aortic valve stenosis. Severely calcified aortic valve.  Dimensionless index 0.24. Maximum gradient obtained from apical window.  The aortic valve is abnormal. Aortic valve regurgitation is  trivial.  Aortic valve area, by VTI measures 0.75 cm.  Aortic valve mean gradient measures 40.0 mmHg. Aortic valve Vmax measures  4.12 m/s.  2. Left ventricular ejection fraction, by estimation, is 55 to 60%. The  left ventricle has normal function. The left ventricle has no regional  wall motion abnormalities. There is moderate left ventricular hypertrophy.  Left ventricular diastolic  parameters are consistent with Grade I diastolic dysfunction (impaired  relaxation).  3. Right ventricular systolic function is normal. The right ventricular  size is normal. Tricuspid regurgitation signal is inadequate for assessing  PA pressure.  4. The mitral valve is degenerative. Trivial mitral valve regurgitation.  Mild mitral stenosis. The mean mitral valve gradient  is 2.0 mmHg with  average heart rate of 59 bpm.  5. The inferior vena cava is normal in size with greater than 50%  respiratory variability, suggesting right atrial pressure of 3 mmHg.   Comparison(s): A prior study was performed on 08/16/18. Aortic valve  stenosis is now severe.   EKG:  EKG is not ordered today.    Recent Labs: 02/28/2019: Magnesium 1.7 08/24/2019: ALT 11; BUN 7; Creatinine 0.71; Hemoglobin 13.6; Platelet Count 96; Potassium 3.7; Sodium 143  Recent Lipid Panel No results found for: CHOL, TRIG, HDL, CHOLHDL, VLDL, LDLCALC, LDLDIRECT  Physical Exam:    VS:  BP 134/68   Pulse 69   Ht '5\' 11"'$  (1.803 m)   Wt 175 lb 9.6 oz (79.7 kg)   SpO2 97%   BMI 24.49 kg/m     Wt Readings from Last 3 Encounters:  09/01/19 175 lb 9.6 oz (79.7 kg)  08/24/19 173 lb 11.2 oz (78.8 kg)  08/10/19 174 lb 12.8 oz (79.3 kg)     GEN:  Well nourished, well developed in no acute distress HEENT: Normal NECK: No JVD; BL carotid bruits LYMPHATICS: No lymphadenopathy CARDIAC: RRR, 3/6 harsh late peaking systolic murmur, no diastolic murmur, A2 audible. RESPIRATORY:  Clear to auscultation without rales, wheezing or rhonchi  ABDOMEN:  Soft, non-tender, non-distended MUSCULOSKELETAL: 1+ edema with chronic stasis changes left lower leg; No deformity  SKIN: Warm and dry NEUROLOGIC:  Alert and oriented x 3 PSYCHIATRIC:  Normal affect   ASSESSMENT:    1. Nonrheumatic aortic valve stenosis   2. Paroxysmal atrial fibrillation (HCC)   3. Essential hypertension    PLAN:    In order of problems listed above:  1. The patient has developed progressive, now severe symptomatic aortic stenosis (stage D1).  This is associated with CCS functional class II symptoms of exertional angina.  I have personally reviewed his echo images which demonstrate severe calcification and restriction of the aortic valve leaflets with a mean transvalvular gradient of 40 mmHg and calculated aortic valve area of 0.75 cm. I have reviewed the natural history of aortic stenosis with the patient today. We have discussed the limitations of medical therapy and the poor prognosis associated with symptomatic aortic stenosis. We have reviewed potential treatment options, including palliative medical therapy, conventional surgical aortic valve replacement, and transcatheter aortic valve replacement. We discussed treatment options in the context of the patient's specific comorbid medical conditions.  Most notably his comorbid conditions include multiple myeloma that seems to be currently well controlled, recent cellulitis and sepsis complicated by thrombocytopenia, and moderate gait instability.  He otherwise is functionally independent and has no other evidence of organ dysfunction.  I think it would be appropriate to proceed with further evaluation to include right and left heart catheterization in order to assess cardiac hemodynamics and the presence of coronary artery disease. I have reviewed the risks, indications, and alternatives to cardiac catheterization, possible angioplasty, and stenting with the patient. Risks include but are not limited to bleeding, infection,  vascular injury, stroke, myocardial infection, arrhythmia, kidney injury, radiation-related injury in the case of prolonged fluoroscopy use, emergency cardiac surgery, and death. The patient understands the risks of serious complication is 1-2 in 2585 with diagnostic cardiac cath and 1-2% or less with angioplasty/stenting.  We will also arrange a gated CTA of the heart and a CTA of the chest, abdomen, and pelvis to evaluate whether the patient has suitable anatomy for TAVR.  Once his cardiac catheterization and CTA studies  are completed, he will be referred for formal surgical consultation as part of a multidisciplinary approach to his care.  I discussed our decision making today with the patient's son-in-law, Dr. [name]. 2. Appears to be maintaining sinus rhythm.  Continue rivaroxaban for oral anticoagulation. 3. Well-controlled on current medical therapy.   Medication Adjustments/Labs and Tests Ordered: Current medicines are reviewed at length with the patient today.  Concerns regarding medicines are outlined above.  Orders Placed This Encounter  Procedures  . CT CORONARY MORPH W/CTA COR W/SCORE W/CA W/CM &/OR WO/CM  . CT ANGIO ABDOMEN PELVIS  W &/OR WO CONTRAST  . CT ANGIO CHEST AORTA W/CM & OR WO/CM  . VAS US CAROTID   No orders of the defined types were placed in this encounter.   Patient Instructions  CATHETERIZATION INSTRUCTIONS: You will be scheduled for a Cardiac Catheterization with Dr. Burt Knack.  1. Once scheduled, please arrive at the [facility] (Main Entrance A) at [facility]: [place], [place] [address] two hours before your procedure to ensure your preparation. Free valet parking service is available. You are allowed ONE visitor in the waiting room during your procedure. Both you and your guest must wear masks. Special note: Every effort is made to have your procedure done on time. Please understand that emergencies sometimes delay scheduled procedures.  2.  Diet: Do not eat solid foods after midnight.  You may have clear liquids until 5am upon the day of the procedure.  3. Medication instructions in preparation for your procedure:  1) HOLD XARELTO 2 days prior to your procedure  2) TAKE ASPIRIN 81 mg the morning of your catheterization  3) You may take your other meds as directed with sips of water  4. Plan for one night stay--bring personal belongings. 5. Bring a current list of your medications and current insurance cards. 6. You MUST have a responsible person to drive you home. 7. Someone MUST be with you the first 24 hours after you arrive home or your discharge will be delayed. 8. Please wear clothes that are easy to get on and off and wear slip-on shoes.  Thank you for allowing [place] to care for you!   -- [facility] Invasive Cardiovascular services    Signed, Sherren Mocha, MD  09/02/2019 3:56 PM    [place]

## 2019-09-02 LAB — ECHOCARDIOGRAM COMPLETE
AR max vel: 0.72 cm2
AV Area VTI: 0.75 cm2
AV Area mean vel: 0.7 cm2
AV Mean grad: 40 mmHg
AV Peak grad: 67.9 mmHg
Ao pk vel: 4.12 m/s
Area-P 1/2: 1.66 cm2
S' Lateral: 2.3 cm

## 2019-09-02 NOTE — Telephone Encounter (Signed)
Rescheduled cath to 9/3. Will arrange CT scans for week of 8/16 and call the patient next week with new dates/times/instructions.

## 2019-09-03 ENCOUNTER — Other Ambulatory Visit (HOSPITAL_COMMUNITY): Payer: Medicare PPO

## 2019-09-05 DIAGNOSIS — M7061 Trochanteric bursitis, right hip: Secondary | ICD-10-CM | POA: Diagnosis not present

## 2019-09-05 DIAGNOSIS — R531 Weakness: Secondary | ICD-10-CM | POA: Diagnosis not present

## 2019-09-07 ENCOUNTER — Inpatient Hospital Stay: Payer: Medicare PPO | Attending: Oncology

## 2019-09-07 ENCOUNTER — Inpatient Hospital Stay (HOSPITAL_BASED_OUTPATIENT_CLINIC_OR_DEPARTMENT_OTHER): Payer: Medicare PPO | Admitting: Oncology

## 2019-09-07 ENCOUNTER — Other Ambulatory Visit: Payer: Self-pay

## 2019-09-07 ENCOUNTER — Inpatient Hospital Stay: Payer: Medicare PPO

## 2019-09-07 VITALS — BP 113/66 | HR 70 | Temp 95.1°F | Resp 17 | Ht 71.0 in | Wt 175.4 lb

## 2019-09-07 DIAGNOSIS — Z923 Personal history of irradiation: Secondary | ICD-10-CM | POA: Diagnosis not present

## 2019-09-07 DIAGNOSIS — C9000 Multiple myeloma not having achieved remission: Secondary | ICD-10-CM

## 2019-09-07 DIAGNOSIS — Z5111 Encounter for antineoplastic chemotherapy: Secondary | ICD-10-CM | POA: Diagnosis not present

## 2019-09-07 DIAGNOSIS — L03116 Cellulitis of left lower limb: Secondary | ICD-10-CM | POA: Diagnosis not present

## 2019-09-07 DIAGNOSIS — Z79899 Other long term (current) drug therapy: Secondary | ICD-10-CM | POA: Insufficient documentation

## 2019-09-07 DIAGNOSIS — I350 Nonrheumatic aortic (valve) stenosis: Secondary | ICD-10-CM | POA: Diagnosis not present

## 2019-09-07 LAB — CBC WITH DIFFERENTIAL (CANCER CENTER ONLY)
Abs Immature Granulocytes: 0 K/uL (ref 0.00–0.07)
Basophils Absolute: 0 K/uL (ref 0.0–0.1)
Basophils Relative: 1 %
Eosinophils Absolute: 0.1 K/uL (ref 0.0–0.5)
Eosinophils Relative: 2 %
HCT: 39.3 % (ref 39.0–52.0)
Hemoglobin: 13.2 g/dL (ref 13.0–17.0)
Immature Granulocytes: 0 %
Lymphocytes Relative: 18 %
Lymphs Abs: 0.5 K/uL — ABNORMAL LOW (ref 0.7–4.0)
MCH: 32.3 pg (ref 26.0–34.0)
MCHC: 33.6 g/dL (ref 30.0–36.0)
MCV: 96.1 fL (ref 80.0–100.0)
Monocytes Absolute: 0.4 K/uL (ref 0.1–1.0)
Monocytes Relative: 13 %
Neutro Abs: 1.9 K/uL (ref 1.7–7.7)
Neutrophils Relative %: 66 %
Platelet Count: 90 K/uL — ABNORMAL LOW (ref 150–400)
RBC: 4.09 MIL/uL — ABNORMAL LOW (ref 4.22–5.81)
RDW: 12.8 % (ref 11.5–15.5)
WBC Count: 2.8 K/uL — ABNORMAL LOW (ref 4.0–10.5)
nRBC: 0 % (ref 0.0–0.2)

## 2019-09-07 LAB — CMP (CANCER CENTER ONLY)
ALT: 10 U/L (ref 0–44)
AST: 14 U/L — ABNORMAL LOW (ref 15–41)
Albumin: 3.3 g/dL — ABNORMAL LOW (ref 3.5–5.0)
Alkaline Phosphatase: 37 U/L — ABNORMAL LOW (ref 38–126)
Anion gap: 7 (ref 5–15)
BUN: 7 mg/dL — ABNORMAL LOW (ref 8–23)
CO2: 24 mmol/L (ref 22–32)
Calcium: 9.2 mg/dL (ref 8.9–10.3)
Chloride: 110 mmol/L (ref 98–111)
Creatinine: 0.69 mg/dL (ref 0.61–1.24)
GFR, Est AFR Am: >60 mL/min
GFR, Estimated: >60 mL/min
Glucose, Bld: 101 mg/dL — ABNORMAL HIGH (ref 70–99)
Potassium: 3.7 mmol/L (ref 3.5–5.1)
Sodium: 141 mmol/L (ref 135–145)
Total Bilirubin: 0.8 mg/dL (ref 0.3–1.2)
Total Protein: 5.6 g/dL — ABNORMAL LOW (ref 6.5–8.1)

## 2019-09-07 MED ORDER — DEXAMETHASONE 4 MG PO TABS
ORAL_TABLET | ORAL | Status: AC
  Filled 2019-09-07: qty 5

## 2019-09-07 MED ORDER — DIPHENHYDRAMINE HCL 25 MG PO CAPS
50.0000 mg | ORAL_CAPSULE | Freq: Once | ORAL | Status: AC
  Administered 2019-09-07: 50 mg via ORAL

## 2019-09-07 MED ORDER — ACETAMINOPHEN 325 MG PO TABS
ORAL_TABLET | ORAL | Status: AC
  Filled 2019-09-07: qty 2

## 2019-09-07 MED ORDER — DARATUMUMAB-HYALURONIDASE-FIHJ 1800-30000 MG-UT/15ML ~~LOC~~ SOLN
1800.0000 mg | Freq: Once | SUBCUTANEOUS | Status: AC
  Administered 2019-09-07: 1800 mg via SUBCUTANEOUS
  Filled 2019-09-07: qty 15

## 2019-09-07 MED ORDER — MONTELUKAST SODIUM 10 MG PO TABS
10.0000 mg | ORAL_TABLET | Freq: Once | ORAL | Status: AC
  Administered 2019-09-07: 10 mg via ORAL

## 2019-09-07 MED ORDER — SODIUM CHLORIDE 0.9 % IV SOLN
INTRAVENOUS | Status: DC
  Filled 2019-09-07: qty 250

## 2019-09-07 MED ORDER — DEXTROSE 5 % IV SOLN
56.0000 mg/m2 | Freq: Once | INTRAVENOUS | Status: AC
  Administered 2019-09-07: 120 mg via INTRAVENOUS
  Filled 2019-09-07: qty 60

## 2019-09-07 MED ORDER — DIPHENHYDRAMINE HCL 25 MG PO CAPS
ORAL_CAPSULE | ORAL | Status: AC
  Filled 2019-09-07: qty 2

## 2019-09-07 MED ORDER — DEXAMETHASONE 4 MG PO TABS
20.0000 mg | ORAL_TABLET | Freq: Once | ORAL | Status: AC
  Administered 2019-09-07: 20 mg via ORAL

## 2019-09-07 MED ORDER — MONTELUKAST SODIUM 10 MG PO TABS
ORAL_TABLET | ORAL | Status: AC
  Filled 2019-09-07: qty 1

## 2019-09-07 MED ORDER — ACETAMINOPHEN 325 MG PO TABS
650.0000 mg | ORAL_TABLET | Freq: Once | ORAL | Status: AC
  Administered 2019-09-07: 650 mg via ORAL

## 2019-09-07 NOTE — Patient Instructions (Signed)
[facility] Discharge Instructions for Patients Receiving Chemotherapy  Today you received the following chemotherapy agents: Kyprolis/Darzalex Faspro.  To help prevent nausea and vomiting after your treatment, we encourage you to take your nausea medication as directed.   If you develop nausea and vomiting that is not controlled by your nausea medication, call the clinic.   BELOW ARE SYMPTOMS THAT SHOULD BE REPORTED IMMEDIATELY:  *FEVER GREATER THAN 100.5 F  *CHILLS WITH OR WITHOUT FEVER  NAUSEA AND VOMITING THAT IS NOT CONTROLLED WITH YOUR NAUSEA MEDICATION  *UNUSUAL SHORTNESS OF BREATH  *UNUSUAL BRUISING OR BLEEDING  TENDERNESS IN MOUTH AND THROAT WITH OR WITHOUT PRESENCE OF ULCERS  *URINARY PROBLEMS  *BOWEL PROBLEMS  UNUSUAL RASH Items with * indicate a potential emergency and should be followed up as soon as possible.  Feel free to call the clinic should you have any questions or concerns. The clinic phone number is [phone].  Please show the CHEMO ALERT CARD at check-in to the Emergency Department and triage nurse.   

## 2019-09-07 NOTE — Progress Notes (Signed)
[place] OFFICE PROGRESS NOTE   Diagnosis: Multiple myeloma  INTERVAL HISTORY:   Mitchell Lowery returns for a scheduled visit.  He was last treated with carfilzomib and daratumumab on 08/24/2019.  He has persistent exertional dyspnea.  He saw Dr. Burt Knack and has been diagnosed with severe aortic stenosis.  He is undergoing an evaluation for a TAVR procedure.  He has intermittent discomfort in the right upper arm.  The pain is present with certain movements.  He cannot relate this to physical activities, but he has been using a rowing machine.  The wound at the left lower leg remains open.  No fever.  He continues anticoagulation therapy.  No bleeding.  No pain at the right chest wall or back.  No difficulty with urination.  Objective:  Vital signs in last 24 hours:  Blood pressure 113/66, pulse 70, temperature (!) 95.1 F (35.1 C), temperature source Axillary, resp. rate 17, height '5\' 11"'  (1.803 m), weight 175 lb 6.4 oz (79.6 kg), SpO2 97 %.    Resp: Lungs clear bilaterally Cardio: Regular rhythm, 2/6 systolic murmur Vascular: 1+ edema at the lower leg bilaterally Musculoskeletal: No tenderness or mass at the right upper arm, pain with Skin: Superficial ulceration at the left lower leg.  Hyperpigmentation of the left lower leg, no erythema    Lab Results:  Lab Results  Component Value Date   WBC 2.8 (L) 09/07/2019   HGB 13.2 09/07/2019   HCT 39.3 09/07/2019   MCV 96.1 09/07/2019   PLT 90 (L) 09/07/2019   NEUTROABS 1.9 09/07/2019    CMP  Lab Results  Component Value Date   NA 141 09/07/2019   K 3.7 09/07/2019   CL 110 09/07/2019   CO2 24 09/07/2019   GLUCOSE 101 (H) 09/07/2019   BUN 7 (L) 09/07/2019   CREATININE 0.69 09/07/2019   CALCIUM 9.2 09/07/2019   PROT 5.6 (L) 09/07/2019   ALBUMIN 3.3 (L) 09/07/2019   AST 14 (L) 09/07/2019   ALT 10 09/07/2019   ALKPHOS 37 (L) 09/07/2019   BILITOT 0.8 09/07/2019   GFRNONAA >60 09/07/2019   GFRAA >60  09/07/2019    Medications: I have reviewed the patient's current medications.   Assessment/Plan: 1. Multiple myeloma-confirmed on a bone marrow biopsy 08/07/2014, IgA lambda  Serum M spike and increased serum free lambda light chains  Myeloma FISH panel negative for chromosome 4, 11, 12, 13, 14, and 17 abnormalities, cytogenetics with no metaphases  Bone survey 08/15/2014 with indeterminant lucent skull lesions  Cycle 1 RVD 08/22/2014  Cycle 2 RVD 09/19/2014  Cycle 3 RVD 10/17/2014  Cycle 4 RVD 11/14/2014 (Decadron reduced to 20 mg weekly, Revlimid 15 mg days 1 through 14, Velcade 3/4 weeks)  Cycle 5 RVD 12/12/2014  Serum M spike not detected 12/26/2014  Cycle 6 RVD 01/09/2015  Maintenance Revlimid, 10 mg daily, 02/05/2015 , discontinue 02/26/2015 secondary to leg edema and diarrhea  Revlimid resumed at a dose of 10 mg every other day beginning 03/13/2015  PET scan 11/73/5670-LI hypermetabolic bone lesions, few lucent lesions in the spine and pelvis  Revlimid discontinued January 2019  Enrollment on a clinical trial at [facility] with pomalidomide/Decadron+/- ixazomibbeginning 03/10/2017  Treatment discontinued February 2021 secondary to rising serum lambda light chains  PET 04/05/2019-hypermetabolic right axillary/chest wall nodes, right paravertebral mass, small right pleural effusion, retrocrural node, right lateral abdominal wall mass, L5 lesion mild compression deformity. Focus of hypermetabolism at the anterior left pelvic wall without a CT correlate. Hypermetabolic lytic  lesion in the left T3 transverse process  Cycle 1 daratumumab/carfilzomib/Decadron 04/05/2019  Radiation to the right chest wall mass and lumbar spine beginning 04/25/2019  Cycle 2 daratumumab/carfilzomib/Decadron 05/04/2019 (carfilzomib dose escalated); day 8 held due to neutropenia, thrombocytopenia; day 15 05/18/2019 (carfilzomib dose reduced)  Daratumumab/carfilzomib/Decadron changed to every 2-week  dosing beginning 05/25/2019  2. 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 History of intermittent prostatitis  4. Skin rash 10/24/2014-referred to dermatology, biopsy consistent with local hypersensitivity reaction  5. 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 Left lower leg cellulitis 06/24/2015-treated with Keflex  7. History of mild thrombocytopenia secondary tomyeloma and systemic therapy  8. History of mild neutropenia-secondary to myeloma and systemic therapy  9. Fall with a skull fracture and subarachnoid blood/right frontal lobe contusions 10/17/2015  10. Right lower lobe pulmonary embolus 03/28/2016. Lovenox , transitioned to Xarelto  11. Vesicular rashJune 2018-potentially related to the zoster vaccine, resolved  12. History of atrial fibrillation  13. Aortic stenosis  14. 07/02/2019-hospital admission for sepsis secondary to cellulitis of the left lower extremity with gram-negative rod bacteremia(Aeromonascaviae)     Disposition: Mitchell Lowery continues every 2-week daratumumab/Decadron/carfilzomib.  He appears to be tolerating the treatment well.  He is in clinical remission from myeloma.  He will complete another treatment today.  He will return for an office visit in the next cycle of therapy in 2 weeks.  We will consult with Dr. Amalia Hailey regarding a change of the daratumumab to monthly dosing after the next treatment.  He is undergoing cardiology evaluation for a TAVR procedure for management of severe aortic stenosis.  He will see Dr. Tommy Medal next week to follow-up on the left lower extremity cellulitis and open wound.  He will call for erythema of the left lower leg or a  fever.  Mitchell Lowery will be due for Zometa in September.      [name], MD  09/07/2019  10:16 AM

## 2019-09-07 NOTE — Progress Notes (Signed)
Ok to treat with platelet count of 90 per Dr Benay Spice.

## 2019-09-08 DIAGNOSIS — M7061 Trochanteric bursitis, right hip: Secondary | ICD-10-CM | POA: Diagnosis not present

## 2019-09-08 DIAGNOSIS — R531 Weakness: Secondary | ICD-10-CM | POA: Diagnosis not present

## 2019-09-08 LAB — KAPPA/LAMBDA LIGHT CHAINS
Kappa free light chain: 1.6 mg/L — ABNORMAL LOW (ref 3.3–19.4)
Kappa, lambda light chain ratio: 0.43 (ref 0.26–1.65)
Lambda free light chains: 3.7 mg/L — ABNORMAL LOW (ref 5.7–26.3)

## 2019-09-08 LAB — PROTEIN ELECTROPHORESIS, SERUM
A/G Ratio: 1.7 (ref 0.7–1.7)
Albumin ELP: 3.3 g/dL (ref 2.9–4.4)
Alpha-1-Globulin: 0.2 g/dL (ref 0.0–0.4)
Alpha-2-Globulin: 0.6 g/dL (ref 0.4–1.0)
Beta Globulin: 0.8 g/dL (ref 0.7–1.3)
Gamma Globulin: 0.3 g/dL — ABNORMAL LOW (ref 0.4–1.8)
Globulin, Total: 1.9 g/dL — ABNORMAL LOW (ref 2.2–3.9)
M-Spike, %: 0.1 g/dL — ABNORMAL HIGH
Total Protein ELP: 5.2 g/dL — ABNORMAL LOW (ref 6.0–8.5)

## 2019-09-08 LAB — IGA: IgA: 7 mg/dL — ABNORMAL LOW (ref 61–437)

## 2019-09-12 ENCOUNTER — Ambulatory Visit (HOSPITAL_COMMUNITY): Payer: Medicare PPO

## 2019-09-12 ENCOUNTER — Other Ambulatory Visit: Payer: Self-pay

## 2019-09-12 ENCOUNTER — Encounter (HOSPITAL_COMMUNITY): Payer: Medicare PPO

## 2019-09-12 ENCOUNTER — Encounter: Payer: Self-pay | Admitting: Infectious Disease

## 2019-09-12 ENCOUNTER — Ambulatory Visit (INDEPENDENT_AMBULATORY_CARE_PROVIDER_SITE_OTHER): Payer: Medicare PPO | Admitting: Infectious Disease

## 2019-09-12 VITALS — BP 160/79 | HR 66 | Temp 97.7°F | Wt 175.0 lb

## 2019-09-12 DIAGNOSIS — L03116 Cellulitis of left lower limb: Secondary | ICD-10-CM

## 2019-09-12 DIAGNOSIS — R7881 Bacteremia: Secondary | ICD-10-CM

## 2019-09-12 DIAGNOSIS — C9001 Multiple myeloma in remission: Secondary | ICD-10-CM | POA: Diagnosis not present

## 2019-09-12 DIAGNOSIS — D801 Nonfamilial hypogammaglobulinemia: Secondary | ICD-10-CM

## 2019-09-12 NOTE — Progress Notes (Signed)
Subjective:  Implant follow-up for cellulitis and bacteremia with Aeromonas  Patient ID: Mitchell Lowery, male    DOB: [birth year], 85 y.o.   MRN: [id number]   HPI  85 y.o. male with prostate cancer, multiple myeloma,on chemotherapy  Admitted with severe cellulitis with aeromonas caviae bacteremia and sepsis.  We narrowed him to ceftriaxone in the hospital he cleared his blood cultures.  We are able to de-escalate him to Keflex and he completed a total of 10 days of antibiotics.  He has restarted chemotherapy for his multiple myeloma.  Cellulitis seems largely resolved.  Since he has had cellulitis once before when he came back from [place] and undoubtedly has damage to his lymphatics and is on chemotherapy I would  want him to have a bottle of Keflex at home to initiate the first signs of cellulitis should it recur, again not necessarily with this organism but with may be potentially more typical offenders such as strep  He presents for followup. He has had no further episodes of cellulitis.  His chronic wound is opening up more so when he wears long pants and continues to apply antibiotic ointment to this area.  Past Medical History:  Diagnosis Date  . Anal fistula    treated in [place]  . Anemia    only due to myeloma- "normal now"  . Heart murmur   . Hemorrhoids   . Multiple myeloma (HCC)    1 month remission now-last tx. 1 month ago- /[name]  . Prostate cancer [facility])    urinary retention, surgery planned-prior radiation, and seed implant about 1 year ago.  . Prostatitis   . Pulmonary embolus, left ([place]) 03/28/2016  . UTI (lower urinary tract infection)    multiple urinary trract infection- being tx presently with antibiotic at present.    Past Surgical History:  Procedure Laterality Date  . ANAL FISTULECTOMY    . ANKLE SURGERY    . CYSTOSCOPY    . CYSTOSCOPY WITH INSERTION OF UROLIFT N/A 03/12/2015   Procedure: CYSTOSCOPY WITH INSERTION OF UROLIFT;  Surgeon:  Franchot Gallo, MD;  Location: [facility];  Service: Urology;  Laterality: N/A;  . PROSTATE BIOPSY    . PROSTATE BIOPSY    . RADIOACTIVE SEED IMPLANT N/A 07/21/2014   Procedure: RADIOACTIVE SEED IMPLANT/BRACHYTHERAPY IMPLANT;  Surgeon: Franchot Gallo, MD;  Location: [facility];  Service: Urology;  Laterality: N/A;  . TONSILLECTOMY      Family History  Problem Relation Age of Onset  . Cancer Maternal Grandfather        mouth/throat ca      Social History   Socioeconomic History  . Marital status: Married    Spouse name: Not on file  . Number of children: Not on file  . Years of education: Not on file  . Highest education level: Not on file  Occupational History  . Not on file  Tobacco Use  . Smoking status: Never Smoker  . Smokeless tobacco: Never Used  Vaping Use  . Vaping Use: Never used  Substance and Sexual Activity  . Alcohol use: Yes    Comment: 1 glass wine daily  . Drug use: No  . Sexual activity: Yes  Other Topics Concern  . Not on file  Social History Narrative  . Not on file   Social Determinants of Health   Financial Resource Strain:   . Difficulty of Paying Living Expenses:   Food Insecurity:   . Worried About Charity fundraiser  in the Last Year:   . [place] in the Last Year:   Transportation Needs:   . Film/video editor (Medical):   Marland Kitchen Lack of Transportation (Non-Medical):   Physical Activity:   . Days of Exercise per Week:   . Minutes of Exercise per Session:   Stress:   . Feeling of Stress :   Social Connections:   . Frequency of Communication with Friends and Family:   . Frequency of Social Gatherings with Friends and Family:   . Attends Religious Services:   . Active Member of Clubs or Organizations:   . Attends Archivist Meetings:   Marland Kitchen Marital Status:     Allergies  Allergen Reactions  . Bee Venom Other (See Comments)    Fatigue and flushing     Current Outpatient Medications:  .   Cholecalciferol 2000 units TABS, Take 1 tablet by mouth every evening. , Disp: , Rfl:  .  diphenoxylate-atropine (LOMOTIL) 2.5-0.025 MG tablet, Take 1 tablet by mouth daily as needed for diarrhea or loose stools. Usually takes 1 tablet a week, Disp: , Rfl:  .  feeding supplement, ENSURE ENLIVE, (ENSURE ENLIVE) LIQD, Take 237 mLs by mouth 2 (two) times daily between meals., Disp: 237 mL, Rfl: 12 .  hydrOXYzine (ATARAX/VISTARIL) 25 MG tablet, Take 1 tablet (25 mg total) by mouth at bedtime as needed for anxiety (insomnia)., Disp: 15 tablet, Rfl: 0 .  losartan (COZAAR) 50 MG tablet, Take 50 mg by mouth daily. , Disp: , Rfl:  .  ondansetron (ZOFRAN) 4 MG tablet, Take 1 tablet (4 mg total) by mouth every 6 (six) hours as needed for nausea., Disp: 20 tablet, Rfl: 0 .  Psyllium (QC NATURAL VEGETABLE) 95 % POWD, Take 1 packet by mouth daily. , Disp: , Rfl:  .  sennosides-docusate sodium (SENOKOT-S) 8.6-50 MG tablet, Take 1 tablet by mouth daily as needed for constipation. , Disp: , Rfl:  .  valACYclovir (VALTREX) 500 MG tablet, Take 500 mg by mouth daily. , Disp: , Rfl:  .  vitamin B-12 (CYANOCOBALAMIN) 500 MCG tablet, Take 500 mcg by mouth daily., Disp: , Rfl:  .  XARELTO 20 MG TABS tablet, TAKE 1 TABLET BY MOUTH DAILY WITH SUPPER., Disp: 30 tablet, Rfl: 11   Review of Systems  Constitutional: Negative for activity change, appetite change, chills, diaphoresis, fatigue, fever and unexpected weight change.  HENT: Negative for congestion, rhinorrhea, sinus pressure, sneezing, sore throat and trouble swallowing.   Eyes: Negative for photophobia and visual disturbance.  Respiratory: Positive for shortness of breath. Negative for cough, chest tightness, wheezing and stridor.   Cardiovascular: Negative for chest pain, palpitations and leg swelling.  Gastrointestinal: Negative for abdominal distention, abdominal pain, anal bleeding, blood in stool, constipation, diarrhea, nausea and vomiting.  Genitourinary:  Negative for difficulty urinating, dysuria, flank pain and hematuria.  Musculoskeletal: Negative for arthralgias, back pain, gait problem, joint swelling and myalgias.  Skin: Negative for color change, pallor, rash and wound.  Neurological: Negative for dizziness, tremors, weakness and light-headedness.  Hematological: Negative for adenopathy. Does not bruise/bleed easily.  Psychiatric/Behavioral: Negative for agitation, behavioral problems, confusion, decreased concentration, dysphoric mood and sleep disturbance.       Objective:   Physical Exam Constitutional:      Appearance: He is well-developed.  HENT:     Head: Normocephalic and atraumatic.  Eyes:     Conjunctiva/sclera: Conjunctivae normal.  Cardiovascular:     Rate and Rhythm: Normal rate and regular  rhythm.  Pulmonary:     Effort: Pulmonary effort is normal. No respiratory distress.     Breath sounds: No wheezing.  Abdominal:     General: There is no distension.     Palpations: Abdomen is soft.  Musculoskeletal:        General: No tenderness. Normal range of motion.     Cervical back: Normal range of motion and neck supple.  Skin:    General: Skin is warm and dry.     Coloration: Skin is not pale.     Findings: No erythema or rash.  Neurological:     General: No focal deficit present.     Mental Status: He is alert and oriented to person, place, and time.  Psychiatric:        Mood and Affect: Mood normal.        Behavior: Behavior normal.        Thought Content: Thought content normal.        Judgment: Judgment normal.    Leg from hospital:  07/02/2019:     07/02/2019:     07/18/2019:       09/12/2019 legs:            Assessment & Plan:  Cellulitis and bacteremia with Aeromonas caviae:  Completed treatment  Because of damage to lymphatics with cellulitis he will be at recurrence for cellultiis (not likely with aeromonas but more likely strep. He is to keep bottle of keflex with him at all  times in case cellulitis recurs and to call me.  Chronic wound on left ankle at site of prior trauma. I would prefer this area scab over rather than be open so if applying abx ointment is interfering then he can stop this. I do not see compelling reason to have it removed at this point  Aortic stenosis: considering TAVR  Multiple myeloma: following with [name]   TAVR

## 2019-09-13 ENCOUNTER — Ambulatory Visit (HOSPITAL_COMMUNITY)
Admission: RE | Admit: 2019-09-13 | Discharge: 2019-09-13 | Disposition: A | Discharge status: Home / Self Care | Payer: Medicare PPO | Source: Ambulatory Visit | Attending: Cardiovascular Disease | Admitting: Cardiovascular Disease

## 2019-09-13 DIAGNOSIS — I700 Atherosclerosis of aorta: Secondary | ICD-10-CM | POA: Diagnosis not present

## 2019-09-13 DIAGNOSIS — Z01818 Encounter for other preprocedural examination: Secondary | ICD-10-CM | POA: Diagnosis not present

## 2019-09-13 DIAGNOSIS — I2699 Other pulmonary embolism without acute cor pulmonale: Secondary | ICD-10-CM | POA: Diagnosis not present

## 2019-09-13 DIAGNOSIS — D801 Nonfamilial hypogammaglobulinemia: Secondary | ICD-10-CM | POA: Diagnosis not present

## 2019-09-13 DIAGNOSIS — I350 Nonrheumatic aortic (valve) stenosis: Secondary | ICD-10-CM | POA: Insufficient documentation

## 2019-09-13 DIAGNOSIS — C9002 Multiple myeloma in relapse: Secondary | ICD-10-CM | POA: Diagnosis not present

## 2019-09-13 MED ORDER — IOHEXOL 350 MG/ML SOLN
100.0000 mL | Freq: Once | INTRAVENOUS | Status: AC | PRN
  Administered 2019-09-13: 100 mL via INTRAVENOUS

## 2019-09-14 ENCOUNTER — Telehealth: Payer: Self-pay

## 2019-09-14 DIAGNOSIS — R531 Weakness: Secondary | ICD-10-CM | POA: Diagnosis not present

## 2019-09-14 DIAGNOSIS — M7061 Trochanteric bursitis, right hip: Secondary | ICD-10-CM | POA: Diagnosis not present

## 2019-09-14 NOTE — Telephone Encounter (Signed)
Called to touch base with the patient after CT scans yesterday and to give new cath information. His wife states he is tired and she doesn't want to bother him right now. She requests a call back later this week. Will send new instructions via MyChart in the meantime.

## 2019-09-15 DIAGNOSIS — I10 Essential (primary) hypertension: Secondary | ICD-10-CM | POA: Diagnosis not present

## 2019-09-15 DIAGNOSIS — D61818 Other pancytopenia: Secondary | ICD-10-CM | POA: Diagnosis not present

## 2019-09-15 DIAGNOSIS — I4891 Unspecified atrial fibrillation: Secondary | ICD-10-CM | POA: Diagnosis not present

## 2019-09-15 DIAGNOSIS — N400 Enlarged prostate without lower urinary tract symptoms: Secondary | ICD-10-CM | POA: Diagnosis not present

## 2019-09-15 DIAGNOSIS — D649 Anemia, unspecified: Secondary | ICD-10-CM | POA: Diagnosis not present

## 2019-09-15 DIAGNOSIS — C61 Malignant neoplasm of prostate: Secondary | ICD-10-CM | POA: Diagnosis not present

## 2019-09-16 NOTE — Telephone Encounter (Signed)
Attempted to call patient - mailbox is full and unable to leave message.  Will send MyChart message asking patient to respond if he has any questions about cath.

## 2019-09-17 ENCOUNTER — Other Ambulatory Visit: Payer: Self-pay | Admitting: Oncology

## 2019-09-21 ENCOUNTER — Other Ambulatory Visit: Payer: Self-pay

## 2019-09-21 ENCOUNTER — Inpatient Hospital Stay (HOSPITAL_BASED_OUTPATIENT_CLINIC_OR_DEPARTMENT_OTHER): Payer: Medicare PPO | Admitting: Oncology

## 2019-09-21 ENCOUNTER — Inpatient Hospital Stay: Payer: Medicare PPO

## 2019-09-21 ENCOUNTER — Ambulatory Visit: Payer: Medicare PPO

## 2019-09-21 VITALS — BP 128/65 | HR 61 | Temp 97.6°F | Resp 17 | Ht 71.0 in | Wt 175.0 lb

## 2019-09-21 DIAGNOSIS — C9000 Multiple myeloma not having achieved remission: Secondary | ICD-10-CM

## 2019-09-21 DIAGNOSIS — I350 Nonrheumatic aortic (valve) stenosis: Secondary | ICD-10-CM | POA: Diagnosis not present

## 2019-09-21 DIAGNOSIS — Z923 Personal history of irradiation: Secondary | ICD-10-CM | POA: Diagnosis not present

## 2019-09-21 DIAGNOSIS — L03116 Cellulitis of left lower limb: Secondary | ICD-10-CM | POA: Diagnosis not present

## 2019-09-21 DIAGNOSIS — Z23 Encounter for immunization: Secondary | ICD-10-CM

## 2019-09-21 DIAGNOSIS — Z5111 Encounter for antineoplastic chemotherapy: Secondary | ICD-10-CM | POA: Diagnosis not present

## 2019-09-21 DIAGNOSIS — Z79899 Other long term (current) drug therapy: Secondary | ICD-10-CM | POA: Diagnosis not present

## 2019-09-21 LAB — CBC WITH DIFFERENTIAL (CANCER CENTER ONLY)
Abs Immature Granulocytes: 0.01 10*3/uL (ref 0.00–0.07)
Basophils Absolute: 0 10*3/uL (ref 0.0–0.1)
Basophils Relative: 1 %
Eosinophils Absolute: 0 10*3/uL (ref 0.0–0.5)
Eosinophils Relative: 1 %
HCT: 40 % (ref 39.0–52.0)
Hemoglobin: 13.3 g/dL (ref 13.0–17.0)
Immature Granulocytes: 0 %
Lymphocytes Relative: 15 %
Lymphs Abs: 0.5 10*3/uL — ABNORMAL LOW (ref 0.7–4.0)
MCH: 32.3 pg (ref 26.0–34.0)
MCHC: 33.3 g/dL (ref 30.0–36.0)
MCV: 97.1 fL (ref 80.0–100.0)
Monocytes Absolute: 0.4 10*3/uL (ref 0.1–1.0)
Monocytes Relative: 13 %
Neutro Abs: 2.2 10*3/uL (ref 1.7–7.7)
Neutrophils Relative %: 70 %
Platelet Count: 88 10*3/uL — ABNORMAL LOW (ref 150–400)
RBC: 4.12 MIL/uL — ABNORMAL LOW (ref 4.22–5.81)
RDW: 13.2 % (ref 11.5–15.5)
WBC Count: 3.1 10*3/uL — ABNORMAL LOW (ref 4.0–10.5)
nRBC: 0 % (ref 0.0–0.2)

## 2019-09-21 LAB — CMP (CANCER CENTER ONLY)
ALT: 11 U/L (ref 0–44)
AST: 15 U/L (ref 15–41)
Albumin: 3.4 g/dL — ABNORMAL LOW (ref 3.5–5.0)
Alkaline Phosphatase: 37 U/L — ABNORMAL LOW (ref 38–126)
Anion gap: 4 — ABNORMAL LOW (ref 5–15)
BUN: 9 mg/dL (ref 8–23)
CO2: 27 mmol/L (ref 22–32)
Calcium: 9.3 mg/dL (ref 8.9–10.3)
Chloride: 111 mmol/L (ref 98–111)
Creatinine: 0.7 mg/dL (ref 0.61–1.24)
GFR, Est AFR Am: >60 mL/min (ref >60)
GFR, Estimated: >60 mL/min (ref >60)
Glucose, Bld: 84 mg/dL (ref 70–99)
Potassium: 3.8 mmol/L (ref 3.5–5.1)
Sodium: 142 mmol/L (ref 135–145)
Total Bilirubin: 0.9 mg/dL (ref 0.3–1.2)
Total Protein: 5.6 g/dL — ABNORMAL LOW (ref 6.5–8.1)

## 2019-09-21 MED ORDER — DEXTROSE 5 % IV SOLN
56.0000 mg/m2 | Freq: Once | INTRAVENOUS | Status: AC
  Administered 2019-09-21: 120 mg via INTRAVENOUS
  Filled 2019-09-21: qty 60

## 2019-09-21 MED ORDER — SODIUM CHLORIDE 0.9 % IV SOLN
INTRAVENOUS | Status: DC
  Filled 2019-09-21: qty 250

## 2019-09-21 MED ORDER — MONTELUKAST SODIUM 10 MG PO TABS
ORAL_TABLET | ORAL | Status: AC
  Filled 2019-09-21: qty 1

## 2019-09-21 MED ORDER — DEXAMETHASONE 4 MG PO TABS
20.0000 mg | ORAL_TABLET | Freq: Once | ORAL | Status: AC
  Administered 2019-09-21: 20 mg via ORAL

## 2019-09-21 MED ORDER — DIPHENHYDRAMINE HCL 25 MG PO CAPS
ORAL_CAPSULE | ORAL | Status: AC
  Filled 2019-09-21: qty 2

## 2019-09-21 MED ORDER — DARATUMUMAB-HYALURONIDASE-FIHJ 1800-30000 MG-UT/15ML ~~LOC~~ SOLN
1800.0000 mg | Freq: Once | SUBCUTANEOUS | Status: AC
  Administered 2019-09-21: 1800 mg via SUBCUTANEOUS
  Filled 2019-09-21: qty 15

## 2019-09-21 MED ORDER — MONTELUKAST SODIUM 10 MG PO TABS
10.0000 mg | ORAL_TABLET | Freq: Once | ORAL | Status: AC
  Administered 2019-09-21: 10 mg via ORAL

## 2019-09-21 MED ORDER — DIPHENHYDRAMINE HCL 25 MG PO CAPS
50.0000 mg | ORAL_CAPSULE | Freq: Once | ORAL | Status: AC
  Administered 2019-09-21: 50 mg via ORAL

## 2019-09-21 MED ORDER — DEXAMETHASONE 4 MG PO TABS
ORAL_TABLET | ORAL | Status: AC
  Filled 2019-09-21: qty 5

## 2019-09-21 MED ORDER — ACETAMINOPHEN 325 MG PO TABS
650.0000 mg | ORAL_TABLET | Freq: Once | ORAL | Status: AC
  Administered 2019-09-21: 650 mg via ORAL

## 2019-09-21 MED ORDER — ACETAMINOPHEN 325 MG PO TABS
ORAL_TABLET | ORAL | Status: AC
  Filled 2019-09-21: qty 2

## 2019-09-21 NOTE — Patient Instructions (Signed)
[place] Discharge Instructions for Patients Receiving Chemotherapy  Today you received the following chemotherapy agents: Kyprolis, Darzalex  To help prevent nausea and vomiting after your treatment, we encourage you to take your nausea medication as directed.   If you develop nausea and vomiting that is not controlled by your nausea medication, call the clinic.   BELOW ARE SYMPTOMS THAT SHOULD BE REPORTED IMMEDIATELY:  *FEVER GREATER THAN 100.5 F  *CHILLS WITH OR WITHOUT FEVER  NAUSEA AND VOMITING THAT IS NOT CONTROLLED WITH YOUR NAUSEA MEDICATION  *UNUSUAL SHORTNESS OF BREATH  *UNUSUAL BRUISING OR BLEEDING  TENDERNESS IN MOUTH AND THROAT WITH OR WITHOUT PRESENCE OF ULCERS  *URINARY PROBLEMS  *BOWEL PROBLEMS  UNUSUAL RASH Items with * indicate a potential emergency and should be followed up as soon as possible.  Feel free to call the clinic should you have any questions or concerns. The clinic phone number is [phone].  Please show the Green at check-in to the Emergency Department and triage nurse.

## 2019-09-21 NOTE — Progress Notes (Signed)
Mitchell Lowery   Diagnosis: Multiple myeloma  INTERVAL HISTORY:   Mitchell Lowery was last treated with carfilzomib and daratumumab on 09/07/2019.  He continues to have exertional dyspnea and chest tightness.  No other complaint.  He continues to have a superficial ulceration at the left lower leg.  No fever or erythema.  Mitchell Lowery is undergoing evaluation for a TAVR procedure.  Objective:  Vital signs in last 24 hours:  Blood pressure 128/65, pulse 61, temperature 97.6 F (36.4 C), temperature source Axillary, resp. rate 17, height '5\' 11"'  (1.803 m), weight 175 lb (79.4 kg), SpO2 98 %.    Resp: Lungs clear bilaterally Cardio: Regular rate and rhythm, 2/6 systolic murmur Vascular: Trace edema at the left greater than right lower leg  Skin: Superficial ulceration at the left lower leg without surrounding erythema.  Erythematous lesion with superficial desquamation at the right lower pretibial area  Portacath/PICC-without erythema  Lab Results:  Lab Results  Component Value Date   WBC 3.1 (L) 09/21/2019   HGB 13.3 09/21/2019   HCT 40.0 09/21/2019   MCV 97.1 09/21/2019   PLT 88 (L) 09/21/2019   NEUTROABS 2.2 09/21/2019    CMP  Lab Results  Component Value Date   NA 141 09/07/2019   K 3.7 09/07/2019   CL 110 09/07/2019   CO2 24 09/07/2019   GLUCOSE 101 (H) 09/07/2019   BUN 7 (L) 09/07/2019   CREATININE 0.69 09/07/2019   CALCIUM 9.2 09/07/2019   PROT 5.6 (L) 09/07/2019   ALBUMIN 3.3 (L) 09/07/2019   AST 14 (L) 09/07/2019   ALT 10 09/07/2019   ALKPHOS 37 (L) 09/07/2019   BILITOT 0.8 09/07/2019   GFRNONAA >60 09/07/2019   GFRAA >60 09/07/2019    Medications: I have reviewed the patient's current medications.   Assessment/Plan: 1. Multiple myeloma-confirmed on a bone marrow biopsy 08/07/2014, IgA lambda  Serum M spike and increased serum free lambda light chains  Myeloma FISH panel negative for chromosome 4, 11, 12, 13, 14, and  17 abnormalities, cytogenetics with no metaphases  Bone survey 08/15/2014 with indeterminant lucent skull lesions  Cycle 1 RVD 08/22/2014  Cycle 2 RVD 09/19/2014  Cycle 3 RVD 10/17/2014  Cycle 4 RVD 11/14/2014 (Decadron reduced to 20 mg weekly, Revlimid 15 mg days 1 through 14, Velcade 3/4 weeks)  Cycle 5 RVD 12/12/2014  Serum M spike not detected 12/26/2014  Cycle 6 RVD 01/09/2015  Maintenance Revlimid, 10 mg daily, 02/05/2015 , discontinue 02/26/2015 secondary to leg edema and diarrhea  Revlimid resumed at a dose of 10 mg every other day beginning 03/13/2015  PET scan 50/27/7412-IN hypermetabolic bone lesions, few lucent lesions in the spine and pelvis  Revlimid discontinued January 2019  Enrollment on a clinical trial at [facility] with pomalidomide/Decadron+/- ixazomibbeginning 03/10/2017  Treatment discontinued February 2021 secondary to rising serum lambda light chains  PET 04/05/2019-hypermetabolic right axillary/chest wall nodes, right paravertebral mass, small right pleural effusion, retrocrural node, right lateral abdominal wall mass, L5 lesion mild compression deformity. Focus of hypermetabolism at the anterior left pelvic wall without a CT correlate. Hypermetabolic lytic lesion in the left T3 transverse process  Cycle 1 daratumumab/carfilzomib/Decadron 04/05/2019  Radiation to the right chest wall mass and lumbar spine beginning 04/25/2019  Cycle 2 daratumumab/carfilzomib/Decadron 05/04/2019 (carfilzomib dose escalated); day 8 held due to neutropenia, thrombocytopenia; day 15 05/18/2019 (carfilzomib dose reduced)  Daratumumab/carfilzomib/Decadron changed to every 2-week dosing beginning 05/25/2019  Daratumumab changed to a monthly schedule beginning 09/21/2019  2.  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  History of intermittent prostatitis  4. Skin rash 10/24/2014-referred to dermatology, biopsy consistent with local hypersensitivity reaction  5. 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 Left lower leg cellulitis 06/24/2015-treated with Keflex  7. History of mild thrombocytopenia secondary tomyeloma and systemic therapy  8. History of mild neutropenia-secondary to myeloma and systemic therapy  9. Fall with a skull fracture and subarachnoid blood/right frontal lobe contusions 10/17/2015  10. Right lower lobe pulmonary embolus 03/28/2016. Lovenox , transitioned to Xarelto  11. Vesicular rashJune 2018-potentially related to the zoster vaccine, resolved  12. History of atrial fibrillation  13. Aortic stenosis  14. 07/02/2019-hospital admission for sepsis secondary to cellulitis of the left lower extremity with gram-negative rod bacteremia(Aeromonascaviae)    Disposition: Mitchell Lowery appears stable.  He is in clinical remission from multiple myeloma.  He saw Mitchell Lowery for a telehealth visit last week.  Mitchell Lowery recommends continuing the current treatment.  Mitchell Lowery plans to proceed with evaluation for the TAVR procedure.  He will return for an office visit and carfilzomib in 2 weeks.  We will check a myeloma panel when he returns in 2 weeks.  He received the COVID-19 booster vaccine today.  Mitchell Coder, MD  09/21/2019  11:04 AM

## 2019-09-21 NOTE — Progress Notes (Signed)
Per [name]: OK to treat today w/platelets 88,000

## 2019-09-22 ENCOUNTER — Telehealth: Payer: Self-pay | Admitting: Oncology

## 2019-09-22 NOTE — Telephone Encounter (Signed)
Scheduled appointments per 8/25 los. Spoke with patient's wife who is aware of all appointments dates and times.

## 2019-09-22 NOTE — Telephone Encounter (Signed)
Spoke to the patient and he agrees with treatment plan.  New MyChart message sent per his request as he can't find the other one with instructions.  He understands to reply once received.

## 2019-09-26 MED FILL — XARELTO 20 MG TABLET: 20 | 30 days supply | Qty: 30 | Fill #6

## 2019-09-28 ENCOUNTER — Other Ambulatory Visit (HOSPITAL_COMMUNITY)
Admission: RE | Admit: 2019-09-28 | Discharge: 2019-09-28 | Disposition: A | Discharge status: Home / Self Care | Payer: Medicare PPO | Source: Ambulatory Visit | Attending: Cardiovascular Disease | Admitting: Cardiovascular Disease

## 2019-09-28 DIAGNOSIS — Z20822 Contact with and (suspected) exposure to covid-19: Secondary | ICD-10-CM | POA: Diagnosis not present

## 2019-09-28 DIAGNOSIS — Z01812 Encounter for preprocedural laboratory examination: Secondary | ICD-10-CM | POA: Diagnosis not present

## 2019-09-28 LAB — SARS CORONAVIRUS 2 (TAT 6-24 HRS): SARS Coronavirus 2: NEGATIVE

## 2019-09-29 ENCOUNTER — Telehealth: Payer: Self-pay | Admitting: *Deleted

## 2019-09-29 NOTE — Telephone Encounter (Signed)
Pt contacted pre-catheterization scheduled at [facility] for: Friday September 30, 2019 11:30 AM Verified arrival time and place: [facility]) at: 9:30 AM   No solid food after midnight prior to cath, clear liquids until 5 AM day of procedure.  Hold: Xarelto-none 09/28/19 until post procedure  Except hold medications AM meds can be  taken pre-cath with sips of water including: ASA 81 mg   Confirmed patient has responsible adult to drive home post procedure and be with patient first 24 hours after arriving home: yes  You are allowed ONE visitor in the waiting room during the time you are at the hospital for your procedure. Both you and your visitor must wear a mask once you enter the hospital.       COVID-19 Pre-Screening Questions:   In the past 10 days have you had a new cough, shortness of breath, headache, congestion, fever (100 or greater) unexplained body aches, new sore throat, or sudden loss of taste or sense of smell? no  In the past 10 days have you been around anyone with known Covid 19? no  Have you been vaccinated for COVID-19? Yes, see immunization history   Reviewed procedure/mask/visitor instructions, COVID-19 questions with patient.

## 2019-09-30 ENCOUNTER — Encounter (HOSPITAL_COMMUNITY)
Admission: RE | Disposition: A | Discharge status: Home / Self Care | Payer: Medicare PPO | Source: Home / Self Care | Attending: Cardiovascular Disease

## 2019-09-30 ENCOUNTER — Other Ambulatory Visit: Payer: Self-pay

## 2019-09-30 ENCOUNTER — Encounter: Payer: Self-pay | Admitting: Physician Assistant

## 2019-09-30 ENCOUNTER — Ambulatory Visit (HOSPITAL_COMMUNITY)
Admission: RE | Admit: 2019-09-30 | Discharge: 2019-09-30 | Disposition: A | Discharge status: Home / Self Care | Payer: Medicare PPO | Attending: Cardiovascular Disease | Admitting: Cardiovascular Disease

## 2019-09-30 DIAGNOSIS — Z79899 Other long term (current) drug therapy: Secondary | ICD-10-CM | POA: Insufficient documentation

## 2019-09-30 DIAGNOSIS — I352 Nonrheumatic aortic (valve) stenosis with insufficiency: Secondary | ICD-10-CM | POA: Diagnosis not present

## 2019-09-30 DIAGNOSIS — I4892 Unspecified atrial flutter: Secondary | ICD-10-CM | POA: Insufficient documentation

## 2019-09-30 DIAGNOSIS — D696 Thrombocytopenia, unspecified: Secondary | ICD-10-CM | POA: Diagnosis not present

## 2019-09-30 DIAGNOSIS — I2510 Atherosclerotic heart disease of native coronary artery without angina pectoris: Secondary | ICD-10-CM | POA: Diagnosis not present

## 2019-09-30 DIAGNOSIS — I350 Nonrheumatic aortic (valve) stenosis: Secondary | ICD-10-CM

## 2019-09-30 DIAGNOSIS — I480 Paroxysmal atrial fibrillation: Secondary | ICD-10-CM | POA: Diagnosis not present

## 2019-09-30 DIAGNOSIS — Z86711 Personal history of pulmonary embolism: Secondary | ICD-10-CM | POA: Insufficient documentation

## 2019-09-30 DIAGNOSIS — Z8546 Personal history of malignant neoplasm of prostate: Secondary | ICD-10-CM | POA: Insufficient documentation

## 2019-09-30 DIAGNOSIS — C9000 Multiple myeloma not having achieved remission: Secondary | ICD-10-CM | POA: Diagnosis not present

## 2019-09-30 DIAGNOSIS — Z7901 Long term (current) use of anticoagulants: Secondary | ICD-10-CM | POA: Insufficient documentation

## 2019-09-30 DIAGNOSIS — I10 Essential (primary) hypertension: Secondary | ICD-10-CM | POA: Diagnosis not present

## 2019-09-30 HISTORY — PX: RIGHT/LEFT HEART CATH AND CORONARY ANGIOGRAPHY: CATH118266

## 2019-09-30 LAB — POCT I-STAT EG7
Acid-Base Excess: 0 mmol/L (ref 0.0–2.0)
Bicarbonate: 26.4 mmol/L (ref 20.0–28.0)
Calcium, Ion: 1.21 mmol/L (ref 1.15–1.40)
HCT: 36 % — ABNORMAL LOW (ref 39.0–52.0)
Hemoglobin: 12.2 g/dL — ABNORMAL LOW (ref 13.0–17.0)
O2 Saturation: 82 %
Potassium: 3.7 mmol/L (ref 3.5–5.1)
Sodium: 144 mmol/L (ref 135–145)
TCO2: 28 mmol/L (ref 22–32)
pCO2, Ven: 47.7 mmHg (ref 44.0–60.0)
pH, Ven: 7.35 (ref 7.250–7.430)
pO2, Ven: 49 mmHg — ABNORMAL HIGH (ref 32.0–45.0)

## 2019-09-30 LAB — POCT I-STAT 7, (LYTES, BLD GAS, ICA,H+H)
Acid-Base Excess: 1 mmol/L (ref 0.0–2.0)
Bicarbonate: 27 mmol/L (ref 20.0–28.0)
Calcium, Ion: 1.24 mmol/L (ref 1.15–1.40)
HCT: 36 % — ABNORMAL LOW (ref 39.0–52.0)
Hemoglobin: 12.2 g/dL — ABNORMAL LOW (ref 13.0–17.0)
O2 Saturation: 100 %
Potassium: 3.7 mmol/L (ref 3.5–5.1)
Sodium: 144 mmol/L (ref 135–145)
TCO2: 28 mmol/L (ref 22–32)
pCO2 arterial: 47.3 mmHg (ref 32.0–48.0)
pH, Arterial: 7.364 (ref 7.350–7.450)
pO2, Arterial: 189 mmHg — ABNORMAL HIGH (ref 83.0–108.0)

## 2019-09-30 SURGERY — RIGHT/LEFT HEART CATH AND CORONARY ANGIOGRAPHY
Anesthesia: LOCAL

## 2019-09-30 MED ORDER — SODIUM CHLORIDE 0.9 % WEIGHT BASED INFUSION: 1.0000 mL/kg/h | INTRAVENOUS | Status: DC

## 2019-09-30 MED ORDER — SODIUM CHLORIDE 0.9 % IV SOLN: 250.0000 mL | INTRAVENOUS | Status: DC | PRN

## 2019-09-30 MED ORDER — SODIUM CHLORIDE 0.9% FLUSH: 3.0000 mL | Freq: Two times a day (BID) | INTRAVENOUS | Status: DC

## 2019-09-30 MED ORDER — LIDOCAINE HCL (PF) 1 % IJ SOLN
INTRAMUSCULAR | Status: AC
  Filled 2019-09-30: qty 30

## 2019-09-30 MED ORDER — VERAPAMIL HCL 2.5 MG/ML IV SOLN
INTRAVENOUS | Status: DC | PRN
  Administered 2019-09-30: 10 mL via INTRA_ARTERIAL

## 2019-09-30 MED ORDER — IOHEXOL 350 MG/ML SOLN
INTRAVENOUS | Status: DC | PRN
  Administered 2019-09-30: 70 mL

## 2019-09-30 MED ORDER — HEPARIN SODIUM (PORCINE) 1000 UNIT/ML IJ SOLN
INTRAMUSCULAR | Status: AC
  Filled 2019-09-30: qty 1

## 2019-09-30 MED ORDER — MIDAZOLAM HCL 2 MG/2ML IJ SOLN
INTRAMUSCULAR | Status: AC
  Filled 2019-09-30: qty 2

## 2019-09-30 MED ORDER — SODIUM CHLORIDE 0.9 % WEIGHT BASED INFUSION
3.0000 mL/kg/h | INTRAVENOUS | Status: AC
  Administered 2019-09-30: 3 mL/kg/h via INTRAVENOUS

## 2019-09-30 MED ORDER — LIDOCAINE HCL (PF) 1 % IJ SOLN
INTRAMUSCULAR | Status: DC | PRN
  Administered 2019-09-30 (×2): 2 mL

## 2019-09-30 MED ORDER — MIDAZOLAM HCL 2 MG/2ML IJ SOLN
INTRAMUSCULAR | Status: DC | PRN
  Administered 2019-09-30: 1 mg via INTRAVENOUS

## 2019-09-30 MED ORDER — HEPARIN SODIUM (PORCINE) 1000 UNIT/ML IJ SOLN
INTRAMUSCULAR | Status: DC | PRN
  Administered 2019-09-30: 4000 [IU] via INTRAVENOUS

## 2019-09-30 MED ORDER — VERAPAMIL HCL 2.5 MG/ML IV SOLN
INTRAVENOUS | Status: AC
  Filled 2019-09-30: qty 2

## 2019-09-30 MED ORDER — ACETAMINOPHEN 325 MG PO TABS: 650.0000 mg | ORAL_TABLET | ORAL | Status: DC | PRN

## 2019-09-30 MED ORDER — SODIUM CHLORIDE 0.9% FLUSH: 3.0000 mL | INTRAVENOUS | Status: DC | PRN

## 2019-09-30 MED ORDER — HEPARIN (PORCINE) IN NACL 1000-0.9 UT/500ML-% IV SOLN
INTRAVENOUS | Status: DC | PRN
  Administered 2019-09-30 (×2): 500 mL

## 2019-09-30 MED ORDER — HYDRALAZINE HCL 20 MG/ML IJ SOLN: 10.0000 mg | INTRAMUSCULAR | Status: DC | PRN

## 2019-09-30 MED ORDER — ASPIRIN 81 MG PO CHEW: 81.0000 mg | CHEWABLE_TABLET | ORAL | Status: DC

## 2019-09-30 MED ORDER — HEPARIN (PORCINE) IN NACL 1000-0.9 UT/500ML-% IV SOLN
INTRAVENOUS | Status: AC
  Filled 2019-09-30: qty 1000

## 2019-09-30 MED ORDER — ONDANSETRON HCL 4 MG/2ML IJ SOLN: 4.0000 mg | Freq: Four times a day (QID) | INTRAMUSCULAR | Status: DC | PRN

## 2019-09-30 MED ORDER — FENTANYL CITRATE (PF) 100 MCG/2ML IJ SOLN
INTRAMUSCULAR | Status: AC
  Filled 2019-09-30: qty 2

## 2019-09-30 MED ORDER — FENTANYL CITRATE (PF) 100 MCG/2ML IJ SOLN
INTRAMUSCULAR | Status: DC | PRN
  Administered 2019-09-30: 25 ug via INTRAVENOUS

## 2019-09-30 MED ORDER — LABETALOL HCL 5 MG/ML IV SOLN: 10.0000 mg | INTRAVENOUS | Status: DC | PRN

## 2019-09-30 SURGICAL SUPPLY — 10 items
CATH 5FR JL3.5 JR4 ANG PIG MP (CATHETERS) ×2 IMPLANT
CATH SWAN DBL LUMAN 5F 110 (CATHETERS) ×2 IMPLANT
DEVICE RAD COMP TR BAND LRG (VASCULAR PRODUCTS) ×2 IMPLANT
GLIDESHEATH SLEND SS 6F .021 (SHEATH) ×4 IMPLANT
GUIDEWIRE INQWIRE 1.5J.035X260 (WIRE) ×1 IMPLANT
INQWIRE 1.5J .035X260CM (WIRE) ×2
KIT HEART LEFT (KITS) ×2 IMPLANT
PACK CARDIAC CATHETERIZATION (CUSTOM PROCEDURE TRAY) ×2 IMPLANT
TRANSDUCER W/STOPCOCK (MISCELLANEOUS) ×2 IMPLANT
TUBING CIL FLEX 10 FLL-RA (TUBING) ×2 IMPLANT

## 2019-09-30 NOTE — Interval H&P Note (Signed)
History and Physical Interval Note:  09/30/2019 10:32 AM  Mitchell Lowery  has presented today for surgery, with the diagnosis of aortic stenosis.  The various methods of treatment have been discussed with the patient and family. After consideration of risks, benefits and other options for treatment, the patient has consented to  Procedure(s): RIGHT/LEFT HEART CATH AND CORONARY ANGIOGRAPHY (N/A) as a surgical intervention.  The patient's history has been reviewed, patient examined, no change in status, stable for surgery.  I have reviewed the patient's chart and labs.  Questions were answered to the patient's satisfaction.     Sherren Mocha

## 2019-09-30 NOTE — Discharge Instructions (Signed)
Radial Site Care  This sheet gives you information about how to care for yourself after your procedure. Your health care provider may also give you more specific instructions. If you have problems or questions, contact your health care provider. What can I expect after the procedure? After the procedure, it is common to have:  Bruising and tenderness at the catheter insertion area. Follow these instructions at home: Medicines  Take over-the-counter and prescription medicines only as told by your health care provider. Insertion site care  Follow instructions from your health care provider about how to take care of your insertion site. Make sure you: ? Wash your hands with soap and water before you change your bandage (dressing). If soap and water are not available, use hand sanitizer. ? Change your dressing as told by your health care provider. ? Leave stitches (sutures), skin glue, or adhesive strips in place. These skin closures may need to stay in place for 2 weeks or longer. If adhesive strip edges start to loosen and curl up, you may trim the loose edges. Do not remove adhesive strips completely unless your health care provider tells you to do that.  Check your insertion site every day for signs of infection. Check for: ? Redness, swelling, or pain. ? Fluid or blood. ? Pus or a bad smell. ? Warmth.  Do not take baths, swim, or use a hot tub until your health care provider approves.  You may shower 24-48 hours after the procedure, or as directed by your health care provider. ? Remove the dressing and gently wash the site with plain soap and water. ? Pat the area dry with a clean towel. ? Do not rub the site. That could cause bleeding.  Do not apply powder or lotion to the site. Activity   For 24 hours after the procedure, or as directed by your health care provider: ? Do not flex or bend the affected arm. ? Do not push or pull heavy objects with the affected arm. ? Do not  drive yourself home from the hospital or clinic. You may drive 24 hours after the procedure unless your health care provider tells you not to. ? Do not operate machinery or power tools.  Do not lift anything that is heavier than 10 lb (4.5 kg), or the limit that you are told, until your health care provider says that it is safe.  Ask your health care provider when it is okay to: ? Return to work or school. ? Resume usual physical activities or sports. ? Resume sexual activity. General instructions  If the catheter site starts to bleed, raise your arm and put firm pressure on the site. If the bleeding does not stop, get help right away. This is a medical emergency.  If you went home on the same day as your procedure, a responsible adult should be with you for the first 24 hours after you arrive home.  Keep all follow-up visits as told by your health care provider. This is important. Contact a health care provider if:  You have a fever.  You have redness, swelling, or yellow drainage around your insertion site. Get help right away if:  You have unusual pain at the radial site.  The catheter insertion area swells very fast.  The insertion area is bleeding, and the bleeding does not stop when you hold steady pressure on the area.  Your arm or hand becomes pale, cool, tingly, or numb. These symptoms may represent a serious problem   that is an emergency. Do not wait to see if the symptoms will go away. Get medical help right away. Call your local emergency services (911 in the [place] Do not drive yourself to the hospital. Summary  After the procedure, it is common to have bruising and tenderness at the site.  Follow instructions from your health care provider about how to take care of your radial site wound. Check the wound every day for signs of infection.  Do not lift anything that is heavier than 10 lb (4.5 kg), or the limit that you are told, until your health care provider says  that it is safe. This information is not intended to replace advice given to you by your health care provider. Make sure you discuss any questions you have with your health care provider. Document Revised: 02/18/2017 Document Reviewed: 02/18/2017 Elsevier Patient Education  2020 [employer]  

## 2019-10-01 ENCOUNTER — Encounter: Payer: Self-pay | Admitting: Physician Assistant

## 2019-10-03 ENCOUNTER — Other Ambulatory Visit: Payer: Self-pay | Admitting: Oncology

## 2019-10-04 ENCOUNTER — Encounter (HOSPITAL_COMMUNITY): Payer: Self-pay | Admitting: Cardiovascular Disease

## 2019-10-05 ENCOUNTER — Encounter: Payer: Self-pay | Admitting: Nurse Practitioner

## 2019-10-05 ENCOUNTER — Other Ambulatory Visit: Payer: Self-pay

## 2019-10-05 ENCOUNTER — Inpatient Hospital Stay: Payer: Medicare PPO

## 2019-10-05 ENCOUNTER — Encounter: Payer: Self-pay | Admitting: Surgery

## 2019-10-05 ENCOUNTER — Other Ambulatory Visit: Payer: Medicare PPO

## 2019-10-05 ENCOUNTER — Inpatient Hospital Stay: Payer: Medicare PPO | Attending: Oncology | Admitting: Nurse Practitioner

## 2019-10-05 ENCOUNTER — Institutional Professional Consult (permissible substitution) (INDEPENDENT_AMBULATORY_CARE_PROVIDER_SITE_OTHER): Payer: Medicare PPO | Admitting: Surgery

## 2019-10-05 VITALS — BP 116/69 | HR 62 | Resp 18 | Ht 71.0 in | Wt 173.4 lb

## 2019-10-05 VITALS — BP 104/66 | HR 77 | Temp 97.7°F | Resp 20 | Ht 71.0 in | Wt 171.0 lb

## 2019-10-05 DIAGNOSIS — Z5111 Encounter for antineoplastic chemotherapy: Secondary | ICD-10-CM | POA: Diagnosis not present

## 2019-10-05 DIAGNOSIS — C9000 Multiple myeloma not having achieved remission: Secondary | ICD-10-CM | POA: Insufficient documentation

## 2019-10-05 DIAGNOSIS — I350 Nonrheumatic aortic (valve) stenosis: Secondary | ICD-10-CM

## 2019-10-05 DIAGNOSIS — Z79899 Other long term (current) drug therapy: Secondary | ICD-10-CM | POA: Insufficient documentation

## 2019-10-05 LAB — CBC WITH DIFFERENTIAL (CANCER CENTER ONLY)
Abs Immature Granulocytes: 0.01 10*3/uL (ref 0.00–0.07)
Basophils Absolute: 0 10*3/uL (ref 0.0–0.1)
Basophils Relative: 1 %
Eosinophils Absolute: 0 10*3/uL (ref 0.0–0.5)
Eosinophils Relative: 1 %
HCT: 42 % (ref 39.0–52.0)
Hemoglobin: 13.8 g/dL (ref 13.0–17.0)
Immature Granulocytes: 0 %
Lymphocytes Relative: 10 %
Lymphs Abs: 0.4 10*3/uL — ABNORMAL LOW (ref 0.7–4.0)
MCH: 32.2 pg (ref 26.0–34.0)
MCHC: 32.9 g/dL (ref 30.0–36.0)
MCV: 98.1 fL (ref 80.0–100.0)
Monocytes Absolute: 0.4 10*3/uL (ref 0.1–1.0)
Monocytes Relative: 11 %
Neutro Abs: 3.2 10*3/uL (ref 1.7–7.7)
Neutrophils Relative %: 77 %
Platelet Count: 92 10*3/uL — ABNORMAL LOW (ref 150–400)
RBC: 4.28 MIL/uL (ref 4.22–5.81)
RDW: 12.9 % (ref 11.5–15.5)
WBC Count: 4.1 10*3/uL (ref 4.0–10.5)
nRBC: 0 % (ref 0.0–0.2)

## 2019-10-05 LAB — CMP (CANCER CENTER ONLY)
ALT: 13 U/L (ref 0–44)
AST: 17 U/L (ref 15–41)
Albumin: 3.5 g/dL (ref 3.5–5.0)
Alkaline Phosphatase: 39 U/L (ref 38–126)
Anion gap: 10 (ref 5–15)
BUN: 9 mg/dL (ref 8–23)
CO2: 25 mmol/L (ref 22–32)
Calcium: 9.1 mg/dL (ref 8.9–10.3)
Chloride: 107 mmol/L (ref 98–111)
Creatinine: 0.71 mg/dL (ref 0.61–1.24)
GFR, Est AFR Am: >60 mL/min (ref >60)
GFR, Estimated: >60 mL/min (ref >60)
Glucose, Bld: 77 mg/dL (ref 70–99)
Potassium: 3.8 mmol/L (ref 3.5–5.1)
Sodium: 142 mmol/L (ref 135–145)
Total Bilirubin: 0.7 mg/dL (ref 0.3–1.2)
Total Protein: 6 g/dL — ABNORMAL LOW (ref 6.5–8.1)

## 2019-10-05 MED ORDER — ZOLEDRONIC ACID 4 MG/100ML IV SOLN
INTRAVENOUS | Status: AC
  Filled 2019-10-05: qty 100

## 2019-10-05 MED ORDER — DEXAMETHASONE 4 MG PO TABS
20.0000 mg | ORAL_TABLET | Freq: Once | ORAL | Status: AC
  Administered 2019-10-05: 20 mg via ORAL

## 2019-10-05 MED ORDER — PROCHLORPERAZINE MALEATE 10 MG PO TABS
ORAL_TABLET | ORAL | Status: AC
  Filled 2019-10-05: qty 1

## 2019-10-05 MED ORDER — DEXTROSE 5 % IV SOLN
56.0000 mg/m2 | Freq: Once | INTRAVENOUS | Status: AC
  Administered 2019-10-05: 120 mg via INTRAVENOUS
  Filled 2019-10-05: qty 60

## 2019-10-05 MED ORDER — SODIUM CHLORIDE 0.9 % IV SOLN
INTRAVENOUS | Status: DC
  Filled 2019-10-05: qty 250

## 2019-10-05 MED ORDER — ZOLEDRONIC ACID 4 MG/100ML IV SOLN
4.0000 mg | Freq: Once | INTRAVENOUS | Status: AC
  Administered 2019-10-05: 4 mg via INTRAVENOUS

## 2019-10-05 MED ORDER — PROCHLORPERAZINE MALEATE 10 MG PO TABS
10.0000 mg | ORAL_TABLET | Freq: Four times a day (QID) | ORAL | Status: DC | PRN
  Administered 2019-10-05: 10 mg via ORAL

## 2019-10-05 MED ORDER — DEXAMETHASONE 4 MG PO TABS
ORAL_TABLET | ORAL | Status: AC
  Filled 2019-10-05: qty 5

## 2019-10-05 NOTE — Progress Notes (Addendum)
Patient ID: SLATER MCMANAMAN, male   DOB: 1934/08/05, 85 y.o.   MRN: [id number]  [facility] CONSULTATION REPORT  Referring Provider is Sherren Mocha, MD Primary Cardiologist is Sherren Mocha, MD PCP is [name], MD  Chief Complaint  Patient presents with  . Aortic Stenosis    Surgical eval for TAVR, review all testing     HPI:  The patient is an active 85 year old gentleman with a history of paroxysmal atrial fibrillation on Xarelto, multiple myeloma followed by [name] and in remission on chemotherapy, as well as moderate aortic stenosis.  He had an echocardiogram on 08/16/2018 showing a mean gradient of 35 mmHg with a peak velocity of 3.8 m/s and a peak gradient of 58 mmHg.  There was felt to be some progression from the prior echo in June 2019 when the mean gradient was 26 mmHg.  He said that he uses a rowing machine several days per week and gets his heart rate up into the 120s usually doing short intervals with a total of 5000 m.  He has no symptoms with that level of exertion.  He says that when he is walking up a hill he develops discomfort across his chest that resolves with rest and usually does not recur when he continues walking.  This has been going on for approximately 6 months.  He said that this not associated with any shortness of breath.  He has had fatigue.  He denies orthopnea.  He denies dizziness and syncope.  He had a repeat echo on 09/01/2019 which showed an increase in the mean gradient to 40 mmHg with a peak gradient of 68 mmHg.  Aortic valve area was measured at 0.75 cm with a dimensionless index of 0.24.  Left ventricular ejection fraction was 55 to 60% with moderate LVH and grade 1 diastolic dysfunction.  The patient is here today with his wife.  He is a PhD Marketing executive.  His son is a physician in [place] and his son-in-law is Dr. Dierdre Harness, a neurosurgeon in [place].  Past Medical  History:  Diagnosis Date  . Anal fistula    treated in [place]  . Anemia    only due to myeloma- "normal now"  . CAD (coronary artery disease)   . Hemorrhoids   . Multiple myeloma (HCC)    1 month remission now-last tx. 1 month ago- /[name]  . Prostate cancer [facility])    urinary retention, surgery planned-prior radiation, and seed implant about 1 year ago.  . Prostatitis   . Pulmonary embolus, left ([place]) 03/28/2016  . Severe aortic stenosis   . UTI (lower urinary tract infection)    multiple urinary trract infection- being tx presently with antibiotic at present.    Past Surgical History:  Procedure Laterality Date  . ANAL FISTULECTOMY    . ANKLE SURGERY    . CYSTOSCOPY    . CYSTOSCOPY WITH INSERTION OF UROLIFT N/A 03/12/2015   Procedure: CYSTOSCOPY WITH INSERTION OF UROLIFT;  Surgeon: Franchot Gallo, MD;  Location: [facility];  Service: Urology;  Laterality: N/A;  . PROSTATE BIOPSY    . PROSTATE BIOPSY    . RADIOACTIVE SEED IMPLANT N/A 07/21/2014   Procedure: RADIOACTIVE SEED IMPLANT/BRACHYTHERAPY IMPLANT;  Surgeon: Franchot Gallo, MD;  Location: [facility];  Service: Urology;  Laterality: N/A;  . RIGHT/LEFT HEART CATH AND CORONARY ANGIOGRAPHY N/A 09/30/2019   Procedure: RIGHT/LEFT HEART CATH AND CORONARY ANGIOGRAPHY;  Surgeon:  Sherren Mocha, MD;  Location: [facility];  Service: Cardiovascular;  Laterality: N/A;  . TONSILLECTOMY      Family History  Problem Relation Age of Onset  . Cancer Maternal Grandfather        mouth/throat ca    Social History   Socioeconomic History  . Marital status: Married    Spouse name: Not on file  . Number of children: Not on file  . Years of education: Not on file  . Highest education level: Not on file  Occupational History  . Not on file  Tobacco Use  . Smoking status: Never Smoker  . Smokeless tobacco: Never Used  Vaping Use  . Vaping Use: Never used  Substance and Sexual Activity  . Alcohol use: Yes     Comment: 1 glass wine daily  . Drug use: No  . Sexual activity: Yes  Other Topics Concern  . Not on file  Social History Narrative  . Not on file   Social Determinants of Health   Financial Resource Strain:   . Difficulty of Paying Living Expenses: Not on file  Food Insecurity:   . Worried About Charity fundraiser in the Last Year: Not on file  . Ran Out of Food in the Last Year: Not on file  Transportation Needs:   . Lack of Transportation (Medical): Not on file  . Lack of Transportation (Non-Medical): Not on file  Physical Activity:   . Days of Exercise per Week: Not on file  . Minutes of Exercise per Session: Not on file  Stress:   . Feeling of Stress : Not on file  Social Connections:   . Frequency of Communication with Friends and Family: Not on file  . Frequency of Social Gatherings with Friends and Family: Not on file  . Attends Religious Services: Not on file  . Active Member of Clubs or Organizations: Not on file  . Attends Archivist Meetings: Not on file  . Marital Status: Not on file  Intimate Partner Violence:   . Fear of Current or Ex-Partner: Not on file  . Emotionally Abused: Not on file  . Physically Abused: Not on file  . Sexually Abused: Not on file    Current Outpatient Medications  Medication Sig Dispense Refill  . Cholecalciferol 2000 units TABS Take 2,000 Units by mouth daily.     . diphenoxylate-atropine (LOMOTIL) 2.5-0.025 MG tablet Take 1 tablet by mouth daily as needed for diarrhea or loose stools. Usually takes 1 tablet a week    . losartan (COZAAR) 50 MG tablet Take 50 mg by mouth daily.     . Psyllium (QC NATURAL VEGETABLE) 95 % POWD Take 1 packet by mouth daily.     . valACYclovir (VALTREX) 500 MG tablet Take 500 mg by mouth daily.     . vitamin B-12 (CYANOCOBALAMIN) 500 MCG tablet Take 500 mcg by mouth daily.    Alveda Reasons 20 MG TABS tablet TAKE 1 TABLET BY MOUTH DAILY WITH SUPPER. (Patient taking differently: Take 20 mg by  mouth daily with supper. ) 30 tablet 11   No current facility-administered medications for this visit.    Allergies  Allergen Reactions  . Bee Venom Other (See Comments)    Fatigue and flushing      Review of Systems:   General:  normal appetite, + decreased energy, no weight gain, no weight loss, no fever  Cardiac:  + chest pain with exertion, no chest pain at  rest, noSOB with exertion, no resting SOB, no PND, no orthopnea, no palpitations, no arrhythmia, no atrial fibrillation, no LE edema, no dizzy spells, no syncope  Respiratory:  no shortness of breath, no home oxygen, no productive cough, no dry cough, no bronchitis, no wheezing, no hemoptysis, no asthma, no pain with inspiration or cough, no sleep apnea, no CPAP at night  GI:   no difficulty swallowing, no reflux, no frequent heartburn, no hiatal hernia, no abdominal pain, + constipation, + diarrhea, no hematochezia, no hematemesis, no melena  GU:   no dysuria,  no frequency, no urinary tract infection, no hematuria, no enlarged prostate, no kidney stones, no kidney disease  Vascular:  no pain suggestive of claudication, no pain in feet, no leg cramps, no varicose veins, no DVT, no non-healing foot ulcer  Neuro:   no stroke, no TIA's, no seizures, no headaches, no temporary blindness one eye,  no slurred speech, no peripheral neuropathy, no chronic pain, no instability of gait, no memory/cognitive dysfunction  Musculoskeletal: no arthritis, no joint swelling, + myalgias, no difficulty walking, normal mobility   Skin:  no rash, no itching, no skin infections, no pressure sores or ulcerations  Psych:   no anxiety, no depression, no nervousness, no unusual recent stress  Eyes:   no blurry vision, no floaters, no recent vision changes, + wears glasses   ENT:   no hearing loss, no loose or painful teeth, no dentures, last saw dentist 09/29/19  Hematologic:  no easy bruising, no abnormal bleeding, no clotting disorder, no frequent  epistaxis  Endocrine:  no diabetes, does not check CBG's at home           Physical Exam:   BP 104/66   Pulse 77   Temp 97.7 F (36.5 C) (Skin)   Resp 20   Ht 5' 11" (1.803 m)   Wt 171 lb (77.6 kg)   SpO2 96% Comment: RA  BMI 23.85 kg/m   General:  Elderly but  well-appearing and fit.  HEENT:  Unremarkable, NCAT, PERLA, EOMI  Neck:   no JVD, no bruits, no adenopathy   Chest:   clear to auscultation, symmetrical breath sounds, no wheezes, no rhonchi   CV:   RRR, grade lll/VI crescendo/decrescendo murmur heard best at RSB,  no diastolic murmur  Abdomen:  soft, non-tender, no masses   Extremities:  warm, well-perfused, pulses palpable at ankle, no LE edema  Rectal/GU  Deferred  Neuro:   Grossly non-focal and symmetrical throughout  Skin:   Clean and dry, no rashes, no breakdown   Diagnostic Tests:  ECHOCARDIOGRAM REPORT       Patient Name:  TRAMELL PIECHOTA Date of Exam: 09/01/2019  Medical Rec #: [id number]    Height:    71.0 in  Accession #:  [id number]    Weight:    173.7 lb  Date of Birth: 1934-08-09    BSA:     1.986 m  Patient Age:  34 years     BP:      116/72 mmHg  Patient Gender: M        HR:      59 bpm.  Exam Location: [place]   Procedure: 2D Echo, Cardiac Doppler and Color Doppler                 MODIFIED REPORT:  This report was modified by Cherlynn Kaiser MD on 09/02/2019 due to  revision.  Indications:   I35.0 Nonrheumatic aortic (valve) stenosis  History:     Patient has prior history of Echocardiogram examinations,  most          recent 08/16/2018. Left pulmonary embolus. Multiple  myeloma.          Malignant neoplasm of prostate.    Sonographer:   Diamond Nickel RCS  Referring Phys: [place]  Diagnosing Phys: Cherlynn Kaiser MD   IMPRESSIONS    1. Severe aortic valve stenosis. Severely calcified aortic valve.  Dimensionless  index 0.24. Maximum gradient obtained from apical window.  The aortic valve is abnormal. Aortic valve regurgitation is trivial.  Aortic valve area, by VTI measures 0.75 cm.  Aortic valve mean gradient measures 40.0 mmHg. Aortic valve Vmax measures  4.12 m/s.  2. Left ventricular ejection fraction, by estimation, is 55 to 60%. The  left ventricle has normal function. The left ventricle has no regional  wall motion abnormalities. There is moderate left ventricular hypertrophy.  Left ventricular diastolic  parameters are consistent with Grade I diastolic dysfunction (impaired  relaxation).  3. Right ventricular systolic function is normal. The right ventricular  size is normal. Tricuspid regurgitation signal is inadequate for assessing  PA pressure.  4. The mitral valve is degenerative. Trivial mitral valve regurgitation.  Mild mitral stenosis. The mean mitral valve gradient is 2.0 mmHg with  average heart rate of 59 bpm.  5. The inferior vena cava is normal in size with greater than 50%  respiratory variability, suggesting right atrial pressure of 3 mmHg.   Comparison(s): A prior study was performed on 08/16/18. Aortic valve  stenosis is now severe.   FINDINGS  Left Ventricle: Left ventricular ejection fraction, by estimation, is 55  to 60%. The left ventricle has normal function. The left ventricle has no  regional wall motion abnormalities. The left ventricular internal cavity  size was normal in size. There is  moderate left ventricular hypertrophy. Left ventricular diastolic  parameters are consistent with Grade I diastolic dysfunction (impaired  relaxation).   Right Ventricle: The right ventricular size is normal. No increase in  right ventricular wall thickness. Right ventricular systolic function is  normal. Tricuspid regurgitation signal is inadequate for assessing PA  pressure.   Left Atrium: Left atrial size was normal in size.   Right Atrium: Right atrial size  was normal in size.   Pericardium: There is no evidence of pericardial effusion.   Mitral Valve: The mitral valve is degenerative in appearance. Mildly  decreased mobility of the mitral valve leaflets. Moderate mitral annular  calcification. Trivial mitral valve regurgitation. Mild mitral valve  stenosis. The mean mitral valve gradient is  2.0 mmHg with average heart rate of 59 bpm.   Tricuspid Valve: The tricuspid valve is normal in structure. Tricuspid  valve regurgitation is trivial. No evidence of tricuspid stenosis.   Aortic Valve: Severe aortic valve stenosis. Severely calcified aortic  valve. Dimensionless index 0.24. Maximum gradient obtained from apical  window. The aortic valve is abnormal. Aortic valve regurgitation is  trivial. There is severe calcifcation of the  aortic valve. Aortic valve mean gradient measures 40.0 mmHg. Aortic valve  peak gradient measures 67.9 mmHg. Aortic valve area, by VTI measures 0.75  cm.   Pulmonic Valve: The pulmonic valve was grossly normal. Pulmonic valve  regurgitation is trivial. No evidence of pulmonic stenosis.   Aorta: The aortic root is normal in size and structure.   Venous: The inferior vena cava is normal in size with greater than 50%  respiratory variability, suggesting right  atrial pressure of 3 mmHg.   IAS/Shunts: No atrial level shunt detected by color flow Doppler.     LEFT VENTRICLE  PLAX 2D  LVIDd:     3.70 cm Diastology  LVIDs:     2.30 cm LV e' lateral:  6.57 cm/s  LV PW:     1.70 cm LV E/e' lateral: 11.2  LV IVS:    2.10 cm LV e' medial:  4.71 cm/s  LVOT diam:   2.00 cm LV E/e' medial: 15.5  LV SV:     76  LV SV Index:  39  LVOT Area:   3.14 cm     RIGHT VENTRICLE  RV Basal diam: 2.20 cm  RV S prime:   9.49 cm/s  TAPSE (M-mode): 2.3 cm   LEFT ATRIUM       Index    RIGHT ATRIUM      Index  LA diam:    3.90 cm 1.96 cm/m RA Area:   18.50 cm  LA  Vol (A2C):  62.2 ml 31.32 ml/m RA Volume:  47.00 ml 23.67 ml/m  LA Vol (A4C):  55.9 ml 28.15 ml/m  LA Biplane Vol: 59.6 ml 30.01 ml/m  AORTIC VALVE  AV Area (Vmax):  0.72 cm  AV Area (Vmean):  0.70 cm  AV Area (VTI):   0.75 cm  AV Vmax:      412.00 cm/s  AV Vmean:     282.500 cm/s  AV VTI:      1.020 m  AV Peak Grad:   67.9 mmHg  AV Mean Grad:   40.0 mmHg  LVOT Vmax:     94.20 cm/s  LVOT Vmean:    63.300 cm/s  LVOT VTI:     0.244 m  LVOT/AV VTI ratio: 0.24    AORTA  Ao Root diam: 2.90 cm   MITRAL VALVE  MV Area (PHT): 1.66 cm   SHUNTS  MV Mean grad: 2.0 mmHg   Systemic VTI: 0.24 m  MV Decel Time: 456 msec   Systemic Diam: 2.00 cm  MV E velocity: 73.23 cm/s  MV A velocity: 131.33 cm/s  MV E/A ratio: 0.56   Cherlynn Kaiser MD  Electronically signed by Cherlynn Kaiser MD  Signature Date/Time: 09/01/2019/1:07:23 PM     Physicians  Panel Physicians Referring Physician Case Authorizing Physician  Sherren Mocha, MD (Primary)    Procedures  RIGHT/LEFT HEART CATH AND CORONARY ANGIOGRAPHY  Conclusion    Mid LAD lesion is 75% stenosed.  1st Diag lesion is 60% stenosed.  RPDA lesion is 50% stenosed.  Ost LAD to Prox LAD lesion is 30% stenosed.   1. Severe calcific aortic stenosis with mean gradient 39 mmHg 2. Severe calcific single vessel CAD involving the mid-LAD (75%) and first diagonal (60%) 3. Patent left main, LCx, and RCA, with no obstructive disease 4. Normal right heart pressures  Multidisciplinary team review of his case. Somewhat complicated in this patient with severe AS, calcific CAD, chronic oral anticoagulation for atrial fibrillation, and thrombocytopenia in the setting of multiple myeloma (plt 88k). Considerations include TAVR +/- PCI.   Recommendations  Antiplatelet/Anticoag Recommend to resume Rivaroxaban, at currently prescribed dose and frequency on 10/01/2019. Concurrent antiplatelet  therapy not recommended.  Indications  Severe aortic stenosis [I35.0 (ICD-10-CM)]  Procedural Details  Technical Details INDICATION: Severe, symptomatic aortic stenosis  PROCEDURAL DETAILS: There was an indwelling IV in a right antecubital vein. Using normal sterile technique, the IV was changed out for a 5 Fr brachial sheath  over a 0.018 inch wire. The right wrist was then prepped, draped, and anesthetized with 1% lidocaine. Using the modified Seldinger technique a 5/6 French Slender sheath was placed in the right radial artery. Intra-arterial verapamil was administered through the radial artery sheath. IV heparin was administered after a JR4 catheter was advanced into the central aorta. A Swan-Ganz catheter was used for the right heart catheterization. Standard protocol was followed for recording of right heart pressures and sampling of oxygen saturations. Fick cardiac output was calculated. Standard Judkins catheters were used for selective coronary angiography. LV pressure is recorded and an aortic valve pullback is performed. There were no immediate procedural complications. The patient was transferred to the post catheterization recovery area for further monitoring.    Estimated blood loss <50 mL.   During this procedure medications were administered to achieve and maintain moderate conscious sedation while the patient's heart rate, blood pressure, and oxygen saturation were continuously monitored and I was present face-to-face 100% of this time.  Medications (Filter: Administrations occurring from 1034 to 1117 on 09/30/19) (important) Continuous medications are totaled by the amount administered until 09/30/19 1117.  Heparin (Porcine) in NaCl 1000-0.9 UT/500ML-% SOLN (mL) Total volume:  1,000 mL Date/Time  Rate/Dose/Volume Action  09/30/19 1041  500 mL Given  1041  500 mL Given    midazolam (VERSED) injection (mg) Total dose:  1 mg Date/Time  Rate/Dose/Volume Action  09/30/19 1045   1 mg Given    fentaNYL (SUBLIMAZE) injection (mcg) Total dose:  25 mcg Date/Time  Rate/Dose/Volume Action  09/30/19 1045  25 mcg Given    lidocaine (PF) (XYLOCAINE) 1 % injection (mL) Total volume:  4 mL Date/Time  Rate/Dose/Volume Action  09/30/19 1051  2 mL Given  1053  2 mL Given    Radial Cocktail/Verapamil only (mL) Total volume:  10 mL Date/Time  Rate/Dose/Volume Action  09/30/19 1053  10 mL Given    heparin sodium (porcine) injection (Units) Total dose:  4,000 Units Date/Time  Rate/Dose/Volume Action  09/30/19 1100  4,000 Units Given    iohexol (OMNIPAQUE) 350 MG/ML injection (mL) Total volume:  70 mL Date/Time  Rate/Dose/Volume Action  09/30/19 1112  70 mL Given    Sedation Time  Sedation Time Physician-1: 24 minutes 26 seconds  Contrast  Medication Name Total Dose  iohexol (OMNIPAQUE) 350 MG/ML injection 70 mL    Radiation/Fluoro  Fluoro time: 5.5 (min) DAP: 13.7 (Gycm2) Cumulative Air Kerma: 292 (mGy)  Coronary Findings  Diagnostic Dominance: Right Left Anterior Descending  Ost LAD to Prox LAD lesion is 30% stenosed.  Mid LAD lesion is 75% stenosed. The lesion is eccentric. The lesion is severely calcified.  First Diagonal Branch  1st Diag lesion is 60% stenosed.  Left Circumflex  The vessel exhibits minimal luminal irregularities.  Right Coronary Artery  Vessel is large. There is mild diffuse disease throughout the vessel.  Right Posterior Descending Artery  RPDA lesion is 50% stenosed.  Intervention  No interventions have been documented. Right Heart  Right Heart Pressures Normal right heart pressures  Left Heart  Aortic Valve There is severe aortic valve stenosis. The aortic valve is calcified. There is restricted aortic valve motion. Mean gradient 39 mmHg, Peak-to-peak gradient 48 mmHg, peak instantaneous gradient 55 mmHg. Calculated AVA 1.5 square cm (high CO 8.0 L/min)  Coronary Diagrams  Diagnostic Dominance:  Right  Intervention  Implants   No implant documentation for this case.  Syngo Images  Show images for CARDIAC CATHETERIZATION Images on Long  Term Storage  Show images for Rollyn, Scialdone "[name]" Link to Procedure Log  Procedure Log    Hemo Data   Most Recent Value  Fick Cardiac Output 8.06 L/min  Fick Cardiac Output Index 4.08 (L/min)/BSA  Aortic Mean Gradient 38.64 mmHg  Aortic Peak Gradient 48 mmHg  Aortic Valve Area 1.51  Aortic Value Area Index 0.77 cm2/BSA  RA A Wave 1 mmHg  RA V Wave 0 mmHg  RA Mean 0 mmHg  RV Systolic Pressure 23 mmHg  RV Diastolic Pressure -1 mmHg  RV EDP 2 mmHg  PA Systolic Pressure 23 mmHg  PA Diastolic Pressure 2 mmHg  PA Mean 11 mmHg  AO Systolic Pressure 403 mmHg  AO Diastolic Pressure 45 mmHg  AO Mean 67 mmHg  LV Systolic Pressure 474 mmHg  LV Diastolic Pressure 0 mmHg  LV EDP 5 mmHg  AOp Systolic Pressure 259 mmHg  AOp Diastolic Pressure 44 mmHg  AOp Mean Pressure 67 mmHg  LVp Systolic Pressure 563 mmHg  LVp Diastolic Pressure 0 mmHg  LVp EDP Pressure 6 mmHg  QP/QS 1  TPVR Index 2.69 HRUI  TSVR Index 16.4 HRUI  TPVR/TSVR Ratio 0.16    ADDENDUM REPORT: 09/13/2019 16:22  ADDENDUM: Extracardiac findings will be described separately under dictation for contemporaneously obtained CTA chest, abdomen and pelvis dated 09/13/2019. Please see that dictation for full description.   Electronically Signed   By: Vinnie Langton M.D.   On: 09/13/2019 16:22   Addended by Etheleen Mayhew, MD on 09/13/2019 4:24 PM  Study Result  Addenda  ADDENDUM REPORT: 09/13/2019 16:22  ADDENDUM: Extracardiac findings will be described separately under dictation for contemporaneously obtained CTA chest, abdomen and pelvis dated 09/13/2019. Please see that dictation for full description.   Electronically Signed   By: Vinnie Langton M.D.   On: 09/13/2019 16:22   Signed by Etheleen Mayhew, MD on 09/13/2019 4:24 PM   Narrative & Impression  CLINICAL DATA:  Aortic Stenosis  EXAM: Cardiac TAVR CT  TECHNIQUE: The patient was scanned on a Siemens Force 875 slice scanner. A 120 kV retrospective scan was triggered in the ascending thoracic aorta at 140 HU's. Gantry rotation speed was 250 msecs and collimation was .6 mm. No beta blockade or nitro were given. The 3D data set was reconstructed in 5% intervals of the R-R cycle. Systolic and diastolic phases were analyzed on a dedicated work station using MPR, MIP and VRT modes. The patient received 80 cc of contrast.  FINDINGS: Aortic Valve: Tri leaflet calcified with restricted leaflet motion Aortic valve calcium score 3904  Aorta: No aneurysm normal arch vessels moderate calcific atherosclerosis  Sino-tubular Junction: 28 mm  Ascending Thoracic Aorta: 33 mm  Aortic Arch: 28 mm  Descending Thoracic Aorta: 25 mm  Sinus of Valsalva Measurements:  Non-coronary: 31.5 mm  Right - coronary: 31.4 mm  Left -   coronary: 32.9 mm  Coronary Artery Height above Annulus:  Left Main: Shallow at 10 mm  Right Coronary: 15 mm above annulus  Virtual Basal Annulus Measurements:  Maximum / Minimum Diameter: 31.2 mm x 24 mm  Perimeter: 89.7 mm c  Area: 595 mm 2 c  Optimum Fluoroscopic Angle for Delivery: RAO 13 Caudal 29  IMPRESSION: 1. Tri leaflet calcified AV with score 3904 consistent with severe AS  2.  Shallow LM coronary artery 10 mm above aortic annulus  3.  Annular area of 595 mm2 suitable for a 29 mm Sapien 3 valve  4. Optimum  angiographic angle for deployment RAO 13 Caudal 29 degrees  5.  Normal aortic root 3.3 cm  6.  No LAA thrombus  Jenkins Rouge  Electronically Signed: By: Jenkins Rouge M.D. On: 09/13/2019 14:15    Narrative & Impression  CLINICAL DATA:  85 year old male with history of severe aortic stenosis. Preprocedural study prior to potential transcatheter aortic valve replacement  (TAVR) procedure. Additional history of multiple myeloma.  EXAM: CT ANGIOGRAPHY CHEST, ABDOMEN AND PELVIS  TECHNIQUE: Multidetector CT imaging through the chest, abdomen and pelvis was performed using the standard protocol during bolus administration of intravenous contrast. Multiplanar reconstructed images and MIPs were obtained and reviewed to evaluate the vascular anatomy.  CONTRAST:  174m OMNIPAQUE IOHEXOL 350 MG/ML SOLN  COMPARISON:  Chest CT 07/23/2016.  FINDINGS: CTA CHEST FINDINGS  Cardiovascular: Heart size is normal. There is no significant pericardial fluid, thickening or pericardial calcification. There is aortic atherosclerosis, as well as atherosclerosis of the great vessels of the mediastinum and the coronary arteries, including calcified atherosclerotic plaque in the left anterior descending and right coronary arteries. Severe thickening calcification of the aortic valve. Calcifications of the mitral annulus.  Mediastinum/Lymph Nodes: No pathologically enlarged mediastinal or hilar lymph nodes. Esophagus is unremarkable in appearance. No axillary lymphadenopathy.  Lungs/Pleura: No suspicious appearing pulmonary nodules or masses are noted. No acute consolidative airspace disease. Trace right pleural effusion lying dependently. Small amount of pleural enhancement in the base of the right hemithorax best appreciated on axial image 84 of series 15.  Musculoskeletal/Soft Tissues: Pole multiple osseous lesions, ranging from lytic to sclerotic. The largest of these is in the right-side of T10 vertebral body (axial image 81 of series 16) measuring 2.9 x 1.7 cm, the with a mixed lytic and sclerotic appearance immediately adjacent to a soft tissue mass in the paravertebral region of the medial right hemithorax which appears to be subpleural which measures 1.6 x 1.8 cm (axial image 82 of series 15).  CTA ABDOMEN AND PELVIS FINDINGS  Hepatobiliary: No  suspicious cystic or solid hepatic lesions. No intra or extrahepatic biliary ductal dilatation. Gallbladder is normal in appearance.  Pancreas: No pancreatic mass. No pancreatic ductal dilatation. No pancreatic or peripancreatic fluid collections or inflammatory changes.  Spleen: Unremarkable.  Adrenals/Urinary Tract: In the lower pole the left kidney there is a 2.6 x 2.1 cm low-attenuation lesion compatible with a simple cyst. Right kidney and bilateral adrenal glands are normal in appearance. No hydroureteronephrosis. Urinary bladder appears partially decompressed, the demonstrates thickened and trabeculated wall with multiple small diverticuli, largest of which is posteriorly measuring 1.6 cm.  Stomach/Bowel: Normal appearance of the stomach. No pathologic dilatation of small bowel or colon. Normal appendix.  Vascular/Lymphatic: Aortic atherosclerosis, without evidence of aneurysm or dissection in the abdominal or pelvic vasculature. Vascular findings and measurements pertinent to potential TAVR procedure, as detailed below. No lymphadenopathy noted in the abdomen or pelvis.  Reproductive: Brachytherapy implants throughout the prostate gland.  Other: No significant volume of ascites.  No pneumoperitoneum.  Musculoskeletal: Multiple osseous lesions which range from lytic to sclerotic, most notable involving the L5 vertebral body where there are pathologic compression fractures of both the superior and inferior endplates with 912%loss of central vertebral body height.  VASCULAR MEASUREMENTS PERTINENT TO TAVR:  AORTA:  Minimal Aortic Diameter-18 x 16 mm  Severity of Aortic Calcification-mild-to-moderate  RIGHT PELVIS:  Right Common Iliac Artery -  Minimal Diameter-9.9 x 9.4 mm  Tortuosity-mild  Calcification-moderate  Right External Iliac Artery -  Minimal  Diameter-10.2 x 10.7 mm  Tortuosity-severe  Calcification-mild  Right Common  Femoral Artery -  Minimal Diameter-8.7 x 9.5 mm  Tortuosity-mild  Calcification-mild-to-moderate  LEFT PELVIS:  Left Common Iliac Artery -  Minimal Diameter-10.1 x 10.0 mm  Tortuosity-mild  Calcification-moderate  Left External Iliac Artery -  Minimal Diameter-9.3 x 7.6 mm  Tortuosity-moderate  Calcification-mild  Left Common Femoral Artery -  Minimal Diameter-10.7 x 8.9 mm  Tortuosity-mild  Calcification-mild  Review of the MIP images confirms the above findings.  IMPRESSION: 1. Vascular findings and measurements pertinent to potential TAVR procedure, as detailed above. 2. Severe thickening and calcification of the aortic valve, compatible with reported clinical history of severe aortic stenosis. 3. Widespread osseous lesions compatible with reported clinical history of multiple myeloma. Additional enhancing soft tissue paravertebral lesion in the lower right hemithorax and small amount of pleural enhancement in the lower right hemithorax also noted. 4. Aortic atherosclerosis, in addition to two vessel coronary artery disease. 5. Additional incidental findings, as above.   Electronically Signed   By: Vinnie Langton M.D.   On: 09/13/2019 17:58    Procedure: AVR + CAB   Risk of Mortality: 2.063%  Renal Failure: 1.363%  Permanent Stroke: 1.723%  Prolonged Ventilation: 4.055%  DSW Infection: 0.249%  Reoperation: 3.440%  Morbidity or Mortality: 7.625%  Short Length of Stay: 24.645%  Long Length of Stay: 9.544%   Impression:  This 85 year old gentleman has stage D, severe, symptomatic aortic stenosis with CCS class II symptoms of exertional angina with walking up hills as well as exertional fatigue. He is NYHA class l-ll with no shortness of breath, mild fatigue only with heavy exertion.  I have personally reviewed his 2D echocardiogram, cardiac catheterization, and CTA studies. His echocardiogram shows a trileaflet aortic valve  with severe calcification and restricted leaflet mobility. The mean gradient has increased over the last few years to 40 mmHg consistent with severe aortic stenosis. Left ventricular systolic function is normal with moderate LVH. His cardiac catheterization shows a mean gradient of 39 mmHg across the aortic valve. Coronary angiography shows a 75% calcified mid LAD stenosis as well as 60% first diagonal stenosis. This LAD lesion is probably hemodynamically significant but I think his severe aortic stenosis is more likely to be contributing to his symptoms. This lesion would require atherectomy and stenting over a long area in a patient with severe aortic stenosis and I think it is probably best to continue medical therapy for this coronary lesion and replace his aortic valve. PCI and stenting in this patient would be complicated by the use of chronic oral anticoagulation for atrial fibrillation as well as thrombocytopenia in the setting of multiple myeloma and chronic chemotherapy for that. I think TAVR would be the best option for this patient given his advanced age and comorbidities including multiple myeloma. His gated cardiac CTA shows anatomy suitable for transcatheter aortic valve replacement using a SAPIEN 3 valve. His abdominal and pelvic CTA shows adequate pelvic vascular anatomy to allow transfemoral insertion.  The patient and his wife were counseled at length regarding treatment alternatives for management of severe symptomatic aortic stenosis. The risks and benefits of surgical intervention has been discussed in detail. Long-term prognosis with medical therapy was discussed. Alternative approaches such as conventional surgical aortic valve replacement, transcatheter aortic valve replacement, and palliative medical therapy were compared and contrasted at length. This discussion was placed in the context of the patient's own specific clinical presentation and past medical history. All of their questions  have been addressed.   Following the decision to proceed with transcatheter aortic valve replacement, a discussion was held regarding what types of management strategies would be attempted intraoperatively in the event of life-threatening complications, including whether or not the patient would be considered a candidate for the use of cardiopulmonary bypass and/or conversion to open sternotomy for attempted surgical intervention. The patient is aware of the fact that transient use of cardiopulmonary bypass may be necessary. He is 85 years old but still very active and I think he would be a candidate for emergent sternotomy if needed to manage any intraoperative complications. The patient has been advised of a variety of complications that might develop including but not limited to risks of death, stroke, paravalvular leak, aortic dissection or other major vascular complications, aortic annulus rupture, device embolization, cardiac rupture or perforation, mitral regurgitation, acute myocardial infarction, arrhythmia, heart block or bradycardia requiring permanent pacemaker placement, congestive heart failure, respiratory failure, renal failure, pneumonia, infection, other late complications related to structural valve deterioration or migration, or other complications that might ultimately cause a temporary or permanent loss of functional independence or other long term morbidity. The patient provides full informed consent for the procedure as described and all questions were answered.    Plan:  He will be scheduled for transfemoral transcatheter aortic valve replacement using a SAPIEN 3 valve. He will need to discontinue his Xarelto 5 days preoperatively. He is scheduled for another dose of chemotherapy on 10/19/2019 and the timing of his procedure will need to be coordinated with that through Dr. Gearldine Shown office.  I spent 60 minutes performing this consultation and > 50% of this time was spent face to  face counseling and coordinating the care of this patient's severe symptomatic aortic stenosis.   [name], MD 10/05/2019 10:52 AM

## 2019-10-05 NOTE — Patient Instructions (Signed)
[facility] Discharge Instructions for Patients Receiving Chemotherapy  Today you received the following chemotherapy agents: carfilzomib (kyprolis).  To help prevent nausea and vomiting after your treatment, we encourage you to take your nausea medication as directed.   If you develop nausea and vomiting that is not controlled by your nausea medication, call the clinic.   BELOW ARE SYMPTOMS THAT SHOULD BE REPORTED IMMEDIATELY:  *FEVER GREATER THAN 100.5 F  *CHILLS WITH OR WITHOUT FEVER  NAUSEA AND VOMITING THAT IS NOT CONTROLLED WITH YOUR NAUSEA MEDICATION  *UNUSUAL SHORTNESS OF BREATH  *UNUSUAL BRUISING OR BLEEDING  TENDERNESS IN MOUTH AND THROAT WITH OR WITHOUT PRESENCE OF ULCERS  *URINARY PROBLEMS  *BOWEL PROBLEMS  UNUSUAL RASH Items with * indicate a potential emergency and should be followed up as soon as possible.  Feel free to call the clinic should you have any questions or concerns. The clinic phone number is [phone].  Please show the [place] at check-in to the Emergency Department and triage nurse.

## 2019-10-05 NOTE — Progress Notes (Addendum)
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  He completed a cycle of daratumumab/carfilzomib 09/21/2019.  Nausea/vomiting.  Bowel habits continue alternating constipation and diarrhea.  He denies shortness of breath.  He notes chest tightness with exertion.  He saw Dr. Cyndia Bent this morning.  Objective:  Vital signs in last 24 hours:  Blood pressure 116/69, pulse 62, resp. rate 18, height _0  (1.803 m), weight 173 lb 6.4 oz (78.7 kg), SpO2 99 %.    HEENT: No thrush or ulcers. Resp: Lungs clear bilaterally. Cardio: Regular rate and rhythm.  2/6 systolic murmur. GI: Abdomen soft and nontender.  No hepatosplenomegaly. Vascular: Trace edema at the lower legs bilaterally.   Lab Results:  Lab Results  Component Value Date   WBC 4.1 10/05/2019   HGB 13.8 10/05/2019   HCT 42.0 10/05/2019   MCV 98.1 10/05/2019   PLT 92 (L) 10/05/2019   NEUTROABS 3.2 10/05/2019    Imaging:  No results found.  Medications: I have reviewed the patient's current medications.  Assessment/Plan: 1. Multiple myeloma-confirmed on a bone marrow biopsy 08/07/2014, IgA lambda  Serum M spike and increased serum free lambda light chains  Myeloma FISH panel negative for chromosome 4, 11, 12, 13, 14, and 17 abnormalities, cytogenetics with no metaphases  Bone survey 08/15/2014 with indeterminant lucent skull lesions  Cycle 1 RVD 08/22/2014  Cycle 2 RVD 09/19/2014  Cycle 3 RVD 10/17/2014  Cycle 4 RVD 11/14/2014 (Decadron reduced to 20 mg weekly, Revlimid 15 mg days 1 through 14, Velcade 3/4 weeks)  Cycle 5 RVD 12/12/2014  Serum M spike not detected 12/26/2014  Cycle 6 RVD 01/09/2015  Maintenance Revlimid, 10 mg daily, 02/05/2015 , discontinue 02/26/2015 secondary to leg edema and diarrhea  Revlimid resumed at a dose of 10 mg every other day beginning 03/13/2015  PET scan 32/99/2426-ST hypermetabolic  bone lesions, few lucent lesions in the spine and pelvis  Revlimid discontinued January 2019  Enrollment on a clinical trial at [facility] with pomalidomide/Decadron+/- ixazomibbeginning 03/10/2017  Treatment discontinued February 2021 secondary to rising serum lambda light chains  PET 04/05/2019-hypermetabolic right axillary/chest wall nodes, right paravertebral mass, small right pleural effusion, retrocrural node, right lateral abdominal wall mass, L5 lesion mild compression deformity. Focus of hypermetabolism at the anterior left pelvic wall without a CT correlate. Hypermetabolic lytic lesion in the left T3 transverse process  Cycle 1 daratumumab/carfilzomib/Decadron 04/05/2019  Radiation to the right chest wall mass and lumbar spine beginning 04/25/2019  Cycle 2 daratumumab/carfilzomib/Decadron 05/04/2019 (carfilzomib dose escalated); day 8 held due to neutropenia, thrombocytopenia; day 15 05/18/2019 (carfilzomib dose reduced)  Daratumumab/carfilzomib/Decadron changed to every 2-week dosing beginning 05/25/2019  Daratumumab changed to a monthly schedule beginning 09/21/2019; carfilzomib every 2 weeks  2. 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 History of intermittent prostatitis  4. Skin rash 10/24/2014-referred to dermatology, biopsy consistent with local hypersensitivity reaction  5. 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 Left lower leg cellulitis 06/24/2015-treated with Keflex  7. History of mild thrombocytopenia secondary tomyeloma and systemic therapy  8. History of mild neutropenia-secondary to myeloma and systemic therapy  9. Fall with a skull fracture and subarachnoid blood/right frontal lobe  contusions 10/17/2015  10. Right lower lobe pulmonary embolus 03/28/2016. Lovenox ,  transitioned to Xarelto  11. Vesicular rashJune 2018-potentially related to the zoster vaccine, resolved  12. History of atrial fibrillation  13. Aortic stenosis  14. 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  Plan to continue monthly daratumumab, every 2-week carfilzomib.  He is scheduled for carfilzomib today.  We will follow-up on the myeloma panel from today.  He is considering undergoing the TAVR procedure.  He saw Dr. Cyndia Bent earlier today.  He will return for lab, follow-up, daratumumab/carfilzomib in 2 weeks.  He will contact the office in the interim with any problems.  Patient seen with [name].      Ned Card ANP/GNP-BC   10/05/2019  12:01 PM This was a shared visit with Ned Card. 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 He continues to tolerate the daratumumab/Decadron/carfilzomib well. He is in clinical remission for myeloma. He plans to proceed with the TAVR procedure. We will follow up on the myeloma panel from today.  Julieanne Manson, MD

## 2019-10-05 NOTE — Progress Notes (Signed)
Per Ned Card, ok to treat with platelets of 92

## 2019-10-06 ENCOUNTER — Telehealth: Payer: Self-pay | Admitting: Nurse Practitioner

## 2019-10-06 DIAGNOSIS — D6869 Other thrombophilia: Secondary | ICD-10-CM | POA: Diagnosis not present

## 2019-10-06 DIAGNOSIS — I4891 Unspecified atrial fibrillation: Secondary | ICD-10-CM | POA: Diagnosis not present

## 2019-10-06 DIAGNOSIS — I10 Essential (primary) hypertension: Secondary | ICD-10-CM | POA: Diagnosis not present

## 2019-10-06 DIAGNOSIS — C9000 Multiple myeloma not having achieved remission: Secondary | ICD-10-CM | POA: Diagnosis not present

## 2019-10-06 DIAGNOSIS — D61818 Other pancytopenia: Secondary | ICD-10-CM | POA: Diagnosis not present

## 2019-10-06 DIAGNOSIS — L03116 Cellulitis of left lower limb: Secondary | ICD-10-CM | POA: Diagnosis not present

## 2019-10-06 DIAGNOSIS — Z23 Encounter for immunization: Secondary | ICD-10-CM | POA: Diagnosis not present

## 2019-10-06 DIAGNOSIS — I350 Nonrheumatic aortic (valve) stenosis: Secondary | ICD-10-CM | POA: Diagnosis not present

## 2019-10-06 LAB — IGA: IgA: <5 mg/dL — ABNORMAL LOW (ref 61–437)

## 2019-10-06 LAB — KAPPA/LAMBDA LIGHT CHAINS
Kappa free light chain: 1.5 mg/L — ABNORMAL LOW (ref 3.3–19.4)
Kappa, lambda light chain ratio: 0.24 — ABNORMAL LOW (ref 0.26–1.65)
Lambda free light chains: 6.2 mg/L (ref 5.7–26.3)

## 2019-10-06 NOTE — Telephone Encounter (Signed)
Scheduled appointments per 9/8 los. Patient is aware of appointments date and times.  

## 2019-10-10 ENCOUNTER — Telehealth: Payer: Self-pay | Admitting: Cardiovascular Disease

## 2019-10-10 NOTE — Telephone Encounter (Signed)
Patient's son Mitchell Lowery was returning Dr. Antionette Char call from last week.

## 2019-10-10 NOTE — Telephone Encounter (Signed)
Discussed plan with his son. All questions answered.

## 2019-10-13 NOTE — Progress Notes (Signed)
Per LT on 9/15 she got this message from MD  "He is potentially undergoing TAVR on 10/25/2019-this is the message [name] sent to me--"we can hold the carfilzomib 9/22, can give daratumumab if he is due 9/22, will need pre-op cbc"--- based on the monthly dosing schedule he is now on for daratumumab he would be due 10/19/2019 (last dose 09/21/2019 per synopsis)"  The carf was dc from 9/22 treatment day per her confirmation and dara was added as a sole treatment on 9/22.  MD will discuss TAVR when they meet on 9/22

## 2019-10-14 ENCOUNTER — Other Ambulatory Visit: Payer: Self-pay

## 2019-10-14 DIAGNOSIS — I350 Nonrheumatic aortic (valve) stenosis: Secondary | ICD-10-CM

## 2019-10-15 ENCOUNTER — Other Ambulatory Visit: Payer: Self-pay | Admitting: Oncology

## 2019-10-19 ENCOUNTER — Inpatient Hospital Stay: Payer: Medicare PPO

## 2019-10-19 ENCOUNTER — Other Ambulatory Visit: Payer: Self-pay

## 2019-10-19 ENCOUNTER — Inpatient Hospital Stay (HOSPITAL_BASED_OUTPATIENT_CLINIC_OR_DEPARTMENT_OTHER): Payer: Medicare PPO | Admitting: Oncology

## 2019-10-19 VITALS — BP 119/65 | HR 68 | Temp 97.9°F | Resp 18 | Ht 71.0 in | Wt 174.4 lb

## 2019-10-19 DIAGNOSIS — C9000 Multiple myeloma not having achieved remission: Secondary | ICD-10-CM

## 2019-10-19 DIAGNOSIS — Z5111 Encounter for antineoplastic chemotherapy: Secondary | ICD-10-CM | POA: Diagnosis not present

## 2019-10-19 DIAGNOSIS — Z79899 Other long term (current) drug therapy: Secondary | ICD-10-CM | POA: Diagnosis not present

## 2019-10-19 LAB — CBC WITH DIFFERENTIAL (CANCER CENTER ONLY)
Abs Immature Granulocytes: 0.01 10*3/uL (ref 0.00–0.07)
Basophils Absolute: 0 10*3/uL (ref 0.0–0.1)
Basophils Relative: 1 %
Eosinophils Absolute: 0 10*3/uL (ref 0.0–0.5)
Eosinophils Relative: 1 %
HCT: 38.8 % — ABNORMAL LOW (ref 39.0–52.0)
Hemoglobin: 13.2 g/dL (ref 13.0–17.0)
Immature Granulocytes: 0 %
Lymphocytes Relative: 12 %
Lymphs Abs: 0.4 10*3/uL — ABNORMAL LOW (ref 0.7–4.0)
MCH: 32.3 pg (ref 26.0–34.0)
MCHC: 34 g/dL (ref 30.0–36.0)
MCV: 94.9 fL (ref 80.0–100.0)
Monocytes Absolute: 0.4 10*3/uL (ref 0.1–1.0)
Monocytes Relative: 11 %
Neutro Abs: 2.7 10*3/uL (ref 1.7–7.7)
Neutrophils Relative %: 75 %
Platelet Count: 89 10*3/uL — ABNORMAL LOW (ref 150–400)
RBC: 4.09 MIL/uL — ABNORMAL LOW (ref 4.22–5.81)
RDW: 12.7 % (ref 11.5–15.5)
WBC Count: 3.6 10*3/uL — ABNORMAL LOW (ref 4.0–10.5)
nRBC: 0 % (ref 0.0–0.2)

## 2019-10-19 LAB — CMP (CANCER CENTER ONLY)
ALT: 13 U/L (ref 0–44)
AST: 14 U/L — ABNORMAL LOW (ref 15–41)
Albumin: 3.3 g/dL — ABNORMAL LOW (ref 3.5–5.0)
Alkaline Phosphatase: 38 U/L (ref 38–126)
Anion gap: 4 — ABNORMAL LOW (ref 5–15)
BUN: 10 mg/dL (ref 8–23)
CO2: 26 mmol/L (ref 22–32)
Calcium: 8.8 mg/dL — ABNORMAL LOW (ref 8.9–10.3)
Chloride: 110 mmol/L (ref 98–111)
Creatinine: 0.72 mg/dL (ref 0.61–1.24)
GFR, Est AFR Am: >60 mL/min (ref >60)
GFR, Estimated: >60 mL/min (ref >60)
Glucose, Bld: 106 mg/dL — ABNORMAL HIGH (ref 70–99)
Potassium: 3.6 mmol/L (ref 3.5–5.1)
Sodium: 140 mmol/L (ref 135–145)
Total Bilirubin: 0.8 mg/dL (ref 0.3–1.2)
Total Protein: 5.6 g/dL — ABNORMAL LOW (ref 6.5–8.1)

## 2019-10-19 MED ORDER — DEXAMETHASONE 4 MG PO TABS
ORAL_TABLET | ORAL | Status: AC
  Filled 2019-10-19: qty 5

## 2019-10-19 MED ORDER — DIPHENHYDRAMINE HCL 25 MG PO CAPS
50.0000 mg | ORAL_CAPSULE | Freq: Once | ORAL | Status: AC
  Administered 2019-10-19: 50 mg via ORAL

## 2019-10-19 MED ORDER — DIPHENHYDRAMINE HCL 25 MG PO CAPS
ORAL_CAPSULE | ORAL | Status: AC
  Filled 2019-10-19: qty 2

## 2019-10-19 MED ORDER — ACETAMINOPHEN 325 MG PO TABS
650.0000 mg | ORAL_TABLET | Freq: Once | ORAL | Status: AC
  Administered 2019-10-19: 650 mg via ORAL

## 2019-10-19 MED ORDER — ACETAMINOPHEN 325 MG PO TABS
ORAL_TABLET | ORAL | Status: AC
  Filled 2019-10-19: qty 2

## 2019-10-19 MED ORDER — DEXAMETHASONE 4 MG PO TABS
20.0000 mg | ORAL_TABLET | Freq: Once | ORAL | Status: AC
  Administered 2019-10-19: 20 mg via ORAL

## 2019-10-19 MED ORDER — MONTELUKAST SODIUM 10 MG PO TABS
ORAL_TABLET | ORAL | Status: AC
  Filled 2019-10-19: qty 1

## 2019-10-19 MED ORDER — DARATUMUMAB-HYALURONIDASE-FIHJ 1800-30000 MG-UT/15ML ~~LOC~~ SOLN
1800.0000 mg | Freq: Once | SUBCUTANEOUS | Status: AC
  Administered 2019-10-19: 1800 mg via SUBCUTANEOUS
  Filled 2019-10-19: qty 15

## 2019-10-19 MED ORDER — MONTELUKAST SODIUM 10 MG PO TABS
10.0000 mg | ORAL_TABLET | Freq: Once | ORAL | Status: AC
  Administered 2019-10-19: 10 mg via ORAL

## 2019-10-19 NOTE — Patient Instructions (Signed)
[facility] Discharge Instructions for Patients Receiving Chemotherapy  Today you received the following chemotherapy agents: Darzalex faspro injection  To help prevent nausea and vomiting after your treatment, we encourage you to take your nausea medication as directed.   If you develop nausea and vomiting that is not controlled by your nausea medication, call the clinic.   BELOW ARE SYMPTOMS THAT SHOULD BE REPORTED IMMEDIATELY:  *FEVER GREATER THAN 100.5 F  *CHILLS WITH OR WITHOUT FEVER  NAUSEA AND VOMITING THAT IS NOT CONTROLLED WITH YOUR NAUSEA MEDICATION  *UNUSUAL SHORTNESS OF BREATH  *UNUSUAL BRUISING OR BLEEDING  TENDERNESS IN MOUTH AND THROAT WITH OR WITHOUT PRESENCE OF ULCERS  *URINARY PROBLEMS  *BOWEL PROBLEMS  UNUSUAL RASH Items with * indicate a potential emergency and should be followed up as soon as possible.  Feel free to call the clinic should you have any questions or concerns. The clinic phone number is [phone].  Please show the [place] at check-in to the Emergency Department and triage nurse.

## 2019-10-19 NOTE — Progress Notes (Signed)
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 He was treated with carfilzomib on 10/05/2019. He is scheduled for a TAVR procedure 10/25/2019.  He continues to have exertional dyspnea and chest tightness.  No chest wall or back pain.  No other complaint.  The lower leg lesion is healing. Objective:  Vital signs in last 24 hours:  Blood pressure 119/65, pulse 68, temperature 97.9 F (36.6 C), temperature source Tympanic, resp. rate 18, height '5\' 11"'  (1.803 m), weight 174 lb 6.4 oz (79.1 kg), SpO2 98 %.    Resp: Lungs clear bilaterally Cardio: Regular rate and rhythm, 2/6 systolic murmur Vascular: Trace pitting edema at the left greater than right lower leg  Skin: Healing superficial 2-3 cm ulceration at the lateral left lower leg    Lab Results:  Lab Results  Component Value Date   WBC 3.6 (L) 10/19/2019   HGB 13.2 10/19/2019   HCT 38.8 (L) 10/19/2019   MCV 94.9 10/19/2019   PLT 89 (L) 10/19/2019   NEUTROABS 2.7 10/19/2019    CMP  Lab Results  Component Value Date   NA 140 10/19/2019   K 3.6 10/19/2019   CL 110 10/19/2019   CO2 26 10/19/2019   GLUCOSE 106 (H) 10/19/2019   BUN 10 10/19/2019   CREATININE 0.72 10/19/2019   CALCIUM 8.8 (L) 10/19/2019   PROT 5.6 (L) 10/19/2019   ALBUMIN 3.3 (L) 10/19/2019   AST 14 (L) 10/19/2019   ALT 13 10/19/2019   ALKPHOS 38 10/19/2019   BILITOT 0.8 10/19/2019   GFRNONAA >60 10/19/2019   GFRAA >60 10/19/2019    Medications: I have reviewed the patient's current medications.   Assessment/Plan:  1. Multiple myeloma-confirmed on a bone marrow biopsy 08/07/2014, IgA lambda  Serum M spike and increased serum free lambda light chains  Myeloma FISH panel negative for chromosome 4, 11, 12, 13, 14, and 17 abnormalities, cytogenetics with no metaphases  Bone survey 08/15/2014 with indeterminant lucent skull lesions  Cycle 1 RVD  08/22/2014  Cycle 2 RVD 09/19/2014  Cycle 3 RVD 10/17/2014  Cycle 4 RVD 11/14/2014 (Decadron reduced to 20 mg weekly, Revlimid 15 mg days 1 through 14, Velcade 3/4 weeks)  Cycle 5 RVD 12/12/2014  Serum M spike not detected 12/26/2014  Cycle 6 RVD 01/09/2015  Maintenance Revlimid, 10 mg daily, 02/05/2015 , discontinue 02/26/2015 secondary to leg edema and diarrhea  Revlimid resumed at a dose of 10 mg every other day beginning 03/13/2015  PET scan 12/45/8099-IP hypermetabolic bone lesions, few lucent lesions in the spine and pelvis  Revlimid discontinued January 2019  Enrollment on a clinical trial at [facility] with pomalidomide/Decadron+/- ixazomibbeginning 03/10/2017  Treatment discontinued February 2021 secondary to rising serum lambda light chains  PET 04/05/2019-hypermetabolic right axillary/chest wall nodes, right paravertebral mass, small right pleural effusion, retrocrural node, right lateral abdominal wall mass, L5 lesion mild compression deformity. Focus of hypermetabolism at the anterior left pelvic wall without a CT correlate. Hypermetabolic lytic lesion in the left T3 transverse process  Cycle 1 daratumumab/carfilzomib/Decadron 04/05/2019  Radiation to the right chest wall mass and lumbar spine beginning 04/25/2019  Cycle 2 daratumumab/carfilzomib/Decadron 05/04/2019 (carfilzomib dose escalated); day 8 held due to neutropenia, thrombocytopenia; day 15 05/18/2019 (carfilzomib dose reduced)  Daratumumab/carfilzomib/Decadron changed to every 2-week dosing beginning 05/25/2019  Daratumumab changed to a monthly schedule beginning 09/21/2019; carfilzomib every 2 weeks  2. 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 History of intermittent prostatitis  4. Skin rash 10/24/2014-referred to dermatology, biopsy  consistent with local hypersensitivity reaction  5. 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 Left lower leg cellulitis 06/24/2015-treated with Keflex  7. History of mild thrombocytopenia secondary tomyeloma and systemic therapy  8. History of mild neutropenia-secondary to myeloma and systemic therapy  9. Fall with a skull fracture and subarachnoid blood/right frontal lobe contusions 10/17/2015  10. Right lower lobe pulmonary embolus 03/28/2016. Lovenox , transitioned to Xarelto  11. Vesicular rashJune 2018-potentially related to the zoster vaccine, resolved  12. History of atrial fibrillation  13. Aortic stenosis-scheduled for a TAVR 10/25/2019  14. 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  He continues to tolerate the daratumumab and carfilzomib well.  He is in clinical remission from multiple myeloma.  He has stable mild thrombocytopenia.  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  Carfilzomib will be held today.  He will receive daratumumab today.  He will return for an office visit in 2 weeks.  Mitchell Coder, MD  10/19/2019  10:19 AM

## 2019-10-20 ENCOUNTER — Other Ambulatory Visit: Payer: Self-pay

## 2019-10-20 DIAGNOSIS — I350 Nonrheumatic aortic (valve) stenosis: Secondary | ICD-10-CM

## 2019-10-20 NOTE — Progress Notes (Signed)
[place], [place] [address] Phone: [phone] Fax: [phone]      Your procedure is scheduled on Tuesday, 10/25/2019.  Report to [facility] Main Entrance "A" at 09:51 A.M., and check in at the Admitting office.  Call this number if you have problems or questions between now and the morning of surgery:  [phone]    Remember:  Do not eat or drink after midnight the night before your surgery   Stop taking Xarelto on 9/23. You will take your last dose on 9/22 (Wednesday).    Continue taking all other medications without change through the day before surgery.     On the morning of surgery do not take any medications.                      Do not wear jewelry            Do not wear lotions, powders, colognes, or deodorant.            Men may shave face and neck.            Do not bring valuables to the hospital.            [facility] is not responsible for any belongings or valuables.  Do NOT Smoke (Tobacco/Vaping) or drink Alcohol 24 hours prior to your procedure  If you use a CPAP at night, you may bring all equipment for your overnight stay.   Contacts, glasses, dentures or bridgework may not be worn into surgery.      For patients admitted to the hospital, discharge time will be determined by your treatment team.   Patients discharged the day of surgery will not be allowed to drive home, and someone needs to stay with them for 24 hours.    Special instructions:   [place]- Preparing For Surgery  Before surgery, you can play an important role. Because skin is not sterile, your skin needs to be as free of germs as possible. You can reduce the number of germs on your skin by washing with CHG (chlorahexidine gluconate) Soap before surgery.  CHG is an antiseptic cleaner which kills germs and bonds with the skin to continue killing germs even after washing.    Oral Hygiene is also  important to reduce your risk of infection.  Remember - BRUSH YOUR TEETH THE MORNING OF SURGERY WITH YOUR REGULAR TOOTHPASTE  Please do not use if you have an allergy to CHG or antibacterial soaps. If your skin becomes reddened/irritated stop using the CHG.  Do not shave (including legs and underarms) for at least 48 hours prior to first CHG shower. It is OK to shave your face.  Please follow these instructions carefully.   1. Shower the NIGHT BEFORE SURGERY and the MORNING OF SURGERY with CHG Soap.   2. If you chose to wash your hair, wash your hair first as usual with your normal shampoo.  3. After you shampoo, rinse your hair and body thoroughly to remove the shampoo.  4. Use CHG as you would any other liquid soap. You can apply CHG directly to the skin and wash gently with a scrungie or a clean washcloth.   5. Apply the CHG Soap to your body ONLY FROM THE NECK DOWN.  Do not use on open wounds or open sores. Avoid contact with your eyes, ears, mouth and genitals (private parts).  Wash Face and genitals (private parts)  with your normal soap.   6. Wash thoroughly, paying special attention to the area where your surgery will be performed.  7. Thoroughly rinse your body with warm water from the neck down.  8. DO NOT shower/wash with your normal soap after using and rinsing off the CHG Soap.  9. Pat yourself dry with a CLEAN TOWEL.  10. Wear CLEAN PAJAMAS to bed the night before surgery  11. Place CLEAN SHEETS on your bed the night of your first shower and DO NOT SLEEP WITH PETS.   Day of Surgery: Shower with CHG soap as directed Wear Clean/Comfortable clothing the morning of surgery Do not apply any deodorants/lotions.   Remember to brush your teeth WITH YOUR REGULAR TOOTHPASTE.   Please read over the following fact sheets that you were given.

## 2019-10-21 ENCOUNTER — Encounter: Payer: Self-pay | Admitting: Physical Therapy

## 2019-10-21 ENCOUNTER — Other Ambulatory Visit (HOSPITAL_COMMUNITY): Payer: Medicare PPO

## 2019-10-21 ENCOUNTER — Other Ambulatory Visit (HOSPITAL_COMMUNITY)
Admission: RE | Admit: 2019-10-21 | Discharge: 2019-10-21 | Disposition: A | Discharge status: Home / Self Care | Payer: Medicare PPO | Source: Ambulatory Visit | Attending: Cardiovascular Disease | Admitting: Cardiovascular Disease

## 2019-10-21 ENCOUNTER — Encounter (HOSPITAL_COMMUNITY): Payer: Self-pay

## 2019-10-21 ENCOUNTER — Ambulatory Visit: Payer: Medicare PPO | Attending: Cardiovascular Disease | Admitting: Physical Therapy

## 2019-10-21 ENCOUNTER — Other Ambulatory Visit: Payer: Self-pay

## 2019-10-21 ENCOUNTER — Ambulatory Visit (HOSPITAL_COMMUNITY)
Admission: RE | Admit: 2019-10-21 | Discharge: 2019-10-21 | Disposition: A | Discharge status: Home / Self Care | Payer: Medicare PPO | Source: Ambulatory Visit | Attending: Cardiovascular Disease | Admitting: Cardiovascular Disease

## 2019-10-21 ENCOUNTER — Encounter (HOSPITAL_COMMUNITY)
Admission: RE | Admit: 2019-10-21 | Discharge: 2019-10-21 | Disposition: A | Discharge status: Home / Self Care | Payer: Medicare PPO | Source: Ambulatory Visit | Attending: Cardiovascular Disease | Admitting: Cardiovascular Disease

## 2019-10-21 ENCOUNTER — Other Ambulatory Visit: Payer: Self-pay | Admitting: Physician Assistant

## 2019-10-21 DIAGNOSIS — R262 Difficulty in walking, not elsewhere classified: Secondary | ICD-10-CM | POA: Insufficient documentation

## 2019-10-21 DIAGNOSIS — Z20822 Contact with and (suspected) exposure to covid-19: Secondary | ICD-10-CM | POA: Insufficient documentation

## 2019-10-21 DIAGNOSIS — I700 Atherosclerosis of aorta: Secondary | ICD-10-CM | POA: Insufficient documentation

## 2019-10-21 DIAGNOSIS — I350 Nonrheumatic aortic (valve) stenosis: Secondary | ICD-10-CM

## 2019-10-21 LAB — URINALYSIS, ROUTINE W REFLEX MICROSCOPIC
Bilirubin Urine: NEGATIVE
Glucose, UA: NEGATIVE mg/dL
Hgb urine dipstick: NEGATIVE
Ketones, ur: NEGATIVE mg/dL
Leukocytes,Ua: NEGATIVE
Nitrite: NEGATIVE
Protein, ur: NEGATIVE mg/dL
Specific Gravity, Urine: 1.014 (ref 1.005–1.030)
pH: 6 (ref 5.0–8.0)

## 2019-10-21 LAB — COMPREHENSIVE METABOLIC PANEL
ALT: 15 U/L (ref 0–44)
AST: 16 U/L (ref 15–41)
Albumin: 3.7 g/dL (ref 3.5–5.0)
Alkaline Phosphatase: 29 U/L — ABNORMAL LOW (ref 38–126)
Anion gap: 10 (ref 5–15)
BUN: 12 mg/dL (ref 8–23)
CO2: 21 mmol/L — ABNORMAL LOW (ref 22–32)
Calcium: 8.8 mg/dL — ABNORMAL LOW (ref 8.9–10.3)
Chloride: 108 mmol/L (ref 98–111)
Creatinine, Ser: 0.67 mg/dL (ref 0.61–1.24)
GFR calc Af Amer: >60 mL/min (ref >60)
GFR calc non Af Amer: >60 mL/min (ref >60)
Glucose, Bld: 93 mg/dL (ref 70–99)
Potassium: 3.3 mmol/L — ABNORMAL LOW (ref 3.5–5.1)
Sodium: 139 mmol/L (ref 135–145)
Total Bilirubin: 0.8 mg/dL (ref 0.3–1.2)
Total Protein: 5.6 g/dL — ABNORMAL LOW (ref 6.5–8.1)

## 2019-10-21 LAB — BLOOD GAS, ARTERIAL
Acid-base deficit: 0.3 mmol/L (ref 0.0–2.0)
Bicarbonate: 23.3 mmol/L (ref 20.0–28.0)
Drawn by: 58793
FIO2: 21
O2 Saturation: 98.1 %
Patient temperature: 37
pCO2 arterial: 34.6 mmHg (ref 32.0–48.0)
pH, Arterial: 7.442 (ref 7.350–7.450)
pO2, Arterial: 95.4 mmHg (ref 83.0–108.0)

## 2019-10-21 LAB — SURGICAL PCR SCREEN
MRSA, PCR: NEGATIVE
Staphylococcus aureus: NEGATIVE

## 2019-10-21 LAB — APTT: aPTT: 29 seconds (ref 24–36)

## 2019-10-21 LAB — CBC
HCT: 41.1 % (ref 39.0–52.0)
Hemoglobin: 13.4 g/dL (ref 13.0–17.0)
MCH: 32.1 pg (ref 26.0–34.0)
MCHC: 32.6 g/dL (ref 30.0–36.0)
MCV: 98.6 fL (ref 80.0–100.0)
Platelets: 101 10*3/uL — ABNORMAL LOW (ref 150–400)
RBC: 4.17 MIL/uL — ABNORMAL LOW (ref 4.22–5.81)
RDW: 13 % (ref 11.5–15.5)
WBC: 6.2 10*3/uL (ref 4.0–10.5)
nRBC: 0 % (ref 0.0–0.2)

## 2019-10-21 LAB — PROTIME-INR
INR: 1 (ref 0.8–1.2)
Prothrombin Time: 12.9 seconds (ref 11.4–15.2)

## 2019-10-21 LAB — HEMOGLOBIN A1C
Hgb A1c MFr Bld: 4.4 % — ABNORMAL LOW (ref 4.8–5.6)
Mean Plasma Glucose: 79.58 mg/dL

## 2019-10-21 LAB — BRAIN NATRIURETIC PEPTIDE: B Natriuretic Peptide: 88.3 pg/mL (ref 0.0–100.0)

## 2019-10-21 LAB — SARS CORONAVIRUS 2 (TAT 6-24 HRS): SARS Coronavirus 2: NEGATIVE

## 2019-10-21 MED ORDER — POTASSIUM CHLORIDE ER 20 MEQ PO TBCR: 40.0000 meq | EXTENDED_RELEASE_TABLET | Freq: Every day | ORAL | 0 refills | Status: DC

## 2019-10-21 NOTE — Therapy (Signed)
[place], [place], [address] Phone: [phone]   Fax:  [phone]  Physical Therapy Evaluation/TAVR   Patient Details  Name: Mitchell Lowery MRN: [id number] Date of Birth: [birth year] Referring Provider (PT): Sherren Mocha, MD   Encounter Date: 10/21/2019   PT End of Session - 10/21/19 0845    Visit Number 1    PT Start Time 0840    PT Stop Time 0910    PT Time Calculation (min) 30 min    Activity Tolerance Patient tolerated treatment well    Behavior During Therapy [facility] for tasks assessed/performed           Past Medical History:  Diagnosis Date   Anal fistula    treated in [place]   Anemia    only due to myeloma- "normal now"   CAD (coronary artery disease)    Hemorrhoids    Multiple myeloma ([name])    1 month remission now-last tx. 1 month ago- /[name]   Prostate cancer [facility])    urinary retention, surgery planned-prior radiation, and seed implant about 1 year ago.   Prostatitis    Pulmonary embolus, left ([facility]) 03/28/2016   Severe aortic stenosis    UTI (lower urinary tract infection)    multiple urinary trract infection- being tx presently with antibiotic at present.    Past Surgical History:  Procedure Laterality Date   ANAL FISTULECTOMY     ANKLE SURGERY     CYSTOSCOPY     CYSTOSCOPY WITH INSERTION OF UROLIFT N/A 03/12/2015   Procedure: CYSTOSCOPY WITH INSERTION OF UROLIFT;  Surgeon: Franchot Gallo, MD;  Location: [facility];  Service: Urology;  Laterality: N/A;   PROSTATE BIOPSY     PROSTATE BIOPSY     RADIOACTIVE SEED IMPLANT N/A 07/21/2014   Procedure: RADIOACTIVE SEED IMPLANT/BRACHYTHERAPY IMPLANT;  Surgeon: Franchot Gallo, MD;  Location: [facility];  Service: Urology;  Laterality: N/A;   RIGHT/LEFT HEART CATH AND CORONARY ANGIOGRAPHY N/A 09/30/2019   Procedure: RIGHT/LEFT HEART CATH AND CORONARY ANGIOGRAPHY;  Surgeon: Sherren Mocha, MD;   Location: [facility] [facility];  Service: Cardiovascular;  Laterality: N/A;   TONSILLECTOMY      There were no vitals filed for this visit.    Subjective Assessment - 10/21/19 0843    Subjective When I am walking and come to a hill- I feel a tightness across my chest. I have a rowing machine at home and do about 500 meters at a time but do not feel the tightness. I would not say I have SOB.    Currently in Pain? No/denies              [facility] PT Assessment - 10/21/19 0001      Assessment   Medical Diagnosis severe aortic stenosis    Referring Provider (PT) Sherren Mocha, MD    Onset Date/Surgical Date --   about ayear and a half ago   Hand Dominance Right    Prior Therapy no      Precautions   Precautions None      Restrictions   Weight Bearing Restrictions No      Balance Screen   Has the patient fallen in the past 6 months No      [place] residence      Prior Function   Level of Independence Independent      Cognition   Overall Cognitive Status Within Functional Limits for tasks  assessed      Observation/Other Assessments   Focus on Therapeutic Outcomes (FOTO)  n/a TAVR      Sensation   Additional Comments tingling in bil feet      Posture/Postural Control   Posture Comments rounded shoulders with forward head but mild      ROM / Strength   AROM / PROM / Strength AROM;Strength      Strength   Overall Strength Comments Rt GHJ 4/5, Lt GHJ 5/5, bil LE 5/5    Strength Assessment Site Hand    Right/Left hand Right;Left    Right Hand Grip (lbs) 50    Left Hand Grip (lbs) 45            [facility] Pre-Surgical Assessment - 10/21/19 0001    5 Meter Walk Test- trial 1 4 sec    5 Meter Walk Test- trial 2 4 sec.     5 Meter Walk Test- trial 3 3 sec.    5 meter walk test average 3.67 sec    4 Stage Balance Test Position 2    Sit To Stand Test- trial 1 18 sec.    6 Minute Walk- Baseline yes    BP (mmHg) 139/59    HR (bpm) 55     02 Sat (%RA) 97 %    Modified Borg Scale for Dyspnea 0- Nothing at all    Perceived Rate of Exertion (Borg) 6-    6 Minute Walk Post Test yes    BP (mmHg) 138/62    HR (bpm) 61    02 Sat (%RA) 99 %    Modified Borg Scale for Dyspnea 0.5- Very, very slight shortness of breath    Perceived Rate of Exertion (Borg) 10-    Aerobic Endurance Distance Walked 1295    Endurance additional comments 5% limitation compared to age related normative values                    Objective measurements completed on examination: See above findings.                            Plan - 10/21/19 0912    PT Frequency One time visit    Consulted and Agree with Plan of Care Patient           Clinical Impression Statement: Pt is a 85 yo M presenting to OP PT for evaluation prior to possible TAVR surgery due to severe aortic stenosis. Pt reports onset of chest tightness with walking about 1.5 year ago. Symptoms are  limiting ambulation endurance most notably with inclines. Pt presents with WFL ROM and strength and denies musculoskeletal pain.  Pt ambulated a total of 1295 feet in 6 minute walk and reported .5/10 SOB on modified scale for dyspena and 10/20 RPE on Borg's perceived exertion and pain scale at the end of the walk. During the 6 minute walk test, patient's HR increased to 88 BPM and O2 saturation decreased to 96%. Based on the Short Physical Performance Battery, patient has a frailty rating of 7/12 with </= 5/12 considered frail.  Visit Diagnosis: Difficulty in walking, not elsewhere classified     Problem List Patient Active Problem List   Diagnosis Date Noted   Aortic stenosis 09/30/2019   Sepsis ([place])    Gram-negative bacteremia 07/03/2019   Thrombocytopenia ([place]) 07/03/2019   Cellulitis of left lower extremity 07/02/2019   Goals of care, counseling/discussion 03/28/2019  Peripheral neuropathy due to chemotherapy ([place]) 03/23/2018    Sensorineural hearing loss (SNHL) of both ears 11/11/2017   Hypogammaglobulinemia, acquired ([place]) 02/04/2017   Pancytopenia due to chemotherapy ([place]) 10/08/2016   Anticoagulated 04/08/2016   Pulmonary embolus, left ([place]) 03/28/2016   Fracture of other specified skull and facial bones, right side, sequela ([place]) 01/01/2016   Subarachnoid hemorrhage following injury ([place]) 10/25/2015   Loss of consciousness ([place]) 10/25/2015   Multiple myeloma in relapse ([place]) 09/21/2015   Infection of urinary tract 01/09/2015   Multiple myeloma ([place]) 08/15/2014   Malignant neoplasm of prostate ([place]) 03/10/2014   Vashon Riordan C. Leylany Nored PT, DPT 10/21/19 9:14 AM   [facility] [address] [place], [place], [address] Phone: [phone]   Fax:  [phone]  Name: CORLEONE BIEGLER MRN: [id number] Date of Birth: Jun 28, 1934

## 2019-10-21 NOTE — Progress Notes (Signed)
PCP - Dr. [name] Cardiologist - Dr. Sherren Mocha Oncologist: Dr. Donneta Romberg  PPM/ICD - Denies  Chest x-ray - 10/21/19 EKG - 09/30/19 Stress Test - Denies  ECHO - 09/01/19 Cardiac Cath - 09/30/19   Sleep Study - Denies  Pt denies being diabetic.   Blood Thinner Instructions: LD of Xarelto: 10/19/19 Aspirin Instructions: N/A  ERAS Protcol - No  COVID TEST- 10/21/19 @ 12:00 PM   Anesthesia review: Yes, cardiac hx  Patient denies shortness of breath, fever, cough and chest pain at PAT appointment   All instructions explained to the patient, with a verbal understanding of the material. Patient agrees to go over the instructions while at home for a better understanding. Patient also instructed to self quarantine after being tested for COVID-19. The opportunity to ask questions was provided.
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
## 2019-10-21 NOTE — Telephone Encounter (Signed)
[place] is requesting clarification on quantity and directions on medication potassium chloride 20 meq and only 1 tablet was prescribed. Please address

## 2019-10-21 NOTE — Anesthesia Preprocedure Evaluation (Addendum)
Anesthesia Evaluation  Patient identified by MRN, date of birth, ID band Patient awake    Reviewed: Allergy & Precautions, NPO status , Patient's Chart, lab work & pertinent test results  History of Anesthesia Complications Negative for: history of anesthetic complications  Airway Mallampati: II  TM Distance: >3 FB Neck ROM: Full    Dental  (+) Dental Advisory Given, Teeth Intact   Pulmonary PE   Pulmonary exam normal        Cardiovascular hypertension, Pt. on medications + CAD  + dysrhythmias Atrial Fibrillation + Valvular Problems/Murmurs AS  Rhythm:Regular Rate:Normal + Systolic murmurs  '21 Cath - Mid LAD lesion is 75% stenosed. 1st Diag lesion is 60% stenosed. RPDA lesion is 50% stenosed. Ost LAD to Prox LAD lesion is 30% stenosed.  1. Severe calcific AS with mean gradient 39 mmHg 2. Severe calcific single vessel CAD involving the mid-LAD (75%) and first diagonal (60%) 3. Patent LM, LCx, and RCA, with no obstructive disease 4. Normal right heart pressures Multidisciplinary team review of his case. Somewhat complicated in this patient with severe AS, calcific CAD, chronic oral anticoagulation for atrial fibrillation, and thrombocytopenia in the setting of multiple myeloma (plt 88k). Considerations include TAVR +/- PCI.   '21 Carotid US - 1-39% b/l ICAS  '21 TTE - Severe AS. Dimensionless index 0.24. Max gradient obtained from apical window. Trivial AI. Aortic valve area, by VTI measures 0.75 cm. Aortic valve mean gradient measures 40.0 mmHg. EF 55 to 60%. Moderate left ventricular hypertrophy. Grade I diastolic dysfunction (impaired relaxation). Trivial MR, mild MS. The mean mitral valve gradient is 2.0 mmHg with average heart rate of 59 bpm.     Neuro/Psych  Hx [facility] 2017   Neuromuscular disease (neuropathy) negative psych ROS   GI/Hepatic negative GI ROS, Neg liver ROS,   Endo/Other  negative endocrine ROS   Renal/GU negative Renal ROS     Musculoskeletal negative musculoskeletal ROS (+)   Abdominal   Peds  Hematology  On Xarelto Multiple myeloma Thrombocytopenia, Plt 101k    Anesthesia Other Findings Covid test negative   Reproductive/Obstetrics                            Anesthesia Physical Anesthesia Plan  ASA: IV  Anesthesia Plan: MAC   Post-op Pain Management:    Induction: Intravenous  PONV Risk Score and Plan: 1 and Propofol infusion and Treatment may vary due to age or medical condition  Airway Management Planned: Natural Airway and Simple Face Mask  Additional Equipment: None  Intra-op Plan:   Post-operative Plan:   Informed Consent: I have reviewed the patients History and Physical, chart, labs and discussed the procedure including the risks, benefits and alternatives for the proposed anesthesia with the patient or authorized representative who has indicated his/her understanding and acceptance.       Plan Discussed with: CRNA and Anesthesiologist  Anesthesia Plan Comments:       Anesthesia Quick Evaluation

## 2019-10-24 MED ORDER — DEXMEDETOMIDINE HCL IN NACL 400 MCG/100ML IV SOLN
0.1000 ug/kg/h | INTRAVENOUS | Status: AC
  Administered 2019-10-25: 1 ug/kg/h via INTRAVENOUS
  Filled 2019-10-24 (×2): qty 100

## 2019-10-24 MED ORDER — SODIUM CHLORIDE 0.9 % IV SOLN
INTRAVENOUS | Status: DC
  Filled 2019-10-24: qty 30

## 2019-10-24 MED ORDER — MAGNESIUM SULFATE 50 % IJ SOLN
40.0000 meq | INTRAMUSCULAR | Status: DC
  Filled 2019-10-24: qty 9.85

## 2019-10-24 MED ORDER — VANCOMYCIN HCL 1250 MG/250ML IV SOLN
1250.0000 mg | INTRAVENOUS | Status: AC
  Administered 2019-10-25: 1250 mg via INTRAVENOUS
  Filled 2019-10-24 (×2): qty 250

## 2019-10-24 MED ORDER — POTASSIUM CHLORIDE 2 MEQ/ML IV SOLN
80.0000 meq | INTRAVENOUS | Status: DC
  Filled 2019-10-24: qty 40

## 2019-10-24 MED ORDER — SODIUM CHLORIDE 0.9 % IV SOLN
1.5000 g | INTRAVENOUS | Status: AC
  Administered 2019-10-25: 1.5 g via INTRAVENOUS
  Filled 2019-10-24: qty 1.5

## 2019-10-24 MED ORDER — NOREPINEPHRINE 4 MG/250ML-% IV SOLN
0.0000 ug/min | INTRAVENOUS | Status: DC
  Filled 2019-10-24: qty 250

## 2019-10-24 NOTE — H&P (Signed)
[address]       [place] [address]             [phone]      Cardiothoracic Surgery Admission History and Physical   Referring Provider is Mitchell Mocha, MD  Primary Cardiologist is Mitchell Mocha, MD  PCP is Mitchell Huddle, MD      Chief Complaint  Patient presents with  . Aortic Stenosis       HPI:    The patient is an active 85 year old gentleman with a history of paroxysmal atrial fibrillation on Xarelto, multiple myeloma followed by Dr. [name] and in remission on chemotherapy, as well as moderate aortic stenosis. He had an echocardiogram on 08/16/2018 showing a mean gradient of 35 mmHg with a peak velocity of 3.8 m/s and a peak gradient of 58 mmHg. There was felt to be some progression from the prior echo in June 2019 when the mean gradient was 26 mmHg. He said that he uses a rowing machine several days per week and gets his heart rate up into the 120s usually doing short intervals with a total of 5000 m. He has no symptoms with that level of exertion. He says that when he is walking up a hill he develops discomfort across his chest that resolves with rest and usually does not recur when he continues walking. This has been going on for approximately 6 months. He said that this not associated with any shortness of breath. He has had fatigue. He denies orthopnea. He denies dizziness and syncope. He had a repeat echo on 09/01/2019 which showed an increase in the mean gradient to 40 mmHg with a peak gradient of 68 mmHg. Aortic valve area was measured at 0.75 cm with a dimensionless index of 0.24. Left ventricular ejection fraction was 55 to 60% with moderate LVH and grade 1 diastolic dysfunction.   He is a PhD Marketing executive. His son is a physician in [place] and his son-in-law is Dr. Dierdre Lowery, a neurosurgeon in [place].      Past Medical History:  Diagnosis Date  . Anal fistula    treated in [place]  . Anemia    only due to myeloma- "normal now"  . CAD  (coronary artery disease)   . Hemorrhoids   . Multiple myeloma (HCC)    1 month remission now-last tx. 1 month ago- /Dr. [name]  . Prostate cancer [facility])    urinary retention, surgery planned-prior radiation, and seed implant about 1 year ago.  . Prostatitis   . Pulmonary embolus, left ([place]) 03/28/2016  . Severe aortic stenosis   . UTI (lower urinary tract infection)    multiple urinary trract infection- being tx presently with antibiotic at present.        Past Surgical History:  Procedure Laterality Date  . ANAL FISTULECTOMY    . ANKLE SURGERY    . CYSTOSCOPY    . CYSTOSCOPY WITH INSERTION OF UROLIFT N/A 03/12/2015   Procedure: CYSTOSCOPY WITH INSERTION OF UROLIFT; Surgeon: Mitchell Gallo, MD; Location: [facility]; Service: Urology; Laterality: N/A;  . PROSTATE BIOPSY    . PROSTATE BIOPSY    . RADIOACTIVE SEED IMPLANT N/A 07/21/2014   Procedure: RADIOACTIVE SEED IMPLANT/BRACHYTHERAPY IMPLANT; Surgeon: Mitchell Gallo, MD; Location: [facility]; Service: Urology; Laterality: N/A;  . RIGHT/LEFT HEART CATH AND CORONARY ANGIOGRAPHY N/A 09/30/2019   Procedure: RIGHT/LEFT HEART CATH AND CORONARY ANGIOGRAPHY; Surgeon: Mitchell Mocha, MD; Location: [place] [facility]; Service: Cardiovascular; Laterality: N/A;  .  TONSILLECTOMY          Family History  Problem Relation Age of Onset  . Cancer Maternal Grandfather    mouth/throat ca   Social History        Socioeconomic History  . Marital status: Married    Spouse name: Not on file  . Number of children: Not on file  . Years of education: Not on file  . Highest education level: Not on file  Occupational History  . Not on file  Tobacco Use  . Smoking status: Never Smoker  . Smokeless tobacco: Never Used  Vaping Use  . Vaping Use: Never used  Substance and Sexual Activity  . Alcohol use: Yes    Comment: 1 glass wine daily  . Drug use: No  . Sexual activity: Yes  Other Topics Concern  . Not on file  Social  History Narrative  . Not on file   Social Determinants of Health      Financial Resource Strain:   . Difficulty of Paying Living Expenses: Not on file  Food Insecurity:   . Worried About Charity fundraiser in the Last Year: Not on file  . Ran Out of Food in the Last Year: Not on file  Transportation Needs:   . Lack of Transportation (Medical): Not on file  . Lack of Transportation (Non-Medical): Not on file  Physical Activity:   . Days of Exercise per Week: Not on file  . Minutes of Exercise per Session: Not on file  Stress:   . Feeling of Stress : Not on file  Social Connections:   . Frequency of Communication with Friends and Family: Not on file  . Frequency of Social Gatherings with Friends and Family: Not on file  . Attends Religious Services: Not on file  . Active Member of Clubs or Organizations: Not on file  . Attends Archivist Meetings: Not on file  . Marital Status: Not on file  Intimate Partner Violence:   . Fear of Current or Ex-Partner: Not on file  . Emotionally Abused: Not on file  . Physically Abused: Not on file  . Sexually Abused: Not on file         Current Outpatient Medications  Medication Sig Dispense Refill  . Cholecalciferol 2000 units TABS Take 2,000 Units by mouth daily.     . diphenoxylate-atropine (LOMOTIL) 2.5-0.025 MG tablet Take 1 tablet by mouth daily as needed for diarrhea or loose stools. Usually takes 1 tablet a week    . losartan (COZAAR) 50 MG tablet Take 50 mg by mouth daily.     . Psyllium (QC NATURAL VEGETABLE) 95 % POWD Take 1 packet by mouth daily.     . valACYclovir (VALTREX) 500 MG tablet Take 500 mg by mouth daily.     . vitamin B-12 (CYANOCOBALAMIN) 500 MCG tablet Take 500 mcg by mouth daily.    Mitchell Lowery 20 MG TABS tablet TAKE 1 TABLET BY MOUTH DAILY WITH SUPPER. (Patient taking differently: Take 20 mg by mouth daily with supper. ) 30 tablet 11   No current facility-administered medications for this visit.         Allergies  Allergen Reactions  . Bee Venom Other (See Comments)    Fatigue and flushing   Review of Systems:   General: normal appetite, + decreased energy, no weight gain, no weight loss, no fever  Cardiac: + chest pain with exertion, no chest pain at rest, noSOB with exertion, no resting  SOB, no PND, no orthopnea, no palpitations, no arrhythmia, no atrial fibrillation, no LE edema, no dizzy spells, no syncope  Respiratory: no shortness of breath, no home oxygen, no productive cough, no dry cough, no bronchitis, no wheezing, no hemoptysis, no asthma, no pain with inspiration or cough, no sleep apnea, no CPAP at night  GI: no difficulty swallowing, no reflux, no frequent heartburn, no hiatal hernia, no abdominal pain, + constipation, + diarrhea, no hematochezia, no hematemesis, no melena  GU: no dysuria, no frequency, no urinary tract infection, no hematuria, no enlarged prostate, no kidney stones, no kidney disease  Vascular: no pain suggestive of claudication, no pain in feet, no leg cramps, no varicose veins, no DVT, no non-healing foot ulcer  Neuro: no stroke, no TIA's, no seizures, no headaches, no temporary blindness one eye, no slurred speech, no peripheral neuropathy, no chronic pain, no instability of gait, no memory/cognitive dysfunction  Musculoskeletal: no arthritis, no joint swelling, + myalgias, no difficulty walking, normal mobility  Skin: no rash, no itching, no skin infections, no pressure sores or ulcerations  Psych: no anxiety, no depression, no nervousness, no unusual recent stress  Eyes: no blurry vision, no floaters, no recent vision changes, + wears glasses  ENT: no hearing loss, no loose or painful teeth, no dentures, last saw dentist 09/29/19  Hematologic: no easy bruising, no abnormal bleeding, no clotting disorder, no frequent epistaxis  Endocrine: no diabetes, does not check CBG's at home    Physical Exam:   BP 104/66  Pulse 77  Temp 97.7 F (36.5 C) (Skin)   Resp 20  Ht '5\' 11"'  (1.803 m)  Wt 171 lb (77.6 kg)  SpO2 96% Comment: RA  BMI 23.85 kg/m  General: Elderly but well-appearing and fit.  HEENT: Unremarkable, NCAT, PERLA, EOMI  Neck: no JVD, no bruits, no adenopathy  Chest: clear to auscultation, symmetrical breath sounds, no wheezes, no rhonchi  CV: RRR, grade lll/VI crescendo/decrescendo murmur heard best at RSB, no diastolic murmur  Abdomen: soft, non-tender, no masses  Extremities: warm, well-perfused, pulses palpable at ankle, no LE edema  Rectal/GU Deferred  Neuro: Grossly non-focal and symmetrical throughout  Skin: Clean and dry, no rashes, no breakdown    Diagnostic Tests:   ECHOCARDIOGRAM REPORT     Patient Name: ANDER WAMSER Date of Exam: 09/01/2019  Medical Rec #: [id number] Height: 71.0 in  Accession #: [id number] Weight: 173.7 lb  Date of Birth: 1934-09-18 BSA: 1.986 m  Patient Age: 86 years BP: 116/72 mmHg  Patient Gender: M HR: 59 bpm.  Exam Location: [place]   Procedure: 2D Echo, Cardiac Doppler and Color Doppler   MODIFIED REPORT:  This report was modified by Cherlynn Kaiser MD on 09/02/2019 due to  revision.  Indications: I35.0 Nonrheumatic aortic (valve) stenosis   History: Patient has prior history of Echocardiogram examinations,  most  recent 08/16/2018. Left pulmonary embolus. Multiple  myeloma.  Malignant neoplasm of prostate.   Sonographer: Diamond Nickel RCS  Referring Phys: [place]  Diagnosing Phys: Cherlynn Kaiser MD   IMPRESSIONS    1. Severe aortic valve stenosis. Severely calcified aortic valve.  Dimensionless index 0.24. Maximum gradient obtained from apical window.  The aortic valve is abnormal. Aortic valve regurgitation is trivial.  Aortic valve area, by VTI measures 0.75 cm.  Aortic valve mean gradient measures 40.0 mmHg. Aortic valve Vmax measures  4.12 m/s.  2. Left ventricular ejection fraction, by estimation, is 55 to 60%. The  left ventricle has  normal  function. The left ventricle has no regional  wall motion abnormalities. There is moderate left ventricular hypertrophy.  Left ventricular diastolic  parameters are consistent with Grade I diastolic dysfunction (impaired  relaxation).  3. Right ventricular systolic function is normal. The right ventricular  size is normal. Tricuspid regurgitation signal is inadequate for assessing  PA pressure.  4. The mitral valve is degenerative. Trivial mitral valve regurgitation.  Mild mitral stenosis. The mean mitral valve gradient is 2.0 mmHg with  average heart rate of 59 bpm.  5. The inferior vena cava is normal in size with greater than 50%  respiratory variability, suggesting right atrial pressure of 3 mmHg.   Comparison(s): A prior study was performed on 08/16/18. Aortic valve  stenosis is now severe.   FINDINGS  Left Ventricle: Left ventricular ejection fraction, by estimation, is 55  to 60%. The left ventricle has normal function. The left ventricle has no  regional wall motion abnormalities. The left ventricular internal cavity  size was normal in size. There is  moderate left ventricular hypertrophy. Left ventricular diastolic  parameters are consistent with Grade I diastolic dysfunction (impaired  relaxation).   Right Ventricle: The right ventricular size is normal. No increase in  right ventricular wall thickness. Right ventricular systolic function is  normal. Tricuspid regurgitation signal is inadequate for assessing PA  pressure.   Left Atrium: Left atrial size was normal in size.   Right Atrium: Right atrial size was normal in size.   Pericardium: There is no evidence of pericardial effusion.   Mitral Valve: The mitral valve is degenerative in appearance. Mildly  decreased mobility of the mitral valve leaflets. Moderate mitral annular  calcification. Trivial mitral valve regurgitation. Mild mitral valve  stenosis. The mean mitral valve gradient is  2.0 mmHg with average  heart rate of 59 bpm.   Tricuspid Valve: The tricuspid valve is normal in structure. Tricuspid  valve regurgitation is trivial. No evidence of tricuspid stenosis.   Aortic Valve: Severe aortic valve stenosis. Severely calcified aortic  valve. Dimensionless index 0.24. Maximum gradient obtained from apical  window. The aortic valve is abnormal. Aortic valve regurgitation is  trivial. There is severe calcifcation of the  aortic valve. Aortic valve mean gradient measures 40.0 mmHg. Aortic valve  peak gradient measures 67.9 mmHg. Aortic valve area, by VTI measures 0.75  cm.   Pulmonic Valve: The pulmonic valve was grossly normal. Pulmonic valve  regurgitation is trivial. No evidence of pulmonic stenosis.   Aorta: The aortic root is normal in size and structure.   Venous: The inferior vena cava is normal in size with greater than 50%  respiratory variability, suggesting right atrial pressure of 3 mmHg.   IAS/Shunts: No atrial level shunt detected by color flow Doppler.    LEFT VENTRICLE  PLAX 2D  LVIDd: 3.70 cm Diastology  LVIDs: 2.30 cm LV e' lateral: 6.57 cm/s  LV PW: 1.70 cm LV E/e' lateral: 11.2  LV IVS: 2.10 cm LV e' medial: 4.71 cm/s  LVOT diam: 2.00 cm LV E/e' medial: 15.5  LV SV: 76  LV SV Index: 39  LVOT Area: 3.14 cm    RIGHT VENTRICLE  RV Basal diam: 2.20 cm  RV S prime: 9.49 cm/s  TAPSE (M-mode): 2.3 cm   LEFT ATRIUM Index RIGHT ATRIUM Index  LA diam: 3.90 cm 1.96 cm/m RA Area: 18.50 cm  LA Vol (A2C): 62.2 ml 31.32 ml/m RA Volume: 47.00 ml 23.67 ml/m  LA Vol (A4C): 55.9 ml  28.15 ml/m  LA Biplane Vol: 59.6 ml 30.01 ml/m  AORTIC VALVE  AV Area (Vmax): 0.72 cm  AV Area (Vmean): 0.70 cm  AV Area (VTI): 0.75 cm  AV Vmax: 412.00 cm/s  AV Vmean: 282.500 cm/s  AV VTI: 1.020 m  AV Peak Grad: 67.9 mmHg  AV Mean Grad: 40.0 mmHg  LVOT Vmax: 94.20 cm/s  LVOT Vmean: 63.300 cm/s  LVOT VTI: 0.244 m  LVOT/AV VTI ratio: 0.24   AORTA  Ao Root diam: 2.90 cm    MITRAL VALVE  MV Area (PHT): 1.66 cm SHUNTS  MV Mean grad: 2.0 mmHg Systemic VTI: 0.24 m  MV Decel Time: 456 msec Systemic Diam: 2.00 cm  MV E velocity: 73.23 cm/s  MV A velocity: 131.33 cm/s  MV E/A ratio: 0.56   Cherlynn Kaiser MD  Electronically signed by Cherlynn Kaiser MD  Signature Date/Time: 09/01/2019/1:07:23 PM     Panel Physicians Referring Physician Case Authorizing Physician  Mit[place] Mocha, MD (Primary)    Procedures  RIGHT/LEFT HEART CATH AND CORONARY ANGIOGRAPHY  Conclusion  Mid LAD lesion is 75% stenosed.  1st Diag lesion is 60% stenosed.  RPDA lesion is 50% stenosed.  Ost LAD to Prox LAD lesion is 30% stenosed. 1. Severe calcific aortic stenosis with mean gradient 39 mmHg  2. Severe calcific single vessel CAD involving the mid-LAD (75%) and first diagonal (60%)  3. Patent left main, LCx, and RCA, with no obstructive disease  4. Normal right heart pressures  Multidisciplinary team review of his case. Somewhat complicated in this patient with severe AS, calcific CAD, chronic oral anticoagulation for atrial fibrillation, and thrombocytopenia in the setting of multiple myeloma (plt 88k). Considerations include TAVR +/- PCI.  Recommendations  Antiplatelet/Anticoag Recommend to resume Rivaroxaban, at currently prescribed dose and frequency on 10/01/2019. Concurrent antiplatelet therapy not recommended.  Indications  Severe aortic stenosis [I35.0 (ICD-10-CM)]  Procedural Details  Technical Details INDICATION: Severe, symptomatic aortic stenosis  PROCEDURAL DETAILS: There was an indwelling IV in a right antecubital vein. Using normal sterile technique, the IV was changed out for a 5 Fr brachial sheath over a 0.018 inch wire. The right wrist was then prepped, draped, and anesthetized with 1% lidocaine. Using the modified Seldinger technique a 5/6 French Slender sheath was placed in the right radial artery. Intra-arterial verapamil was administered through the radial  artery sheath. IV heparin was administered after a JR4 catheter was advanced into the central aorta. A Swan-Ganz catheter was used for the right heart catheterization. Standard protocol was followed for recording of right heart pressures and sampling of oxygen saturations. Fick cardiac output was calculated. Standard Judkins catheters were used for selective coronary angiography. LV pressure is recorded and an aortic valve pullback is performed. There were no immediate procedural complications. The patient was transferred to the post catheterization recovery area for further monitoring.    Estimated blood loss <50 mL.   During this procedure medications were administered to achieve and maintain moderate conscious sedation while the patient's heart rate, blood pressure, and oxygen saturation were continuously monitored and I was present face-to-face 100% of this time.  Medications  (Filter: Administrations occurring from 1034 to 1117 on 09/30/19)  (important) Continuous medications are totaled by the amount administered until 09/30/19 1117.  Heparin (Porcine) in NaCl 1000-0.9 UT/500ML-% SOLN (mL)  Total volume: 1,000 mL  Date/Time  Rate/Dose/Volume Action  09/30/19 1041  500 mL Given  1041  500 mL Given  midazolam (VERSED) injection (mg)  Total dose: 1 mg  Date/Time  Rate/Dose/Volume Action  09/30/19 1045  1 mg Given  fentaNYL (SUBLIMAZE) injection (mcg)  Total dose: 25 mcg  Date/Time  Rate/Dose/Volume Action  09/30/19 1045  25 mcg Given  lidocaine (PF) (XYLOCAINE) 1 % injection (mL)  Total volume: 4 mL  Date/Time  Rate/Dose/Volume Action  09/30/19 1051  2 mL Given  1053  2 mL Given  Radial Cocktail/Verapamil only (mL)  Total volume: 10 mL  Date/Time  Rate/Dose/Volume Action  09/30/19 1053  10 mL Given  heparin sodium (porcine) injection (Units)  Total dose: 4,000 Units  Date/Time  Rate/Dose/Volume Action  09/30/19 1100  4,000 Units Given  iohexol (OMNIPAQUE) 350 MG/ML injection  (mL)  Total volume: 70 mL  Date/Time  Rate/Dose/Volume Action  09/30/19 1112  70 mL Given  Sedation Time  Sedation Time Physician-1: 24 minutes 26 seconds  Contrast  Medication Name Total Dose  iohexol (OMNIPAQUE) 350 MG/ML injection 70 mL  Radiation/Fluoro  Fluoro time: 5.5 (min)  DAP: 13.7 (Gycm2)  Cumulative Air Kerma: 292 (mGy)  Coronary Findings  Diagnostic  Dominance: Right  Left Anterior Descending  Ost LAD to Prox LAD lesion is 30% stenosed.  Mid LAD lesion is 75% stenosed. The lesion is eccentric. The lesion is severely calcified.  First Diagonal Branch  1st Diag lesion is 60% stenosed.  Left Circumflex  The vessel exhibits minimal luminal irregularities.  Right Coronary Artery  Vessel is large. There is mild diffuse disease throughout the vessel.  Right Posterior Descending Artery  RPDA lesion is 50% stenosed.  Intervention  No interventions have been documented.  Right Heart  Right Heart Pressures Normal right heart pressures  Left Heart  Aortic Valve There is severe aortic valve stenosis. The aortic valve is calcified. There is restricted aortic valve motion. Mean gradient 39 mmHg, Peak-to-peak gradient 48 mmHg, peak instantaneous gradient 55 mmHg. Calculated AVA 1.5 square cm (high CO 8.0 L/min)  Coronary Diagrams  Diagnostic  Dominance: Right   Intervention  Implants     No implant documentation for this case.  Syngo Images  Show images for CARDIAC CATHETERIZATION  Images on Long Term Storage  Show images for Felder, Lebeda "[name]" Link to Procedure Log    Procedure Log  Hemo Data   Most Recent Value  Fick Cardiac Output 8.06 L/min  Fick Cardiac Output Index 4.08 (L/min)/BSA  Aortic Mean Gradient 38.64 mmHg  Aortic Peak Gradient 48 mmHg  Aortic Valve Area 1.51  Aortic Value Area Index 0.77 cm2/BSA  RA A Wave 1 mmHg  RA V Wave 0 mmHg  RA Mean 0 mmHg  RV Systolic Pressure 23 mmHg  RV Diastolic Pressure -1 mmHg  RV EDP 2 mmHg  PA Systolic  Pressure 23 mmHg  PA Diastolic Pressure 2 mmHg  PA Mean 11 mmHg  AO Systolic Pressure 326 mmHg  AO Diastolic Pressure 45 mmHg  AO Mean 67 mmHg  LV Systolic Pressure 712 mmHg  LV Diastolic Pressure 0 mmHg  LV EDP 5 mmHg  AOp Systolic Pressure 458 mmHg  AOp Diastolic Pressure 44 mmHg  AOp Mean Pressure 67 mmHg  LVp Systolic Pressure 099 mmHg  LVp Diastolic Pressure 0 mmHg  LVp EDP Pressure 6 mmHg  QP/QS 1  TPVR Index 2.69 HRUI  TSVR Index 16.4 HRUI  TPVR/TSVR Ratio 0.16     ADDENDUM REPORT: 09/13/2019 16:22  ADDENDUM:  Extracardiac findings will be described separately under dictation  for contemporaneously obtained CTA chest, abdomen and pelvis dated  09/13/2019. Please see that  dictation for full description.  Electronically Signed  By: Vinnie Langton M.D.  On: 09/13/2019 16:22   Addended by Etheleen Mayhew, MD on 09/13/2019 4:24 PM  Study Result  Addenda  ADDENDUM REPORT: 09/13/2019 16:22  ADDENDUM:  Extracardiac findings will be described separately under dictation  for contemporaneously obtained CTA chest, abdomen and pelvis dated  09/13/2019. Please see that dictation for full description.  Electronically Signed  By: Vinnie Langton M.D.  On: 09/13/2019 16:22   Signed by Etheleen Mayhew, MD on 09/13/2019 4:24 PM  Narrative & Impression  CLINICAL DATA: Aortic Stenosis  EXAM:  Cardiac TAVR CT  TECHNIQUE:  The patient was scanned on a Siemens Force 268 slice scanner. A 120  kV retrospective scan was triggered in the ascending thoracic aorta  at 140 HU's. Gantry rotation speed was 250 msecs and collimation was  .6 mm. No beta blockade or nitro were given. The 3D data set was  reconstructed in 5% intervals of the R-R cycle. Systolic and  diastolic phases were analyzed on a dedicated work station using  MPR, MIP and VRT modes. The patient received 80 cc of contrast.  FINDINGS:  Aortic Valve: Tri leaflet calcified with restricted leaflet motion  Aortic valve  calcium score 3904  Aorta: No aneurysm normal arch vessels moderate calcific  atherosclerosis  Sino-tubular Junction: 28 mm  Ascending Thoracic Aorta: 33 mm  Aortic Arch: 28 mm  Descending Thoracic Aorta: 25 mm  Sinus of Valsalva Measurements:  Non-coronary: 31.5 mm  Right - coronary: 31.4 mm  Left - coronary: 32.9 mm  Coronary Artery Height above Annulus:  Left Main: Shallow at 10 mm  Right Coronary: 15 mm above annulus  Virtual Basal Annulus Measurements:  Maximum / Minimum Diameter: 31.2 mm x 24 mm  Perimeter: 89.7 mm c  Area: 595 mm 2 c  Optimum Fluoroscopic Angle for Delivery: RAO 13 Caudal 29  IMPRESSION:  1. Tri leaflet calcified AV with score 3904 consistent with severe  AS  2. Shallow LM coronary artery 10 mm above aortic annulus  3. Annular area of 595 mm2 suitable for a 29 mm Sapien 3 valve  4. Optimum angiographic angle for deployment RAO 13 Caudal 29  degrees  5. Normal aortic root 3.3 cm  6. No LAA thrombus  Jenkins Rouge  Electronically Signed:  By: Jenkins Rouge M.D.  On: 09/13/2019 14:15     Narrative & Impression  CLINICAL DATA: 85 year old male with history of severe aortic  stenosis. Preprocedural study prior to potential transcatheter  aortic valve replacement (TAVR) procedure. Additional history of  multiple myeloma.  EXAM:  CT ANGIOGRAPHY CHEST, ABDOMEN AND PELVIS  TECHNIQUE:  Multidetector CT imaging through the chest, abdomen and pelvis was  performed using the standard protocol during bolus administration of  intravenous contrast. Multiplanar reconstructed images and MIPs were  obtained and reviewed to evaluate the vascular anatomy.  CONTRAST: 116m OMNIPAQUE IOHEXOL 350 MG/ML SOLN  COMPARISON: Chest CT 07/23/2016.  FINDINGS:  CTA CHEST FINDINGS  Cardiovascular: Heart size is normal. There is no significant  pericardial fluid, thickening or pericardial calcification. There is  aortic atherosclerosis, as well as atherosclerosis of the great   vessels of the mediastinum and the coronary arteries, including  calcified atherosclerotic plaque in the left anterior descending and  right coronary arteries. Severe thickening calcification of the  aortic valve. Calcifications of the mitral annulus.  Mediastinum/Lymph Nodes: No pathologically enlarged mediastinal or  hilar lymph nodes. Esophagus is unremarkable in appearance. No  axillary lymphadenopathy.  Lungs/Pleura: No suspicious appearing pulmonary nodules or masses  are noted. No acute consolidative airspace disease. Trace right  pleural effusion lying dependently. Small amount of pleural  enhancement in the base of the right hemithorax best appreciated on  axial image 84 of series 15.  Musculoskeletal/Soft Tissues: Pole multiple osseous lesions, ranging  from lytic to sclerotic. The largest of these is in the right-side  of T10 vertebral body (axial image 81 of series 16) measuring 2.9 x  1.7 cm, the with a mixed lytic and sclerotic appearance immediately  adjacent to a soft tissue mass in the paravertebral region of the  medial right hemithorax which appears to be subpleural which  measures 1.6 x 1.8 cm (axial image 82 of series 15).  CTA ABDOMEN AND PELVIS FINDINGS  Hepatobiliary: No suspicious cystic or solid hepatic lesions. No  intra or extrahepatic biliary ductal dilatation. Gallbladder is  normal in appearance.  Pancreas: No pancreatic mass. No pancreatic ductal dilatation. No  pancreatic or peripancreatic fluid collections or inflammatory  changes.  Spleen: Unremarkable.  Adrenals/Urinary Tract: In the lower pole the left kidney there is a  2.6 x 2.1 cm low-attenuation lesion compatible with a simple cyst.  Right kidney and bilateral adrenal glands are normal in appearance.  No hydroureteronephrosis. Urinary bladder appears partially  decompressed, the demonstrates thickened and trabeculated wall with  multiple small diverticuli, largest of which is posteriorly   measuring 1.6 cm.  Stomach/Bowel: Normal appearance of the stomach. No pathologic  dilatation of small bowel or colon. Normal appendix.  Vascular/Lymphatic: Aortic atherosclerosis, without evidence of  aneurysm or dissection in the abdominal or pelvic vasculature.  Vascular findings and measurements pertinent to potential TAVR  procedure, as detailed below. No lymphadenopathy noted in the  abdomen or pelvis.  Reproductive: Brachytherapy implants throughout the prostate gland.  Other: No significant volume of ascites. No pneumoperitoneum.  Musculoskeletal: Multiple osseous lesions which range from lytic to  sclerotic, most notable involving the L5 vertebral body where there  are pathologic compression fractures of both the superior and  inferior endplates with 15% loss of central vertebral body height.  VASCULAR MEASUREMENTS PERTINENT TO TAVR:  AORTA:  Minimal Aortic Diameter-18 x 16 mm  Severity of Aortic Calcification-mild-to-moderate  RIGHT PELVIS:  Right Common Iliac Artery -  Minimal Diameter-9.9 x 9.4 mm  Tortuosity-mild  Calcification-moderate  Right External Iliac Artery -  Minimal Diameter-10.2 x 10.7 mm  Tortuosity-severe  Calcification-mild  Right Common Femoral Artery -  Minimal Diameter-8.7 x 9.5 mm  Tortuosity-mild  Calcification-mild-to-moderate  LEFT PELVIS:  Left Common Iliac Artery -  Minimal Diameter-10.1 x 10.0 mm  Tortuosity-mild  Calcification-moderate  Left External Iliac Artery -  Minimal Diameter-9.3 x 7.6 mm  Tortuosity-moderate  Calcification-mild  Left Common Femoral Artery -  Minimal Diameter-10.7 x 8.9 mm  Tortuosity-mild  Calcification-mild  Review of the MIP images confirms the above findings.  IMPRESSION:  1. Vascular findings and measurements pertinent to potential TAVR  procedure, as detailed above.  2. Severe thickening and calcification of the aortic valve,  compatible with reported clinical history of severe aortic stenosis.  3.  Widespread osseous lesions compatible with reported clinical  history of multiple myeloma. Additional enhancing soft tissue  paravertebral lesion in the lower right hemithorax and small amount  of pleural enhancement in the lower right hemithorax also noted.  4. Aortic atherosclerosis, in addition to two vessel coronary artery  disease.  5. Additional incidental findings, as above.  Electronically Signed  By: Vinnie Langton M.D.  On: 09/13/2019 17:58    Procedure: AVR + CAB   Risk of Mortality: 2.063%  Renal Failure: 1.363%  Permanent Stroke: 1.723%  Prolonged Ventilation: 4.055%  DSW Infection: 0.249%  Reoperation: 3.440%  Morbidity or Mortality: 7.625%  Short Length of Stay: 24.645%  Long Length of Stay: 9.544%    Impression:   This 85 year old gentleman has stage D, severe, symptomatic aortic stenosis with CCS class II symptoms of exertional angina with walking up hills as well as exertional fatigue. He is NYHA class l-ll with no shortness of breath, mild fatigue only with heavy exertion. I have personally reviewed his 2D echocardiogram, cardiac catheterization, and CTA studies. His echocardiogram shows a trileaflet aortic valve with severe calcification and restricted leaflet mobility. The mean gradient has increased over the last few years to 40 mmHg consistent with severe aortic stenosis. Left ventricular systolic function is normal with moderate LVH. His cardiac catheterization shows a mean gradient of 39 mmHg across the aortic valve. Coronary angiography shows a 75% calcified mid LAD stenosis as well as 60% first diagonal stenosis. This LAD lesion is probably hemodynamically significant but I think his severe aortic stenosis is more likely to be contributing to his symptoms. This lesion would require atherectomy and stenting over a long area in a patient with severe aortic stenosis and I think it is probably best to continue medical therapy for this coronary lesion and replace his  aortic valve. PCI and stenting in this patient would be complicated by the use of chronic oral anticoagulation for atrial fibrillation as well as thrombocytopenia in the setting of multiple myeloma and chronic chemotherapy for that. I think TAVR would be the best option for this patient given his advanced age and comorbidities including multiple myeloma. His gated cardiac CTA shows anatomy suitable for transcatheter aortic valve replacement using a SAPIEN 3 valve. His abdominal and pelvic CTA shows adequate pelvic vascular anatomy to allow transfemoral insertion.   The patient and his wife were counseled at length regarding treatment alternatives for management of severe symptomatic aortic stenosis. The risks and benefits of surgical intervention has been discussed in detail. Long-term prognosis with medical therapy was discussed. Alternative approaches such as conventional surgical aortic valve replacement, transcatheter aortic valve replacement, and palliative medical therapy were compared and contrasted at length. This discussion was placed in the context of the patient's own specific clinical presentation and past medical history. All of their questions have been addressed.  Following the decision to proceed with transcatheter aortic valve replacement, a discussion was held regarding what types of management strategies would be attempted intraoperatively in the event of life-threatening complications, including whether or not the patient would be considered a candidate for the use of cardiopulmonary bypass and/or conversion to open sternotomy for attempted surgical intervention. The patient is aware of the fact that transient use of cardiopulmonary bypass may be necessary. He is 85 years old but still very active and I think he would be a candidate for emergent sternotomy if needed to manage any intraoperative complications. The patient has been advised of a variety of complications that might develop  including but not limited to risks of death, stroke, paravalvular leak, aortic dissection or other major vascular complications, aortic annulus rupture, device embolization, cardiac rupture or perforation, mitral regurgitation, acute myocardial infarction, arrhythmia, heart block or bradycardia requiring permanent pacemaker placement, congestive heart failure, respiratory failure, renal failure, pneumonia, infection, other late complications related to structural valve deterioration or migration, or other  complications that might ultimately cause a temporary or permanent loss of functional independence or other long term morbidity. The patient provides full informed consent for the procedure as described and all questions were answered.   Plan:   Transfemoral transcatheter aortic valve replacement using a SAPIEN 3 valve.    [name], MD

## 2019-10-25 ENCOUNTER — Inpatient Hospital Stay (HOSPITAL_COMMUNITY): Payer: Medicare PPO | Admitting: Anesthesiology

## 2019-10-25 ENCOUNTER — Inpatient Hospital Stay (HOSPITAL_COMMUNITY): Payer: Medicare PPO | Admitting: Vascular Surgery

## 2019-10-25 ENCOUNTER — Ambulatory Visit (HOSPITAL_COMMUNITY)
Admission: RE | Admit: 2019-10-25 | Discharge: 2019-10-25 | Disposition: A | Discharge status: Home / Self Care | Payer: Medicare PPO | Source: Ambulatory Visit | Attending: Cardiovascular Disease | Admitting: Cardiovascular Disease

## 2019-10-25 ENCOUNTER — Other Ambulatory Visit: Payer: Self-pay

## 2019-10-25 ENCOUNTER — Other Ambulatory Visit: Payer: Self-pay | Admitting: Physician Assistant

## 2019-10-25 ENCOUNTER — Encounter (HOSPITAL_COMMUNITY): Payer: Self-pay | Admitting: Cardiovascular Disease

## 2019-10-25 ENCOUNTER — Encounter (HOSPITAL_COMMUNITY)
Admission: RE | Disposition: A | Discharge status: Home / Self Care | Payer: Self-pay | Source: Home / Self Care | Attending: Cardiovascular Disease

## 2019-10-25 ENCOUNTER — Inpatient Hospital Stay (HOSPITAL_COMMUNITY)
Admission: RE | Admit: 2019-10-25 | Discharge: 2019-10-26 | DRG: 267 | Disposition: A | Discharge status: Home / Self Care | Payer: Medicare PPO | Attending: Cardiovascular Disease | Admitting: Cardiovascular Disease

## 2019-10-25 DIAGNOSIS — I350 Nonrheumatic aortic (valve) stenosis: Principal | ICD-10-CM | POA: Diagnosis present

## 2019-10-25 DIAGNOSIS — H903 Sensorineural hearing loss, bilateral: Secondary | ICD-10-CM | POA: Diagnosis present

## 2019-10-25 DIAGNOSIS — I10 Essential (primary) hypertension: Secondary | ICD-10-CM | POA: Diagnosis not present

## 2019-10-25 DIAGNOSIS — Z87820 Personal history of traumatic brain injury: Secondary | ICD-10-CM | POA: Diagnosis not present

## 2019-10-25 DIAGNOSIS — D696 Thrombocytopenia, unspecified: Secondary | ICD-10-CM | POA: Diagnosis present

## 2019-10-25 DIAGNOSIS — Z808 Family history of malignant neoplasm of other organs or systems: Secondary | ICD-10-CM

## 2019-10-25 DIAGNOSIS — Z952 Presence of prosthetic heart valve: Secondary | ICD-10-CM

## 2019-10-25 DIAGNOSIS — I4430 Unspecified atrioventricular block: Secondary | ICD-10-CM | POA: Diagnosis not present

## 2019-10-25 DIAGNOSIS — C61 Malignant neoplasm of prostate: Secondary | ICD-10-CM | POA: Diagnosis present

## 2019-10-25 DIAGNOSIS — C9000 Multiple myeloma not having achieved remission: Secondary | ICD-10-CM | POA: Diagnosis present

## 2019-10-25 DIAGNOSIS — D61810 Antineoplastic chemotherapy induced pancytopenia: Secondary | ICD-10-CM | POA: Diagnosis present

## 2019-10-25 DIAGNOSIS — Z006 Encounter for examination for normal comparison and control in clinical research program: Secondary | ICD-10-CM

## 2019-10-25 DIAGNOSIS — Z7901 Long term (current) use of anticoagulants: Secondary | ICD-10-CM | POA: Diagnosis not present

## 2019-10-25 DIAGNOSIS — D801 Nonfamilial hypogammaglobulinemia: Secondary | ICD-10-CM | POA: Diagnosis not present

## 2019-10-25 DIAGNOSIS — I609 Nontraumatic subarachnoid hemorrhage, unspecified: Secondary | ICD-10-CM

## 2019-10-25 DIAGNOSIS — I2584 Coronary atherosclerosis due to calcified coronary lesion: Secondary | ICD-10-CM | POA: Diagnosis present

## 2019-10-25 DIAGNOSIS — I480 Paroxysmal atrial fibrillation: Secondary | ICD-10-CM | POA: Diagnosis not present

## 2019-10-25 DIAGNOSIS — Z91030 Bee allergy status: Secondary | ICD-10-CM

## 2019-10-25 DIAGNOSIS — D6959 Other secondary thrombocytopenia: Secondary | ICD-10-CM | POA: Diagnosis not present

## 2019-10-25 DIAGNOSIS — Z8546 Personal history of malignant neoplasm of prostate: Secondary | ICD-10-CM | POA: Diagnosis not present

## 2019-10-25 DIAGNOSIS — Z79899 Other long term (current) drug therapy: Secondary | ICD-10-CM | POA: Diagnosis not present

## 2019-10-25 DIAGNOSIS — Z86711 Personal history of pulmonary embolism: Secondary | ICD-10-CM | POA: Diagnosis not present

## 2019-10-25 DIAGNOSIS — Z923 Personal history of irradiation: Secondary | ICD-10-CM

## 2019-10-25 DIAGNOSIS — S066XAA Traumatic subarachnoid hemorrhage with loss of consciousness status unknown, initial encounter: Secondary | ICD-10-CM | POA: Diagnosis present

## 2019-10-25 DIAGNOSIS — I2510 Atherosclerotic heart disease of native coronary artery without angina pectoris: Secondary | ICD-10-CM | POA: Diagnosis present

## 2019-10-25 DIAGNOSIS — G620 Drug-induced polyneuropathy: Secondary | ICD-10-CM | POA: Diagnosis present

## 2019-10-25 DIAGNOSIS — I25118 Atherosclerotic heart disease of native coronary artery with other forms of angina pectoris: Secondary | ICD-10-CM | POA: Diagnosis not present

## 2019-10-25 DIAGNOSIS — T8203XA Leakage of heart valve prosthesis, initial encounter: Secondary | ICD-10-CM | POA: Diagnosis not present

## 2019-10-25 HISTORY — PX: TEE WITHOUT CARDIOVERSION: SHX5443

## 2019-10-25 HISTORY — DX: Paroxysmal atrial fibrillation: I48.0

## 2019-10-25 HISTORY — PX: TRANSCATHETER AORTIC VALVE REPLACEMENT, TRANSFEMORAL: SHX6400

## 2019-10-25 HISTORY — DX: Presence of prosthetic heart valve: Z95.2

## 2019-10-25 LAB — POTASSIUM: Potassium: 3.7 mmol/L (ref 3.5–5.1)

## 2019-10-25 LAB — POCT I-STAT, CHEM 8
BUN: 8 mg/dL (ref 8–23)
Calcium, Ion: 1.18 mmol/L (ref 1.15–1.40)
Chloride: 107 mmol/L (ref 98–111)
Creatinine, Ser: 0.6 mg/dL — ABNORMAL LOW (ref 0.61–1.24)
Glucose, Bld: 101 mg/dL — ABNORMAL HIGH (ref 70–99)
HCT: 34 % — ABNORMAL LOW (ref 39.0–52.0)
Hemoglobin: 11.6 g/dL — ABNORMAL LOW (ref 13.0–17.0)
Potassium: 4.1 mmol/L (ref 3.5–5.1)
Sodium: 143 mmol/L (ref 135–145)
TCO2: 23 mmol/L (ref 22–32)

## 2019-10-25 LAB — ECHOCARDIOGRAM LIMITED
AR max vel: 1.6 cm2
AV Area VTI: 1.28 cm2
AV Area mean vel: 1.55 cm2
AV Mean grad: 14 mmHg
AV Peak grad: 9.4 mmHg
Ao pk vel: 1.53 m/s

## 2019-10-25 LAB — TYPE AND SCREEN
ABO/RH(D): O POS
Antibody Screen: POSITIVE

## 2019-10-25 SURGERY — IMPLANTATION, AORTIC VALVE, TRANSCATHETER, FEMORAL APPROACH
Anesthesia: Monitor Anesthesia Care

## 2019-10-25 MED ORDER — IOHEXOL 350 MG/ML SOLN
INTRAVENOUS | Status: AC
  Filled 2019-10-25: qty 1

## 2019-10-25 MED ORDER — SODIUM CHLORIDE 0.9 % IV SOLN
1.5000 g | Freq: Two times a day (BID) | INTRAVENOUS | Status: DC
  Administered 2019-10-25 – 2019-10-26 (×2): 1.5 g via INTRAVENOUS
  Filled 2019-10-25 (×4): qty 1.5

## 2019-10-25 MED ORDER — LIDOCAINE HCL (PF) 1 % IJ SOLN
INTRAMUSCULAR | Status: DC | PRN
  Administered 2019-10-25: 10 mL via INTRADERMAL
  Administered 2019-10-25: 2 mL
  Administered 2019-10-25: 10 mL via INTRADERMAL

## 2019-10-25 MED ORDER — PROPOFOL 10 MG/ML IV BOLUS
INTRAVENOUS | Status: DC | PRN
  Administered 2019-10-25: 20 mg via INTRAVENOUS

## 2019-10-25 MED ORDER — ASPIRIN 81 MG PO CHEW
81.0000 mg | CHEWABLE_TABLET | Freq: Every day | ORAL | Status: DC
  Administered 2019-10-26: 81 mg via ORAL
  Filled 2019-10-25: qty 1

## 2019-10-25 MED ORDER — SODIUM CHLORIDE 0.9% FLUSH: 3.0000 mL | INTRAVENOUS | Status: DC | PRN

## 2019-10-25 MED ORDER — HEPARIN SODIUM (PORCINE) 1000 UNIT/ML IJ SOLN
INTRAMUSCULAR | Status: DC | PRN
  Administered 2019-10-25: 13000 [IU] via INTRAVENOUS

## 2019-10-25 MED ORDER — VERAPAMIL HCL 2.5 MG/ML IV SOLN
INTRAVENOUS | Status: DC | PRN
  Administered 2019-10-25: 10 mL via INTRA_ARTERIAL

## 2019-10-25 MED ORDER — TRAMADOL HCL 50 MG PO TABS: 50.0000 mg | ORAL_TABLET | ORAL | Status: DC | PRN

## 2019-10-25 MED ORDER — CHLORHEXIDINE GLUCONATE 4 % EX LIQD
60.0000 mL | Freq: Once | CUTANEOUS | Status: DC
  Filled 2019-10-25: qty 60

## 2019-10-25 MED ORDER — HEPARIN (PORCINE) IN NACL 1000-0.9 UT/500ML-% IV SOLN
INTRAVENOUS | Status: AC
  Filled 2019-10-25: qty 1000

## 2019-10-25 MED ORDER — ACETAMINOPHEN 650 MG RE SUPP: 650.0000 mg | Freq: Four times a day (QID) | RECTAL | Status: DC | PRN

## 2019-10-25 MED ORDER — PHENYLEPHRINE HCL-NACL 20-0.9 MG/250ML-% IV SOLN
0.0000 ug/min | INTRAVENOUS | Status: DC
  Filled 2019-10-25: qty 250

## 2019-10-25 MED ORDER — CHLORHEXIDINE GLUCONATE 4 % EX LIQD
30.0000 mL | CUTANEOUS | Status: DC
  Filled 2019-10-25: qty 30

## 2019-10-25 MED ORDER — LIDOCAINE HCL (PF) 1 % IJ SOLN
INTRAMUSCULAR | Status: AC
  Filled 2019-10-25: qty 30

## 2019-10-25 MED ORDER — VERAPAMIL HCL 2.5 MG/ML IV SOLN
INTRAVENOUS | Status: AC
  Filled 2019-10-25: qty 2

## 2019-10-25 MED ORDER — PROTAMINE SULFATE 10 MG/ML IV SOLN
INTRAVENOUS | Status: DC | PRN
  Administered 2019-10-25: 130 mg via INTRAVENOUS

## 2019-10-25 MED ORDER — IOHEXOL 350 MG/ML SOLN
INTRAVENOUS | Status: DC | PRN
  Administered 2019-10-25: 80 mL

## 2019-10-25 MED ORDER — FENTANYL CITRATE (PF) 100 MCG/2ML IJ SOLN
INTRAMUSCULAR | Status: DC | PRN
Start: 2019-10-25 — End: 2019-10-25
  Administered 2019-10-25: 50 ug via INTRAVENOUS

## 2019-10-25 MED ORDER — NITROGLYCERIN IN D5W 200-5 MCG/ML-% IV SOLN
0.0000 ug/min | INTRAVENOUS | Status: DC
  Administered 2019-10-25: 20 ug/min via INTRAVENOUS
  Filled 2019-10-25: qty 250

## 2019-10-25 MED ORDER — OXYCODONE HCL 5 MG PO TABS: 5.0000 mg | ORAL_TABLET | ORAL | Status: DC | PRN

## 2019-10-25 MED ORDER — SODIUM CHLORIDE 0.9 % IV SOLN: 250.0000 mL | INTRAVENOUS | Status: DC | PRN

## 2019-10-25 MED ORDER — ONDANSETRON HCL 4 MG/2ML IJ SOLN: 4.0000 mg | Freq: Four times a day (QID) | INTRAMUSCULAR | Status: DC | PRN

## 2019-10-25 MED ORDER — CHLORHEXIDINE GLUCONATE 0.12 % MT SOLN
15.0000 mL | Freq: Once | OROMUCOSAL | Status: AC
  Filled 2019-10-25: qty 15

## 2019-10-25 MED ORDER — SODIUM CHLORIDE 0.9% FLUSH
3.0000 mL | Freq: Two times a day (BID) | INTRAVENOUS | Status: DC
  Administered 2019-10-26: 3 mL via INTRAVENOUS

## 2019-10-25 MED ORDER — LACTATED RINGERS IV SOLN: INTRAVENOUS | Status: DC

## 2019-10-25 MED ORDER — PHENYLEPHRINE HCL-NACL 20-0.9 MG/250ML-% IV SOLN
INTRAVENOUS | Status: DC | PRN
  Administered 2019-10-25: 20 ug/min via INTRAVENOUS

## 2019-10-25 MED ORDER — CHLORHEXIDINE GLUCONATE 0.12 % MT SOLN
OROMUCOSAL | Status: AC
  Administered 2019-10-25: 15 mL via OROMUCOSAL
  Filled 2019-10-25: qty 15

## 2019-10-25 MED ORDER — HEPARIN (PORCINE) IN NACL 1000-0.9 UT/500ML-% IV SOLN
INTRAVENOUS | Status: DC | PRN
  Administered 2019-10-25 (×3): 500 mL

## 2019-10-25 MED ORDER — PROPOFOL 500 MG/50ML IV EMUL
INTRAVENOUS | Status: DC | PRN
  Administered 2019-10-25: 20 ug/kg/min via INTRAVENOUS

## 2019-10-25 MED ORDER — SODIUM CHLORIDE 0.9 % IV SOLN: INTRAVENOUS | Status: DC

## 2019-10-25 MED ORDER — MELATONIN 3 MG PO TABS
3.0000 mg | ORAL_TABLET | Freq: Every day | ORAL | Status: DC
  Administered 2019-10-25: 3 mg via ORAL
  Filled 2019-10-25: qty 1

## 2019-10-25 MED ORDER — SODIUM CHLORIDE 0.9 % IV SOLN: INTRAVENOUS | Status: AC

## 2019-10-25 MED ORDER — MORPHINE SULFATE (PF) 2 MG/ML IV SOLN: 1.0000 mg | INTRAVENOUS | Status: DC | PRN

## 2019-10-25 MED ORDER — VANCOMYCIN HCL IN DEXTROSE 1-5 GM/200ML-% IV SOLN
1000.0000 mg | Freq: Once | INTRAVENOUS | Status: AC
  Administered 2019-10-25: 1000 mg via INTRAVENOUS
  Filled 2019-10-25: qty 200

## 2019-10-25 MED ORDER — ACETAMINOPHEN 325 MG PO TABS: 650.0000 mg | ORAL_TABLET | Freq: Four times a day (QID) | ORAL | Status: DC | PRN

## 2019-10-25 SURGICAL SUPPLY — 44 items
BAG SNAP BAND KOVER 36X36 (MISCELLANEOUS) ×6 IMPLANT
BLANKET WARM UNDERBOD FULL ACC (MISCELLANEOUS) ×3 IMPLANT
CABLE ADAPT PACING TEMP 12FT (ADAPTER) ×3 IMPLANT
CABLE SURGICAL S-101-97-12 (CABLE) ×3 IMPLANT
CATH 29 EDWARDS DELIVERY SYS (CATHETERS) ×3 IMPLANT
CATH DIAG 6FR PIGTAIL ANGLED (CATHETERS) ×6 IMPLANT
CATH INFINITI 6F AL2 (CATHETERS) ×3 IMPLANT
CATH INFINITI 6F MPA2 100CM (CATHETERS) ×3 IMPLANT
CATH S G BIP PACING (CATHETERS) ×3 IMPLANT
CATH TEMPO TEMP PACE LEAD (CATHETERS) ×1 IMPLANT
CATHETER TEMPO TEMP PACE LEAD (CATHETERS) ×3
CLOSURE MYNX CONTROL 6F/7F (Vascular Products) ×3 IMPLANT
CRIMPER (MISCELLANEOUS) ×3 IMPLANT
DEVICE CLOSURE PERCLS PRGLD 6F (VASCULAR PRODUCTS) ×2 IMPLANT
DEVICE INFLATION ATRION QL38 (MISCELLANEOUS) ×3 IMPLANT
DEVICE RAD COMP TR BAND LRG (VASCULAR PRODUCTS) ×3 IMPLANT
ELECT DEFIB PAD ADLT CADENCE (PAD) ×3 IMPLANT
FILTER CEREBRAL PROT SENTINEL (FILTER) ×3 IMPLANT
GLIDESHEATH SLENDER 7FR .021G (SHEATH) ×3 IMPLANT
GUIDEWIRE SAFE TJ AMPLATZ EXST (WIRE) ×3 IMPLANT
KIT HEART LEFT (KITS) ×3 IMPLANT
KIT MICROPUNCTURE NIT STIFF (SHEATH) ×3 IMPLANT
PACK CARDIAC CATHETERIZATION (CUSTOM PROCEDURE TRAY) ×3 IMPLANT
PERCLOSE PROGLIDE 6F (VASCULAR PRODUCTS) ×6
SHEATH 16X36 EDWARDS (SHEATH) ×3 IMPLANT
SHEATH BRITE TIP 7FR 35CM (SHEATH) ×3 IMPLANT
SHEATH FLEXOR ANSEL 1 7F 45CM (SHEATH) ×3 IMPLANT
SHEATH HIGHFLEX ANSEL 7FR 55CM (SHEATH) ×3 IMPLANT
SHEATH PINNACLE 6F 10CM (SHEATH) ×3 IMPLANT
SHEATH PINNACLE 8F 10CM (SHEATH) ×3 IMPLANT
SHEATH PROBE COVER 6X72 (BAG) ×3 IMPLANT
SLEEVE REPOSITIONING LENGTH 30 (MISCELLANEOUS) ×3 IMPLANT
STOPCOCK MORSE 400PSI 3WAY (MISCELLANEOUS) ×6 IMPLANT
TRANSDUCER W/STOPCOCK (MISCELLANEOUS) ×6 IMPLANT
TUBE CONN 8.8X1320 FR HP M-F (CONNECTOR) ×3 IMPLANT
TUBING CIL FLEX 10 FLL-RA (TUBING) ×3 IMPLANT
VALVE HEART TRANSCATH SZ3 29MM (Valve) ×3 IMPLANT
WIRE AMPLATZ SS-J .035X180CM (WIRE) ×3 IMPLANT
WIRE ASAHI GRAND SLAM 180CM (WIRE) ×3 IMPLANT
WIRE EMERALD 3MM-J .035X150CM (WIRE) ×3 IMPLANT
WIRE EMERALD 3MM-J .035X260CM (WIRE) ×3 IMPLANT
WIRE EMERALD ST .035X260CM (WIRE) ×3 IMPLANT
WIRE HI TORQ VERSACORE-J 145CM (WIRE) ×3 IMPLANT
WIRE MAILMAN 300CM (WIRE) ×3 IMPLANT

## 2019-10-25 NOTE — Anesthesia Postprocedure Evaluation (Signed)
Anesthesia Post Note  Patient: Mitchell Lowery  Procedure(s) Performed: TRANSCATHETER AORTIC VALVE REPLACEMENT, TRANSFEMORAL (N/A ) TRANSESOPHAGEAL ECHOCARDIOGRAM (TEE) (N/A )     Patient location during evaluation: Cath Lab Anesthesia Type: MAC Level of consciousness: awake and alert Pain management: pain level controlled Vital Signs Assessment: post-procedure vital signs reviewed and stable Respiratory status: spontaneous breathing, nonlabored ventilation and respiratory function stable Cardiovascular status: stable, blood pressure returned to baseline and bradycardic Anesthetic complications: no   No complications documented.  Last Vitals:  Vitals:   10/25/19 1508 10/25/19 1553  BP: (!) 106/51 (!) 109/54  Pulse: 60 (!) 55  Resp: 14 15  Temp: 36.5 C (!) 36.4 C  SpO2:      Last Pain:  Vitals:   10/25/19 1553  TempSrc: Temporal  PainSc: 0-No pain                 Audry Pili

## 2019-10-25 NOTE — Progress Notes (Signed)
  [place] [facility]VE TEAM  Patient doing well s/p TAVR. He is hemodynamically stable. Groin sites stable. ECG with sinus and new 1st deg AV block but no high grade block. Plan to DC arterial line and transfer to 4E. Plan for early ambulation after bedrest completed and hopeful discharge over the next 24-48 hours.   Angelena Form PA-C  [facility]  Pager [phone]

## 2019-10-25 NOTE — Anesthesia Procedure Notes (Signed)
Arterial Line Insertion Start/End9/28/2021 10:27 AM, 10/25/2019 10:27 AM Performed by: Renato Shin, CRNA, CRNA  Patient location: Pre-op. Preanesthetic checklist: patient identified, IV checked, site marked, risks and benefits discussed, surgical consent, monitors and equipment checked, pre-op evaluation, timeout performed and anesthesia consent Lidocaine 1% used for infiltration Left, radial was placed Catheter size: 20 G Hand hygiene performed  and maximum sterile barriers used  Allen's test indicative of satisfactory collateral circulation Attempts: 1 Procedure performed without using ultrasound guided technique. Following insertion, dressing applied and Biopatch. Post procedure assessment: normal  Patient tolerated the procedure well with no immediate complications.

## 2019-10-25 NOTE — Progress Notes (Signed)
  Echocardiogram 2D Echocardiogram has been performed.  Mitchell Lowery 10/25/2019, 2:35 PM

## 2019-10-25 NOTE — Progress Notes (Signed)
Called Dr. Paticia Stack to advise since his previous conversation with patient's nurse Catalina Antigua that patient had gotten up to go to the bathroom and developed a significant groin bleed with a small hematoma. Pressure being held to stop bleeding. Md advised ok to cancel previous verbal order for po hydralizine and use prn Nitroglycerine gtt for blood pressure management. Start at 20 mcg and titrate with a goal of less than 140/90. Started nitro gtt per orders. Frequent blood pressures being taken currently 150/68 after nitro started.

## 2019-10-25 NOTE — Addendum Note (Signed)
Addendum  created 10/25/19 1828 by Amadeo Garnet, CRNA   Intraprocedure Event edited

## 2019-10-25 NOTE — Progress Notes (Signed)
Handoff given to Luane School, CRNA prior to patient leaving short stay.

## 2019-10-25 NOTE — Discharge Instructions (Signed)
ACTIVITY AND EXERCISE °• Daily activity and exercise are an important part of your recovery. People recover at different rates depending on their general health and type of valve procedure. °• Most people recovering from TAVR feel better relatively quickly  °• No lifting, pushing, pulling more than 10 pounds (examples to avoid: groceries, vacuuming, gardening, golfing): °            - For one week with a procedure through the groin. °            - For six weeks for procedures through the chest wall or neck. °NOTE: You will typically see one of our providers 7-14 days after your procedure to discuss WHEN TO RESUME the above activities.  °  °  °DRIVING °• Do not drive until you are seen for follow up and cleared by a provider. Generally, we ask patient to not drive for 1 week after their procedure. °• If you have been told by your doctor in the past that you may not drive, you must talk with him/her before you begin driving again. °  °DRESSING °• Groin site: you may leave the clear dressing over the site for up to one week or until it falls off. °  °HYGIENE °• If you had a femoral (leg) procedure, you may take a shower when you return home. After the shower, pat the site dry. Do NOT use powder, oils or lotions in your groin area until the site has completely healed. °• If you had a chest procedure, you may shower when you return home unless specifically instructed not to by your discharging practitioner. °            - DO NOT scrub incision; pat dry with a towel. °            - DO NOT apply any lotions, oils, powders to the incision. °            - No tub baths / swimming for at least 2 weeks. °• If you notice any fevers, chills, increased pain, swelling, bleeding or pus, please contact your doctor. °  °ADDITIONAL INFORMATION °• If you are going to have an upcoming dental procedure, please contact our office as you will require antibiotics ahead of time to prevent infection on your heart valve.  ° ° °If you have any  questions or concerns you can call the structural heart phone during normal business hours 8am-4pm. If you have an urgent need after hours or weekends please call [phone] to talk to the on call provider for general cardiology. If you have an emergency that requires immediate attention, please call 911.  ° ° °After TAVR Checklist ° °Check  Test Description  ° Follow up appointment in 1-2 weeks  You will see our structural heart physician assistant, Mitchell Lowery. Your incision sites will be checked and you will be cleared to drive and resume all normal activities if you are doing well.    ° 1 month echo and follow up  You will have an echo to check on your new heart valve and be seen back in the office by Mitchell Lowery. Many times the echo is not read by your appointment time, but Mitchell will call you later that day or the following day to report your results.  ° Follow up with your primary cardiologist You will need to be seen by your primary cardiologist in the following 3-6 months after your 1 month appointment in the valve   clinic. Often times your Plavix or Aspirin will be discontinued during this time, but this is decided on a case by case basis.   ° 1 year echo and follow up You will have another echo to check on your heart valve after 1 year and be seen back in the office by Mitchell Lowery. This your last structural heart visit.  ° Bacterial endocarditis prophylaxis  You will have to take antibiotics for the rest of your life before all dental procedures (even teeth cleanings) to protect your heart valve. Antibiotics are also required before some surgeries. Please check with your cardiologist before scheduling any surgeries. Also, please make sure to tell us if you have a penicillin allergy as you will require an alternative antibiotic.   ° ° °

## 2019-10-25 NOTE — Interval H&P Note (Signed)
History and Physical Interval Note:  10/25/2019 9:54 AM  Mitchell Lowery  has presented today for surgery, with the diagnosis of Severe Aortic Stenosis.  The various methods of treatment have been discussed with the patient and family. After consideration of risks, benefits and other options for treatment, the patient has consented to  Procedure(s): TRANSCATHETER AORTIC VALVE REPLACEMENT, TRANSFEMORAL (N/A) TRANSESOPHAGEAL ECHOCARDIOGRAM (TEE) (N/A) as a surgical intervention.  The patient's history has been reviewed, patient examined, no change in status, stable for surgery.  I have reviewed the patient's chart and labs.  Questions were answered to the patient's satisfaction.     [name]

## 2019-10-25 NOTE — Progress Notes (Signed)
L radial  20 g arterial line was removed, and manual pressure was held for 10 min. L radial pulse is moderate, with capillary reffill of < 3 sec.

## 2019-10-25 NOTE — Progress Notes (Signed)
@  2025 Dr. Paticia Stack, on-call for attending, paged at pt's request to ask for home sleep aid. Page promptly returned and order received for PO Melatonin.  @2140  Dr. Paticia Stack paged regarding pt's SBPs 160s-170s (MAPs 90s). Page promptly returned and PO Hydralazine order given.  @2200  pt ambulated. Immediately prior to ambulation, BL Groins and R Radial sites all Level 0. Post ambulation, while entering bathroom, pt noted to be dripping blood from groin. Pt immediately returned to bed and Level 2 Hematoma actively bleeding noted at L Groin (R Radial and R Groin still Level 0). Pressure applied to site for 30 minutes and Dr. Paticia Stack concurrently called by Charge RN (see additional note). Nitro gtt ordered and commenced in place of PO Hydralazine. Bedrest restarted and frequent vitals implemented.  @2300  site remains is now a Level 0. Will continue to monitor and assess.

## 2019-10-25 NOTE — Op Note (Signed)
[facility]   MULTIDISCIPLINARY HEART VALVE TEAM   TAVR OPERATIVE NOTE   Date of Procedure:  10/25/2019  Preoperative Diagnosis: Severe Aortic Stenosis   Postoperative Diagnosis: Same   Procedure:    Transcatheter Aortic Valve Replacement - Percutaneous Transfemoral Approach  Edwards Sapien 3 THV (size 29 mm, model # 9600TFX, serial # [id number])   Co-Surgeons:  [name], MD and Sherren Mocha, MD  Anesthesiologist:  Renold Don, MD  Echocardiographer:  Ena Dawley, MD  Pre-operative Echo Findings:  Severe aortic stenosis  Normal left ventricular systolic function  Post-operative Echo Findings:  Mild paravalvular leak  Normal left ventricular systolic function  BRIEF CLINICAL NOTE AND INDICATIONS FOR SURGERY  The patient is an 85 year old gentleman with paroxysmal atrial fibrillation, coronary artery disease, and progressive now severe aortic stenosis associated with CCS functional class II symptoms of exertional angina. His recent echocardiogram demonstrated a mean transaortic gradient of 40 mmHg, peak gradient 68 mmHg, calculated valve area 0.75 cm, and dimensionless index 0.24.  During the course of the patient's preoperative work up they have been evaluated comprehensively by a multidisciplinary team of specialists coordinated through the [facility] in the [facility].  They have been demonstrated to suffer from symptomatic severe aortic stenosis as noted above. The patient has been counseled extensively as to the relative risks and benefits of all options for the treatment of severe aortic stenosis including long term medical therapy, conventional surgery for aortic valve replacement, and transcatheter aortic valve replacement.  The patient has been independently evaluated in formal cardiac surgical consultation by Dr Cyndia Bent, who deemed the patient appropriate for TAVR. Based upon review of all  of the patient's preoperative diagnostic tests they are felt to be candidate for transcatheter aortic valve replacement using the transfemoral approach as an alternative to conventional surgery.    Following the decision to proceed with transcatheter aortic valve replacement, a discussion has been held regarding what types of management strategies would be attempted intraoperatively in the event of life-threatening complications, including whether or not the patient would be considered a candidate for the use of cardiopulmonary bypass and/or conversion to open sternotomy for attempted surgical intervention.  The patient has been advised of a variety of complications that might develop peculiar to this approach including but not limited to risks of death, stroke, paravalvular leak, aortic dissection or other major vascular complications, aortic annulus rupture, device embolization, cardiac rupture or perforation, acute myocardial infarction, arrhythmia, heart block or bradycardia requiring permanent pacemaker placement, congestive heart failure, respiratory failure, renal failure, pneumonia, infection, other late complications related to structural valve deterioration or migration, or other complications that might ultimately cause a temporary or permanent loss of functional independence or other long term morbidity.  The patient provides full informed consent for the procedure as described and all questions were answered preoperatively.  DETAILS OF THE OPERATIVE PROCEDURE  PREPARATION:   The patient is brought to the operating room on the above mentioned date and central monitoring was established by the anesthesia team including placement of a central venous catheter and radial arterial line. The patient is placed in the supine position on the operating table.  Intravenous antibiotics are administered. The patient is monitored closely throughout the procedure under conscious sedation.  Baseline  transthoracic echocardiogram is performed. The patient's chest, abdomen, both groins, and both lower extremities are prepared and draped in a sterile manner. A time out procedure is performed.  SENTINEL EMBOLIC PROTECTION  DEVICE DEPLOYMENT Using a modified Seldinger technique, the right wrist is anesthetized with 1% lidocaine and a 6/7 French slender sheath is inserted into the right radial artery. 3 mg of verapamil was administered through the sheath. Once the patient is fully heparinized, and 180 cm grand slam wire is advanced into the aortic root and the sentinel catheter is advanced over the wire until it is in appropriate position in the innominate artery. The innominate filter is deployed using normal technique. The catheter tip is then deflected so that it approaches the left common carotid artery. The left common carotid artery is wired with the grand slam wire. The left common carotid filter is deployed without difficulty.  PERIPHERAL ACCESS:   Using ultrasound guidance, femoral arterial and venous access is obtained with placement of 6 Fr sheaths on the left side.  A pigtail diagnostic catheter was passed through the femoral arterial sheath under fluoroscopic guidance into the aortic root.  A temporary transvenous pacemaker catheter was passed through the femoral venous sheath under fluoroscopic guidance. The pacemaker wires extremely difficult to place. A prolonged effort is made but did continue to curl in the right atrium which was massively dilated. Initially, the long 7 [place] sheath is changed out for a 7 [place] angled Ansel sheath. Even with this sheath placed in the right atrium, I could not get enough support to advance the pacemaker wire into the right ventricle. Both of 45 cm and a 55 cm sheath were used. We could not find any point of contact for consistent pacemaker capture. We elected to move forward with the procedure with plans to paced from the LV wire. Aortic root angiography was  performed in order to determine the optimal angiographic angle for valve deployment.  TRANSFEMORAL ACCESS:  A micropuncture technique is used to access the right femoral artery under fluoroscopic and ultrasound guidance.  2 Perclose devices are deployed at 10' and 2' positions to 'PreClose' the femoral artery. An 8 French sheath is placed and then an Amplatz Superstiff wire is advanced through the sheath. This is changed out for a 16 French transfemoral E-Sheath after progressively dilating over the Superstiff wire.  An AL 2 catheter was used to direct a straight-tip exchange length wire across the native aortic valve into the left ventricle. This was exchanged out for a pigtail catheter and position was confirmed in the LV apex. Simultaneous LV and Ao pressures were recorded.  The pigtail catheter was exchanged for an Amplatz Extra-stiff wire in the LV apex. Alligator clips are applied to the end of the wire and to a small needle in the left groin in order to obtain a ground. We obtained good pacemaker capture at an output of 20 mA.  BALLOON AORTIC VALVULOPLASTY:  Not performed  TRANSCATHETER HEART VALVE DEPLOYMENT:  An Edwards Sapien 3 transcatheter heart valve (size 29 mm) was prepared and crimped per manufacturer's guidelines, and the proper orientation of the valve is confirmed on the [employer] delivery system. The valve was advanced through the introducer sheath using normal technique until in an appropriate position in the abdominal aorta beyond the sheath tip. The balloon was then retracted and using the fine-tuning wheel was centered on the valve. The valve was then advanced across the aortic arch using appropriate flexion of the catheter. There was some interaction with the valve and the sentinel embolic protection device when crossing the arch. We elected to recapture the left common carotid filter in order to allow the valve to pass the  device without difficulty. The valve was carefully  positioned across the aortic valve annulus. The Commander catheter was retracted using normal technique. Once final position of the valve has been confirmed by angiographic assessment, the valve is deployed while temporarily holding ventilation and during rapid ventricular pacing to maintain systolic blood pressure < 50 mmHg and pulse pressure < 10 mmHg. The balloon inflation is held for >3 seconds after reaching full deployment volume. Once the balloon has fully deflated the balloon is retracted into the ascending aorta and valve function is assessed using echocardiography. The patient's hemodynamic recovery following valve deployment is good.  The deployment balloon and guidewire are both removed. Echo demostrated acceptable post-procedural gradients, stable mitral valve function, and mild aortic insufficiency.    PROCEDURE COMPLETION:  The sheath was removed and femoral artery closure is performed using the 2 previously deployed Perclose devices.  Protamine is administered once femoral arterial repair was complete. The site is clear with no evidence of bleeding or hematoma after the sutures are tightened. The temporary pacemaker and pigtail catheters are removed. Mynx closure is used for contralateral femoral arterial hemostasis for the 6 Fr sheath.  The patient tolerated the procedure well and is transported to the recovery area in stable condition. There were no immediate intraoperative complications. All sponge instrument and needle counts are verified correct at completion of the operation.   The patient received a total of 80 mL of intravenous contrast during the procedure.   Sherren Mocha, MD 10/25/2019 6:03 PM

## 2019-10-25 NOTE — Transfer of Care (Signed)
Immediate Anesthesia Transfer of Care Note  Patient: Mitchell Lowery  Procedure(s) Performed: TRANSCATHETER AORTIC VALVE REPLACEMENT, TRANSFEMORAL (N/A ) TRANSESOPHAGEAL ECHOCARDIOGRAM (TEE) (N/A )  Patient Location: Cath Lab  Anesthesia Type:MAC  Level of Consciousness: awake, alert  and oriented  Airway & Oxygen Therapy: Patient Spontanous Breathing  Post-op Assessment: Report given to RN, Post -op Vital signs reviewed and stable and Patient moving all extremities  Post vital signs: Reviewed and stable  Last Vitals:  Vitals Value Taken Time  BP 106/51 10/25/19 1502  Temp    Pulse 64 10/25/19 1505  Resp 20 10/25/19 1505  SpO2 94 % 10/25/19 1505  Vitals shown include unvalidated device data.  Last Pain:  Vitals:   10/25/19 1011  TempSrc:   PainSc: 0-No pain         Complications: No complications documented.

## 2019-10-26 ENCOUNTER — Other Ambulatory Visit: Payer: Self-pay

## 2019-10-26 ENCOUNTER — Inpatient Hospital Stay (HOSPITAL_COMMUNITY): Payer: Medicare PPO

## 2019-10-26 ENCOUNTER — Encounter (HOSPITAL_COMMUNITY): Payer: Self-pay | Admitting: Cardiovascular Disease

## 2019-10-26 DIAGNOSIS — Z952 Presence of prosthetic heart valve: Secondary | ICD-10-CM

## 2019-10-26 LAB — BASIC METABOLIC PANEL
Anion gap: 12 (ref 5–15)
BUN: 10 mg/dL (ref 8–23)
CO2: 20 mmol/L — ABNORMAL LOW (ref 22–32)
Calcium: 8.2 mg/dL — ABNORMAL LOW (ref 8.9–10.3)
Chloride: 109 mmol/L (ref 98–111)
Creatinine, Ser: 0.74 mg/dL (ref 0.61–1.24)
GFR calc Af Amer: >60 mL/min (ref >60)
GFR calc non Af Amer: >60 mL/min (ref >60)
Glucose, Bld: 110 mg/dL — ABNORMAL HIGH (ref 70–99)
Potassium: 4 mmol/L (ref 3.5–5.1)
Sodium: 141 mmol/L (ref 135–145)

## 2019-10-26 LAB — POCT I-STAT, CHEM 8
BUN: 8 mg/dL (ref 8–23)
Calcium, Ion: 1.25 mmol/L (ref 1.15–1.40)
Chloride: 105 mmol/L (ref 98–111)
Creatinine, Ser: 0.6 mg/dL — ABNORMAL LOW (ref 0.61–1.24)
Glucose, Bld: 105 mg/dL — ABNORMAL HIGH (ref 70–99)
HCT: 35 % — ABNORMAL LOW (ref 39.0–52.0)
Hemoglobin: 11.9 g/dL — ABNORMAL LOW (ref 13.0–17.0)
Potassium: 3.9 mmol/L (ref 3.5–5.1)
Sodium: 143 mmol/L (ref 135–145)
TCO2: 24 mmol/L (ref 22–32)

## 2019-10-26 LAB — CBC
HCT: 37.8 % — ABNORMAL LOW (ref 39.0–52.0)
Hemoglobin: 12.4 g/dL — ABNORMAL LOW (ref 13.0–17.0)
MCH: 31.6 pg (ref 26.0–34.0)
MCHC: 32.8 g/dL (ref 30.0–36.0)
MCV: 96.2 fL (ref 80.0–100.0)
Platelets: 60 10*3/uL — ABNORMAL LOW (ref 150–400)
RBC: 3.93 MIL/uL — ABNORMAL LOW (ref 4.22–5.81)
RDW: 12.9 % (ref 11.5–15.5)
WBC: 6.1 10*3/uL (ref 4.0–10.5)
nRBC: 0 % (ref 0.0–0.2)

## 2019-10-26 LAB — POCT ACTIVATED CLOTTING TIME: Activated Clotting Time: 142 s

## 2019-10-26 LAB — MAGNESIUM: Magnesium: 1.9 mg/dL (ref 1.7–2.4)

## 2019-10-26 MED ORDER — LOSARTAN POTASSIUM 50 MG PO TABS
50.0000 mg | ORAL_TABLET | Freq: Every evening | ORAL | Status: DC
  Administered 2019-10-26: 50 mg via ORAL
  Filled 2019-10-26: qty 1

## 2019-10-26 NOTE — Progress Notes (Signed)
  Echocardiogram 2D Echocardiogram has been performed.  Mitchell Lowery 10/26/2019, 9:31 AM
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
## 2019-10-26 NOTE — Op Note (Signed)
[facility]   MULTIDISCIPLINARY HEART VALVE TEAM   TAVR OPERATIVE NOTE   Date of Procedure:  10/26/2019  Preoperative Diagnosis: Severe Aortic Stenosis   Postoperative Diagnosis: Same   Procedure:    Transcatheter Aortic Valve Replacement - Percutaneous Right Transfemoral Approach  Edwards Sapien 3 THV (size 29 mm, model # 9600TFX, serial # [id number])   Co-Surgeons:  [name], MD and Sherren Mocha, MD   Anesthesiologist:  Raechel Ache, MD  Echocardiographer:  Liane Comber, MD  Pre-operative Echo Findings:  Severe aortic stenosis  Normal left ventricular systolic function  Post-operative Echo Findings:  mild paravalvular leak  Normal left ventricular systolic function   BRIEF CLINICAL NOTE AND INDICATIONS FOR SURGERY  This 85 year old gentleman has stage D, severe, symptomatic aortic stenosis with CCS class II symptoms of exertional angina with walking up hills as well as exertional fatigue. He is NYHA class l-ll with no shortness of breath, mild fatigue only with heavy exertion. I have personally reviewed his 2D echocardiogram, cardiac catheterization, and CTA studies. His echocardiogram shows a trileaflet aortic valve with severe calcification and restricted leaflet mobility. The mean gradient has increased over the last few years to 40 mmHg consistent with severe aortic stenosis. Left ventricular systolic function is normal with moderate LVH. His cardiac catheterization shows a mean gradient of 39 mmHg across the aortic valve. Coronary angiography shows a 75% calcified mid LAD stenosis as well as 60% first diagonal stenosis. This LAD lesion is probably hemodynamically significant but I think his severe aortic stenosis is more likely to be contributing to his symptoms. This lesion would require atherectomy and stenting over a long area in a patient with severe aortic stenosis and I think it is probably best to continue medical therapy for this coronary lesion  and replace his aortic valve. PCI and stenting in this patient would be complicated by the use of chronic oral anticoagulation for atrial fibrillation as well as thrombocytopenia in the setting of multiple myeloma and chronic chemotherapy for that. I think TAVR would be the best option for this patient given his advanced age and comorbidities including multiple myeloma. His gated cardiac CTA shows anatomy suitable for transcatheter aortic valve replacement using a SAPIEN 3 valve. His abdominal and pelvic CTA shows adequate pelvic vascular anatomy to allow transfemoral insertion.   The patient and his wife were counseled at length regarding treatment alternatives for management of severe symptomatic aortic stenosis. The risks and benefits of surgical intervention has been discussed in detail. Long-term prognosis with medical therapy was discussed. Alternative approaches such as conventional surgical aortic valve replacement, transcatheter aortic valve replacement, and palliative medical therapy were compared and contrasted at length. This discussion was placed in the context of the patient's own specific clinical presentation and past medical history. All of their questions have been addressed.  Following the decision to proceed with transcatheter aortic valve replacement, a discussion was held regarding what types of management strategies would be attempted intraoperatively in the event of life-threatening complications, including whether or not the patient would be considered a candidate for the use of cardiopulmonary bypass and/or conversion to open sternotomy for attempted surgical intervention. The patient is aware of the fact that transient use of cardiopulmonary bypass may be necessary. He is 85 years old but still very active and I think he would be a candidate for emergent sternotomy if needed to manage any intraoperative complications. The patient has been advised of a variety of complications that  might develop  including but not limited to risks of death, stroke, paravalvular leak, aortic dissection or other major vascular complications, aortic annulus rupture, device embolization, cardiac rupture or perforation, mitral regurgitation, acute myocardial infarction, arrhythmia, heart block or bradycardia requiring permanent pacemaker placement, congestive heart failure, respiratory failure, renal failure, pneumonia, infection, other late complications related to structural valve deterioration or migration, or other complications that might ultimately cause a temporary or permanent loss of functional independence or other long term morbidity. The patient provides full informed consent for the procedure as described and all questions were answered.    DETAILS OF THE OPERATIVE PROCEDURE  PREPARATION:    The patient was brought to the operating room on the above mentioned date and appropriate monitoring was established by the anesthesia team. The patient was placed in the supine position on the operating table.  Intravenous antibiotics were administered. The patient was monitored closely throughout the procedure under conscious sedation.  Baseline transthoracic echocardiogram was performed. The patient's abdomen and both groins were prepped and draped in a sterile manner. A time out procedure was performed.  SENTINEL EMBOLIC PROTECTION DEVICE DEPLOYMENT  Using a modified Seldinger technique, the right wrist is anesthetized with 1% lidocaine and a 6/7 French slender sheath is inserted into the right radial artery. 3 mg of verapamil was administered through the sheath. Once the patient is fully heparinized, a 180 cm [facility] wire is advanced into the aortic root and the Sentinel catheter is advanced over the wire until it is in appropriate position in the innominate artery. The innominate filter is deployed using normal technique. The catheter tip is then deflected so that it approaches the left common  carotid artery. The left common carotid artery is wired with the [facility] wire. The left common carotid filter is deployed without difficulty.  PERIPHERAL ACCESS:    Using the modified Seldinger technique, femoral arterial and venous access was obtained with placement of 6 Fr sheaths on the left side.  A pigtail diagnostic catheter was passed through the left arterial sheath under fluoroscopic guidance into the aortic root.  A temporary transvenous pacemaker catheter was passed through the left femoral venous sheath under fluoroscopic guidance into the right ventricle.  The pacemaker was tested to ensure stable lead placement and pacemaker capture. Aortic root angiography was performed in order to determine the optimal angiographic angle for valve deployment.   TRANSFEMORAL ACCESS:   Percutaneous transfemoral access and sheath placement was performed using ultrasound guidance.  The right common femoral artery was cannulated using a micropuncture needle and appropriate location was verified using hand injection angiogram.  A pair of Abbott Perclose percutaneous closure devices were placed and a 6 French sheath replaced into the femoral artery.  The patient was heparinized systemically and ACT verified > 250 seconds.    A 16 Fr transfemoral E-sheath was introduced into the right common femoral artery after progressively dilating over an Amplatz superstiff wire. An AL-2 catheter was used to direct a straight-tip exchange length wire across the native aortic valve into the left ventricle. This was exchanged out for a pigtail catheter and position was confirmed in the LV apex. Simultaneous LV and Ao pressures were recorded.  The pigtail catheter was exchanged for an Amplatz Extra-stiff wire in the LV apex.     BALLOON AORTIC VALVULOPLASTY:   Not performed   TRANSCATHETER HEART VALVE DEPLOYMENT:   An Edwards Sapien 3 Ultra transcatheter heart valve (size 29 mm) was prepared and crimped per  manufacturer's guidelines, and the  proper orientation of the valve is confirmed on the [employer] delivery system. The valve was advanced through the introducer sheath using normal technique until in an appropriate position in the abdominal aorta beyond the sheath tip. The balloon was then retracted and using the fine-tuning wheel was centered on the valve. The valve was then advanced across the aortic arch using appropriate flexion of the catheter. The valve was carefully positioned across the aortic valve annulus. The Commander catheter was retracted using normal technique. Once final position of the valve has been confirmed by angiographic assessment, the valve is deployed while temporarily holding ventilation and during rapid ventricular pacing to maintain systolic blood pressure < 50 mmHg and pulse pressure < 10 mmHg. The balloon inflation is held for >3 seconds after reaching full deployment volume. Once the balloon has fully deflated the balloon is retracted into the ascending aorta and valve function is assessed using echocardiography. There is felt to be mild paravalvular leak and no central aortic insufficiency.  We felt that the leak was most likely due to a nodule of calcification in the annulus in two locations on the gated cardiac CTA. The valve appeared fully expanded and was 15% oversized so we did not feel that post-dilation was indicated. The patient's hemodynamic recovery following valve deployment is good.  The deployment balloon and guidewire are both removed.    PROCEDURE COMPLETION:   The sheath was removed and femoral artery closure performed.  Protamine was administered once femoral arterial repair was complete. The temporary pacemaker, pigtail catheters and femoral sheaths were removed with manual pressure used for hemostasis.  A Mynx femoral closure device was utilized following removal of the diagnostic sheath in the left femoral artery.  The patient tolerated the procedure  well and is transported to the cath lab recovery area in stable condition. There were no immediate intraoperative complications. All sponge instrument and needle counts are verified correct at completion of the operation.   No blood products were administered during the operation.  The patient received a total of 80 mL of intravenous contrast during the procedure.   [name], MD 10/25/2019

## 2019-10-26 NOTE — Progress Notes (Addendum)
Into assess patient, notice heart rhythm change. EKG done. Afib RVR. Per CCMD rhythm changed around 1421. Patient is asymptomatic. BP 153/69, HR 99, O2 96 % RA. Dr. Burt Knack paged via [facility] system. Left groin site clean dry and intact. Will continue to monitor.   1536: Per Dr. Burt Knack, patient chronic A Fib. Blessing for patient to ambulate and if left groin site remains clean dry and intact may discharge to home.

## 2019-10-26 NOTE — Progress Notes (Signed)
CARDIAC REHAB PHASE I   PRE:  Rate/Rhythm: 70 SR  BP:  Supine:   Sitting: 104/60  Standing:    SaO2: 97%RA  MODE:  Ambulation: 470 ft   POST:  Rate/Rhythm: 87 SR few PVCs  BP:  Supine: 132/54  Sitting:   Standing:    SaO2: 97%RA 0830-0910 Pt walked 470 ft on RA with rolling walker and asst x 1 due to equipment. Pt tolerated well. Checked groin after walk and no bleeding. Pt had few PVCs with activity. To bed for ECHO. Gave walking instructions for ex, heart healthy diet for reference and discussed CRP 2. Referred to [place] program. Pt will consider.    Graylon Good, RN BSN  10/26/2019 9:04 AM

## 2019-10-26 NOTE — Progress Notes (Addendum)
In to assess patient. Left groin site bleeding. Pressure applied. Nell Range, PA notified. Per Joellen Jersey, give Losartan now, hold pressure for 30 minutes. Dr. Burt Knack will be up to see patient later.   1315: Dr. Burt Knack at bedside. New gauze and tegaderm in place.Patient to be on bed rest for an hour. Patient verbalizes understanding. Will continue to monitor.

## 2019-10-26 NOTE — Progress Notes (Signed)
Discharge instructions reviewed patient and patient's wife. All questions answered. Left groin clean dry and intact after walk. Patient left unit via wheelchair at this time.

## 2019-10-26 NOTE — Progress Notes (Signed)
Mobility Specialist: Progress Note   10/26/19 1620  Mobility  Activity Ambulated in hall  Level of Assistance Independent after set-up  Assistive Device Front wheel walker  Distance Ambulated (ft) 220 ft  Mobility Response Tolerated well  Mobility performed by Mobility specialist  Bed Position Semi-fowlers  $Mobility charge 1 Mobility   Pre-Mobility: 86 HR, 156/75 BP, 99% SpO2 Post-Mobility: 90 HR, 173/68 BP, 96% SpO2  Pt tolerated mobility well. I checked incision sites before and after ambulation. Pt showed no signs of bleeding, RN notified. Pt had no c/o during ambulation.   [facility] [place] Mobility Specialist

## 2019-10-26 NOTE — Plan of Care (Signed)
Continue to monitor

## 2019-10-27 ENCOUNTER — Telehealth: Payer: Self-pay | Admitting: Physician Assistant

## 2019-10-27 LAB — ECHOCARDIOGRAM LIMITED
AR max vel: 2.6 cm2
AV Area VTI: 2.96 cm2
AV Area mean vel: 2.63 cm2
AV Mean grad: 10 mmHg
AV Peak grad: 20.3 mmHg
Ao pk vel: 2.25 m/s
Area-P 1/2: 1.94 cm2
Height: 71 in
Weight: 2701.96 oz

## 2019-10-27 LAB — TYPE AND SCREEN
ABO/RH(D): O POS
Antibody Screen: POSITIVE
Unit division: 0
Unit division: 0
Unit division: 0
Unit division: 0

## 2019-10-27 LAB — BPAM RBC
Blood Product Expiration Date: 202110142359
Blood Product Expiration Date: 202110192359
Blood Product Expiration Date: 202110202359
Blood Product Expiration Date: 202111032359
Unit Type and Rh: 5100
Unit Type and Rh: 5100
Unit Type and Rh: 5100
Unit Type and Rh: 9500

## 2019-10-27 NOTE — Telephone Encounter (Signed)
  [place] VALVE TEAM   Patient contacted regarding discharge from [facility] on 9/29  Patient understands to follow up with provider Nell Range on 10/7 at [facility].  Patient understands discharge instructions? yes Patient understands medications and regimen? yes Patient understands to bring all medications to this visit? Yes  Pt's groin bleeding has subsided. He will restart [employer] [employer].  Angelena Form PA-C  [facility]

## 2019-10-28 ENCOUNTER — Telehealth (HOSPITAL_COMMUNITY): Payer: Self-pay

## 2019-10-28 NOTE — Telephone Encounter (Signed)
Attempted to call patient in regards to Cardiac Rehab - unable to leave vm, vm full.

## 2019-10-28 NOTE — Telephone Encounter (Signed)
Pt insurance is active and benefits verified through [facility]. Co-pay $0.00, DED $192.00/$192.00 met, out of pocket $1,200.00/$1,200.00 met, co-insurance 0%. No pre-authorization required. Passport, 10/28/19 @ 3:02PM, [id number]#[id number]  Will contact patient to see if he is interested in the Cardiac Rehab Program. If interested, patient will need to complete follow up appt. Once completed, patient will be contacted for scheduling upon review by the RN Navigator.

## 2019-10-30 ENCOUNTER — Other Ambulatory Visit: Payer: Self-pay | Admitting: Oncology

## 2019-10-31 DIAGNOSIS — Z961 Presence of intraocular lens: Secondary | ICD-10-CM | POA: Diagnosis not present

## 2019-10-31 DIAGNOSIS — H26491 Other secondary cataract, right eye: Secondary | ICD-10-CM | POA: Diagnosis not present

## 2019-10-31 DIAGNOSIS — H524 Presbyopia: Secondary | ICD-10-CM | POA: Diagnosis not present

## 2019-10-31 LAB — POCT I-STAT 7, (LYTES, BLD GAS, ICA,H+H)
Acid-Base Excess: 1 mmol/L (ref 0.0–2.0)
Bicarbonate: 27.5 mmol/L (ref 20.0–28.0)
Calcium, Ion: 1.25 mmol/L (ref 1.15–1.40)
HCT: 36 % — ABNORMAL LOW (ref 39.0–52.0)
Hemoglobin: 12.2 g/dL — ABNORMAL LOW (ref 13.0–17.0)
O2 Saturation: 100 %
Potassium: 3.6 mmol/L (ref 3.5–5.1)
Sodium: 144 mmol/L (ref 135–145)
TCO2: 29 mmol/L (ref 22–32)
pCO2 arterial: 53.2 mmHg — ABNORMAL HIGH (ref 32.0–48.0)
pH, Arterial: 7.321 — ABNORMAL LOW (ref 7.350–7.450)
pO2, Arterial: 202 mmHg — ABNORMAL HIGH (ref 83.0–108.0)

## 2019-10-31 LAB — POCT ACTIVATED CLOTTING TIME
Activated Clotting Time: 351 seconds
Activated Clotting Time: 127 seconds

## 2019-10-31 LAB — POCT I-STAT, CHEM 8
BUN: 8 mg/dL (ref 8–23)
Calcium, Ion: 1.26 mmol/L (ref 1.15–1.40)
Chloride: 104 mmol/L (ref 98–111)
Creatinine, Ser: 0.6 mg/dL — ABNORMAL LOW (ref 0.61–1.24)
Glucose, Bld: 107 mg/dL — ABNORMAL HIGH (ref 70–99)
HCT: 34 % — ABNORMAL LOW (ref 39.0–52.0)
Hemoglobin: 11.6 g/dL — ABNORMAL LOW (ref 13.0–17.0)
Potassium: 3.6 mmol/L (ref 3.5–5.1)
Sodium: 143 mmol/L (ref 135–145)
TCO2: 27 mmol/L (ref 22–32)

## 2019-11-01 MED FILL — XARELTO 20 MG TABLET: 20 | 30 days supply | Qty: 30 | Fill #7

## 2019-11-02 ENCOUNTER — Other Ambulatory Visit: Payer: Medicare PPO

## 2019-11-02 ENCOUNTER — Inpatient Hospital Stay: Payer: Medicare PPO

## 2019-11-02 ENCOUNTER — Inpatient Hospital Stay: Payer: Medicare PPO | Attending: Oncology | Admitting: Oncology

## 2019-11-02 ENCOUNTER — Other Ambulatory Visit: Payer: Self-pay

## 2019-11-02 ENCOUNTER — Telehealth: Payer: Self-pay | Admitting: Oncology

## 2019-11-02 ENCOUNTER — Ambulatory Visit: Payer: Medicare PPO | Admitting: Nurse Practitioner

## 2019-11-02 VITALS — BP 122/63 | HR 72 | Temp 98.9°F | Resp 17 | Ht 71.0 in | Wt 174.7 lb

## 2019-11-02 DIAGNOSIS — Z79899 Other long term (current) drug therapy: Secondary | ICD-10-CM | POA: Diagnosis not present

## 2019-11-02 DIAGNOSIS — Z5111 Encounter for antineoplastic chemotherapy: Secondary | ICD-10-CM | POA: Diagnosis not present

## 2019-11-02 DIAGNOSIS — C9000 Multiple myeloma not having achieved remission: Secondary | ICD-10-CM

## 2019-11-02 DIAGNOSIS — D696 Thrombocytopenia, unspecified: Secondary | ICD-10-CM | POA: Diagnosis not present

## 2019-11-02 LAB — CMP (CANCER CENTER ONLY)
ALT: 10 U/L (ref 0–44)
AST: 14 U/L — ABNORMAL LOW (ref 15–41)
Albumin: 3.1 g/dL — ABNORMAL LOW (ref 3.5–5.0)
Alkaline Phosphatase: 39 U/L (ref 38–126)
Anion gap: 2 — ABNORMAL LOW (ref 5–15)
BUN: 9 mg/dL (ref 8–23)
CO2: 29 mmol/L (ref 22–32)
Calcium: 9 mg/dL (ref 8.9–10.3)
Chloride: 109 mmol/L (ref 98–111)
Creatinine: 0.71 mg/dL (ref 0.61–1.24)
GFR, Estimated: >60 mL/min (ref >60)
Glucose, Bld: 103 mg/dL — ABNORMAL HIGH (ref 70–99)
Potassium: 3.8 mmol/L (ref 3.5–5.1)
Sodium: 140 mmol/L (ref 135–145)
Total Bilirubin: 0.7 mg/dL (ref 0.3–1.2)
Total Protein: 5.5 g/dL — ABNORMAL LOW (ref 6.5–8.1)

## 2019-11-02 LAB — CBC WITH DIFFERENTIAL (CANCER CENTER ONLY)
Abs Immature Granulocytes: 0.01 10*3/uL (ref 0.00–0.07)
Basophils Absolute: 0 10*3/uL (ref 0.0–0.1)
Basophils Relative: 1 %
Eosinophils Absolute: 0.1 10*3/uL (ref 0.0–0.5)
Eosinophils Relative: 2 %
HCT: 36.6 % — ABNORMAL LOW (ref 39.0–52.0)
Hemoglobin: 12.4 g/dL — ABNORMAL LOW (ref 13.0–17.0)
Immature Granulocytes: 0 %
Lymphocytes Relative: 16 %
Lymphs Abs: 0.5 10*3/uL — ABNORMAL LOW (ref 0.7–4.0)
MCH: 32 pg (ref 26.0–34.0)
MCHC: 33.9 g/dL (ref 30.0–36.0)
MCV: 94.3 fL (ref 80.0–100.0)
Monocytes Absolute: 0.4 10*3/uL (ref 0.1–1.0)
Monocytes Relative: 12 %
Neutro Abs: 2.4 10*3/uL (ref 1.7–7.7)
Neutrophils Relative %: 69 %
Platelet Count: 44 10*3/uL — ABNORMAL LOW (ref 150–400)
RBC: 3.88 MIL/uL — ABNORMAL LOW (ref 4.22–5.81)
RDW: 12.5 % (ref 11.5–15.5)
WBC Count: 3.4 10*3/uL — ABNORMAL LOW (ref 4.0–10.5)
nRBC: 0 % (ref 0.0–0.2)

## 2019-11-02 NOTE — Telephone Encounter (Signed)
Scheduled appointments per 10/6 los. Gave patient updated calendar.  
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
## 2019-11-03 ENCOUNTER — Ambulatory Visit (INDEPENDENT_AMBULATORY_CARE_PROVIDER_SITE_OTHER): Payer: Medicare PPO | Admitting: Physician Assistant

## 2019-11-03 ENCOUNTER — Encounter: Payer: Self-pay | Admitting: Physician Assistant

## 2019-11-03 VITALS — BP 122/60 | HR 67 | Ht 71.0 in | Wt 175.0 lb

## 2019-11-03 DIAGNOSIS — I2510 Atherosclerotic heart disease of native coronary artery without angina pectoris: Secondary | ICD-10-CM

## 2019-11-03 DIAGNOSIS — Z952 Presence of prosthetic heart valve: Secondary | ICD-10-CM | POA: Diagnosis not present

## 2019-11-03 DIAGNOSIS — D696 Thrombocytopenia, unspecified: Secondary | ICD-10-CM | POA: Diagnosis not present

## 2019-11-03 DIAGNOSIS — I10 Essential (primary) hypertension: Secondary | ICD-10-CM

## 2019-11-03 DIAGNOSIS — I480 Paroxysmal atrial fibrillation: Secondary | ICD-10-CM

## 2019-11-03 LAB — KAPPA/LAMBDA LIGHT CHAINS
Kappa free light chain: 1.8 mg/L — ABNORMAL LOW (ref 3.3–19.4)
Kappa, lambda light chain ratio: 0.18 — ABNORMAL LOW (ref 0.26–1.65)
Lambda free light chains: 10 mg/L (ref 5.7–26.3)

## 2019-11-03 LAB — IGA: IgA: 10 mg/dL — ABNORMAL LOW (ref 61–437)

## 2019-11-03 MED ORDER — AMOXICILLIN 500 MG PO TABS: ORAL_TABLET | ORAL | 12 refills | Status: DC

## 2019-11-03 NOTE — Patient Instructions (Signed)
Medication Instructions:  Your provider discussed the importance of taking an antibiotic prior to all dental visits to prevent damage to the heart valves from infection. You were given a prescription for AMOXIL 2,000 mg to take one hour prior to any dental appointment.  *If you need a refill on your cardiac medications before your next appointment, please call your pharmacy*  Follow-Up: Please keep your appointments as scheduled!
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
## 2019-11-14 ENCOUNTER — Telehealth (HOSPITAL_COMMUNITY): Payer: Self-pay | Admitting: *Deleted

## 2019-11-14 NOTE — Telephone Encounter (Signed)
-----  Message from Ladell Pier, MD sent at 11/11/2019  7:08 PM EDT ----- Regarding: RE: Any contraindications to group exercise at [facility] Should be ok for him to participate ----- Message ----- From: Rowe Pavy, RN Sent: 11/11/2019  11:54 AM EDT To: Ladell Pier, MD Subject: Any contraindications to group exercise at C#   Dr [name],  The above pt is eligible to participate in cardiac rehab s/p TAVR on 9/28.  Pt completed follow up with you on 10/6 for multiple myeloma and thrombocytopenia.   Do you foresee any contraindications for this pt regarding participating in group exercise?  Precautions are observed for all pt such as mask wearing during exercise, pt confined to 2 piece equipment "pod" separated by plexiglas shields and covid questionnaire screening prior to entering the gym.  Thanks you for your advisement  Maurice Small RN, BSN Cardiac and Pulmonary Rehab Nurse Navigator

## 2019-11-15 ENCOUNTER — Encounter: Payer: Self-pay | Admitting: Nurse Practitioner

## 2019-11-15 ENCOUNTER — Inpatient Hospital Stay (HOSPITAL_BASED_OUTPATIENT_CLINIC_OR_DEPARTMENT_OTHER): Payer: Medicare PPO | Admitting: Nurse Practitioner

## 2019-11-15 ENCOUNTER — Inpatient Hospital Stay: Payer: Medicare PPO

## 2019-11-15 ENCOUNTER — Other Ambulatory Visit: Payer: Self-pay

## 2019-11-15 VITALS — BP 132/66 | HR 63 | Temp 97.5°F | Resp 17 | Ht 71.0 in | Wt 175.1 lb

## 2019-11-15 DIAGNOSIS — I4891 Unspecified atrial fibrillation: Secondary | ICD-10-CM | POA: Diagnosis not present

## 2019-11-15 DIAGNOSIS — D696 Thrombocytopenia, unspecified: Secondary | ICD-10-CM | POA: Diagnosis not present

## 2019-11-15 DIAGNOSIS — D649 Anemia, unspecified: Secondary | ICD-10-CM | POA: Diagnosis not present

## 2019-11-15 DIAGNOSIS — C9000 Multiple myeloma not having achieved remission: Secondary | ICD-10-CM | POA: Diagnosis not present

## 2019-11-15 DIAGNOSIS — D61818 Other pancytopenia: Secondary | ICD-10-CM | POA: Diagnosis not present

## 2019-11-15 DIAGNOSIS — Z5111 Encounter for antineoplastic chemotherapy: Secondary | ICD-10-CM | POA: Diagnosis not present

## 2019-11-15 DIAGNOSIS — C61 Malignant neoplasm of prostate: Secondary | ICD-10-CM | POA: Diagnosis not present

## 2019-11-15 DIAGNOSIS — Z79899 Other long term (current) drug therapy: Secondary | ICD-10-CM | POA: Diagnosis not present

## 2019-11-15 DIAGNOSIS — N400 Enlarged prostate without lower urinary tract symptoms: Secondary | ICD-10-CM | POA: Diagnosis not present

## 2019-11-15 DIAGNOSIS — I10 Essential (primary) hypertension: Secondary | ICD-10-CM | POA: Diagnosis not present

## 2019-11-15 LAB — CBC WITH DIFFERENTIAL (CANCER CENTER ONLY)
Abs Immature Granulocytes: 0.01 10*3/uL (ref 0.00–0.07)
Basophils Absolute: 0 10*3/uL (ref 0.0–0.1)
Basophils Relative: 1 %
Eosinophils Absolute: 0 10*3/uL (ref 0.0–0.5)
Eosinophils Relative: 1 %
HCT: 40.1 % (ref 39.0–52.0)
Hemoglobin: 13.3 g/dL (ref 13.0–17.0)
Immature Granulocytes: 0 %
Lymphocytes Relative: 23 %
Lymphs Abs: 0.8 10*3/uL (ref 0.7–4.0)
MCH: 31.5 pg (ref 26.0–34.0)
MCHC: 33.2 g/dL (ref 30.0–36.0)
MCV: 95 fL (ref 80.0–100.0)
Monocytes Absolute: 0.5 10*3/uL (ref 0.1–1.0)
Monocytes Relative: 14 %
Neutro Abs: 2.2 10*3/uL (ref 1.7–7.7)
Neutrophils Relative %: 61 %
Platelet Count: 57 10*3/uL — ABNORMAL LOW (ref 150–400)
RBC: 4.22 MIL/uL (ref 4.22–5.81)
RDW: 12.2 % (ref 11.5–15.5)
WBC Count: 3.5 10*3/uL — ABNORMAL LOW (ref 4.0–10.5)
nRBC: 0 % (ref 0.0–0.2)

## 2019-11-15 LAB — CMP (CANCER CENTER ONLY)
ALT: 11 U/L (ref 0–44)
AST: 16 U/L (ref 15–41)
Albumin: 3.4 g/dL — ABNORMAL LOW (ref 3.5–5.0)
Alkaline Phosphatase: 41 U/L (ref 38–126)
Anion gap: 4 — ABNORMAL LOW (ref 5–15)
BUN: 8 mg/dL (ref 8–23)
CO2: 29 mmol/L (ref 22–32)
Calcium: 9.1 mg/dL (ref 8.9–10.3)
Chloride: 108 mmol/L (ref 98–111)
Creatinine: 0.74 mg/dL (ref 0.61–1.24)
GFR, Estimated: >60 mL/min (ref >60)
Glucose, Bld: 80 mg/dL (ref 70–99)
Potassium: 4 mmol/L (ref 3.5–5.1)
Sodium: 141 mmol/L (ref 135–145)
Total Bilirubin: 0.7 mg/dL (ref 0.3–1.2)
Total Protein: 5.6 g/dL — ABNORMAL LOW (ref 6.5–8.1)

## 2019-11-15 MED ORDER — SODIUM CHLORIDE 0.9 % IV SOLN
INTRAVENOUS | Status: DC
  Filled 2019-11-15: qty 250

## 2019-11-15 MED ORDER — PROCHLORPERAZINE MALEATE 10 MG PO TABS
10.0000 mg | ORAL_TABLET | Freq: Four times a day (QID) | ORAL | Status: DC | PRN
  Administered 2019-11-15: 10 mg via ORAL

## 2019-11-15 MED ORDER — DEXAMETHASONE 4 MG PO TABS
20.0000 mg | ORAL_TABLET | Freq: Once | ORAL | Status: AC
  Administered 2019-11-15: 20 mg via ORAL

## 2019-11-15 MED ORDER — DEXTROSE 5 % IV SOLN
20.0000 mg/m2 | Freq: Once | INTRAVENOUS | Status: AC
  Administered 2019-11-15: 40 mg via INTRAVENOUS
  Filled 2019-11-15: qty 15

## 2019-11-15 MED ORDER — DEXAMETHASONE 4 MG PO TABS
ORAL_TABLET | ORAL | Status: AC
  Filled 2019-11-15: qty 5

## 2019-11-15 MED ORDER — PROCHLORPERAZINE MALEATE 10 MG PO TABS
ORAL_TABLET | ORAL | Status: AC
  Filled 2019-11-15: qty 1

## 2019-11-15 NOTE — Patient Instructions (Signed)
[facility] Discharge Instructions for Patients Receiving Chemotherapy  Today you received the following chemotherapy agents:  Kyprolis  To help prevent nausea and vomiting after your treatment, we encourage you to take your nausea medication as directed.   If you develop nausea and vomiting that is not controlled by your nausea medication, call the clinic.   BELOW ARE SYMPTOMS THAT SHOULD BE REPORTED IMMEDIATELY:  *FEVER GREATER THAN 100.5 F  *CHILLS WITH OR WITHOUT FEVER  NAUSEA AND VOMITING THAT IS NOT CONTROLLED WITH YOUR NAUSEA MEDICATION  *UNUSUAL SHORTNESS OF BREATH  *UNUSUAL BRUISING OR BLEEDING  TENDERNESS IN MOUTH AND THROAT WITH OR WITHOUT PRESENCE OF ULCERS  *URINARY PROBLEMS  *BOWEL PROBLEMS  UNUSUAL RASH Items with * indicate a potential emergency and should be followed up as soon as possible.  Feel free to call the clinic should you have any questions or concerns. The clinic phone number is [phone].  Please show the CHEMO ALERT CARD at check-in to the Emergency Department and triage nurse.   

## 2019-11-15 NOTE — Progress Notes (Addendum)
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  Carfilzomib was held last week due to thrombocytopenia.  He denies bleeding except associated with a small scab at the left lower leg.  He is no longer having the chest tightness with exercise.  He has been able to increase the distance he is walking.  Objective:  Vital signs in last 24 hours:  Blood pressure 132/66, pulse 63, temperature (!) 97.5 F (36.4 C), temperature source Tympanic, resp. rate 17, height _0  (1.803 m), weight 175 lb 1.6 oz (79.4 kg), SpO2 100 %.    HEENT: No thrush or ulcers. Resp: Lungs clear bilaterally. Cardio: Regular rate and rhythm. GI: Abdomen soft and nontender.  No hepatosplenomegaly. Vascular: Trace to 1+ pitting edema at the lower legs bilaterally.  Skin: Resolving ecchymoses at the bilateral groin regions.  Small scab left lower outer leg.   Lab Results:  Lab Results  Component Value Date   WBC 3.5 (L) 11/15/2019   HGB 13.3 11/15/2019   HCT 40.1 11/15/2019   MCV 95.0 11/15/2019   PLT 57 (L) 11/15/2019   NEUTROABS 2.2 11/15/2019    Imaging:  No results found.  Medications: I have reviewed the patient's current medications.  Assessment/Plan: 1. Multiple myeloma-confirmed on a bone marrow biopsy 08/07/2014, IgA lambda  Serum M spike and increased serum free lambda light chains  Myeloma FISH panel negative for chromosome 4, 11, 12, 13, 14, and 17 abnormalities, cytogenetics with no metaphases  Bone survey 08/15/2014 with indeterminant lucent skull lesions  Cycle 1 RVD 08/22/2014  Cycle 2 RVD 09/19/2014  Cycle 3 RVD 10/17/2014  Cycle 4 RVD 11/14/2014 (Decadron reduced to 20 mg weekly, Revlimid 15 mg days 1 through 14, Velcade 3/4 weeks)  Cycle 5 RVD 12/12/2014  Serum M spike not detected 12/26/2014  Cycle 6 RVD 01/09/2015  Maintenance Revlimid, 10 mg daily, 02/05/2015 , discontinue  02/26/2015 secondary to leg edema and diarrhea  Revlimid resumed at a dose of 10 mg every other day beginning 03/13/2015  PET scan 43/15/4008-QP hypermetabolic bone lesions, few lucent lesions in the spine and pelvis  Revlimid discontinued January 2019  Enrollment on a clinical trial at [facility] with pomalidomide/Decadron+/- ixazomibbeginning 03/10/2017  Treatment discontinued February 2021 secondary to rising serum lambda light chains  PET 04/05/2019-hypermetabolic right axillary/chest wall nodes, right paravertebral mass, small right pleural effusion, retrocrural node, right lateral abdominal wall mass, L5 lesion mild compression deformity. Focus of hypermetabolism at the anterior left pelvic wall without a CT correlate. Hypermetabolic lytic lesion in the left T3 transverse process  Cycle 1 daratumumab/carfilzomib/Decadron 04/05/2019  Radiation to the right chest wall mass and lumbar spine beginning 04/25/2019  Cycle 2 daratumumab/carfilzomib/Decadron 05/04/2019 (carfilzomib dose escalated); day 8 held due to neutropenia, thrombocytopenia; day 15 05/18/2019 (carfilzomib dose reduced)  Daratumumab/carfilzomib/Decadron changed to every 2-week dosing beginning 05/25/2019  Daratumumab changed to a monthly schedule beginning 09/21/2019; carfilzomib every 2 weeks  Daratumumab held 11/15/2019 secondary to thrombocytopenia, carfilzomib continued at a reduced dose  2. 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 History of intermittent prostatitis  4. Skin rash 10/24/2014-referred to dermatology, biopsy consistent with local hypersensitivity reaction  5. 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 Left lower leg cellulitis  06/24/2015-treated with  Keflex  7. History of mild thrombocytopenia secondary tomyeloma and systemic therapy  8. History of mild neutropenia-secondary to myeloma and systemic therapy  9. Fall with a skull fracture and subarachnoid blood/right frontal lobe contusions 10/17/2015  10. Right lower lobe pulmonary embolus 03/28/2016. Lovenox , transitioned to Xarelto  11. Vesicular rashJune 2018-potentially related to the zoster vaccine, resolved  12. History of atrial fibrillation  13. Aortic stenosis-TAVR 10/25/2019  14. 07/02/2019-hospital admission for sepsis secondary to cellulitis of the left lower extremity with gram-negative rod bacteremia(Aeromonascaviae)    Disposition: Mitchell Lowery appears unchanged.  Carfilzomib was held 2 weeks ago due to thrombocytopenia.  The platelet count is higher today but not significantly improved.  The thrombocytopenia may be due to daratumumab.  Plan for today is to hold daratumumab, proceed with carfilzomib at a reduced dose.  Bleeding precautions reviewed.  He understands to contact the office with any bleeding.  He will return for lab, follow-up, treatment in 2 weeks.  We will repeat the myeloma panel when he is here in 2 weeks.  Patient seen with [name].    Ned Card ANP/GNP-BC   11/15/2019  11:22 AM This was a shared visit with Ned Card.  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  The dyspnea has improved following the TAVR procedure.  He has persistent thrombocytopenia.  The thrombocytopenia is most likely related to daratumumab or carfilzomib.  I doubt the thrombocytopenia is related to multiple myeloma as there is no clinical or other laboratory evidence of disease progression.  We discussed bleeding risk with him.  We decided to proceed with single agent carfilzomib at a dose reduction.  Julieanne Manson, MD  Julieanne Manson, MD

## 2019-11-16 ENCOUNTER — Telehealth: Payer: Self-pay | Admitting: Nurse Practitioner

## 2019-11-16 NOTE — Telephone Encounter (Signed)
Scheduled appointment per 10/19 los. Spoke to patient who is aware of appointment date and time.

## 2019-11-19 ENCOUNTER — Emergency Department (HOSPITAL_COMMUNITY)
Admission: EM | Admit: 2019-11-19 | Discharge: 2019-11-19 | Disposition: A | Discharge status: Home / Self Care | Payer: Medicare PPO | Attending: Emergency Medicine | Admitting: Emergency Medicine

## 2019-11-19 ENCOUNTER — Emergency Department (HOSPITAL_COMMUNITY): Payer: Medicare PPO

## 2019-11-19 ENCOUNTER — Emergency Department (HOSPITAL_BASED_OUTPATIENT_CLINIC_OR_DEPARTMENT_OTHER): Payer: Medicare PPO

## 2019-11-19 ENCOUNTER — Other Ambulatory Visit: Payer: Self-pay

## 2019-11-19 ENCOUNTER — Encounter (HOSPITAL_COMMUNITY): Payer: Self-pay | Admitting: Emergency Medicine

## 2019-11-19 DIAGNOSIS — Z8546 Personal history of malignant neoplasm of prostate: Secondary | ICD-10-CM | POA: Insufficient documentation

## 2019-11-19 DIAGNOSIS — Z86711 Personal history of pulmonary embolism: Secondary | ICD-10-CM | POA: Diagnosis not present

## 2019-11-19 DIAGNOSIS — R609 Edema, unspecified: Secondary | ICD-10-CM

## 2019-11-19 DIAGNOSIS — Z7901 Long term (current) use of anticoagulants: Secondary | ICD-10-CM | POA: Diagnosis not present

## 2019-11-19 DIAGNOSIS — J811 Chronic pulmonary edema: Secondary | ICD-10-CM | POA: Diagnosis not present

## 2019-11-19 DIAGNOSIS — C9000 Multiple myeloma not having achieved remission: Secondary | ICD-10-CM | POA: Diagnosis not present

## 2019-11-19 DIAGNOSIS — R079 Chest pain, unspecified: Secondary | ICD-10-CM | POA: Diagnosis not present

## 2019-11-19 DIAGNOSIS — R0781 Pleurodynia: Secondary | ICD-10-CM

## 2019-11-19 DIAGNOSIS — J9811 Atelectasis: Secondary | ICD-10-CM | POA: Diagnosis not present

## 2019-11-19 DIAGNOSIS — Z955 Presence of coronary angioplasty implant and graft: Secondary | ICD-10-CM | POA: Insufficient documentation

## 2019-11-19 DIAGNOSIS — I517 Cardiomegaly: Secondary | ICD-10-CM | POA: Diagnosis not present

## 2019-11-19 DIAGNOSIS — I480 Paroxysmal atrial fibrillation: Secondary | ICD-10-CM | POA: Diagnosis not present

## 2019-11-19 DIAGNOSIS — Z952 Presence of prosthetic heart valve: Secondary | ICD-10-CM | POA: Diagnosis not present

## 2019-11-19 DIAGNOSIS — Z5111 Encounter for antineoplastic chemotherapy: Secondary | ICD-10-CM | POA: Diagnosis not present

## 2019-11-19 DIAGNOSIS — I313 Pericardial effusion (noninflammatory): Secondary | ICD-10-CM | POA: Diagnosis not present

## 2019-11-19 DIAGNOSIS — I2510 Atherosclerotic heart disease of native coronary artery without angina pectoris: Secondary | ICD-10-CM | POA: Insufficient documentation

## 2019-11-19 LAB — CBC
HCT: 44.3 % (ref 39.0–52.0)
Hemoglobin: 14.4 g/dL (ref 13.0–17.0)
MCH: 31.5 pg (ref 26.0–34.0)
MCHC: 32.5 g/dL (ref 30.0–36.0)
MCV: 96.9 fL (ref 80.0–100.0)
Platelets: 46 10*3/uL — ABNORMAL LOW (ref 150–400)
RBC: 4.57 MIL/uL (ref 4.22–5.81)
RDW: 12.2 % (ref 11.5–15.5)
WBC: 6.3 10*3/uL (ref 4.0–10.5)
nRBC: 0 % (ref 0.0–0.2)

## 2019-11-19 LAB — BASIC METABOLIC PANEL
Anion gap: 8 (ref 5–15)
BUN: 10 mg/dL (ref 8–23)
CO2: 28 mmol/L (ref 22–32)
Calcium: 9.4 mg/dL (ref 8.9–10.3)
Chloride: 105 mmol/L (ref 98–111)
Creatinine, Ser: 0.77 mg/dL (ref 0.61–1.24)
GFR, Estimated: >60 mL/min (ref >60)
Glucose, Bld: 102 mg/dL — ABNORMAL HIGH (ref 70–99)
Potassium: 3.9 mmol/L (ref 3.5–5.1)
Sodium: 141 mmol/L (ref 135–145)

## 2019-11-19 LAB — TROPONIN I (HIGH SENSITIVITY)
Troponin I (High Sensitivity): 7 ng/L (ref <18)
Troponin I (High Sensitivity): 6 ng/L (ref <18)

## 2019-11-19 LAB — ECHOCARDIOGRAM COMPLETE
AR max vel: 2.61 cm2
AV Area VTI: 2.99 cm2
AV Area mean vel: 2.54 cm2
AV Mean grad: 10.5 mmHg
AV Peak grad: 20.1 mmHg
Ao pk vel: 2.24 m/s
S' Lateral: 2.4 cm

## 2019-11-19 LAB — SEDIMENTATION RATE: Sed Rate: 6 mm/hr (ref 0–16)

## 2019-11-19 LAB — D-DIMER, QUANTITATIVE: D-Dimer, Quant: 1.39 ug/mL-FEU — ABNORMAL HIGH (ref 0.00–0.50)

## 2019-11-19 MED ORDER — ACETAMINOPHEN 325 MG PO TABS
650.0000 mg | ORAL_TABLET | Freq: Once | ORAL | Status: AC
  Administered 2019-11-19: 650 mg via ORAL
  Filled 2019-11-19: qty 2

## 2019-11-19 MED ORDER — AMIODARONE IV BOLUS ONLY 150 MG/100ML
150.0000 mg | Freq: Once | INTRAVENOUS | Status: AC
  Administered 2019-11-19: 150 mg via INTRAVENOUS
  Filled 2019-11-19: qty 100

## 2019-11-19 MED ORDER — IOHEXOL 350 MG/ML SOLN
80.0000 mL | Freq: Once | INTRAVENOUS | Status: AC | PRN
  Administered 2019-11-19: 80 mL via INTRAVENOUS

## 2019-11-19 NOTE — ED Triage Notes (Signed)
Pt reports sharp chest pain that started while watching granddaughter play indoor tennis. Mild dizziness and pain increases with deep inspiration.  Denies SOB and nausea.

## 2019-11-19 NOTE — Progress Notes (Signed)
Lower extremity venous has been completed.   Preliminary results in CV Proc.   Abram Sander 11/19/2019 2:50 PM

## 2019-11-19 NOTE — Discharge Instructions (Signed)
You had a CT scan of your chest performed today that showed enlarging of a mass in front of your T10 vertebrae. Please follow-up with [name] with oncology for further evaluation. Please continue your current medications as prescribed. Call cardiology for follow-up and recheck for your chest pain.
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
## 2019-11-19 NOTE — ED Notes (Signed)
Patient transported to X-ray 

## 2019-11-19 NOTE — ED Notes (Signed)
Patient transported to CT 

## 2019-11-19 NOTE — Progress Notes (Signed)
  Echocardiogram 2D Echocardiogram has been performed.  Mitchell Lowery 11/19/2019, 2:35 PM

## 2019-11-23 ENCOUNTER — Telehealth: Payer: Self-pay | Admitting: Physician Assistant

## 2019-11-23 DIAGNOSIS — I480 Paroxysmal atrial fibrillation: Secondary | ICD-10-CM

## 2019-11-23 NOTE — Telephone Encounter (Addendum)
Per Dr. Burt Knack, instructed the patient to START TOPROL 25 mg daily. Will send 2 week Zio to the patient's home. The patient states he has monitored his HR since being home and it has been between high 50s and 70.  He is nervous to start a betablocker with his HR readings. He requests Dr. Burt Knack is made aware before starting. Will cancel 11/3 echo as the echo completed in the hospital is in of the 23-75 day window for TAVR. Will call patient with Dr. Antionette Char recommendations.

## 2019-11-23 NOTE — Telephone Encounter (Signed)
Patient would like to discuss the tests he recently had done at the hospital. He states he had an echo done and is scheduled for another one 11/30/19. He would like to know if he still needs to keep this appointment.

## 2019-11-23 NOTE — Telephone Encounter (Signed)
Let's try Toprol XL 12.5 mg daily. I think it's a good balance between trying to reduce AF and being cautious about bradycardia.

## 2019-11-24 ENCOUNTER — Telehealth: Payer: Self-pay | Admitting: Radiology

## 2019-11-24 ENCOUNTER — Other Ambulatory Visit: Payer: Self-pay | Admitting: Oncology

## 2019-11-24 NOTE — Telephone Encounter (Signed)
Enrolled patient for a 14 day Zio XT Monitor to be mailed to patients home  

## 2019-11-24 NOTE — Telephone Encounter (Signed)
Attempted to call patient. Mailbox is full - unable to leave message.  Will try again later.

## 2019-11-25 MED ORDER — METOPROLOL SUCCINATE ER 25 MG PO TB24: 12.5000 mg | ORAL_TABLET | Freq: Every day | ORAL | 3 refills | Status: AC

## 2019-11-25 NOTE — Telephone Encounter (Signed)
Instructed the patient to start Toprol 12.5 mg daily. Confirmed appointment with KT 11/3. He was grateful for assistance.

## 2019-11-28 ENCOUNTER — Other Ambulatory Visit (INDEPENDENT_AMBULATORY_CARE_PROVIDER_SITE_OTHER): Payer: Medicare PPO

## 2019-11-28 DIAGNOSIS — I480 Paroxysmal atrial fibrillation: Secondary | ICD-10-CM

## 2019-11-29 ENCOUNTER — Inpatient Hospital Stay: Payer: Medicare PPO

## 2019-11-29 ENCOUNTER — Other Ambulatory Visit: Payer: Self-pay

## 2019-11-29 ENCOUNTER — Inpatient Hospital Stay (HOSPITAL_BASED_OUTPATIENT_CLINIC_OR_DEPARTMENT_OTHER): Payer: Medicare PPO | Admitting: Oncology

## 2019-11-29 ENCOUNTER — Inpatient Hospital Stay: Payer: Medicare PPO | Attending: Oncology

## 2019-11-29 VITALS — BP 141/70 | HR 65 | Temp 97.7°F | Resp 17 | Ht 71.0 in | Wt 175.3 lb

## 2019-11-29 DIAGNOSIS — Z79899 Other long term (current) drug therapy: Secondary | ICD-10-CM | POA: Insufficient documentation

## 2019-11-29 DIAGNOSIS — Z923 Personal history of irradiation: Secondary | ICD-10-CM | POA: Insufficient documentation

## 2019-11-29 DIAGNOSIS — C9000 Multiple myeloma not having achieved remission: Secondary | ICD-10-CM

## 2019-11-29 DIAGNOSIS — Z5111 Encounter for antineoplastic chemotherapy: Secondary | ICD-10-CM | POA: Insufficient documentation

## 2019-11-29 LAB — CMP (CANCER CENTER ONLY)
ALT: 13 U/L (ref 0–44)
AST: 18 U/L (ref 15–41)
Albumin: 3.2 g/dL — ABNORMAL LOW (ref 3.5–5.0)
Alkaline Phosphatase: 38 U/L (ref 38–126)
Anion gap: 7 (ref 5–15)
BUN: 9 mg/dL (ref 8–23)
CO2: 26 mmol/L (ref 22–32)
Calcium: 8.9 mg/dL (ref 8.9–10.3)
Chloride: 109 mmol/L (ref 98–111)
Creatinine: 0.73 mg/dL (ref 0.61–1.24)
GFR, Estimated: >60 mL/min (ref >60)
Glucose, Bld: 94 mg/dL (ref 70–99)
Potassium: 3.9 mmol/L (ref 3.5–5.1)
Sodium: 142 mmol/L (ref 135–145)
Total Bilirubin: 0.6 mg/dL (ref 0.3–1.2)
Total Protein: 5.6 g/dL — ABNORMAL LOW (ref 6.5–8.1)

## 2019-11-29 LAB — CBC WITH DIFFERENTIAL (CANCER CENTER ONLY)
Abs Immature Granulocytes: 0.01 10*3/uL (ref 0.00–0.07)
Basophils Absolute: 0 10*3/uL (ref 0.0–0.1)
Basophils Relative: 1 %
Eosinophils Absolute: 0.1 10*3/uL (ref 0.0–0.5)
Eosinophils Relative: 2 %
HCT: 38.8 % — ABNORMAL LOW (ref 39.0–52.0)
Hemoglobin: 12.7 g/dL — ABNORMAL LOW (ref 13.0–17.0)
Immature Granulocytes: 0 %
Lymphocytes Relative: 17 %
Lymphs Abs: 0.7 10*3/uL (ref 0.7–4.0)
MCH: 30.7 pg (ref 26.0–34.0)
MCHC: 32.7 g/dL (ref 30.0–36.0)
MCV: 93.7 fL (ref 80.0–100.0)
Monocytes Absolute: 0.4 10*3/uL (ref 0.1–1.0)
Monocytes Relative: 11 %
Neutro Abs: 2.6 10*3/uL (ref 1.7–7.7)
Neutrophils Relative %: 69 %
Platelet Count: 76 10*3/uL — ABNORMAL LOW (ref 150–400)
RBC: 4.14 MIL/uL — ABNORMAL LOW (ref 4.22–5.81)
RDW: 12.2 % (ref 11.5–15.5)
WBC Count: 3.9 10*3/uL — ABNORMAL LOW (ref 4.0–10.5)
nRBC: 0 % (ref 0.0–0.2)

## 2019-11-29 MED ORDER — DEXTROSE 5 % IV SOLN
20.0000 mg/m2 | Freq: Once | INTRAVENOUS | Status: AC
  Administered 2019-11-29: 40 mg via INTRAVENOUS
  Filled 2019-11-29: qty 5

## 2019-11-29 MED ORDER — SODIUM CHLORIDE 0.9 % IV SOLN
INTRAVENOUS | Status: DC
  Filled 2019-11-29: qty 250

## 2019-11-29 MED ORDER — DEXAMETHASONE 4 MG PO TABS
20.0000 mg | ORAL_TABLET | Freq: Once | ORAL | Status: AC
  Administered 2019-11-29: 20 mg via ORAL

## 2019-11-29 MED ORDER — PROCHLORPERAZINE MALEATE 10 MG PO TABS
10.0000 mg | ORAL_TABLET | Freq: Once | ORAL | Status: AC
  Administered 2019-11-29: 10 mg via ORAL

## 2019-11-29 MED ORDER — DEXAMETHASONE 4 MG PO TABS
ORAL_TABLET | ORAL | Status: AC
  Filled 2019-11-29: qty 5

## 2019-11-29 MED ORDER — PROCHLORPERAZINE MALEATE 10 MG PO TABS
ORAL_TABLET | ORAL | Status: AC
  Filled 2019-11-29: qty 1

## 2019-11-29 NOTE — Patient Instructions (Signed)
[facility] Discharge Instructions for Patients Receiving Chemotherapy  Today you received the following chemotherapy agents: carfilzomib.  To help prevent nausea and vomiting after your treatment, we encourage you to take your nausea medication as directed.   If you develop nausea and vomiting that is not controlled by your nausea medication, call the clinic.   BELOW ARE SYMPTOMS THAT SHOULD BE REPORTED IMMEDIATELY:  *FEVER GREATER THAN 100.5 F  *CHILLS WITH OR WITHOUT FEVER  NAUSEA AND VOMITING THAT IS NOT CONTROLLED WITH YOUR NAUSEA MEDICATION  *UNUSUAL SHORTNESS OF BREATH  *UNUSUAL BRUISING OR BLEEDING  TENDERNESS IN MOUTH AND THROAT WITH OR WITHOUT PRESENCE OF ULCERS  *URINARY PROBLEMS  *BOWEL PROBLEMS  UNUSUAL RASH Items with * indicate a potential emergency and should be followed up as soon as possible.  Feel free to call the clinic should you have any questions or concerns. The clinic phone number is [phone].  Please show the CHEMO ALERT CARD at check-in to the Emergency Department and triage nurse.   

## 2019-11-29 NOTE — Progress Notes (Signed)
Per Dr. Stevphen Meuse to treat today w/platelets 76,000. Will only receive carfilzomib today.
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
## 2019-11-30 ENCOUNTER — Telehealth: Payer: Self-pay | Admitting: Oncology

## 2019-11-30 ENCOUNTER — Encounter: Payer: Self-pay | Admitting: Physician Assistant

## 2019-11-30 ENCOUNTER — Other Ambulatory Visit: Payer: Self-pay

## 2019-11-30 ENCOUNTER — Other Ambulatory Visit (HOSPITAL_COMMUNITY): Payer: Medicare PPO

## 2019-11-30 ENCOUNTER — Ambulatory Visit (INDEPENDENT_AMBULATORY_CARE_PROVIDER_SITE_OTHER): Payer: Medicare PPO | Admitting: Physician Assistant

## 2019-11-30 VITALS — BP 128/60 | HR 57 | Ht 71.0 in | Wt 178.0 lb

## 2019-11-30 DIAGNOSIS — I10 Essential (primary) hypertension: Secondary | ICD-10-CM | POA: Diagnosis not present

## 2019-11-30 DIAGNOSIS — I480 Paroxysmal atrial fibrillation: Secondary | ICD-10-CM

## 2019-11-30 DIAGNOSIS — Z952 Presence of prosthetic heart valve: Secondary | ICD-10-CM

## 2019-11-30 DIAGNOSIS — D696 Thrombocytopenia, unspecified: Secondary | ICD-10-CM

## 2019-11-30 DIAGNOSIS — I2510 Atherosclerotic heart disease of native coronary artery without angina pectoris: Secondary | ICD-10-CM | POA: Diagnosis not present

## 2019-11-30 LAB — PROTEIN ELECTROPHORESIS, SERUM
A/G Ratio: 1.5 (ref 0.7–1.7)
Albumin ELP: 3.1 g/dL (ref 2.9–4.4)
Alpha-1-Globulin: 0.3 g/dL (ref 0.0–0.4)
Alpha-2-Globulin: 0.7 g/dL (ref 0.4–1.0)
Beta Globulin: 0.9 g/dL (ref 0.7–1.3)
Gamma Globulin: 0.3 g/dL — ABNORMAL LOW (ref 0.4–1.8)
Globulin, Total: 2.1 g/dL — ABNORMAL LOW (ref 2.2–3.9)
Total Protein ELP: 5.2 g/dL — ABNORMAL LOW (ref 6.0–8.5)

## 2019-11-30 LAB — KAPPA/LAMBDA LIGHT CHAINS
Kappa free light chain: 1.8 mg/L — ABNORMAL LOW (ref 3.3–19.4)
Kappa, lambda light chain ratio: 0.1 — ABNORMAL LOW (ref 0.26–1.65)
Lambda free light chains: 17.7 mg/L (ref 5.7–26.3)

## 2019-11-30 LAB — IGA: IgA: 16 mg/dL — ABNORMAL LOW (ref 61–437)

## 2019-11-30 MED FILL — XARELTO 20 MG TABLET: 20 | 30 days supply | Qty: 30 | Fill #8

## 2019-11-30 NOTE — Telephone Encounter (Signed)
Scheduled appointments per 11/2 los. Spoke to patient who is aware of appointment date and time.

## 2019-11-30 NOTE — Patient Instructions (Signed)
Medication Instructions:  Your provider recommends that you continue on your current medications as directed. Please refer to the Current Medication list given to you today.   *If you need a refill on your cardiac medications before your next appointment, please call your pharmacy*  Follow-Up: You are scheduled with Dr. Burt Knack on 04/04/2020 at 2:20PM.

## 2019-12-02 ENCOUNTER — Telehealth (HOSPITAL_COMMUNITY): Payer: Self-pay

## 2019-12-02 NOTE — Telephone Encounter (Signed)
Called and spoke with pt in regards to CR, pt stated he is not interested at this time.   Closed referral 

## 2019-12-06 DIAGNOSIS — C9002 Multiple myeloma in relapse: Secondary | ICD-10-CM | POA: Diagnosis not present

## 2019-12-06 DIAGNOSIS — D801 Nonfamilial hypogammaglobulinemia: Secondary | ICD-10-CM | POA: Diagnosis not present

## 2019-12-06 DIAGNOSIS — D61810 Antineoplastic chemotherapy induced pancytopenia: Secondary | ICD-10-CM | POA: Diagnosis not present

## 2019-12-11 ENCOUNTER — Other Ambulatory Visit: Payer: Self-pay | Admitting: Oncology

## 2019-12-14 ENCOUNTER — Inpatient Hospital Stay: Payer: Medicare PPO

## 2019-12-14 ENCOUNTER — Other Ambulatory Visit: Payer: Self-pay

## 2019-12-14 ENCOUNTER — Inpatient Hospital Stay (HOSPITAL_BASED_OUTPATIENT_CLINIC_OR_DEPARTMENT_OTHER): Payer: Medicare PPO | Admitting: Nurse Practitioner

## 2019-12-14 ENCOUNTER — Encounter: Payer: Self-pay | Admitting: Nurse Practitioner

## 2019-12-14 VITALS — BP 128/70 | HR 61 | Temp 98.3°F | Resp 18 | Ht 71.0 in | Wt 176.0 lb

## 2019-12-14 DIAGNOSIS — Z79899 Other long term (current) drug therapy: Secondary | ICD-10-CM | POA: Diagnosis not present

## 2019-12-14 DIAGNOSIS — C9000 Multiple myeloma not having achieved remission: Secondary | ICD-10-CM | POA: Diagnosis not present

## 2019-12-14 DIAGNOSIS — Z923 Personal history of irradiation: Secondary | ICD-10-CM | POA: Diagnosis not present

## 2019-12-14 DIAGNOSIS — Z5111 Encounter for antineoplastic chemotherapy: Secondary | ICD-10-CM | POA: Diagnosis not present

## 2019-12-14 LAB — CBC WITH DIFFERENTIAL (CANCER CENTER ONLY)
Abs Immature Granulocytes: 0.01 10*3/uL (ref 0.00–0.07)
Basophils Absolute: 0 10*3/uL (ref 0.0–0.1)
Basophils Relative: 1 %
Eosinophils Absolute: 0.1 10*3/uL (ref 0.0–0.5)
Eosinophils Relative: 3 %
HCT: 40.8 % (ref 39.0–52.0)
Hemoglobin: 13 g/dL (ref 13.0–17.0)
Immature Granulocytes: 0 %
Lymphocytes Relative: 16 %
Lymphs Abs: 0.7 10*3/uL (ref 0.7–4.0)
MCH: 30 pg (ref 26.0–34.0)
MCHC: 31.9 g/dL (ref 30.0–36.0)
MCV: 94.2 fL (ref 80.0–100.0)
Monocytes Absolute: 0.5 10*3/uL (ref 0.1–1.0)
Monocytes Relative: 11 %
Neutro Abs: 3 10*3/uL (ref 1.7–7.7)
Neutrophils Relative %: 69 %
Platelet Count: 69 10*3/uL — ABNORMAL LOW (ref 150–400)
RBC: 4.33 MIL/uL (ref 4.22–5.81)
RDW: 12.7 % (ref 11.5–15.5)
WBC Count: 4.3 10*3/uL (ref 4.0–10.5)
nRBC: 0 % (ref 0.0–0.2)

## 2019-12-14 MED ORDER — ACETAMINOPHEN 325 MG PO TABS
ORAL_TABLET | ORAL | Status: AC
  Filled 2019-12-14: qty 2

## 2019-12-14 MED ORDER — MONTELUKAST SODIUM 10 MG PO TABS
ORAL_TABLET | ORAL | Status: AC
  Filled 2019-12-14: qty 1

## 2019-12-14 MED ORDER — DIPHENHYDRAMINE HCL 25 MG PO CAPS
ORAL_CAPSULE | ORAL | Status: AC
  Filled 2019-12-14: qty 2

## 2019-12-14 MED ORDER — PROCHLORPERAZINE MALEATE 10 MG PO TABS
10.0000 mg | ORAL_TABLET | Freq: Four times a day (QID) | ORAL | Status: DC | PRN
  Administered 2019-12-14: 10 mg via ORAL

## 2019-12-14 MED ORDER — ACETAMINOPHEN 325 MG PO TABS
650.0000 mg | ORAL_TABLET | Freq: Once | ORAL | Status: AC
  Administered 2019-12-14: 650 mg via ORAL

## 2019-12-14 MED ORDER — DARATUMUMAB-HYALURONIDASE-FIHJ 1800-30000 MG-UT/15ML ~~LOC~~ SOLN
1800.0000 mg | Freq: Once | SUBCUTANEOUS | Status: AC
  Administered 2019-12-14: 1800 mg via SUBCUTANEOUS
  Filled 2019-12-14: qty 15

## 2019-12-14 MED ORDER — DIPHENHYDRAMINE HCL 25 MG PO CAPS
50.0000 mg | ORAL_CAPSULE | Freq: Once | ORAL | Status: AC
  Administered 2019-12-14: 50 mg via ORAL

## 2019-12-14 MED ORDER — PROCHLORPERAZINE MALEATE 10 MG PO TABS
ORAL_TABLET | ORAL | Status: AC
  Filled 2019-12-14: qty 1

## 2019-12-14 MED ORDER — DEXAMETHASONE 4 MG PO TABS
ORAL_TABLET | ORAL | Status: AC
  Filled 2019-12-14: qty 5

## 2019-12-14 MED ORDER — MONTELUKAST SODIUM 10 MG PO TABS
10.0000 mg | ORAL_TABLET | Freq: Once | ORAL | Status: AC
  Administered 2019-12-14: 10 mg via ORAL

## 2019-12-14 MED ORDER — DEXAMETHASONE 4 MG PO TABS
20.0000 mg | ORAL_TABLET | Freq: Once | ORAL | Status: AC
  Administered 2019-12-14: 20 mg via ORAL

## 2019-12-14 MED ORDER — DEXTROSE 5 % IV SOLN
20.0000 mg/m2 | Freq: Once | INTRAVENOUS | Status: AC
  Administered 2019-12-14: 40 mg via INTRAVENOUS
  Filled 2019-12-14: qty 15

## 2019-12-14 MED ORDER — SODIUM CHLORIDE 0.9 % IV SOLN
INTRAVENOUS | Status: DC
  Filled 2019-12-14: qty 250

## 2019-12-14 NOTE — Progress Notes (Signed)
Per Lattie Haw, okay to treat with CBC results.  No CMP needed today per physician.
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
## 2019-12-14 NOTE — Patient Instructions (Signed)
[facility] Discharge Instructions for Patients Receiving Chemotherapy  Today you received the following chemotherapy agents: Kyprolis/Darzalex Faspro.  To help prevent nausea and vomiting after your treatment, we encourage you to take your nausea medication as directed.   If you develop nausea and vomiting that is not controlled by your nausea medication, call the clinic.   BELOW ARE SYMPTOMS THAT SHOULD BE REPORTED IMMEDIATELY:  *FEVER GREATER THAN 100.5 F  *CHILLS WITH OR WITHOUT FEVER  NAUSEA AND VOMITING THAT IS NOT CONTROLLED WITH YOUR NAUSEA MEDICATION  *UNUSUAL SHORTNESS OF BREATH  *UNUSUAL BRUISING OR BLEEDING  TENDERNESS IN MOUTH AND THROAT WITH OR WITHOUT PRESENCE OF ULCERS  *URINARY PROBLEMS  *BOWEL PROBLEMS  UNUSUAL RASH Items with * indicate a potential emergency and should be followed up as soon as possible.  Feel free to call the clinic should you have any questions or concerns. The clinic phone number is [phone].  Please show the CHEMO ALERT CARD at check-in to the Emergency Department and triage nurse.   

## 2019-12-14 NOTE — Addendum Note (Signed)
Addended by: Owens Shark on: 12/14/2019 01:23 PM   Modules accepted: Orders

## 2019-12-20 ENCOUNTER — Telehealth: Payer: Self-pay | Admitting: Nurse Practitioner

## 2019-12-20 NOTE — Telephone Encounter (Signed)
Scheduled appointments per 11/17 los. Spoke to patient who is aware of appointments dates and times.

## 2019-12-23 DIAGNOSIS — I10 Essential (primary) hypertension: Secondary | ICD-10-CM | POA: Diagnosis not present

## 2019-12-23 DIAGNOSIS — C61 Malignant neoplasm of prostate: Secondary | ICD-10-CM | POA: Diagnosis not present

## 2019-12-23 DIAGNOSIS — D61818 Other pancytopenia: Secondary | ICD-10-CM | POA: Diagnosis not present

## 2019-12-23 DIAGNOSIS — N400 Enlarged prostate without lower urinary tract symptoms: Secondary | ICD-10-CM | POA: Diagnosis not present

## 2019-12-23 DIAGNOSIS — D649 Anemia, unspecified: Secondary | ICD-10-CM | POA: Diagnosis not present

## 2019-12-23 DIAGNOSIS — I4891 Unspecified atrial fibrillation: Secondary | ICD-10-CM | POA: Diagnosis not present

## 2019-12-24 ENCOUNTER — Other Ambulatory Visit: Payer: Self-pay | Admitting: Oncology
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
## 2019-12-26 ENCOUNTER — Other Ambulatory Visit: Payer: Self-pay

## 2019-12-26 ENCOUNTER — Inpatient Hospital Stay: Payer: Medicare PPO

## 2019-12-26 ENCOUNTER — Other Ambulatory Visit: Payer: Medicare PPO

## 2019-12-26 ENCOUNTER — Inpatient Hospital Stay (HOSPITAL_BASED_OUTPATIENT_CLINIC_OR_DEPARTMENT_OTHER): Payer: Medicare PPO | Admitting: Nurse Practitioner

## 2019-12-26 VITALS — BP 123/69 | HR 92 | Temp 97.9°F | Resp 18 | Ht 71.0 in | Wt 176.5 lb

## 2019-12-26 DIAGNOSIS — C9000 Multiple myeloma not having achieved remission: Secondary | ICD-10-CM

## 2019-12-26 DIAGNOSIS — Z5111 Encounter for antineoplastic chemotherapy: Secondary | ICD-10-CM | POA: Diagnosis not present

## 2019-12-26 DIAGNOSIS — Z79899 Other long term (current) drug therapy: Secondary | ICD-10-CM | POA: Diagnosis not present

## 2019-12-26 DIAGNOSIS — Z923 Personal history of irradiation: Secondary | ICD-10-CM | POA: Diagnosis not present

## 2019-12-26 DIAGNOSIS — I480 Paroxysmal atrial fibrillation: Secondary | ICD-10-CM | POA: Diagnosis not present

## 2019-12-26 LAB — CMP (CANCER CENTER ONLY)
ALT: 14 U/L (ref 0–44)
AST: 14 U/L — ABNORMAL LOW (ref 15–41)
Albumin: 3 g/dL — ABNORMAL LOW (ref 3.5–5.0)
Alkaline Phosphatase: 54 U/L (ref 38–126)
Anion gap: 10 (ref 5–15)
BUN: 14 mg/dL (ref 8–23)
CO2: 23 mmol/L (ref 22–32)
Calcium: 9.1 mg/dL (ref 8.9–10.3)
Chloride: 108 mmol/L (ref 98–111)
Creatinine: 0.77 mg/dL (ref 0.61–1.24)
GFR, Estimated: >60 mL/min (ref >60)
Glucose, Bld: 110 mg/dL — ABNORMAL HIGH (ref 70–99)
Potassium: 3.8 mmol/L (ref 3.5–5.1)
Sodium: 141 mmol/L (ref 135–145)
Total Bilirubin: 0.8 mg/dL (ref 0.3–1.2)
Total Protein: 5.9 g/dL — ABNORMAL LOW (ref 6.5–8.1)

## 2019-12-26 LAB — CBC WITH DIFFERENTIAL (CANCER CENTER ONLY)
Abs Immature Granulocytes: 0.02 10*3/uL (ref 0.00–0.07)
Basophils Absolute: 0 10*3/uL (ref 0.0–0.1)
Basophils Relative: 1 %
Eosinophils Absolute: 0.1 10*3/uL (ref 0.0–0.5)
Eosinophils Relative: 2 %
HCT: 37.5 % — ABNORMAL LOW (ref 39.0–52.0)
Hemoglobin: 12 g/dL — ABNORMAL LOW (ref 13.0–17.0)
Immature Granulocytes: 0 %
Lymphocytes Relative: 11 %
Lymphs Abs: 0.5 10*3/uL — ABNORMAL LOW (ref 0.7–4.0)
MCH: 29.3 pg (ref 26.0–34.0)
MCHC: 32 g/dL (ref 30.0–36.0)
MCV: 91.5 fL (ref 80.0–100.0)
Monocytes Absolute: 0.5 10*3/uL (ref 0.1–1.0)
Monocytes Relative: 10 %
Neutro Abs: 3.9 10*3/uL (ref 1.7–7.7)
Neutrophils Relative %: 76 %
Platelet Count: 76 10*3/uL — ABNORMAL LOW (ref 150–400)
RBC: 4.1 MIL/uL — ABNORMAL LOW (ref 4.22–5.81)
RDW: 13 % (ref 11.5–15.5)
WBC Count: 5 10*3/uL (ref 4.0–10.5)
nRBC: 0 % (ref 0.0–0.2)

## 2019-12-27 ENCOUNTER — Inpatient Hospital Stay: Payer: Medicare PPO

## 2019-12-27 ENCOUNTER — Other Ambulatory Visit: Payer: Self-pay

## 2019-12-27 ENCOUNTER — Encounter: Payer: Self-pay | Admitting: Nurse Practitioner

## 2019-12-27 VITALS — BP 107/64 | HR 77 | Temp 98.2°F | Resp 18

## 2019-12-27 DIAGNOSIS — Z923 Personal history of irradiation: Secondary | ICD-10-CM | POA: Diagnosis not present

## 2019-12-27 DIAGNOSIS — C9000 Multiple myeloma not having achieved remission: Secondary | ICD-10-CM

## 2019-12-27 DIAGNOSIS — Z5111 Encounter for antineoplastic chemotherapy: Secondary | ICD-10-CM | POA: Diagnosis not present

## 2019-12-27 DIAGNOSIS — Z79899 Other long term (current) drug therapy: Secondary | ICD-10-CM | POA: Diagnosis not present

## 2019-12-27 LAB — PROTEIN ELECTROPHORESIS, SERUM
A/G Ratio: 1.3 (ref 0.7–1.7)
Albumin ELP: 2.9 g/dL (ref 2.9–4.4)
Alpha-1-Globulin: 0.4 g/dL (ref 0.0–0.4)
Alpha-2-Globulin: 0.8 g/dL (ref 0.4–1.0)
Beta Globulin: 0.9 g/dL (ref 0.7–1.3)
Gamma Globulin: 0.2 g/dL — ABNORMAL LOW (ref 0.4–1.8)
Globulin, Total: 2.3 g/dL (ref 2.2–3.9)
Total Protein ELP: 5.2 g/dL — ABNORMAL LOW (ref 6.0–8.5)

## 2019-12-27 LAB — KAPPA/LAMBDA LIGHT CHAINS
Kappa free light chain: 1.7 mg/L — ABNORMAL LOW (ref 3.3–19.4)
Kappa, lambda light chain ratio: 0.06 — ABNORMAL LOW (ref 0.26–1.65)
Lambda free light chains: 29 mg/L — ABNORMAL HIGH (ref 5.7–26.3)

## 2019-12-27 LAB — IGA: IgA: 17 mg/dL — ABNORMAL LOW (ref 61–437)

## 2019-12-27 MED ORDER — PROCHLORPERAZINE MALEATE 10 MG PO TABS
ORAL_TABLET | ORAL | Status: AC
  Filled 2019-12-27: qty 1

## 2019-12-27 MED ORDER — SODIUM CHLORIDE 0.9 % IV SOLN
INTRAVENOUS | Status: DC
  Filled 2019-12-27: qty 250

## 2019-12-27 MED ORDER — DEXTROSE 5 % IV SOLN
20.0000 mg/m2 | Freq: Once | INTRAVENOUS | Status: AC
  Administered 2019-12-27: 40 mg via INTRAVENOUS
  Filled 2019-12-27: qty 15

## 2019-12-27 MED ORDER — DEXAMETHASONE 4 MG PO TABS
20.0000 mg | ORAL_TABLET | Freq: Once | ORAL | Status: AC
  Administered 2019-12-27: 20 mg via ORAL

## 2019-12-27 MED ORDER — DIPHENHYDRAMINE HCL 25 MG PO CAPS
50.0000 mg | ORAL_CAPSULE | Freq: Once | ORAL | Status: AC
  Administered 2019-12-27: 50 mg via ORAL

## 2019-12-27 MED ORDER — MONTELUKAST SODIUM 10 MG PO TABS
ORAL_TABLET | ORAL | Status: AC
  Filled 2019-12-27: qty 1

## 2019-12-27 MED ORDER — ACETAMINOPHEN 325 MG PO TABS
ORAL_TABLET | ORAL | Status: AC
  Filled 2019-12-27: qty 2

## 2019-12-27 MED ORDER — DARATUMUMAB-HYALURONIDASE-FIHJ 1800-30000 MG-UT/15ML ~~LOC~~ SOLN
1800.0000 mg | Freq: Once | SUBCUTANEOUS | Status: AC
  Administered 2019-12-27: 1800 mg via SUBCUTANEOUS
  Filled 2019-12-27: qty 15

## 2019-12-27 MED ORDER — ACETAMINOPHEN 325 MG PO TABS
650.0000 mg | ORAL_TABLET | Freq: Once | ORAL | Status: AC
  Administered 2019-12-27: 650 mg via ORAL

## 2019-12-27 MED ORDER — MONTELUKAST SODIUM 10 MG PO TABS
10.0000 mg | ORAL_TABLET | Freq: Once | ORAL | Status: AC
  Administered 2019-12-27: 10 mg via ORAL

## 2019-12-27 MED ORDER — DIPHENHYDRAMINE HCL 25 MG PO CAPS
ORAL_CAPSULE | ORAL | Status: AC
  Filled 2019-12-27: qty 2

## 2019-12-27 MED ORDER — DEXAMETHASONE 4 MG PO TABS
ORAL_TABLET | ORAL | Status: AC
  Filled 2019-12-27: qty 5

## 2019-12-27 MED ORDER — PROCHLORPERAZINE MALEATE 10 MG PO TABS
10.0000 mg | ORAL_TABLET | Freq: Once | ORAL | Status: AC
  Administered 2019-12-27: 10 mg via ORAL

## 2019-12-27 NOTE — Progress Notes (Signed)
Pt. Discharged in stable condition.

## 2019-12-27 NOTE — Progress Notes (Signed)
Ok to treat per [name] with Plt 76.

## 2019-12-27 NOTE — Patient Instructions (Signed)
[facility] Discharge Instructions for Patients Receiving Chemotherapy  Today you received the following chemotherapy agents carfilzomib, darzalex.   To help prevent nausea and vomiting after your treatment, we encourage you to take your nausea medication as directed.    If you develop nausea and vomiting that is not controlled by your nausea medication, call the clinic.   BELOW ARE SYMPTOMS THAT SHOULD BE REPORTED IMMEDIATELY:  *FEVER GREATER THAN 100.5 F  *CHILLS WITH OR WITHOUT FEVER  NAUSEA AND VOMITING THAT IS NOT CONTROLLED WITH YOUR NAUSEA MEDICATION  *UNUSUAL SHORTNESS OF BREATH  *UNUSUAL BRUISING OR BLEEDING  TENDERNESS IN MOUTH AND THROAT WITH OR WITHOUT PRESENCE OF ULCERS  *URINARY PROBLEMS  *BOWEL PROBLEMS  UNUSUAL RASH Items with * indicate a potential emergency and should be followed up as soon as possible.  Feel free to call the clinic should you have any questions or concerns. The clinic phone number is [phone].  Please show the [place] at check-in to the Emergency Department and triage nurse.

## 2019-12-28 ENCOUNTER — Telehealth: Payer: Self-pay | Admitting: Oncology

## 2019-12-28 MED FILL — XARELTO 20 MG TABLET: 20 | 30 days supply | Qty: 30 | Fill #9

## 2019-12-28 NOTE — Telephone Encounter (Signed)
Scheduled appointments per 11/30 sch msg. Spoke to patient who is aware of appointments dates and times.

## 2020-01-08 ENCOUNTER — Other Ambulatory Visit: Payer: Self-pay | Admitting: Oncology

## 2020-01-11 ENCOUNTER — Inpatient Hospital Stay: Payer: Medicare PPO | Attending: Oncology

## 2020-01-11 ENCOUNTER — Inpatient Hospital Stay (HOSPITAL_BASED_OUTPATIENT_CLINIC_OR_DEPARTMENT_OTHER): Payer: Medicare PPO | Admitting: Nurse Practitioner

## 2020-01-11 ENCOUNTER — Ambulatory Visit: Payer: Medicare PPO | Admitting: Oncology

## 2020-01-11 ENCOUNTER — Inpatient Hospital Stay: Payer: Medicare PPO

## 2020-01-11 ENCOUNTER — Other Ambulatory Visit: Payer: Self-pay

## 2020-01-11 ENCOUNTER — Encounter: Payer: Self-pay | Admitting: Nurse Practitioner

## 2020-01-11 ENCOUNTER — Ambulatory Visit: Payer: Medicare PPO

## 2020-01-11 ENCOUNTER — Other Ambulatory Visit: Payer: Medicare PPO

## 2020-01-11 VITALS — BP 137/80 | HR 85 | Temp 97.9°F | Resp 16 | Ht 71.0 in | Wt 177.4 lb

## 2020-01-11 DIAGNOSIS — Z79899 Other long term (current) drug therapy: Secondary | ICD-10-CM | POA: Insufficient documentation

## 2020-01-11 DIAGNOSIS — C9000 Multiple myeloma not having achieved remission: Secondary | ICD-10-CM

## 2020-01-11 DIAGNOSIS — L309 Dermatitis, unspecified: Secondary | ICD-10-CM | POA: Diagnosis not present

## 2020-01-11 DIAGNOSIS — Z5111 Encounter for antineoplastic chemotherapy: Secondary | ICD-10-CM | POA: Insufficient documentation

## 2020-01-11 DIAGNOSIS — M25511 Pain in right shoulder: Secondary | ICD-10-CM | POA: Insufficient documentation

## 2020-01-11 DIAGNOSIS — Z923 Personal history of irradiation: Secondary | ICD-10-CM | POA: Insufficient documentation

## 2020-01-11 DIAGNOSIS — Z8546 Personal history of malignant neoplasm of prostate: Secondary | ICD-10-CM | POA: Insufficient documentation

## 2020-01-11 LAB — CBC WITH DIFFERENTIAL (CANCER CENTER ONLY)
Abs Immature Granulocytes: 0.01 10*3/uL (ref 0.00–0.07)
Basophils Absolute: 0 10*3/uL (ref 0.0–0.1)
Basophils Relative: 1 %
Eosinophils Absolute: 0 10*3/uL (ref 0.0–0.5)
Eosinophils Relative: 1 %
HCT: 40.9 % (ref 39.0–52.0)
Hemoglobin: 12.7 g/dL — ABNORMAL LOW (ref 13.0–17.0)
Immature Granulocytes: 0 %
Lymphocytes Relative: 11 %
Lymphs Abs: 0.4 10*3/uL — ABNORMAL LOW (ref 0.7–4.0)
MCH: 28 pg (ref 26.0–34.0)
MCHC: 31.1 g/dL (ref 30.0–36.0)
MCV: 90.3 fL (ref 80.0–100.0)
Monocytes Absolute: 0.5 10*3/uL (ref 0.1–1.0)
Monocytes Relative: 12 %
Neutro Abs: 3.1 10*3/uL (ref 1.7–7.7)
Neutrophils Relative %: 75 %
Platelet Count: 105 10*3/uL — ABNORMAL LOW (ref 150–400)
RBC: 4.53 MIL/uL (ref 4.22–5.81)
RDW: 13.4 % (ref 11.5–15.5)
WBC Count: 4.1 10*3/uL (ref 4.0–10.5)
nRBC: 0 % (ref 0.0–0.2)

## 2020-01-11 LAB — CMP (CANCER CENTER ONLY)
ALT: 12 U/L (ref 0–44)
AST: 15 U/L (ref 15–41)
Albumin: 3.6 g/dL (ref 3.5–5.0)
Alkaline Phosphatase: 50 U/L (ref 38–126)
Anion gap: 9 (ref 5–15)
BUN: 8 mg/dL (ref 8–23)
CO2: 29 mmol/L (ref 22–32)
Calcium: 9.4 mg/dL (ref 8.9–10.3)
Chloride: 108 mmol/L (ref 98–111)
Creatinine: 0.76 mg/dL (ref 0.61–1.24)
GFR, Estimated: >60 mL/min (ref >60)
Glucose, Bld: 97 mg/dL (ref 70–99)
Potassium: 4.1 mmol/L (ref 3.5–5.1)
Sodium: 146 mmol/L — ABNORMAL HIGH (ref 135–145)
Total Bilirubin: 0.7 mg/dL (ref 0.3–1.2)
Total Protein: 6.4 g/dL — ABNORMAL LOW (ref 6.5–8.1)

## 2020-01-11 MED ORDER — PROCHLORPERAZINE MALEATE 10 MG PO TABS: 10.0000 mg | ORAL_TABLET | Freq: Four times a day (QID) | ORAL | Status: DC | PRN

## 2020-01-11 MED ORDER — DIPHENHYDRAMINE HCL 25 MG PO CAPS
50.0000 mg | ORAL_CAPSULE | Freq: Once | ORAL | Status: AC
  Administered 2020-01-11: 14:00:00 50 mg via ORAL

## 2020-01-11 MED ORDER — DEXAMETHASONE 4 MG PO TABS
20.0000 mg | ORAL_TABLET | Freq: Once | ORAL | Status: AC
  Administered 2020-01-11: 14:00:00 20 mg via ORAL

## 2020-01-11 MED ORDER — DARATUMUMAB-HYALURONIDASE-FIHJ 1800-30000 MG-UT/15ML ~~LOC~~ SOLN
1800.0000 mg | Freq: Once | SUBCUTANEOUS | Status: AC
Start: 2020-01-11 — End: 2020-01-11
  Administered 2020-01-11: 15:00:00 1800 mg via SUBCUTANEOUS
  Filled 2020-01-11: qty 15

## 2020-01-11 MED ORDER — MONTELUKAST SODIUM 10 MG PO TABS
ORAL_TABLET | ORAL | Status: AC
  Filled 2020-01-11: qty 1

## 2020-01-11 MED ORDER — DEXTROSE 5 % IV SOLN
20.0000 mg/m2 | Freq: Once | INTRAVENOUS | Status: AC
  Administered 2020-01-11: 14:00:00 40 mg via INTRAVENOUS
  Filled 2020-01-11: qty 5

## 2020-01-11 MED ORDER — SODIUM CHLORIDE 0.9 % IV SOLN
INTRAVENOUS | Status: DC
  Filled 2020-01-11: qty 250

## 2020-01-11 MED ORDER — MONTELUKAST SODIUM 10 MG PO TABS
10.0000 mg | ORAL_TABLET | Freq: Once | ORAL | Status: AC
  Administered 2020-01-11: 14:00:00 10 mg via ORAL

## 2020-01-11 MED ORDER — DIPHENHYDRAMINE HCL 25 MG PO CAPS
ORAL_CAPSULE | ORAL | Status: AC
  Filled 2020-01-11: qty 2

## 2020-01-11 MED ORDER — DEXAMETHASONE 4 MG PO TABS
ORAL_TABLET | ORAL | Status: AC
  Filled 2020-01-11: qty 5

## 2020-01-11 MED ORDER — ACETAMINOPHEN 325 MG PO TABS
650.0000 mg | ORAL_TABLET | Freq: Once | ORAL | Status: AC
Start: 2020-01-11 — End: 2020-01-11
  Administered 2020-01-11: 14:00:00 650 mg via ORAL

## 2020-01-11 MED ORDER — ACETAMINOPHEN 325 MG PO TABS
ORAL_TABLET | ORAL | Status: AC
  Filled 2020-01-11: qty 2

## 2020-01-11 NOTE — Progress Notes (Signed)
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  He completed a cycle of daratumumab/carfilzomib 12/27/2019.  He reports now consistent pain along the right side in an area of previous pain.  No rash or other skin change.  He is taking Tylenol 500 mg twice daily.  The pain is worse as compared to 2 weeks ago.  He is limiting his activity.  He denies back pain.  No leg weakness or numbness.  No bowel or bladder dysfunction.  He is no longer having right shoulder pain.  No fever or cough.  No shortness of breath.  For the past few weeks he feels as if his eyes are intermittently "out of sync".  No diplopia.  No headache.  No neck pain.  Objective:  Vital signs in last 24 hours:  Blood pressure 137/80, pulse 85, temperature 97.9 F (36.6 C), temperature source Tympanic, resp. rate 16, height '5\' 11"'  (1.803 m), weight 177 lb 6.4 oz (80.5 kg), SpO2 100 %.    Resp: Lungs clear bilaterally. Cardio: Regular rate and rhythm. GI: Abdomen soft and nontender.  No hepatosplenomegaly. Vascular: Trace edema lower legs bilaterally left slightly greater than right. Neuro: PERRLA.  Extraocular movements intact.  Face is symmetric.  Tongue midline, sensation grossly intact.  Motor strength 5/5. Musculoskeletal: Tender right low posterior lateral chest wall/ribs.  No chest wall mass.  No rash.   Lab Results:  Lab Results  Component Value Date   WBC 4.1 01/11/2020   HGB 12.7 (L) 01/11/2020   HCT 40.9 01/11/2020   MCV 90.3 01/11/2020   PLT 105 (L) 01/11/2020   NEUTROABS 3.1 01/11/2020    Imaging:  No results found.  Medications: I have reviewed the patient's current medications.  Assessment/Plan: 1. Multiple myeloma-confirmed on a bone marrow biopsy 08/07/2014, IgA lambda  Serum M spike and increased serum free lambda light chains  Myeloma FISH panel negative for chromosome 4, 11, 12, 13, 14, and 17  abnormalities, cytogenetics with no metaphases  Bone survey 08/15/2014 with indeterminant lucent skull lesions  Cycle 1 RVD 08/22/2014  Cycle 2 RVD 09/19/2014  Cycle 3 RVD 10/17/2014  Cycle 4 RVD 11/14/2014 (Decadron reduced to 20 mg weekly, Revlimid 15 mg days 1 through 14, Velcade 3/4 weeks)  Cycle 5 RVD 12/12/2014  Serum M spike not detected 12/26/2014  Cycle 6 RVD 01/09/2015  Maintenance Revlimid, 10 mg daily, 02/05/2015 , discontinue 02/26/2015 secondary to leg edema and diarrhea  Revlimid resumed at a dose of 10 mg every other day beginning 03/13/2015  PET scan 41/32/4401-UU hypermetabolic bone lesions, few lucent lesions in the spine and pelvis  Revlimid discontinued January 2019  Enrollment on a clinical trial at [facility] with pomalidomide/Decadron+/- ixazomibbeginning 03/10/2017  Treatment discontinued February 2021 secondary to rising serum lambda light chains  PET 04/05/2019-hypermetabolic right axillary/chest wall nodes, right paravertebral mass, small right pleural effusion, retrocrural node, right lateral abdominal wall mass, L5 lesion mild compression deformity. Focus of hypermetabolism at the anterior left pelvic wall without a CT correlate. Hypermetabolic lytic lesion in the left T3 transverse process  Cycle 1 daratumumab/carfilzomib/Decadron 04/05/2019  Radiation to the right chest wall mass and lumbar spine beginning 04/25/2019  Cycle 2 daratumumab/carfilzomib/Decadron 05/04/2019 (carfilzomib dose escalated); day 8 held due to neutropenia, thrombocytopenia; day 15 05/18/2019 (carfilzomib dose reduced)  Daratumumab/carfilzomib/Decadron changed to every 2-week dosing beginning 05/25/2019  Daratumumab changed to a monthly schedule beginning 09/21/2019; carfilzomib every 2 weeks  Daratumumab held 11/15/2019 secondary to thrombocytopenia, carfilzomib continued at a reduced dose  CT chest 11/19/2019-T10 paraspinous mass enlarged compared to 09/13/2019, other bone lesions  unchanged  Daratumumab/carfilzomib/Decadron 12/14/2019  Daratumumab/carfilzomib/Decadron 12/27/2019  Daratumumab/carfilzomib/Decadron 01/11/2020  2. 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 History of intermittent prostatitis  4. Skin rash 10/24/2014-referred to dermatology, biopsy consistent with local hypersensitivity reaction  5. 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 Left lower leg cellulitis 06/24/2015-treated with Keflex  7. History of mild thrombocytopenia secondary tomyeloma and systemic therapy  8. History of mild neutropenia-secondary to myeloma and systemic therapy  9. Fall with a skull fracture and subarachnoid blood/right frontal lobe contusions 10/17/2015  10. Right lower lobe pulmonary embolus 03/28/2016. Lovenox , transitioned to Xarelto  11. Vesicular rashJune 2018-potentially related to the zoster vaccine, resolved  12. History of atrial fibrillation  13. Aortic stenosis-TAVR 10/25/2019  14. 07/02/2019-hospital admission for sepsis secondary to cellulitis of the left lower extremity with gram-negative rod bacteremia(Aeromonascaviae)  15. Pleuritic chest pain - onset 10/2019, cardiac w/u negative   16. Right shoulder pain, possible bone spur on 04/2019 PET, likely MSK related (12/26/19)  17. Eczematous rash (12/26/19),  not felt to be drug rash/allergy. 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  Plan to proceed with treatment today as scheduled.  We reviewed the CBC from today.  Counts adequate to proceed with treatment.  Of note, the platelet count is better.  He is  having increased pain at the right posterior lateral chest wall with tenderness on exam.  We discussed possible etiologies including progression of myeloma, rib fracture.  The pain seems to be focal, not radicular.  We are referring him for a restaging PET scan.  He will contact the office if he needs something stronger for pain than the Tylenol he is currently taking.  We discussed the eye symptoms.  No abnormality of eye movement on examination.  He understands to contact the office if symptoms persist or he develops new symptoms such as diplopia.  He is leaving to go out of town with family on 01/16/2020, returning 01/23/2020.  We will try to get the PET scan scheduled 01/24/2020 and see him in follow-up later that same day.  He understands to contact the office in the interim as outlined above or with any other problems.  Plan reviewed with [name] by telephone.    Ned Card ANP/GNP-BC   01/11/2020  11:36 AM

## 2020-01-11 NOTE — Patient Instructions (Signed)
[facility] Discharge Instructions for Patients Receiving Chemotherapy  Today you received the following chemotherapy agents carfilzomib, darzalex.   To help prevent nausea and vomiting after your treatment, we encourage you to take your nausea medication as directed.    If you develop nausea and vomiting that is not controlled by your nausea medication, call the clinic.   BELOW ARE SYMPTOMS THAT SHOULD BE REPORTED IMMEDIATELY:  *FEVER GREATER THAN 100.5 F  *CHILLS WITH OR WITHOUT FEVER  NAUSEA AND VOMITING THAT IS NOT CONTROLLED WITH YOUR NAUSEA MEDICATION  *UNUSUAL SHORTNESS OF BREATH  *UNUSUAL BRUISING OR BLEEDING  TENDERNESS IN MOUTH AND THROAT WITH OR WITHOUT PRESENCE OF ULCERS  *URINARY PROBLEMS  *BOWEL PROBLEMS  UNUSUAL RASH Items with * indicate a potential emergency and should be followed up as soon as possible.  Feel free to call the clinic should you have any questions or concerns. The clinic phone number is [phone].  Please show the [facility] at check-in to the Emergency Department and triage nurse.

## 2020-01-12 ENCOUNTER — Telehealth: Payer: Self-pay | Admitting: Nurse Practitioner

## 2020-01-12 NOTE — Telephone Encounter (Signed)
Scheduled appointments per 12/15 los. Spoke to patient who is aware of appointments dates and times.

## 2020-01-22 ENCOUNTER — Other Ambulatory Visit: Payer: Self-pay | Admitting: Oncology

## 2020-01-24 ENCOUNTER — Ambulatory Visit (HOSPITAL_COMMUNITY)
Admission: RE | Admit: 2020-01-24 | Discharge: 2020-01-24 | Disposition: A | Discharge status: Home / Self Care | Payer: Medicare PPO | Source: Ambulatory Visit | Attending: Nurse Practitioner | Admitting: Nurse Practitioner

## 2020-01-24 ENCOUNTER — Inpatient Hospital Stay: Payer: Medicare PPO

## 2020-01-24 ENCOUNTER — Encounter: Payer: Self-pay | Admitting: Radiation Oncology

## 2020-01-24 ENCOUNTER — Other Ambulatory Visit: Payer: Medicare PPO

## 2020-01-24 ENCOUNTER — Inpatient Hospital Stay (HOSPITAL_BASED_OUTPATIENT_CLINIC_OR_DEPARTMENT_OTHER): Payer: Medicare PPO | Admitting: Oncology

## 2020-01-24 ENCOUNTER — Other Ambulatory Visit: Payer: Self-pay | Admitting: Oncology

## 2020-01-24 ENCOUNTER — Other Ambulatory Visit: Payer: Self-pay

## 2020-01-24 VITALS — BP 124/70 | HR 78 | Temp 97.0°F | Resp 16 | Ht 71.0 in | Wt 182.1 lb

## 2020-01-24 DIAGNOSIS — C9000 Multiple myeloma not having achieved remission: Secondary | ICD-10-CM | POA: Insufficient documentation

## 2020-01-24 DIAGNOSIS — Z79899 Other long term (current) drug therapy: Secondary | ICD-10-CM | POA: Diagnosis not present

## 2020-01-24 DIAGNOSIS — Z5111 Encounter for antineoplastic chemotherapy: Secondary | ICD-10-CM | POA: Diagnosis not present

## 2020-01-24 DIAGNOSIS — Z8546 Personal history of malignant neoplasm of prostate: Secondary | ICD-10-CM | POA: Diagnosis not present

## 2020-01-24 DIAGNOSIS — I313 Pericardial effusion (noninflammatory): Secondary | ICD-10-CM | POA: Diagnosis not present

## 2020-01-24 DIAGNOSIS — Z923 Personal history of irradiation: Secondary | ICD-10-CM | POA: Diagnosis not present

## 2020-01-24 DIAGNOSIS — J90 Pleural effusion, not elsewhere classified: Secondary | ICD-10-CM | POA: Insufficient documentation

## 2020-01-24 DIAGNOSIS — R599 Enlarged lymph nodes, unspecified: Secondary | ICD-10-CM | POA: Insufficient documentation

## 2020-01-24 DIAGNOSIS — L309 Dermatitis, unspecified: Secondary | ICD-10-CM | POA: Diagnosis not present

## 2020-01-24 DIAGNOSIS — I2510 Atherosclerotic heart disease of native coronary artery without angina pectoris: Secondary | ICD-10-CM | POA: Insufficient documentation

## 2020-01-24 DIAGNOSIS — R609 Edema, unspecified: Secondary | ICD-10-CM | POA: Insufficient documentation

## 2020-01-24 DIAGNOSIS — M25511 Pain in right shoulder: Secondary | ICD-10-CM | POA: Diagnosis not present

## 2020-01-24 LAB — CBC WITH DIFFERENTIAL (CANCER CENTER ONLY)
Abs Immature Granulocytes: 0.02 10*3/uL (ref 0.00–0.07)
Basophils Absolute: 0 10*3/uL (ref 0.0–0.1)
Basophils Relative: 1 %
Eosinophils Absolute: 0 10*3/uL (ref 0.0–0.5)
Eosinophils Relative: 1 %
HCT: 38.6 % — ABNORMAL LOW (ref 39.0–52.0)
Hemoglobin: 12.1 g/dL — ABNORMAL LOW (ref 13.0–17.0)
Immature Granulocytes: 1 %
Lymphocytes Relative: 11 %
Lymphs Abs: 0.5 10*3/uL — ABNORMAL LOW (ref 0.7–4.0)
MCH: 27.6 pg (ref 26.0–34.0)
MCHC: 31.3 g/dL (ref 30.0–36.0)
MCV: 87.9 fL (ref 80.0–100.0)
Monocytes Absolute: 0.5 10*3/uL (ref 0.1–1.0)
Monocytes Relative: 11 %
Neutro Abs: 3.4 10*3/uL (ref 1.7–7.7)
Neutrophils Relative %: 75 %
Platelet Count: 72 10*3/uL — ABNORMAL LOW (ref 150–400)
RBC: 4.39 MIL/uL (ref 4.22–5.81)
RDW: 14.3 % (ref 11.5–15.5)
WBC Count: 4.4 10*3/uL (ref 4.0–10.5)
nRBC: 0 % (ref 0.0–0.2)

## 2020-01-24 LAB — CMP (CANCER CENTER ONLY)
ALT: 10 U/L (ref 0–44)
AST: 14 U/L — ABNORMAL LOW (ref 15–41)
Albumin: 3.3 g/dL — ABNORMAL LOW (ref 3.5–5.0)
Alkaline Phosphatase: 46 U/L (ref 38–126)
Anion gap: 7 (ref 5–15)
BUN: 15 mg/dL (ref 8–23)
CO2: 27 mmol/L (ref 22–32)
Calcium: 9.2 mg/dL (ref 8.9–10.3)
Chloride: 109 mmol/L (ref 98–111)
Creatinine: 0.89 mg/dL (ref 0.61–1.24)
GFR, Estimated: >60 mL/min (ref >60)
Glucose, Bld: 90 mg/dL (ref 70–99)
Potassium: 4.1 mmol/L (ref 3.5–5.1)
Sodium: 143 mmol/L (ref 135–145)
Total Bilirubin: 0.6 mg/dL (ref 0.3–1.2)
Total Protein: 6 g/dL — ABNORMAL LOW (ref 6.5–8.1)

## 2020-01-24 LAB — GLUCOSE, CAPILLARY: Glucose-Capillary: 98 mg/dL (ref 70–99)

## 2020-01-24 MED ORDER — SODIUM CHLORIDE 0.9 % IV SOLN
INTRAVENOUS | Status: DC
  Filled 2020-01-24: qty 250

## 2020-01-24 MED ORDER — TRAMADOL-ACETAMINOPHEN 37.5-325 MG PO TABS: 1.0000 | ORAL_TABLET | Freq: Four times a day (QID) | ORAL | 0 refills | Status: DC | PRN

## 2020-01-24 MED ORDER — PROCHLORPERAZINE MALEATE 10 MG PO TABS
ORAL_TABLET | ORAL | Status: AC
  Filled 2020-01-24: qty 1

## 2020-01-24 MED ORDER — ACETAMINOPHEN 325 MG PO TABS
ORAL_TABLET | ORAL | Status: AC
  Filled 2020-01-24: qty 2

## 2020-01-24 MED ORDER — DIPHENHYDRAMINE HCL 25 MG PO CAPS
ORAL_CAPSULE | ORAL | Status: AC
  Filled 2020-01-24: qty 2

## 2020-01-24 MED ORDER — DEXAMETHASONE 4 MG PO TABS
ORAL_TABLET | ORAL | Status: AC
  Filled 2020-01-24: qty 5

## 2020-01-24 MED ORDER — FLUDEOXYGLUCOSE F - 18 (FDG) INJECTION
8.6000 | Freq: Once | INTRAVENOUS | Status: AC | PRN
  Administered 2020-01-24: 07:00:00 8.6 via INTRAVENOUS

## 2020-01-24 MED ORDER — ACETAMINOPHEN 325 MG PO TABS
650.0000 mg | ORAL_TABLET | Freq: Once | ORAL | Status: AC
  Administered 2020-01-24: 12:00:00 650 mg via ORAL

## 2020-01-24 MED ORDER — MONTELUKAST SODIUM 10 MG PO TABS
ORAL_TABLET | ORAL | Status: AC
  Filled 2020-01-24: qty 1

## 2020-01-24 MED ORDER — ZOLEDRONIC ACID 4 MG/100ML IV SOLN
4.0000 mg | Freq: Once | INTRAVENOUS | Status: AC
Start: 2020-01-24 — End: 2020-01-24
  Administered 2020-01-24: 12:00:00 4 mg via INTRAVENOUS

## 2020-01-24 MED ORDER — ZOLEDRONIC ACID 4 MG/100ML IV SOLN
INTRAVENOUS | Status: AC
  Filled 2020-01-24: qty 100

## 2020-01-24 MED ORDER — DEXTROSE 5 % IV SOLN
20.0000 mg/m2 | Freq: Once | INTRAVENOUS | Status: AC
  Administered 2020-01-24: 13:00:00 40 mg via INTRAVENOUS
  Filled 2020-01-24: qty 5

## 2020-01-24 MED ORDER — DIPHENHYDRAMINE HCL 25 MG PO CAPS
50.0000 mg | ORAL_CAPSULE | Freq: Once | ORAL | Status: AC
  Administered 2020-01-24: 12:00:00 50 mg via ORAL

## 2020-01-24 MED ORDER — PROCHLORPERAZINE MALEATE 10 MG PO TABS
10.0000 mg | ORAL_TABLET | Freq: Once | ORAL | Status: AC
  Administered 2020-01-24: 12:00:00 10 mg via ORAL

## 2020-01-24 MED ORDER — DARATUMUMAB-HYALURONIDASE-FIHJ 1800-30000 MG-UT/15ML ~~LOC~~ SOLN
1800.0000 mg | Freq: Once | SUBCUTANEOUS | Status: AC
Start: 2020-01-24 — End: 2020-01-24
  Administered 2020-01-24: 13:00:00 1800 mg via SUBCUTANEOUS
  Filled 2020-01-24: qty 15

## 2020-01-24 MED ORDER — MONTELUKAST SODIUM 10 MG PO TABS
10.0000 mg | ORAL_TABLET | Freq: Once | ORAL | Status: AC
  Administered 2020-01-24: 12:00:00 10 mg via ORAL

## 2020-01-24 MED ORDER — DEXAMETHASONE 4 MG PO TABS
20.0000 mg | ORAL_TABLET | Freq: Once | ORAL | Status: AC
  Administered 2020-01-24: 12:00:00 20 mg via ORAL

## 2020-01-24 NOTE — Progress Notes (Signed)
Radiation Oncology         [phone] ________________________________  Name: Mitchell Lowery MRN: [id number]  Date: 01/24/2020  DOB: 07/28/1934  Telephone Follow-Up Visit Note  CC: Mitchell Huddle, MD  No ref. provider found  Diagnosis:   85 yo gentleman with multiple myeloma and a painful right T10 paraspinal lesion    ICD-10-CM   1. Multiple myeloma not having achieved remission (Mitchell Lowery)  C90.00     Interval Since Last Radiation:  8 months   3/29-4/12/21: 1. The L5 lesion was treated to 20 Gy in 10 fractions of 2 Gy 2. The right lateral rib lesion was treated to 20 Gy in 10 fractions of 2 Gy  05/17/2014-06/21/2014:The prostate, seminal vesicles and pelvic lymph nodes were treated to 45 Gy in 25 fractions of 1.8 Gy  07/21/2014:Insertion of radioactive I-125 seeds into the prostate gland;110Gy, boosttherapy.(Atoya Andrew/Dahlststedt)  Narrative:  I was contacted by [name] today and we discussed Mitchell Lowery's recent PET and right chest wall pain.  He has an enlarging PET avid paraspinal/spinal mass at T10.  I called and spoke with Mitchell Lowery while Mitchell Lowery is sleeping.  She confirmed that he is experiencing worsening pain.                              ALLERGIES:  is allergic to bee venom.  Meds: Current Outpatient Medications  Medication Sig Dispense Refill  . amoxicillin (AMOXIL) 500 MG tablet Take 4 tablets (2,000 mg) one hour prior to all dental visits. (Patient not taking: Reported on 01/24/2020) 8 tablet 12  . Cholecalciferol 2000 units Lowery Take 2,000 Units by mouth every evening.     . loperamide (IMODIUM) 2 MG capsule Take 2-4 mg by mouth 4 (four) times daily as needed for diarrhea or loose stools.    Marland Kitchen losartan (COZAAR) 50 MG tablet Take 50 mg by mouth every evening.     . melatonin 5 MG Lowery Take 5 mg by mouth at bedtime as needed.    . metoprolol succinate (TOPROL XL) 25 MG 24 hr tablet Take 0.5 tablets (12.5 mg total) by mouth daily. 45 tablet 3  . psyllium  (METAMUCIL) 28 % packet Take 1 packet by mouth daily after breakfast.    . valACYclovir (VALTREX) 500 MG tablet Take 500 mg by mouth every evening.     . vitamin B-12 (CYANOCOBALAMIN) 500 MCG tablet Take 500 mcg by mouth every evening.     Mitchell Lowery tablet TAKE 1 TABLET BY MOUTH DAILY WITH SUPPER. 30 tablet 11   No current facility-administered medications for this visit.   Facility-Administered Medications Ordered in Other Visits  Medication Dose Route Frequency Provider Last Rate Last Admin  . 0.9 %  sodium chloride infusion   Intravenous Continuous Ladell Pier, MD   Stopped at 01/24/20 1308    Physical Findings: No exam performed since telephone visit.  Lab Findings: Lab Results  Component Value Date   WBC 4.4 01/24/2020   WBC 6.3 11/19/2019   HGB 12.1 (L) 01/24/2020   HGB 11.8 (L) 01/07/2017   HCT 38.6 (L) 01/24/2020   HCT 36.5 (L) 01/07/2017   PLT 72 (L) 01/24/2020   PLT 79 (L) 01/07/2017    Lab Results  Component Value Date   NA 143 01/24/2020   NA 142 01/07/2017   K 4.1 01/24/2020   K 3.9 01/07/2017   CHLORIDE 107 01/07/2017   CO2 27 01/24/2020  CO2 27 01/07/2017   GLUCOSE 90 01/24/2020   GLUCOSE 86 01/07/2017   BUN 15 01/24/2020   BUN 12.3 01/07/2017   CREATININE 0.89 01/24/2020   CREATININE 0.9 01/07/2017   BILITOT 0.6 01/24/2020   BILITOT 0.65 01/07/2017   ALKPHOS 46 01/24/2020   ALKPHOS 40 01/07/2017   AST 14 (L) 01/24/2020   AST 16 01/07/2017   ALT 10 01/24/2020   ALT 13 01/07/2017   PROT 6.0 (L) 01/24/2020   PROT 6.7 01/07/2017   PROT 7.5 01/07/2017   ALBUMIN 3.3 (L) 01/24/2020   ALBUMIN 3.6 01/07/2017   CALCIUM 9.2 01/24/2020   CALCIUM 9.1 01/07/2017   ANIONGAP 7 01/24/2020    Radiographic Findings: NM PET Image Restage (PS) Whole Body  Result Date: 01/24/2020 CLINICAL DATA:  Subsequent treatment strategy for myeloma. Right posterolateral rib pain. EXAM: NUCLEAR MEDICINE PET WHOLE BODY TECHNIQUE: 8.6 mCi F-18 FDG was  injected intravenously. Full-ring PET imaging was performed from the head to foot after the radiotracer. CT data was obtained and used for attenuation correction and anatomic localization. Fasting blood glucose: 98 mg/dl COMPARISON:  Multiple exams, including 04/05/2019 FINDINGS: Mediastinal blood pool activity: SUV max 2.5 HEAD/NECK: No significant abnormal hypermetabolic activity in this region. Incidental CT findings: Bilateral common carotid atherosclerotic calcifications. CHEST: Increase in right lower paraspinal/pleural tumor components, dominant paraspinal mass in the right lower thorax currently 6.1 by 2.3 cm (formerly 4.8 by 1.9 cm) with maximum SUV 5.8 (formerly 3.8). Separate well-defined pleural tumor deposits on the right are likewise increased in size and activity. Moderate right and small left pleural effusion, both increased from prior. On the other hand, there is substantial reduction in the previously bulky right axillary, right internal mammary, and right pericardial space adenopathy. A residual lymph node in the right pericardial adipose tissue measures 0.7 cm in short axis on image 127 of series 4 (formerly 2.1 cm) with maximum SUV 3.7 (formerly 4.1) right supraclavicular lymph node 0.6 cm (formerly 1.0 cm) with maximum SUV 2.2 (formerly 3.7). Incidental CT findings: New moderate pericardial effusion. New aortic valve prosthesis. Mitral valve calcification. Coronary, aortic arch, and branch vessel atherosclerotic vascular disease. ABDOMEN/PELVIS: No significant abnormal hypermetabolic activity in this region. Incidental CT findings: Aortoiliac atherosclerotic vascular disease. Brachytherapy seed implants in the prostate gland. Suspected small posterior prostate diverticulum. Trace free pelvic fluid. Stable trace presacral edema. SKELETON: Substantial improvement in the previously purely lytic lesion of the right lateral ninth rib, currently with some free os occasion of the rib and only a mild  expansile lesion measuring about 0.7 cm in short axis, previously 5.7 cm. This lesion currently has a maximum SUV of 5.4, previously 3.7. As before, the right paraspinal lesion in the lower thorax erodes into the right side of the T10 vertebral body, with slightly more sclerosis than was present previously, but a similar degree of erosion. Increased sclerosis associated with the left L3 transverse process lesion which was previously lytic. Maximum SUV currently 3.1, previously 3.2. The L5 lesion associated with endplate compressions demonstrates increased sclerosis and reduced activity, maximum SUV 2.3, previously 7.0. Incidental CT findings: none EXTREMITIES: New prepatellar bursitis/edema on the right with accentuated metabolic activity in the soft tissues of the region, maximum SUV 3.6, probably benign. Cutaneous activity along the left lateral calf with maximum SUV 4.3, probably benign/inflammatory. Incidental CT findings: none IMPRESSION: 1. Mostly significant improvement especially in the previous bony lesions. These bony lesions are generally substantially reduced in size and currently mostly sclerotic or with small  lytic components rather than the aggressive lytic appearance shown previously. Moreover, adenopathy in the chest is substantially improved. 2. One exception to the general improvement is the right lower thorax paraspinal/pleural mass which has moderately enlarged and has mildly increased SUV compared to previous, as well as the separate well-defined pleural tumor deposits on the right. The moderate right and small left pleural effusion are both increased from prior. 3. New small to moderate pericardial effusion and new aortic valve prosthesis. 4. New prepatellar bursitis/edema on the right associated with accentuated metabolic activity which is likely inflammatory. Similar small area of cutaneous activity along the left lateral calf is probably benign/inflammatory. Electronically Signed   By:  Van Clines M.D.   On: 01/24/2020 09:53   LONG TERM MONITOR (3-14 DAYS)  Result Date: 01/02/2020 1. The basic rhythm is normal sinus with an average HR of 66 bpm 2. Atrial fibrillation is present with an overall AF burden of approximately 1% 3. There are nocturnal pauses of up to 4.2 seconds, one post-termination pause noted 4. There are rare PVC's and few nonsustained ventricular runs, longest is 15 beats with a rate of 104 bpm   Impression:  The patient is experiencing localized pain related to an isolated area of myeloma progression at T10 and may benefit from palliative radiotherapy.  Plan:  Today, I talked to Mitchell Lowery about the findings and work-up thus far.  We discussed possible palliative radiotherapy in the management.  We discussed the available radiation techniques, and focused on the details of logistics and delivery.  We reviewed the anticipated acute and late sequelae associated with radiation in this setting.   The patient would like to proceed with radiation and will be scheduled for CT simulation at 3 pm tomorrow to begin 10 fractions of radiation treatment next Monday 01/30/19 to 20 Gy total.  I spent 20 min in this visit in total.  _____________________________________  Sheral Apley. Tammi Klippel, M.D.

## 2020-01-24 NOTE — Progress Notes (Signed)
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  He was last treated with daratumumab and carfilzomib on 01/24/2020.  He has developed increased pain at the right posterior lateral chest wall for the past several weeks.  The pain is worse at night.  He is taking Tylenol.  This does not completely relieve the pain.  The wound at the left lower leg remains open.  No other complaint.  Objective:  Vital signs in last 24 hours:  Blood pressure 124/70, pulse 78, temperature (!) 97 F (36.1 C), temperature source Tympanic, resp. rate 16, height '5\' 11"'  (1.803 m), weight 182 lb 1.6 oz (82.6 kg), SpO2 99 %.   Resp: Lungs clear bilaterally Cardio: Regular rate and rhythm, 2/6 diastolic murmur GI: No hepatosplenomegaly Vascular: 1+ edema to lower leg bilaterally  Skin: Superficial ulceration at the left lower lateral leg measuring 2-3 cm, no surrounding erythema Musculoskeletal: No tenderness at the spine or chest wall, no palpable mass     Lab Results:  Lab Results  Component Value Date   WBC 4.4 01/24/2020   HGB 12.1 (L) 01/24/2020   HCT 38.6 (L) 01/24/2020   MCV 87.9 01/24/2020   PLT 72 (L) 01/24/2020   NEUTROABS 3.4 01/24/2020    CMP  Lab Results  Component Value Date   NA 143 01/24/2020   K 4.1 01/24/2020   CL 109 01/24/2020   CO2 27 01/24/2020   GLUCOSE 90 01/24/2020   BUN 15 01/24/2020   CREATININE 0.89 01/24/2020   CALCIUM 9.2 01/24/2020   PROT 6.0 (L) 01/24/2020   ALBUMIN 3.3 (L) 01/24/2020   AST 14 (L) 01/24/2020   ALT 10 01/24/2020   ALKPHOS 46 01/24/2020   BILITOT 0.6 01/24/2020   GFRNONAA >60 01/24/2020   GFRAA >60 10/26/2019    No results found for: CEA1  Lab Results  Component Value Date   INR 1.0 10/21/2019    Imaging:  NM PET Image Restage (PS) Whole Body  Result Date: 01/24/2020 CLINICAL DATA:  Subsequent treatment strategy for myeloma. Right  posterolateral rib pain. EXAM: NUCLEAR MEDICINE PET WHOLE BODY TECHNIQUE: 8.6 mCi F-18 FDG was injected intravenously. Full-ring PET imaging was performed from the head to foot after the radiotracer. CT data was obtained and used for attenuation correction and anatomic localization. Fasting blood glucose: 98 mg/dl COMPARISON:  Multiple exams, including 04/05/2019 FINDINGS: Mediastinal blood pool activity: SUV max 2.5 HEAD/NECK: No significant abnormal hypermetabolic activity in this region. Incidental CT findings: Bilateral common carotid atherosclerotic calcifications. CHEST: Increase in right lower paraspinal/pleural tumor components, dominant paraspinal mass in the right lower thorax currently 6.1 by 2.3 cm (formerly 4.8 by 1.9 cm) with maximum SUV 5.8 (formerly 3.8). Separate well-defined pleural tumor deposits on the right are likewise increased in size and activity. Moderate right and small left pleural effusion, both increased from prior. On the other hand, there is substantial reduction in the previously bulky right axillary, right internal mammary, and right pericardial space adenopathy. A residual lymph node in the right pericardial adipose tissue measures 0.7 cm in short axis on image 127 of series 4 (formerly 2.1 cm) with maximum SUV 3.7 (formerly 4.1) right supraclavicular lymph node 0.6 cm (formerly 1.0 cm) with maximum SUV 2.2 (formerly 3.7). Incidental CT findings: New moderate pericardial effusion. New aortic valve prosthesis. Mitral valve calcification. Coronary, aortic arch, and branch vessel atherosclerotic vascular disease. ABDOMEN/PELVIS: No significant abnormal hypermetabolic activity  in this region. Incidental CT findings: Aortoiliac atherosclerotic vascular disease. Brachytherapy seed implants in the prostate gland. Suspected small posterior prostate diverticulum. Trace free pelvic fluid. Stable trace presacral edema. SKELETON: Substantial improvement in the previously purely lytic lesion of  the right lateral ninth rib, currently with some free os occasion of the rib and only a mild expansile lesion measuring about 0.7 cm in short axis, previously 5.7 cm. This lesion currently has a maximum SUV of 5.4, previously 3.7. As before, the right paraspinal lesion in the lower thorax erodes into the right side of the T10 vertebral body, with slightly more sclerosis than was present previously, but a similar degree of erosion. Increased sclerosis associated with the left L3 transverse process lesion which was previously lytic. Maximum SUV currently 3.1, previously 3.2. The L5 lesion associated with endplate compressions demonstrates increased sclerosis and reduced activity, maximum SUV 2.3, previously 7.0. Incidental CT findings: none EXTREMITIES: New prepatellar bursitis/edema on the right with accentuated metabolic activity in the soft tissues of the region, maximum SUV 3.6, probably benign. Cutaneous activity along the left lateral calf with maximum SUV 4.3, probably benign/inflammatory. Incidental CT findings: none IMPRESSION: 1. Mostly significant improvement especially in the previous bony lesions. These bony lesions are generally substantially reduced in size and currently mostly sclerotic or with small lytic components rather than the aggressive lytic appearance shown previously. Moreover, adenopathy in the chest is substantially improved. 2. One exception to the general improvement is the right lower thorax paraspinal/pleural mass which has moderately enlarged and has mildly increased SUV compared to previous, as well as the separate well-defined pleural tumor deposits on the right. The moderate right and small left pleural effusion are both increased from prior. 3. New small to moderate pericardial effusion and new aortic valve prosthesis. 4. New prepatellar bursitis/edema on the right associated with accentuated metabolic activity which is likely inflammatory. Similar small area of cutaneous activity  along the left lateral calf is probably benign/inflammatory. Electronically Signed   By: Van Clines M.D.   On: 01/24/2020 09:53    Medications: I have reviewed the patient's current medications.   Assessment/Plan: 1. Multiple myeloma-confirmed on a bone marrow biopsy 08/07/2014, IgA lambda  Serum M spike and increased serum free lambda light chains  Myeloma FISH panel negative for chromosome 4, 11, 12, 13, 14, and 17 abnormalities, cytogenetics with no metaphases  Bone survey 08/15/2014 with indeterminant lucent skull lesions  Cycle 1 RVD 08/22/2014  Cycle 2 RVD 09/19/2014  Cycle 3 RVD 10/17/2014  Cycle 4 RVD 11/14/2014 (Decadron reduced to 20 mg weekly, Revlimid 15 mg days 1 through 14, Velcade 3/4 weeks)  Cycle 5 RVD 12/12/2014  Serum M spike not detected 12/26/2014  Cycle 6 RVD 01/09/2015  Maintenance Revlimid, 10 mg daily, 02/05/2015 , discontinue 02/26/2015 secondary to leg edema and diarrhea  Revlimid resumed at a dose of 10 mg every other day beginning 03/13/2015  PET scan 35/46/5681-EX hypermetabolic bone lesions, few lucent lesions in the spine and pelvis  Revlimid discontinued January 2019  Enrollment on a clinical trial at [facility] with pomalidomide/Decadron+/- ixazomibbeginning 03/10/2017  Treatment discontinued February 2021 secondary to rising serum lambda light chains  PET 04/05/2019-hypermetabolic right axillary/chest wall nodes, right paravertebral mass, small right pleural effusion, retrocrural node, right lateral abdominal wall mass, L5 lesion mild compression deformity. Focus of hypermetabolism at the anterior left pelvic wall without a CT correlate. Hypermetabolic lytic lesion in the left T3 transverse process  Cycle 1 daratumumab/carfilzomib/Decadron 04/05/2019  Radiation to the right  chest wall mass and lumbar spine beginning 04/25/2019  Cycle 2 daratumumab/carfilzomib/Decadron 05/04/2019 (carfilzomib dose escalated); day 8 held due to  neutropenia, thrombocytopenia; day 15 05/18/2019 (carfilzomib dose reduced)  Daratumumab/carfilzomib/Decadron changed to every 2-week dosing beginning 05/25/2019  Daratumumab changed to a monthly schedule beginning 09/21/2019; carfilzomib every 2 weeks  Daratumumab held 11/15/2019 secondary to thrombocytopenia, carfilzomib continued at a reduced dose  CT chest 11/19/2019-T10 paraspinous mass enlarged compared to 09/13/2019, other bone lesions unchanged  Daratumumab/carfilzomib/Decadron 12/14/2019  Daratumumab/carfilzomib/Decadron 12/27/2019  Daratumumab/carfilzomib/Decadron 01/11/2020  PET 01/24/2020-general improvement in previously noted bone lesions with sclerosis, improvement in chest adenopathy, enlargement of right lower thoracic paraspinal/pleural mass and separate pleural tumor deposits on the right, increased right and left pleural effusions, new small to moderate pericardial effusion, prepatellar bursitis/edema on the right, inflammatory activity at the left lateral calf  Daratumumab/carfilzomib/Decadron 01/24/2020  2. 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 History of intermittent prostatitis  4. Skin rash 10/24/2014-referred to dermatology, biopsy consistent with local hypersensitivity reaction  5. 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 Left lower leg cellulitis 06/24/2015-treated with Keflex  7. History of mild thrombocytopenia secondary tomyeloma and systemic therapy  8. History of mild neutropenia-secondary to myeloma and systemic therapy  9. Fall with a skull fracture and subarachnoid blood/right frontal lobe contusions 10/17/2015  10. Right lower lobe pulmonary embolus 03/28/2016.  Lovenox , transitioned to Xarelto  11. Vesicular rashJune 2018-potentially related to the zoster vaccine, resolved  12. History of atrial fibrillation  13. Aortic stenosis-TAVR 10/25/2019  14. 07/02/2019-hospital admission for sepsis secondary to cellulitis of the left lower extremity with gram-negative rod bacteremia(Aeromonascaviae)  15. Pleuritic chest pain - onset 10/2019, cardiac w/u negative   16. Right shoulder pain, possible bone spur on 04/2019 PET, likely MSK related (12/26/19)  17. Eczematous rash (12/26/19),  not felt to be drug rash/allergy. 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  He otherwise appears stable.  I suspect the pain is related to progression of the thoracic paraspinous/pleural mass and additional pleural deposits in the right chest.  The PET scan reveals no other evidence of disease progression.  The serum light chains were slightly increased on 12/26/2019.  He experienced significant relief of pain when he received radiation to a right chest wall mass and lumbar spine earlier this year.  I will contact Dr. Tammi Klippel and ask him to consider treating the paraspinous and pleural lesions.  I will prescribe Ultracet to use as needed for pain.  We will follow up on the light chains from today.  We will consider changing to a different systemic regimen if the light chains continue to increase.  I will consult with Dr. Amalia Hailey.  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  I will asked Dr. Burt Knack to review the PET scan and get his opinion on the pericardial and pleural effusions.  [name], MD  01/24/2020  10:08 AM

## 2020-01-24 NOTE — Patient Instructions (Addendum)
[facility] Discharge Instructions for Patients Receiving Chemotherapy  Today you received the following chemotherapy agents Carfilzomib (KYPROLIS) & Daratumumab-hyaluronidase-fihj (DARZALEX-FASPRO).  To help prevent nausea and vomiting after your treatment, we encourage you to take your nausea medication as prescribed.   If you develop nausea and vomiting that is not controlled by your nausea medication, call the clinic.   BELOW ARE SYMPTOMS THAT SHOULD BE REPORTED IMMEDIATELY:  *FEVER GREATER THAN 100.5 F  *CHILLS WITH OR WITHOUT FEVER  NAUSEA AND VOMITING THAT IS NOT CONTROLLED WITH YOUR NAUSEA MEDICATION  *UNUSUAL SHORTNESS OF BREATH  *UNUSUAL BRUISING OR BLEEDING  TENDERNESS IN MOUTH AND THROAT WITH OR WITHOUT PRESENCE OF ULCERS  *URINARY PROBLEMS  *BOWEL PROBLEMS  UNUSUAL RASH Items with * indicate a potential emergency and should be followed up as soon as possible.  Feel free to call the clinic should you have any questions or concerns. The clinic phone number is [phone].  Please show the [facility] at check-in to the Emergency Department and triage nurse.  Zoledronic Acid injection (Hypercalcemia, Oncology) What is this medicine? ZOLEDRONIC ACID (ZOE le dron ik AS id) lowers the amount of calcium loss from bone. It is used to treat too much calcium in your blood from cancer. It is also used to prevent complications of cancer that has spread to the bone. This medicine may be used for other purposes; ask your health care provider or pharmacist if you have questions. COMMON BRAND NAME(S): Zometa What should I tell my health care provider before I take this medicine? They need to know if you have any of these conditions:  aspirin-sensitive asthma  cancer, especially if you are receiving medicines used to treat cancer  dental disease or wear dentures  infection  kidney disease  receiving corticosteroids like dexamethasone or  prednisone  an unusual or allergic reaction to zoledronic acid, other medicines, foods, dyes, or preservatives  pregnant or trying to get pregnant  breast-feeding How should I use this medicine? This medicine is for infusion into a vein. It is given by a health care professional in a hospital or clinic setting. Talk to your pediatrician regarding the use of this medicine in children. Special care may be needed. Overdosage: If you think you have taken too much of this medicine contact a poison control center or emergency room at once. NOTE: This medicine is only for you. Do not share this medicine with others. What if I miss a dose? It is important not to miss your dose. Call your doctor or health care professional if you are unable to keep an appointment. What may interact with this medicine?  certain antibiotics given by injection  NSAIDs, medicines for pain and inflammation, like ibuprofen or naproxen  some diuretics like bumetanide, furosemide  teriparatide  thalidomide This list may not describe all possible interactions. Give your health care provider a list of all the medicines, herbs, non-prescription drugs, or dietary supplements you use. Also tell them if you smoke, drink alcohol, or use illegal drugs. Some items may interact with your medicine. What should I watch for while using this medicine? Visit your doctor or health care professional for regular checkups. It may be some time before you see the benefit from this medicine. Do not stop taking your medicine unless your doctor tells you to. Your doctor may order blood tests or other tests to see how you are doing. Women should inform their doctor if they wish to become pregnant or think  they might be pregnant. There is a potential for serious side effects to an unborn child. Talk to your health care professional or pharmacist for more information. You should make sure that you get enough calcium and vitamin D while you are  taking this medicine. Discuss the foods you eat and the vitamins you take with your health care professional. Some people who take this medicine have severe bone, joint, and/or muscle pain. This medicine may also increase your risk for jaw problems or a broken thigh bone. Tell your doctor right away if you have severe pain in your jaw, bones, joints, or muscles. Tell your doctor if you have any pain that does not go away or that gets worse. Tell your dentist and dental surgeon that you are taking this medicine. You should not have major dental surgery while on this medicine. See your dentist to have a dental exam and fix any dental problems before starting this medicine. Take good care of your teeth while on this medicine. Make sure you see your dentist for regular follow-up appointments. What side effects may I notice from receiving this medicine? Side effects that you should report to your doctor or health care professional as soon as possible:  allergic reactions like skin rash, itching or hives, swelling of the face, lips, or tongue  anxiety, confusion, or depression  breathing problems  changes in vision  eye pain  feeling faint or lightheaded, falls  jaw pain, especially after dental work  mouth sores  muscle cramps, stiffness, or weakness  redness, blistering, peeling or loosening of the skin, including inside the mouth  trouble passing urine or change in the amount of urine Side effects that usually do not require medical attention (report to your doctor or health care professional if they continue or are bothersome):  bone, joint, or muscle pain  constipation  diarrhea  fever  hair loss  irritation at site where injected  loss of appetite  nausea, vomiting  stomach upset  trouble sleeping  trouble swallowing  weak or tired This list may not describe all possible side effects. Call your doctor for medical advice about side effects. You may report side  effects to FDA at 1-800-FDA-1088. Where should I keep my medicine? This drug is given in a hospital or clinic and will not be stored at home. NOTE: This sheet is a summary. It may not cover all possible information. If you have questions about this medicine, talk to your doctor, pharmacist, or health care provider.  2020 Elsevier/Gold Standard (2013-06-11 14:19:39)

## 2020-01-24 NOTE — Progress Notes (Signed)
Per Dr. Truett Perna: OK to treat w/platelet count 72,000

## 2020-01-25 ENCOUNTER — Inpatient Hospital Stay: Payer: Medicare PPO

## 2020-01-25 ENCOUNTER — Other Ambulatory Visit: Payer: Self-pay | Admitting: Physician Assistant

## 2020-01-25 ENCOUNTER — Other Ambulatory Visit: Payer: Self-pay

## 2020-01-25 ENCOUNTER — Ambulatory Visit: Admission: RE | Admit: 2020-01-25 | Payer: Medicare PPO | Source: Ambulatory Visit | Admitting: Radiation Oncology

## 2020-01-25 DIAGNOSIS — Z5111 Encounter for antineoplastic chemotherapy: Secondary | ICD-10-CM | POA: Diagnosis not present

## 2020-01-25 DIAGNOSIS — Z79899 Other long term (current) drug therapy: Secondary | ICD-10-CM | POA: Diagnosis not present

## 2020-01-25 DIAGNOSIS — M25511 Pain in right shoulder: Secondary | ICD-10-CM | POA: Diagnosis not present

## 2020-01-25 DIAGNOSIS — I3139 Other pericardial effusion (noninflammatory): Secondary | ICD-10-CM

## 2020-01-25 DIAGNOSIS — Z952 Presence of prosthetic heart valve: Secondary | ICD-10-CM

## 2020-01-25 DIAGNOSIS — I3131 Malignant pericardial effusion in diseases classified elsewhere: Secondary | ICD-10-CM

## 2020-01-25 DIAGNOSIS — C9000 Multiple myeloma not having achieved remission: Secondary | ICD-10-CM | POA: Diagnosis not present

## 2020-01-25 DIAGNOSIS — Z923 Personal history of irradiation: Secondary | ICD-10-CM | POA: Diagnosis not present

## 2020-01-25 DIAGNOSIS — Z8546 Personal history of malignant neoplasm of prostate: Secondary | ICD-10-CM | POA: Diagnosis not present

## 2020-01-25 DIAGNOSIS — L309 Dermatitis, unspecified: Secondary | ICD-10-CM | POA: Diagnosis not present

## 2020-01-25 MED ORDER — FUROSEMIDE 20 MG PO TABS: 20.0000 mg | ORAL_TABLET | Freq: Every day | ORAL | 11 refills | Status: AC

## 2020-01-25 NOTE — Progress Notes (Signed)
We scheduled the patient for CT simulation today for palliation of pain related to a right sided T10 spinal/paraspinal mass from myeloma.  The patient had been experiencing increasing pain over the past 2 weeks at this location correlating with an enlarging mass on recent PET imaging.  Upon arrival to the clinic, the patient reports that his pain has resolved today and wondered whether we should consider radiation now or wait and see.  We talked about the pros and cons of these options and elected to postpone radiation planning today.  I provided the patient with contact information so that we could squeeze him in quickly if his pain returns.

## 2020-01-26 ENCOUNTER — Other Ambulatory Visit: Payer: Self-pay | Admitting: Physician Assistant

## 2020-01-26 ENCOUNTER — Ambulatory Visit (HOSPITAL_COMMUNITY): Payer: Medicare PPO | Attending: Cardiology

## 2020-01-26 DIAGNOSIS — I313 Pericardial effusion (noninflammatory): Secondary | ICD-10-CM | POA: Diagnosis not present

## 2020-01-26 DIAGNOSIS — I3139 Other pericardial effusion (noninflammatory): Secondary | ICD-10-CM

## 2020-01-26 DIAGNOSIS — Z952 Presence of prosthetic heart valve: Secondary | ICD-10-CM

## 2020-01-26 LAB — KAPPA/LAMBDA LIGHT CHAINS
Kappa free light chain: 1.8 mg/L — ABNORMAL LOW (ref 3.3–19.4)
Kappa, lambda light chain ratio: 0.02 — ABNORMAL LOW (ref 0.26–1.65)
Lambda free light chains: 74.2 mg/L — ABNORMAL HIGH (ref 5.7–26.3)

## 2020-01-26 LAB — ECHOCARDIOGRAM LIMITED
AR max vel: 1.99 cm2
AV Area VTI: 2.3 cm2
AV Area mean vel: 2.07 cm2
AV Mean grad: 10 mmHg
AV Peak grad: 19.9 mmHg
Ao pk vel: 2.23 m/s
Area-P 1/2: 4.49 cm2
S' Lateral: 2.9 cm

## 2020-01-26 LAB — PROSTATE-SPECIFIC AG, SERUM (LABCORP): Prostate Specific Ag, Serum: <0.1 ng/mL (ref 0.0–4.0)

## 2020-01-26 LAB — IGA: IgA: 28 mg/dL — ABNORMAL LOW (ref 61–437)

## 2020-01-26 MED FILL — XARELTO 20 MG TABLET: 20 | 30 days supply | Qty: 30 | Fill #10

## 2020-01-31 ENCOUNTER — Telehealth: Payer: Self-pay | Admitting: Cardiovascular Disease

## 2020-01-31 NOTE — Telephone Encounter (Signed)
    Pt returning call from [place], per notes advised that Dr. Excell Seltzer set an ap[pt for him on Monday 01/10 at 3 pm. Pt understood, he said as of today that appt is good. He said they don't have power right now and will check mychart once power turn back on

## 2020-02-01 ENCOUNTER — Ambulatory Visit: Payer: Medicare PPO

## 2020-02-01 ENCOUNTER — Encounter: Payer: Self-pay | Admitting: *Deleted

## 2020-02-02 ENCOUNTER — Ambulatory Visit: Payer: Medicare PPO

## 2020-02-03 ENCOUNTER — Ambulatory Visit: Payer: Medicare PPO

## 2020-02-05 ENCOUNTER — Other Ambulatory Visit: Payer: Self-pay | Admitting: Oncology

## 2020-02-06 ENCOUNTER — Other Ambulatory Visit: Payer: Self-pay

## 2020-02-06 ENCOUNTER — Ambulatory Visit: Payer: Medicare PPO

## 2020-02-06 ENCOUNTER — Encounter: Payer: Self-pay | Admitting: Cardiovascular Disease

## 2020-02-06 ENCOUNTER — Ambulatory Visit (INDEPENDENT_AMBULATORY_CARE_PROVIDER_SITE_OTHER): Payer: Medicare PPO | Admitting: Cardiovascular Disease

## 2020-02-06 VITALS — BP 130/80 | HR 69 | Ht 71.0 in | Wt 178.0 lb

## 2020-02-06 DIAGNOSIS — I2510 Atherosclerotic heart disease of native coronary artery without angina pectoris: Secondary | ICD-10-CM

## 2020-02-06 DIAGNOSIS — Z952 Presence of prosthetic heart valve: Secondary | ICD-10-CM

## 2020-02-06 DIAGNOSIS — I480 Paroxysmal atrial fibrillation: Secondary | ICD-10-CM | POA: Diagnosis not present

## 2020-02-06 DIAGNOSIS — I313 Pericardial effusion (noninflammatory): Secondary | ICD-10-CM

## 2020-02-06 DIAGNOSIS — I3139 Other pericardial effusion (noninflammatory): Secondary | ICD-10-CM
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
## 2020-02-06 NOTE — Patient Instructions (Signed)
Medication Instructions:  Your provider recommends that you continue on your current medications as directed. Please refer to the Current Medication list given to you today.   *If you need a refill on your cardiac medications before your next appointment, please call your pharmacy*  Testing/Procedures: Your physician has requested that you have a limited echocardiogram. Echocardiography is a painless test that uses sound waves to create images of your heart. It provides your doctor with information about the size and shape of your heart and how well your heart's chambers and valves are working. This procedure takes approximately one hour. There are no restrictions for this procedure.  Follow-Up: Your provider recommends that you schedule a follow-up appointment in 4 months with Dr. Burt Knack.

## 2020-02-07 ENCOUNTER — Ambulatory Visit: Payer: Medicare PPO

## 2020-02-08 ENCOUNTER — Ambulatory Visit: Payer: Medicare PPO

## 2020-02-08 ENCOUNTER — Inpatient Hospital Stay: Payer: Medicare PPO

## 2020-02-08 ENCOUNTER — Inpatient Hospital Stay (HOSPITAL_BASED_OUTPATIENT_CLINIC_OR_DEPARTMENT_OTHER): Payer: Medicare PPO | Admitting: Oncology

## 2020-02-08 ENCOUNTER — Encounter: Payer: Self-pay | Admitting: Radiation Oncology

## 2020-02-08 ENCOUNTER — Other Ambulatory Visit: Payer: Medicare PPO

## 2020-02-08 ENCOUNTER — Inpatient Hospital Stay: Payer: Medicare PPO | Attending: Oncology

## 2020-02-08 ENCOUNTER — Ambulatory Visit: Payer: Medicare PPO | Admitting: Nurse Practitioner

## 2020-02-08 ENCOUNTER — Other Ambulatory Visit: Payer: Self-pay

## 2020-02-08 VITALS — BP 133/76 | HR 74 | Temp 97.8°F | Resp 17

## 2020-02-08 VITALS — BP 128/75 | HR 74 | Temp 97.9°F | Resp 18 | Ht 71.0 in | Wt 175.6 lb

## 2020-02-08 DIAGNOSIS — C9000 Multiple myeloma not having achieved remission: Secondary | ICD-10-CM | POA: Diagnosis not present

## 2020-02-08 DIAGNOSIS — Z5111 Encounter for antineoplastic chemotherapy: Secondary | ICD-10-CM | POA: Diagnosis not present

## 2020-02-08 DIAGNOSIS — Z79899 Other long term (current) drug therapy: Secondary | ICD-10-CM | POA: Insufficient documentation

## 2020-02-08 DIAGNOSIS — Z923 Personal history of irradiation: Secondary | ICD-10-CM | POA: Insufficient documentation

## 2020-02-08 LAB — CMP (CANCER CENTER ONLY)
ALT: 9 U/L (ref 0–44)
AST: 14 U/L — ABNORMAL LOW (ref 15–41)
Albumin: 3.4 g/dL — ABNORMAL LOW (ref 3.5–5.0)
Alkaline Phosphatase: 48 U/L (ref 38–126)
Anion gap: 8 (ref 5–15)
BUN: 8 mg/dL (ref 8–23)
CO2: 26 mmol/L (ref 22–32)
Calcium: 9.1 mg/dL (ref 8.9–10.3)
Chloride: 108 mmol/L (ref 98–111)
Creatinine: 0.77 mg/dL (ref 0.61–1.24)
GFR, Estimated: >60 mL/min (ref >60)
Glucose, Bld: 83 mg/dL (ref 70–99)
Potassium: 3.6 mmol/L (ref 3.5–5.1)
Sodium: 142 mmol/L (ref 135–145)
Total Bilirubin: 0.8 mg/dL (ref 0.3–1.2)
Total Protein: 6.1 g/dL — ABNORMAL LOW (ref 6.5–8.1)

## 2020-02-08 LAB — CBC WITH DIFFERENTIAL (CANCER CENTER ONLY)
Abs Immature Granulocytes: 0.01 10*3/uL (ref 0.00–0.07)
Basophils Absolute: 0 10*3/uL (ref 0.0–0.1)
Basophils Relative: 1 %
Eosinophils Absolute: 0 10*3/uL (ref 0.0–0.5)
Eosinophils Relative: 1 %
HCT: 40.1 % (ref 39.0–52.0)
Hemoglobin: 12.2 g/dL — ABNORMAL LOW (ref 13.0–17.0)
Immature Granulocytes: 0 %
Lymphocytes Relative: 15 %
Lymphs Abs: 0.5 10*3/uL — ABNORMAL LOW (ref 0.7–4.0)
MCH: 27.4 pg (ref 26.0–34.0)
MCHC: 30.4 g/dL (ref 30.0–36.0)
MCV: 89.9 fL (ref 80.0–100.0)
Monocytes Absolute: 0.4 10*3/uL (ref 0.1–1.0)
Monocytes Relative: 11 %
Neutro Abs: 2.7 10*3/uL (ref 1.7–7.7)
Neutrophils Relative %: 72 %
Platelet Count: 83 10*3/uL — ABNORMAL LOW (ref 150–400)
RBC: 4.46 MIL/uL (ref 4.22–5.81)
RDW: 16.3 % — ABNORMAL HIGH (ref 11.5–15.5)
WBC Count: 3.7 10*3/uL — ABNORMAL LOW (ref 4.0–10.5)
nRBC: 0 % (ref 0.0–0.2)

## 2020-02-08 MED ORDER — ACETAMINOPHEN 325 MG PO TABS
650.0000 mg | ORAL_TABLET | Freq: Once | ORAL | Status: AC
  Administered 2020-02-08: 650 mg via ORAL

## 2020-02-08 MED ORDER — HYDROCODONE-ACETAMINOPHEN 5-325 MG PO TABS: 1.0000 | ORAL_TABLET | Freq: Four times a day (QID) | ORAL | 0 refills | Status: DC | PRN

## 2020-02-08 MED ORDER — MONTELUKAST SODIUM 10 MG PO TABS
ORAL_TABLET | ORAL | Status: AC
  Filled 2020-02-08: qty 1

## 2020-02-08 MED ORDER — MONTELUKAST SODIUM 10 MG PO TABS
10.0000 mg | ORAL_TABLET | Freq: Once | ORAL | Status: AC
  Administered 2020-02-08: 10 mg via ORAL

## 2020-02-08 MED ORDER — ACETAMINOPHEN 325 MG PO TABS
ORAL_TABLET | ORAL | Status: AC
  Filled 2020-02-08: qty 2

## 2020-02-08 MED ORDER — DIPHENHYDRAMINE HCL 25 MG PO CAPS
50.0000 mg | ORAL_CAPSULE | Freq: Once | ORAL | Status: AC
Start: 2020-02-08 — End: 2020-02-08
  Administered 2020-02-08: 50 mg via ORAL

## 2020-02-08 MED ORDER — DIPHENHYDRAMINE HCL 25 MG PO CAPS
ORAL_CAPSULE | ORAL | Status: AC
  Filled 2020-02-08: qty 2

## 2020-02-08 MED ORDER — DEXAMETHASONE 4 MG PO TABS
20.0000 mg | ORAL_TABLET | Freq: Once | ORAL | Status: AC
  Administered 2020-02-08: 20 mg via ORAL

## 2020-02-08 MED ORDER — PROCHLORPERAZINE MALEATE 10 MG PO TABS
10.0000 mg | ORAL_TABLET | Freq: Once | ORAL | Status: AC
  Administered 2020-02-08: 10 mg via ORAL

## 2020-02-08 MED ORDER — DARATUMUMAB-HYALURONIDASE-FIHJ 1800-30000 MG-UT/15ML ~~LOC~~ SOLN
1800.0000 mg | Freq: Once | SUBCUTANEOUS | Status: AC
  Administered 2020-02-08: 1800 mg via SUBCUTANEOUS
  Filled 2020-02-08: qty 15

## 2020-02-08 MED ORDER — SODIUM CHLORIDE 0.9 % IV SOLN
INTRAVENOUS | Status: DC
  Filled 2020-02-08: qty 250

## 2020-02-08 MED ORDER — PROCHLORPERAZINE MALEATE 10 MG PO TABS
ORAL_TABLET | ORAL | Status: AC
  Filled 2020-02-08: qty 1

## 2020-02-08 MED ORDER — DEXAMETHASONE 4 MG PO TABS
ORAL_TABLET | ORAL | Status: AC
  Filled 2020-02-08: qty 5

## 2020-02-08 MED ORDER — DEXTROSE 5 % IV SOLN
20.0000 mg/m2 | Freq: Once | INTRAVENOUS | Status: AC
  Administered 2020-02-08: 40 mg via INTRAVENOUS
  Filled 2020-02-08: qty 15

## 2020-02-08 NOTE — Progress Notes (Signed)
Per [name]: OK to treat w/platelets 83,000
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
## 2020-02-08 NOTE — Progress Notes (Signed)
Got messaged by [name] that patient's right back pain has worsened, and he would like to proceed with the treatment to that area.  I added him to the simulation schedule on Thursday 1/13 at 9 am

## 2020-02-08 NOTE — Patient Instructions (Addendum)
[facility] Discharge Instructions for Patients Receiving Chemotherapy  Today you received the following chemotherapy agents Carfilzomib (KYPROLIS) & Daratumumab-hyaluronidase-fihj (DARZALEX FASPRO).  To help prevent nausea and vomiting after your treatment, we encourage you to take your nausea medication as prescribed.   If you develop nausea and vomiting that is not controlled by your nausea medication, call the clinic.   BELOW ARE SYMPTOMS THAT SHOULD BE REPORTED IMMEDIATELY:  *FEVER GREATER THAN 100.5 F  *CHILLS WITH OR WITHOUT FEVER  NAUSEA AND VOMITING THAT IS NOT CONTROLLED WITH YOUR NAUSEA MEDICATION  *UNUSUAL SHORTNESS OF BREATH  *UNUSUAL BRUISING OR BLEEDING  TENDERNESS IN MOUTH AND THROAT WITH OR WITHOUT PRESENCE OF ULCERS  *URINARY PROBLEMS  *BOWEL PROBLEMS  UNUSUAL RASH Items with * indicate a potential emergency and should be followed up as soon as possible.  Feel free to call the clinic should you have any questions or concerns. The clinic phone number is [phone].  Please show the CHEMO ALERT CARD at check-in to the Emergency Department and triage nurse.   

## 2020-02-09 ENCOUNTER — Ambulatory Visit
Admission: RE | Admit: 2020-02-09 | Discharge: 2020-02-09 | Disposition: A | Discharge status: Home / Self Care | Payer: Medicare PPO | Source: Ambulatory Visit | Attending: Radiation Oncology | Admitting: Radiation Oncology

## 2020-02-09 ENCOUNTER — Ambulatory Visit: Payer: Medicare PPO

## 2020-02-09 ENCOUNTER — Other Ambulatory Visit: Payer: Self-pay

## 2020-02-09 DIAGNOSIS — C9000 Multiple myeloma not having achieved remission: Secondary | ICD-10-CM | POA: Diagnosis not present

## 2020-02-09 DIAGNOSIS — G893 Neoplasm related pain (acute) (chronic): Secondary | ICD-10-CM | POA: Diagnosis not present

## 2020-02-09 DIAGNOSIS — Z510 Encounter for antineoplastic radiation therapy: Secondary | ICD-10-CM | POA: Diagnosis present

## 2020-02-09 DIAGNOSIS — C7949 Secondary malignant neoplasm of other parts of nervous system: Secondary | ICD-10-CM | POA: Diagnosis not present

## 2020-02-09 DIAGNOSIS — C9002 Multiple myeloma in relapse: Secondary | ICD-10-CM | POA: Diagnosis not present

## 2020-02-09 LAB — KAPPA/LAMBDA LIGHT CHAINS
Kappa free light chain: 3.2 mg/L — ABNORMAL LOW (ref 3.3–19.4)
Kappa, lambda light chain ratio: 0.04 — ABNORMAL LOW (ref 0.26–1.65)
Lambda free light chains: 80.4 mg/L — ABNORMAL HIGH (ref 5.7–26.3)

## 2020-02-09 LAB — IGA: IgA: 32 mg/dL — ABNORMAL LOW (ref 61–437)

## 2020-02-09 NOTE — Progress Notes (Signed)
  Radiation Oncology         [phone] ________________________________  Name: Mitchell Lowery MRN: [id number]  Date: 02/09/2020  DOB: 01/22/1935  SIMULATION AND TREATMENT PLANNING NOTE    ICD-10-CM   1. Multiple myeloma not having achieved remission ([place])  C90.00     DIAGNOSIS:  86 yo man with a painful myeloma lesion at T10 and in the right paraspinal space.  NARRATIVE:  The patient was brought to the [facility].  Identity was confirmed.  All relevant records and images related to the planned course of therapy were reviewed.  The patient freely provided informed written consent to proceed with treatment after reviewing the details related to the planned course of therapy. The consent form was witnessed and verified by the simulation staff.  Then, the patient was set-up in a stable reproducible  supine position for radiation therapy.  CT images were obtained.  Surface markings were placed.  The CT images were loaded into the planning software.  Then the target and avoidance structures were contoured.  Treatment planning then occurred.  The radiation prescription was entered and confirmed.  Then, I designed and supervised the construction of a total of 3 medically necessary complex treatment devices including 3 MLCs to shield the heart and lungs  I have requested : 3D Simulation  I have requested a DVH of the following structures: left lung, right lung, esophagus and spinal cord  PLAN:  The patient will receive 20 Gy in 10 fractions.  ________________________________  Sheral Apley Tammi Klippel, M.D.

## 2020-02-10 ENCOUNTER — Ambulatory Visit: Payer: Medicare PPO

## 2020-02-10 ENCOUNTER — Telehealth: Payer: Self-pay | Admitting: Oncology

## 2020-02-10 ENCOUNTER — Other Ambulatory Visit: Payer: Self-pay | Admitting: Oncology

## 2020-02-10 DIAGNOSIS — C9002 Multiple myeloma in relapse: Secondary | ICD-10-CM | POA: Diagnosis not present

## 2020-02-10 DIAGNOSIS — G893 Neoplasm related pain (acute) (chronic): Secondary | ICD-10-CM | POA: Diagnosis not present

## 2020-02-10 DIAGNOSIS — C9000 Multiple myeloma not having achieved remission: Secondary | ICD-10-CM | POA: Diagnosis not present

## 2020-02-10 DIAGNOSIS — C7949 Secondary malignant neoplasm of other parts of nervous system: Secondary | ICD-10-CM | POA: Diagnosis not present

## 2020-02-10 NOTE — Telephone Encounter (Signed)
Scheduled appointments per 1/12 los. Called patient, no answer. Left message with appointments date and times.

## 2020-02-13 ENCOUNTER — Ambulatory Visit: Payer: Medicare PPO

## 2020-02-13 ENCOUNTER — Encounter (HOSPITAL_COMMUNITY): Payer: Self-pay | Admitting: Radiology

## 2020-02-13 NOTE — Progress Notes (Signed)
Mitchell Lowery "Mitchell Lowery" Male, 85 y.o., 08/25/1934  MRN:  [id number] Phone:  [phone] (M)       PCP:  Gates, Robert, MD Coverage:  [employer] Medicare/[employer] Medicare Choice Ppo  Next Appt With Radiology ([facility]-CT 3) 02/20/2020 at 8:00 AM           RE: CT Biopsy Received: 3 days ago Sherrill, Mitchell Lowery B, MD  Simpson, Nitasha D  Yes okay to hold Xarelto prior to biopsy        Previous Messages   ----- Message -----  From: Simpson, Nitasha D  Sent: 02/10/2020  5:08 PM EST  To: Mitchell Lowery B Sherrill, MD  Subject: FW: CT Biopsy                   Good afternoon, patient is Xarelto and will need to hold for 1 day prior to biopsy. Please advise if okay to hold. Thanks Tasha  ----- Message -----  From: Simpson, Nitasha D  Sent: 02/10/2020  5:06 PM EST  To: [facility] Procedure Requests  Subject: CT Biopsy                     Procedure: CT Biopsy   Reason: Multiple myeloma not having achieved remission, myeloma, please biopsy right chest pleural or paraspinous mass   History:  CT, NM in computer   Provider: SHERRILL, Mitchell Lowery B   Provider Contact:  [phone]     

## 2020-02-13 NOTE — Progress Notes (Signed)
Mitchell Lowery. [address]Domin[address] Lowery" Male, 86 y.o., 03-Jun-1934  MRN:  [id number] Phone:  [phone] Mitchell Lowery)       PCP:  [name], MD Coverage:  [facility] Medicare/[employer] Medicare Choice Ppo  Next Appt With Radiology (MC-CT 3) 02/20/2020 at 8:00 AM           RE: CT Biopsy Received: 2 days ago Mitchell Daft, MD  Mitchell Lowery for CT guided biopsy of right paraspinal / pleural lesion.   [place]        Previous Messages   ----- Message -----  From: Mitchell Lowery D  Sent: 02/10/2020  5:06 PM EST  To: Ir Procedure Requests  Subject: CT Biopsy                     Procedure: CT Biopsy   Reason: Multiple myeloma not having achieved remission, myeloma, please biopsy right chest pleural or paraspinous mass   History:  CT, NM in computer   Provider: Ladell Pier   Provider Contact:  [phone]

## 2020-02-14 ENCOUNTER — Ambulatory Visit: Payer: Medicare PPO

## 2020-02-15 ENCOUNTER — Ambulatory Visit
Admission: RE | Admit: 2020-02-15 | Discharge: 2020-02-15 | Disposition: A | Discharge status: Home / Self Care | Payer: Medicare PPO | Source: Ambulatory Visit | Attending: Radiation Oncology | Admitting: Radiation Oncology

## 2020-02-15 ENCOUNTER — Other Ambulatory Visit: Payer: Self-pay

## 2020-02-15 DIAGNOSIS — I4891 Unspecified atrial fibrillation: Secondary | ICD-10-CM | POA: Diagnosis not present

## 2020-02-15 DIAGNOSIS — I10 Essential (primary) hypertension: Secondary | ICD-10-CM | POA: Diagnosis not present

## 2020-02-15 DIAGNOSIS — C7949 Secondary malignant neoplasm of other parts of nervous system: Secondary | ICD-10-CM | POA: Diagnosis not present

## 2020-02-15 DIAGNOSIS — N400 Enlarged prostate without lower urinary tract symptoms: Secondary | ICD-10-CM | POA: Diagnosis not present

## 2020-02-15 DIAGNOSIS — D61818 Other pancytopenia: Secondary | ICD-10-CM | POA: Diagnosis not present

## 2020-02-15 DIAGNOSIS — D649 Anemia, unspecified: Secondary | ICD-10-CM | POA: Diagnosis not present

## 2020-02-15 DIAGNOSIS — C9002 Multiple myeloma in relapse: Secondary | ICD-10-CM | POA: Diagnosis not present

## 2020-02-15 DIAGNOSIS — C61 Malignant neoplasm of prostate: Secondary | ICD-10-CM | POA: Diagnosis not present

## 2020-02-15 DIAGNOSIS — C9000 Multiple myeloma not having achieved remission: Secondary | ICD-10-CM | POA: Diagnosis not present

## 2020-02-15 DIAGNOSIS — G893 Neoplasm related pain (acute) (chronic): Secondary | ICD-10-CM | POA: Diagnosis not present

## 2020-02-16 ENCOUNTER — Ambulatory Visit
Admission: RE | Admit: 2020-02-16 | Discharge: 2020-02-16 | Disposition: A | Discharge status: Home / Self Care | Payer: Medicare PPO | Source: Ambulatory Visit | Attending: Radiation Oncology | Admitting: Radiation Oncology

## 2020-02-16 DIAGNOSIS — G893 Neoplasm related pain (acute) (chronic): Secondary | ICD-10-CM | POA: Diagnosis not present

## 2020-02-16 DIAGNOSIS — C7949 Secondary malignant neoplasm of other parts of nervous system: Secondary | ICD-10-CM | POA: Diagnosis not present

## 2020-02-16 DIAGNOSIS — C9002 Multiple myeloma in relapse: Secondary | ICD-10-CM | POA: Diagnosis not present

## 2020-02-16 DIAGNOSIS — C9000 Multiple myeloma not having achieved remission: Secondary | ICD-10-CM | POA: Diagnosis not present

## 2020-02-17 ENCOUNTER — Ambulatory Visit
Admission: RE | Admit: 2020-02-17 | Discharge: 2020-02-17 | Disposition: A | Discharge status: Home / Self Care | Payer: Medicare PPO | Source: Ambulatory Visit | Attending: Radiation Oncology | Admitting: Radiation Oncology

## 2020-02-17 ENCOUNTER — Other Ambulatory Visit: Payer: Self-pay | Admitting: Radiology

## 2020-02-17 ENCOUNTER — Other Ambulatory Visit (HOSPITAL_COMMUNITY)
Admission: RE | Admit: 2020-02-17 | Discharge: 2020-02-17 | Disposition: A | Discharge status: Home / Self Care | Payer: Medicare PPO | Source: Ambulatory Visit | Attending: Oncology | Admitting: Oncology

## 2020-02-17 ENCOUNTER — Other Ambulatory Visit: Payer: Self-pay | Admitting: Student

## 2020-02-17 ENCOUNTER — Other Ambulatory Visit: Payer: Self-pay

## 2020-02-17 DIAGNOSIS — Z01812 Encounter for preprocedural laboratory examination: Secondary | ICD-10-CM | POA: Diagnosis not present

## 2020-02-17 DIAGNOSIS — G893 Neoplasm related pain (acute) (chronic): Secondary | ICD-10-CM | POA: Diagnosis not present

## 2020-02-17 DIAGNOSIS — C9000 Multiple myeloma not having achieved remission: Secondary | ICD-10-CM | POA: Diagnosis not present

## 2020-02-17 DIAGNOSIS — Z20822 Contact with and (suspected) exposure to covid-19: Secondary | ICD-10-CM | POA: Insufficient documentation

## 2020-02-17 DIAGNOSIS — C9002 Multiple myeloma in relapse: Secondary | ICD-10-CM | POA: Diagnosis not present

## 2020-02-17 DIAGNOSIS — C7949 Secondary malignant neoplasm of other parts of nervous system: Secondary | ICD-10-CM | POA: Diagnosis not present

## 2020-02-17 LAB — SARS CORONAVIRUS 2 (TAT 6-24 HRS): SARS Coronavirus 2: NEGATIVE
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
## 2020-02-19 ENCOUNTER — Other Ambulatory Visit: Payer: Self-pay | Admitting: Oncology

## 2020-02-20 ENCOUNTER — Ambulatory Visit
Admission: RE | Admit: 2020-02-20 | Discharge: 2020-02-20 | Disposition: A | Discharge status: Home / Self Care | Payer: Medicare PPO | Source: Ambulatory Visit | Attending: Radiation Oncology | Admitting: Radiation Oncology

## 2020-02-20 ENCOUNTER — Other Ambulatory Visit: Payer: Self-pay

## 2020-02-20 ENCOUNTER — Encounter (HOSPITAL_COMMUNITY): Payer: Self-pay

## 2020-02-20 ENCOUNTER — Ambulatory Visit (HOSPITAL_COMMUNITY)
Admission: RE | Admit: 2020-02-20 | Discharge: 2020-02-20 | Disposition: A | Discharge status: Home / Self Care | Payer: Medicare PPO | Source: Ambulatory Visit | Attending: Oncology | Admitting: Oncology

## 2020-02-20 DIAGNOSIS — M799 Soft tissue disorder, unspecified: Secondary | ICD-10-CM | POA: Diagnosis not present

## 2020-02-20 DIAGNOSIS — C7949 Secondary malignant neoplasm of other parts of nervous system: Secondary | ICD-10-CM | POA: Diagnosis not present

## 2020-02-20 DIAGNOSIS — C9000 Multiple myeloma not having achieved remission: Secondary | ICD-10-CM | POA: Diagnosis not present

## 2020-02-20 DIAGNOSIS — G893 Neoplasm related pain (acute) (chronic): Secondary | ICD-10-CM | POA: Diagnosis not present

## 2020-02-20 DIAGNOSIS — R222 Localized swelling, mass and lump, trunk: Secondary | ICD-10-CM | POA: Diagnosis not present

## 2020-02-20 DIAGNOSIS — C9002 Multiple myeloma in relapse: Secondary | ICD-10-CM | POA: Diagnosis not present

## 2020-02-20 DIAGNOSIS — D492 Neoplasm of unspecified behavior of bone, soft tissue, and skin: Secondary | ICD-10-CM | POA: Diagnosis not present

## 2020-02-20 LAB — CBC
HCT: 40.9 % (ref 39.0–52.0)
Hemoglobin: 12.6 g/dL — ABNORMAL LOW (ref 13.0–17.0)
MCH: 27.3 pg (ref 26.0–34.0)
MCHC: 30.8 g/dL (ref 30.0–36.0)
MCV: 88.5 fL (ref 80.0–100.0)
Platelets: 76 10*3/uL — ABNORMAL LOW (ref 150–400)
RBC: 4.62 MIL/uL (ref 4.22–5.81)
RDW: 17.1 % — ABNORMAL HIGH (ref 11.5–15.5)
WBC: 3.7 10*3/uL — ABNORMAL LOW (ref 4.0–10.5)
nRBC: 0 % (ref 0.0–0.2)

## 2020-02-20 LAB — PROTIME-INR
INR: 1 (ref 0.8–1.2)
Prothrombin Time: 13.1 seconds (ref 11.4–15.2)

## 2020-02-20 MED ORDER — LIDOCAINE HCL 1 % IJ SOLN
INTRAMUSCULAR | Status: AC
  Filled 2020-02-20: qty 20

## 2020-02-20 MED ORDER — SODIUM CHLORIDE 0.9 % IV SOLN
INTRAVENOUS | Status: AC | PRN
  Administered 2020-02-20: 10 mL/h via INTRAVENOUS

## 2020-02-20 MED ORDER — FENTANYL CITRATE (PF) 100 MCG/2ML IJ SOLN
INTRAMUSCULAR | Status: AC
  Filled 2020-02-20: qty 2

## 2020-02-20 MED ORDER — SODIUM CHLORIDE 0.9 % IV SOLN: INTRAVENOUS | Status: DC

## 2020-02-20 MED ORDER — MIDAZOLAM HCL 2 MG/2ML IJ SOLN
INTRAMUSCULAR | Status: AC
  Filled 2020-02-20: qty 2

## 2020-02-20 MED ORDER — FENTANYL CITRATE (PF) 100 MCG/2ML IJ SOLN
INTRAMUSCULAR | Status: AC | PRN
Start: 2020-02-20 — End: 2020-02-20
  Administered 2020-02-20 (×2): 25 ug via INTRAVENOUS

## 2020-02-20 MED ORDER — MIDAZOLAM HCL 2 MG/2ML IJ SOLN
INTRAMUSCULAR | Status: AC | PRN
  Administered 2020-02-20: 1 mg via INTRAVENOUS
  Administered 2020-02-20: 0.5 mg via INTRAVENOUS

## 2020-02-20 NOTE — Discharge Instructions (Addendum)
Needle Biopsy, Care After This sheet gives you information about how to care for yourself after your procedure. Your health care provider may also give you more specific instructions. If you have problems or questions, contact your health care provider. What can I expect after the procedure? After the procedure, it is common to have soreness, bruising, or mild pain at the puncture site. This should go away in a few days. Follow these instructions at home: Needle insertion site care  Wash your hands with soap and water before you change your bandage (dressing). If you cannot use soap and water, use hand sanitizer.  Follow instructions from your health care provider about how to take care of your puncture site. This includes: ? When and how to change your dressing. ? When to remove your dressing.  Check your puncture site every day for signs of infection. Check for: ? Redness, swelling, or pain. ? Fluid or blood. ? Pus or a bad smell. ? Warmth.   General instructions  Return to your normal activities as told by your health care provider. Ask your health care provider what activities are safe for you.  Do not take baths, swim, or use a hot tub until your health care provider approves. Ask your health care provider if you may take showers. You may only be allowed to take sponge baths.  Take over-the-counter and prescription medicines only as told by your health care provider.  Keep all follow-up visits as told by your health care provider. This is important. Contact a health care provider if:  You have a fever.  You have redness, swelling, or pain at the puncture site that lasts longer than a few days.  You have fluid, blood, or pus coming from your puncture site.  Your puncture site feels warm to the touch. Get help right away if:  You have severe bleeding from the puncture site. Summary  After the procedure, it is common to have soreness, bruising, or mild pain at the puncture  site. This should go away in a few days.  Check your puncture site every day for signs of infection, such as redness, swelling, or pain.  Get help right away if you have severe bleeding from your puncture site. This information is not intended to replace advice given to you by your health care provider. Make sure you discuss any questions you have with your health care provider. Document Revised: 07/14/2019 Document Reviewed: 07/14/2019 Elsevier Patient Education  2021 [employer]. Moderate Conscious Sedation, Adult Sedation is the use of medicines to promote relaxation and to relieve discomfort and anxiety. Moderate conscious sedation is a type of sedation. Under moderate conscious sedation, you are less alert than normal, but you are still able to respond to instructions, touch, or both. Moderate conscious sedation is used during short medical and dental procedures. It is milder than deep sedation, which is a type of sedation under which you cannot be easily woken up. It is also milder than general anesthesia, which is the use of medicines to make you unconscious. Moderate conscious sedation allows you to return to your regular activities sooner. Tell a health care provider about:  Any allergies you have.  All medicines you are taking, including vitamins, herbs, eye drops, creams, and over-the-counter medicines.  Any use of steroids. This includes steroids taken by mouth or as a cream.  Any problems you or family members have had with sedatives and anesthetic medicines.  Any blood disorders you have.  Any surgeries you  have had.  Any medical conditions you have, such as sleep apnea.  Whether you are pregnant or may be pregnant.  Any use of cigarettes, alcohol, marijuana, or drugs. What are the risks? Generally, this is a safe procedure. However, problems may occur, including:  Getting too much medicine (oversedation).  Nausea.  Allergic reaction to medicines.  Trouble  breathing. If this happens, a breathing tube may be used. It will be removed when you are awake and breathing on your own.  Heart trouble.  Lung trouble.  Confusion that gets better with time (emergence delirium). What happens before the procedure? Staying hydrated Follow instructions from your health care provider about hydration, which may include:  Up to 2 hours before the procedure - you may continue to drink clear liquids, such as water, clear fruit juice, black coffee, and plain tea. Eating and drinking restrictions Follow instructions from your health care provider about eating and drinking, which may include:  8 hours before the procedure - stop eating heavy meals or foods, such as meat, fried foods, or fatty foods.  6 hours before the procedure - stop eating light meals or foods, such as toast or cereal.  6 hours before the procedure - stop drinking milk or drinks that contain milk.  2 hours before the procedure - stop drinking clear liquids. Medicines Ask your health care provider about:  Changing or stopping your regular medicines. This is especially important if you are taking diabetes medicines or blood thinners.  Taking medicines such as aspirin and ibuprofen. These medicines can thin your blood. Do not take these medicines unless your health care provider tells you to take them.  Taking over-the-counter medicines, vitamins, herbs, and supplements. Tests and exams  You will have a physical exam.  You may have blood tests done to show how well: ? Your kidneys and liver work. ? Your blood clots. General instructions  Plan to have a responsible adult take you home from the hospital or clinic.  If you will be going home right after the procedure, plan to have a responsible adult care for you for the time you are told. This is important. What happens during the procedure?  You will be given the sedative. The sedative may be given: ? As a pill that you will  swallow. It can also be inserted into the rectum. ? As a spray through the nose. ? As an injection into the muscle. ? As an injection into the vein through an IV.  You may be given oxygen as needed.  Your breathing, heart rate, and blood pressure will be monitored during the procedure.  The medical or dental procedure will be done. The procedure may vary among health care providers and hospitals.   What happens after the procedure?  Your blood pressure, heart rate, breathing rate, and blood oxygen level will be monitored until you leave the hospital or clinic.  You will get fluids through your IV if needed.  Do not drive or operate machinery until your health care provider says that it is safe. Summary  Sedation is the use of medicines to promote relaxation and to relieve discomfort and anxiety. Moderate conscious sedation is a type of sedation that is used during short medical and dental procedures.  Tell the health care provider about any medical conditions that you have and about all the medicines that you are taking.  You will be given the sedative as a pill, a spray through the nose, an injection into the muscle,  or an injection into the vein through an IV. Vital signs are monitored during the sedation.  Moderate conscious sedation allows you to return to your regular activities sooner. This information is not intended to replace advice given to you by your health care provider. Make sure you discuss any questions you have with your health care provider. Document Revised: 05/13/2019 Document Reviewed: 12/09/2018 Elsevier Patient Education  2021 [employer].

## 2020-02-20 NOTE — Procedures (Signed)
Interventional Radiology Procedure Note  Procedure: US guided biopsy of right paraspinal mass, mx 29V core Complications: None EBL: None Recommendations: - Bedrest 1 hours.   - Routine wound care - Follow up pathology - Advance diet   Signed,  Corrie Mckusick, DO

## 2020-02-21 ENCOUNTER — Ambulatory Visit
Admission: RE | Admit: 2020-02-21 | Discharge: 2020-02-21 | Disposition: A | Discharge status: Home / Self Care | Payer: Medicare PPO | Source: Ambulatory Visit | Attending: Radiation Oncology | Admitting: Radiation Oncology

## 2020-02-21 ENCOUNTER — Other Ambulatory Visit: Payer: Self-pay

## 2020-02-21 DIAGNOSIS — C9000 Multiple myeloma not having achieved remission: Secondary | ICD-10-CM | POA: Diagnosis not present

## 2020-02-21 DIAGNOSIS — C9002 Multiple myeloma in relapse: Secondary | ICD-10-CM | POA: Diagnosis not present

## 2020-02-21 DIAGNOSIS — C7949 Secondary malignant neoplasm of other parts of nervous system: Secondary | ICD-10-CM | POA: Diagnosis not present

## 2020-02-21 DIAGNOSIS — G893 Neoplasm related pain (acute) (chronic): Secondary | ICD-10-CM | POA: Diagnosis not present

## 2020-02-22 ENCOUNTER — Other Ambulatory Visit: Payer: Self-pay

## 2020-02-22 ENCOUNTER — Ambulatory Visit
Admission: RE | Admit: 2020-02-22 | Discharge: 2020-02-22 | Disposition: A | Discharge status: Home / Self Care | Payer: Medicare PPO | Source: Ambulatory Visit | Attending: Radiation Oncology | Admitting: Radiation Oncology

## 2020-02-22 DIAGNOSIS — C9002 Multiple myeloma in relapse: Secondary | ICD-10-CM | POA: Diagnosis not present

## 2020-02-22 DIAGNOSIS — C9000 Multiple myeloma not having achieved remission: Secondary | ICD-10-CM | POA: Diagnosis not present

## 2020-02-22 DIAGNOSIS — G893 Neoplasm related pain (acute) (chronic): Secondary | ICD-10-CM | POA: Diagnosis not present

## 2020-02-22 DIAGNOSIS — C7949 Secondary malignant neoplasm of other parts of nervous system: Secondary | ICD-10-CM | POA: Diagnosis not present

## 2020-02-22 LAB — SURGICAL PATHOLOGY

## 2020-02-23 ENCOUNTER — Ambulatory Visit
Admission: RE | Admit: 2020-02-23 | Discharge: 2020-02-23 | Disposition: A | Discharge status: Home / Self Care | Payer: Medicare PPO | Source: Ambulatory Visit | Attending: Radiation Oncology | Admitting: Radiation Oncology

## 2020-02-23 ENCOUNTER — Other Ambulatory Visit: Payer: Self-pay

## 2020-02-23 DIAGNOSIS — G893 Neoplasm related pain (acute) (chronic): Secondary | ICD-10-CM | POA: Diagnosis not present

## 2020-02-23 DIAGNOSIS — C9000 Multiple myeloma not having achieved remission: Secondary | ICD-10-CM | POA: Diagnosis not present

## 2020-02-23 DIAGNOSIS — C7949 Secondary malignant neoplasm of other parts of nervous system: Secondary | ICD-10-CM | POA: Diagnosis not present

## 2020-02-23 DIAGNOSIS — C9002 Multiple myeloma in relapse: Secondary | ICD-10-CM | POA: Diagnosis not present

## 2020-02-24 ENCOUNTER — Other Ambulatory Visit: Payer: Self-pay

## 2020-02-24 ENCOUNTER — Ambulatory Visit
Admission: RE | Admit: 2020-02-24 | Discharge: 2020-02-24 | Disposition: A | Discharge status: Home / Self Care | Payer: Medicare PPO | Source: Ambulatory Visit | Attending: Radiation Oncology | Admitting: Radiation Oncology

## 2020-02-24 ENCOUNTER — Ambulatory Visit: Payer: Medicare PPO

## 2020-02-24 DIAGNOSIS — G893 Neoplasm related pain (acute) (chronic): Secondary | ICD-10-CM | POA: Diagnosis not present

## 2020-02-24 DIAGNOSIS — C9002 Multiple myeloma in relapse: Secondary | ICD-10-CM | POA: Diagnosis not present

## 2020-02-24 DIAGNOSIS — C7949 Secondary malignant neoplasm of other parts of nervous system: Secondary | ICD-10-CM | POA: Diagnosis not present

## 2020-02-24 DIAGNOSIS — C9000 Multiple myeloma not having achieved remission: Secondary | ICD-10-CM | POA: Diagnosis not present

## 2020-02-27 ENCOUNTER — Other Ambulatory Visit: Payer: Self-pay

## 2020-02-27 ENCOUNTER — Ambulatory Visit
Admission: RE | Admit: 2020-02-27 | Discharge: 2020-02-27 | Disposition: A | Discharge status: Home / Self Care | Payer: Medicare PPO | Source: Ambulatory Visit | Attending: Radiation Oncology | Admitting: Radiation Oncology

## 2020-02-27 DIAGNOSIS — C9002 Multiple myeloma in relapse: Secondary | ICD-10-CM | POA: Diagnosis not present

## 2020-02-27 DIAGNOSIS — G893 Neoplasm related pain (acute) (chronic): Secondary | ICD-10-CM | POA: Diagnosis not present

## 2020-02-27 DIAGNOSIS — C7949 Secondary malignant neoplasm of other parts of nervous system: Secondary | ICD-10-CM | POA: Diagnosis not present

## 2020-02-27 DIAGNOSIS — C9000 Multiple myeloma not having achieved remission: Secondary | ICD-10-CM | POA: Diagnosis not present

## 2020-02-27 MED FILL — XARELTO 20 MG TABLET: 20 | 30 days supply | Qty: 30 | Fill #11

## 2020-02-28 ENCOUNTER — Encounter: Payer: Self-pay | Admitting: Radiation Oncology

## 2020-02-28 ENCOUNTER — Ambulatory Visit
Admission: RE | Admit: 2020-02-28 | Discharge: 2020-02-28 | Disposition: A | Discharge status: Home / Self Care | Payer: Medicare PPO | Source: Ambulatory Visit | Attending: Radiation Oncology | Admitting: Radiation Oncology

## 2020-02-28 ENCOUNTER — Other Ambulatory Visit: Payer: Self-pay

## 2020-02-28 DIAGNOSIS — Z510 Encounter for antineoplastic radiation therapy: Secondary | ICD-10-CM | POA: Diagnosis present

## 2020-02-28 DIAGNOSIS — C9000 Multiple myeloma not having achieved remission: Secondary | ICD-10-CM | POA: Diagnosis not present

## 2020-02-28 DIAGNOSIS — C9002 Multiple myeloma in relapse: Secondary | ICD-10-CM | POA: Diagnosis not present

## 2020-02-28 DIAGNOSIS — C7949 Secondary malignant neoplasm of other parts of nervous system: Secondary | ICD-10-CM | POA: Diagnosis not present

## 2020-02-28 DIAGNOSIS — G893 Neoplasm related pain (acute) (chronic): Secondary | ICD-10-CM | POA: Diagnosis not present

## 2020-02-29 ENCOUNTER — Other Ambulatory Visit: Payer: Self-pay

## 2020-02-29 ENCOUNTER — Inpatient Hospital Stay: Payer: Medicare PPO

## 2020-02-29 ENCOUNTER — Inpatient Hospital Stay: Payer: Medicare PPO | Attending: Oncology

## 2020-02-29 ENCOUNTER — Inpatient Hospital Stay (HOSPITAL_BASED_OUTPATIENT_CLINIC_OR_DEPARTMENT_OTHER): Payer: Medicare PPO | Admitting: Oncology

## 2020-02-29 VITALS — BP 112/67 | HR 89 | Temp 98.1°F | Resp 18 | Ht 71.0 in | Wt 170.8 lb

## 2020-02-29 DIAGNOSIS — C9000 Multiple myeloma not having achieved remission: Secondary | ICD-10-CM | POA: Diagnosis not present

## 2020-02-29 LAB — CMP (CANCER CENTER ONLY)
ALT: 10 U/L (ref 0–44)
AST: 17 U/L (ref 15–41)
Albumin: 3.4 g/dL — ABNORMAL LOW (ref 3.5–5.0)
Alkaline Phosphatase: 40 U/L (ref 38–126)
Anion gap: 8 (ref 5–15)
BUN: 9 mg/dL (ref 8–23)
CO2: 25 mmol/L (ref 22–32)
Calcium: 8.7 mg/dL — ABNORMAL LOW (ref 8.9–10.3)
Chloride: 109 mmol/L (ref 98–111)
Creatinine: 0.73 mg/dL (ref 0.61–1.24)
GFR, Estimated: >60 mL/min (ref >60)
Glucose, Bld: 89 mg/dL (ref 70–99)
Potassium: 4 mmol/L (ref 3.5–5.1)
Sodium: 142 mmol/L (ref 135–145)
Total Bilirubin: 0.8 mg/dL (ref 0.3–1.2)
Total Protein: 5.8 g/dL — ABNORMAL LOW (ref 6.5–8.1)

## 2020-02-29 LAB — CBC WITH DIFFERENTIAL (CANCER CENTER ONLY)
Abs Immature Granulocytes: 0.01 10*3/uL (ref 0.00–0.07)
Basophils Absolute: 0 10*3/uL (ref 0.0–0.1)
Basophils Relative: 0 %
Eosinophils Absolute: 0 10*3/uL (ref 0.0–0.5)
Eosinophils Relative: 1 %
HCT: 40.2 % (ref 39.0–52.0)
Hemoglobin: 12.8 g/dL — ABNORMAL LOW (ref 13.0–17.0)
Immature Granulocytes: 0 %
Lymphocytes Relative: 11 %
Lymphs Abs: 0.3 10*3/uL — ABNORMAL LOW (ref 0.7–4.0)
MCH: 27.9 pg (ref 26.0–34.0)
MCHC: 31.8 g/dL (ref 30.0–36.0)
MCV: 87.6 fL (ref 80.0–100.0)
Monocytes Absolute: 0.5 10*3/uL (ref 0.1–1.0)
Monocytes Relative: 16 %
Neutro Abs: 2.1 10*3/uL (ref 1.7–7.7)
Neutrophils Relative %: 72 %
Platelet Count: 54 10*3/uL — ABNORMAL LOW (ref 150–400)
RBC: 4.59 MIL/uL (ref 4.22–5.81)
RDW: 17.6 % — ABNORMAL HIGH (ref 11.5–15.5)
WBC Count: 2.9 10*3/uL — ABNORMAL LOW (ref 4.0–10.5)
nRBC: 0 % (ref 0.0–0.2)

## 2020-02-29 NOTE — Progress Notes (Signed)
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  He completed the course of palliative radiation yesterday.  The right chest pain has improved.  He is no longer taking pain medication.  Leg edema has improved with a diuretic.  He has returned to walking. He underwent a CT-guided biopsy of the right paraspinous mass on 02/20/2020.  The pathology returned consistent with a plasma cell neoplasm. Objective:  Vital signs in last 24 hours:  Blood pressure 112/67, pulse 89, temperature 98.1 F (36.7 C), temperature source Tympanic, resp. rate 18, height '5\' 11"'  (1.803 m), weight 170 lb 12.8 oz (77.5 kg), SpO2 100 %.    HEENT: No thrush Cardio: Regular rate and rhythm GI: No hepatosplenomegaly Vascular: Trace left greater than right lower leg edema  Skin: Eschar at the left lower lateral leg    Lab Results:  Lab Results  Component Value Date   WBC 2.9 (L) 02/29/2020   HGB 12.8 (L) 02/29/2020   HCT 40.2 02/29/2020   MCV 87.6 02/29/2020   PLT 54 (L) 02/29/2020   NEUTROABS 2.1 02/29/2020    CMP  Lab Results  Component Value Date   NA 142 02/29/2020   K 4.0 02/29/2020   CL 109 02/29/2020   CO2 25 02/29/2020   GLUCOSE 89 02/29/2020   BUN 9 02/29/2020   CREATININE 0.73 02/29/2020   CALCIUM 8.7 (L) 02/29/2020   PROT 5.8 (L) 02/29/2020   ALBUMIN 3.4 (L) 02/29/2020   AST 17 02/29/2020   ALT 10 02/29/2020   ALKPHOS 40 02/29/2020   BILITOT 0.8 02/29/2020   GFRNONAA >60 02/29/2020   GFRAA >60 10/26/2019     Medications: I have reviewed the patient's current medications.   Assessment/Plan: 1. Multiple myeloma-confirmed on a bone marrow biopsy 08/07/2014, IgA lambda  Serum M spike and increased serum free lambda light chains  Myeloma FISH panel negative for chromosome 4, 11, 12, 13, 14, and 17 abnormalities, cytogenetics with no metaphases  Bone survey 08/15/2014 with indeterminant  lucent skull lesions  Cycle 1 RVD 08/22/2014  Cycle 2 RVD 09/19/2014  Cycle 3 RVD 10/17/2014  Cycle 4 RVD 11/14/2014 (Decadron reduced to 20 mg weekly, Revlimid 15 mg days 1 through 14, Velcade 3/4 weeks)  Cycle 5 RVD 12/12/2014  Serum M spike not detected 12/26/2014  Cycle 6 RVD 01/09/2015  Maintenance Revlimid, 10 mg daily, 02/05/2015 , discontinue 02/26/2015 secondary to leg edema and diarrhea  Revlimid resumed at a dose of 10 mg every other day beginning 03/13/2015  PET scan 77/93/9030-SP hypermetabolic bone lesions, few lucent lesions in the spine and pelvis  Revlimid discontinued January 2019  Enrollment on a clinical trial at [facility] with pomalidomide/Decadron+/- ixazomibbeginning 03/10/2017  Treatment discontinued February 2021 secondary to rising serum lambda light chains  PET 04/05/2019-hypermetabolic right axillary/chest wall nodes, right paravertebral mass, small right pleural effusion, retrocrural node, right lateral abdominal wall mass, L5 lesion mild compression deformity. Focus of hypermetabolism at the anterior left pelvic wall without a CT correlate. Hypermetabolic lytic lesion in the left T3 transverse process  Cycle 1 daratumumab/carfilzomib/Decadron 04/05/2019  Radiation to the right chest wall mass and lumbar spine beginning 04/25/2019  Cycle 2 daratumumab/carfilzomib/Decadron 05/04/2019 (carfilzomib dose escalated); day 8 held due to neutropenia, thrombocytopenia; day 15 05/18/2019 (carfilzomib dose reduced)  Daratumumab/carfilzomib/Decadron changed to every 2-week dosing beginning 05/25/2019  Daratumumab changed to a monthly schedule beginning 09/21/2019; carfilzomib every 2 weeks  Daratumumab held 11/15/2019 secondary  to thrombocytopenia, carfilzomib continued at a reduced dose  CT chest 11/19/2019-T10 paraspinous mass enlarged compared to 09/13/2019, other bone lesions unchanged  Daratumumab/carfilzomib/Decadron  12/14/2019  Daratumumab/carfilzomib/Decadron 12/27/2019  Daratumumab/carfilzomib/Decadron 01/11/2020  PET 01/24/2020-general improvement in previously noted bone lesions with sclerosis, improvement in chest adenopathy, enlargement of right lower thoracic paraspinal/pleural mass and separate pleural tumor deposits on the right, increased right and left pleural effusions, new small to moderate pericardial effusion, prepatellar bursitis/edema on the right, inflammatory activity at the left lateral calf  Daratumumab/carfilzomib/Decadron 01/24/2020  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 History of intermittent prostatitis  4. Skin rash 10/24/2014-referred to dermatology, biopsy consistent with local hypersensitivity reaction  5. 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 Left lower leg cellulitis 06/24/2015-treated with Keflex  7. History of mild thrombocytopenia secondary tomyeloma and systemic therapy  8. History of mild neutropenia-secondary to myeloma and systemic therapy  9. Fall with a skull fracture and subarachnoid blood/right frontal lobe contusions 10/17/2015  10. Right lower lobe pulmonary embolus 03/28/2016. Lovenox , transitioned to Xarelto  11. Vesicular rashJune 2018-potentially related to the zoster vaccine, resolved  12. History of atrial fibrillation  13. Aortic stenosis-TAVR 10/25/2019  14. 07/02/2019-hospital admission for sepsis secondary to cellulitis of the left lower extremity with  gram-negative rod bacteremia(Aeromonascaviae)  15. Pleuritic chest pain - onset 10/2019, cardiac w/u negative   16. Right shoulder pain, possible bone spur on 04/2019 PET, likely MSK related (12/26/19)  17. Eczematous rash (12/26/19),  not felt to be drug rash/allergy. Recommended hydrocortisone topical PRN     Disposition: [name] completed the course of palliative radiation to the paraspinal mass.  Biopsy of the mass confirmed involvement by lambda light chain restricted myeloma.  His pain has improved.  He appears to be developing systemic progression of the myeloma.  I will contact Dr. Amalia Hailey to discuss treatment options.  I placed the carfilzomib and daratumumab on hold.  [name] will return for an office visit in 2 weeks.  [name], MD  02/29/2020  9:12 AM

## 2020-03-01 ENCOUNTER — Telehealth: Payer: Self-pay | Admitting: Oncology

## 2020-03-01 LAB — KAPPA/LAMBDA LIGHT CHAINS
Kappa free light chain: 1.7 mg/L — ABNORMAL LOW (ref 3.3–19.4)
Kappa, lambda light chain ratio: 0.03 — ABNORMAL LOW (ref 0.26–1.65)
Lambda free light chains: 66.3 mg/L — ABNORMAL HIGH (ref 5.7–26.3)

## 2020-03-01 LAB — IGA: IgA: 23 mg/dL — ABNORMAL LOW (ref 61–437)

## 2020-03-01 NOTE — Telephone Encounter (Signed)
Scheduled appointment per 2/2 los. Spoke to patient who is aware of appointment date and time.  °

## 2020-03-05 ENCOUNTER — Other Ambulatory Visit: Payer: Self-pay

## 2020-03-05 ENCOUNTER — Ambulatory Visit (HOSPITAL_COMMUNITY): Payer: Medicare PPO | Attending: Cardiovascular Disease

## 2020-03-05 DIAGNOSIS — I313 Pericardial effusion (noninflammatory): Secondary | ICD-10-CM | POA: Insufficient documentation

## 2020-03-05 DIAGNOSIS — I3139 Other pericardial effusion (noninflammatory): Secondary | ICD-10-CM

## 2020-03-05 LAB — ECHOCARDIOGRAM LIMITED
Area-P 1/2: 2.76 cm2
MV VTI: 1.78 cm2
S' Lateral: 2.8 cm

## 2020-03-05 NOTE — Progress Notes (Signed)
  Radiation Oncology         [phone] ________________________________  Name: MABLE LASHLEY MRN: [id number]  Date: 02/28/2020  DOB: 11/30/1934  End of Treatment Note  Diagnosis:   86 yo man with a painful myeloma lesion at T10 and in the right paraspinal space.     Indication for treatment:  Palliation of pain, preservation of spinal cord       Radiation treatment dates:   02/15/20-02/28/20  Site/dose:   The spinal and paraspinal mass at right of T10 was treated to 20 Gy in 10 fractions of 2 Gy  Beams/energy:   The treatment was 3D conformal with 2 static gantry angles, 180 and 270 in addition to a dynamic conformal arc all delivering [facility] shaped 6 MV X-rays  Narrative: The patient tolerated radiation treatment relatively well.   His pain improved.  He did not notice much esophagitis.  Plan: The patient has completed radiation treatment. The patient will return to radiation oncology clinic for routine followup in one month. I advised him to call or return sooner if he has any questions or concerns related to his recovery or treatment. ________________________________  Sheral Apley. Tammi Klippel, M.D.

## 2020-03-12 ENCOUNTER — Telehealth: Payer: Self-pay

## 2020-03-12 NOTE — Telephone Encounter (Signed)
Spoke wit pt per MD Benay Spice regarding his upcoming appt. Pt verbalizes understanding that scheduling will call him to confirm his 03/21/20 appt.

## 2020-03-14 ENCOUNTER — Inpatient Hospital Stay: Payer: Medicare PPO | Admitting: Oncology

## 2020-03-20 DIAGNOSIS — D61810 Antineoplastic chemotherapy induced pancytopenia: Secondary | ICD-10-CM | POA: Diagnosis not present

## 2020-03-20 DIAGNOSIS — D801 Nonfamilial hypogammaglobulinemia: Secondary | ICD-10-CM | POA: Diagnosis not present

## 2020-03-20 DIAGNOSIS — I2699 Other pulmonary embolism without acute cor pulmonale: Secondary | ICD-10-CM | POA: Diagnosis not present

## 2020-03-20 DIAGNOSIS — C9002 Multiple myeloma in relapse: Secondary | ICD-10-CM | POA: Diagnosis not present

## 2020-03-21 ENCOUNTER — Other Ambulatory Visit: Payer: Self-pay

## 2020-03-21 ENCOUNTER — Telehealth: Payer: Self-pay

## 2020-03-21 ENCOUNTER — Inpatient Hospital Stay (HOSPITAL_BASED_OUTPATIENT_CLINIC_OR_DEPARTMENT_OTHER): Payer: Medicare PPO | Admitting: Oncology

## 2020-03-21 VITALS — BP 106/66 | HR 78 | Temp 99.1°F | Resp 20 | Ht 71.0 in | Wt 175.8 lb

## 2020-03-21 DIAGNOSIS — C9000 Multiple myeloma not having achieved remission: Secondary | ICD-10-CM

## 2020-03-21 NOTE — Progress Notes (Signed)
DISCONTINUE ON PATHWAY REGIMEN - Multiple Myeloma and Other Plasma Cell Dyscrasias     Cycle 1: A cycle is 28 days:     Dexamethasone      Carfilzomib      Carfilzomib      Daratumumab    Cycle 2: A cycle is 28 days:     Dexamethasone      Carfilzomib      Daratumumab    Cycles 3 through 6: A cycle is every 28 days:     Dexamethasone      Dexamethasone      Carfilzomib      Daratumumab    Cycles 7 and beyond: A cycle is every 28 days:     Dexamethasone      Dexamethasone      Carfilzomib      Daratumumab   **Always confirm dose/schedule in your pharmacy ordering system**  REASON: Disease Progression PRIOR TREATMENT: [id number]: DaraKd (Daratumumab 16 mg/kg IV + Carfilzomib 20/70 mg/m2 D1, 8, 15 + Dexamethasone 20/40 mg PO/IV) Until Progression or Unacceptable Toxicity TREATMENT RESPONSE: Partial Response (PR)  START OFF PATHWAY REGIMEN - Multiple Myeloma and Other Plasma Cell Dyscrasias   [id number]:Bendamustine 120 mg/m2 IV D1,2 + G-CSF q28 Days x 6 Cycles:   A cycle is every 28 days:     Bendamustine      Pegfilgrastim-xxxx   **Always confirm dose/schedule in your pharmacy ordering system**  Patient Characteristics: Multiple Myeloma, Relapsed / Refractory, Second through Fourth Lines of Therapy, Frail or Not a Candidate for Triplet Therapy Disease Classification: Multiple Myeloma R-ISS Staging: Unknown Therapeutic Status: Relapsed Line of Therapy: Fourth Line Intent of Therapy: Non-Curative / Palliative Intent, Discussed with Patient

## 2020-03-21 NOTE — Progress Notes (Signed)
Provided reading information on bendamustine. Patient tells RN that he has good veins-never have trouble with sticks.

## 2020-03-21 NOTE — Progress Notes (Signed)
ON PATHWAY REGIMEN - Multiple Myeloma and Other Plasma Cell Dyscrasias  No Change  Continue With Treatment as Ordered.  Original Decision Date/Time: 03/28/2019 10:10     Cycle 1: A cycle is 28 days:     Dexamethasone      Carfilzomib      Carfilzomib      Daratumumab    Cycle 2: A cycle is 28 days:     Dexamethasone      Carfilzomib      Daratumumab    Cycles 3 through 6: A cycle is every 28 days:     Dexamethasone      Dexamethasone      Carfilzomib      Daratumumab    Cycles 7 and beyond: A cycle is every 28 days:     Dexamethasone      Dexamethasone      Carfilzomib      Daratumumab   **Always confirm dose/schedule in your pharmacy ordering system**  Patient Characteristics: Multiple Myeloma, Relapsed / Refractory, Second through Fourth Lines of Therapy Disease Classification: Multiple Myeloma R-ISS Staging: Unknown Therapeutic Status: Relapsed Line of Therapy: Third Line Intent of Therapy: Non-Curative / Palliative Intent, Discussed with Patient

## 2020-03-21 NOTE — Progress Notes (Signed)
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  He no longer has right chest or back pain.  He is walking daily.  No new complaint. He saw Dr. Amalia Hailey for a telehealth visit yesterday.  Objective:  Vital signs in last 24 hours:  Blood pressure 106/66, pulse 78, temperature 99.1 F (37.3 C), temperature source Tympanic, resp. rate 20, height '5\' 11"'  (1.803 m), weight 175 lb 12.8 oz (79.7 kg), SpO2 97 %.     Resp: Lungs clear bilaterally Cardio: Regular rate and rhythm, 2/6 diastolic murmur GI: No hepatosplenomegaly Vascular: Trace lower leg edema bilaterally  Skin: Scabbed superficial ulceration at the left lower leg    Lab Results:  Lab Results  Component Value Date   WBC 2.9 (L) 02/29/2020   HGB 12.8 (L) 02/29/2020   HCT 40.2 02/29/2020   MCV 87.6 02/29/2020   PLT 54 (L) 02/29/2020   NEUTROABS 2.1 02/29/2020    CMP  Lab Results  Component Value Date   NA 142 02/29/2020   K 4.0 02/29/2020   CL 109 02/29/2020   CO2 25 02/29/2020   GLUCOSE 89 02/29/2020   BUN 9 02/29/2020   CREATININE 0.73 02/29/2020   CALCIUM 8.7 (L) 02/29/2020   PROT 5.8 (L) 02/29/2020   ALBUMIN 3.4 (L) 02/29/2020   AST 17 02/29/2020   ALT 10 02/29/2020   ALKPHOS 40 02/29/2020   BILITOT 0.8 02/29/2020   GFRNONAA >60 02/29/2020   GFRAA >60 10/26/2019    Medications: I have reviewed the patient's current medications.   Assessment/Plan: 1. Multiple myeloma-confirmed on a bone marrow biopsy 08/07/2014, IgA lambda  Serum M spike and increased serum free lambda light chains  Myeloma FISH panel negative for chromosome 4, 11, 12, 13, 14, and 17 abnormalities, cytogenetics with no metaphases  Bone survey 08/15/2014 with indeterminant lucent skull lesions  Cycle 1 RVD 08/22/2014  Cycle 2 RVD 09/19/2014  Cycle 3 RVD 10/17/2014  Cycle 4 RVD 11/14/2014 (Decadron reduced to 20 mg weekly, Revlimid 15  mg days 1 through 14, Velcade 3/4 weeks)  Cycle 5 RVD 12/12/2014  Serum M spike not detected 12/26/2014  Cycle 6 RVD 01/09/2015  Maintenance Revlimid, 10 mg daily, 02/05/2015 , discontinue 02/26/2015 secondary to leg edema and diarrhea  Revlimid resumed at a dose of 10 mg every other day beginning 03/13/2015  PET scan 30/16/0109-NA hypermetabolic bone lesions, few lucent lesions in the spine and pelvis  Revlimid discontinued January 2019  Enrollment on a clinical trial at [facility] with pomalidomide/Decadron+/- ixazomibbeginning 03/10/2017  Treatment discontinued February 2021 secondary to rising serum lambda light chains  PET 04/05/2019-hypermetabolic right axillary/chest wall nodes, right paravertebral mass, small right pleural effusion, retrocrural node, right lateral abdominal wall mass, L5 lesion mild compression deformity. Focus of hypermetabolism at the anterior left pelvic wall without a CT correlate. Hypermetabolic lytic lesion in the left T3 transverse process  Cycle 1 daratumumab/carfilzomib/Decadron 04/05/2019  Radiation to the right chest wall mass and lumbar spine beginning 04/25/2019  Cycle 2 daratumumab/carfilzomib/Decadron 05/04/2019 (carfilzomib dose escalated); day 8 held due to neutropenia, thrombocytopenia; day 15 05/18/2019 (carfilzomib dose reduced)  Daratumumab/carfilzomib/Decadron changed to every 2-week dosing beginning 05/25/2019  Daratumumab changed to a monthly schedule beginning 09/21/2019; carfilzomib every 2 weeks  Daratumumab held 11/15/2019 secondary to thrombocytopenia, carfilzomib continued at a reduced dose  CT chest 11/19/2019-T10 paraspinous mass enlarged compared to 09/13/2019, other bone lesions unchanged  Daratumumab/carfilzomib/Decadron 12/14/2019  Daratumumab/carfilzomib/Decadron 12/27/2019  Daratumumab/carfilzomib/Decadron 01/11/2020  PET 01/24/2020-general improvement in previously noted bone lesions with sclerosis, improvement in chest  adenopathy, enlargement of right lower thoracic paraspinal/pleural mass and separate pleural tumor deposits on the right, increased right and left pleural effusions, new small to moderate pericardial effusion, prepatellar bursitis/edema on the right, inflammatory activity at the left lateral calf  Daratumumab/carfilzomib/Decadron 01/24/2020  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 History of intermittent prostatitis  4. Skin rash 10/24/2014-referred to dermatology, biopsy consistent with local hypersensitivity reaction  5. 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 Left lower leg cellulitis 06/24/2015-treated with Keflex  7. History of mild thrombocytopenia secondary tomyeloma and systemic therapy  8. History of mild neutropenia-secondary to myeloma and systemic therapy  9. Fall with a skull fracture and subarachnoid blood/right frontal lobe contusions 10/17/2015  10. Right lower lobe pulmonary embolus 03/28/2016. Lovenox , transitioned to Xarelto  11. Vesicular rashJune 2018-potentially related to the zoster vaccine, resolved  12. History of atrial fibrillation  13. Aortic stenosis-TAVR 10/25/2019  14. 07/02/2019-hospital admission for sepsis secondary to cellulitis of the left lower extremity with gram-negative rod bacteremia(Aeromonascaviae)  15. Pleuritic chest pain - onset 10/2019, cardiac w/u negative   16. Right shoulder pain, possible bone spur on 04/2019 PET, likely MSK related (12/26/19)  17.  Eczematous rash (12/26/19),  not felt to be drug rash/allergy. 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  He has been treated with multiple systemic therapy regimens, most recently carfilzomib, daratumumab, and Decadron.  He completed palliative radiation to an enlarging paraspinous mass with improvement in pain.  The lambda free light chains are mildly elevated.  I reviewed the note from Dr. Amalia Hailey.  He recommends changing treatment to single agent bendamustine.  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  We discussed continuing the current treatment, observation, and proceeding with bendamustine.  [name] would like to proceed with bendamustine treatment.  We reviewed potential toxicities associated with bendamustine including the chance of nausea, rash, and hematologic toxicity.  He agrees to proceed.  He appears to be a candidate for Evusheld therapy.  We reviewed potential toxicities associated with Evusheld including the chance of cardiac toxicity.  I will contact Dr. Burt Knack to be sure he is in agreement with Evusheld.  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  He will be seen for an office visit and nadir CBC on 04/11/2020.  The bendamustine will be dose reduced to 70 mg/m based on his pretreatment and baseline thrombocytopenia.  We will check a platelet count prior to chemotherapy on 03/28/2020 and consider a further dose reduction.  [name], MD  03/21/2020  2:10 PM

## 2020-03-21 NOTE — Telephone Encounter (Signed)
Left patient a voicemail message to call back in regards to 1 month  telephone visit with Freeman Caldron PA on 03/28/20 @ 8:30am. Called to review patient medications. TM

## 2020-03-22 ENCOUNTER — Other Ambulatory Visit: Payer: Self-pay | Admitting: *Deleted

## 2020-03-22 ENCOUNTER — Telehealth: Payer: Self-pay | Admitting: Oncology

## 2020-03-22 DIAGNOSIS — C9000 Multiple myeloma not having achieved remission: Secondary | ICD-10-CM

## 2020-03-22 NOTE — Telephone Encounter (Signed)
Scheduled appointments per 2/23 los. Spoke to patient who is aware of appointments dates and times.

## 2020-03-22 NOTE — Progress Notes (Signed)
Per [name]: Referral for Evusheld injection placed.

## 2020-03-26 ENCOUNTER — Telehealth: Payer: Self-pay

## 2020-03-26 NOTE — Telephone Encounter (Signed)
Spoke with patient wife in regards to telephone appointment with Freeman Caldron PA on 03/28/20 @ 8:30am. Wife verbalized understanding of appointment date and time. Reviewed patient medications. TM

## 2020-03-27 ENCOUNTER — Other Ambulatory Visit: Payer: Self-pay | Admitting: Oncology

## 2020-03-28 ENCOUNTER — Other Ambulatory Visit: Payer: Self-pay | Admitting: Oncology

## 2020-03-28 ENCOUNTER — Telehealth: Payer: Self-pay | Admitting: *Deleted

## 2020-03-28 ENCOUNTER — Inpatient Hospital Stay: Payer: Medicare PPO

## 2020-03-28 ENCOUNTER — Encounter: Payer: Self-pay | Admitting: *Deleted

## 2020-03-28 ENCOUNTER — Ambulatory Visit
Admission: RE | Admit: 2020-03-28 | Discharge: 2020-03-28 | Disposition: A | Discharge status: Home / Self Care | Payer: Medicare PPO | Source: Ambulatory Visit | Attending: Urology | Admitting: Urology

## 2020-03-28 ENCOUNTER — Inpatient Hospital Stay: Payer: Medicare PPO | Attending: Oncology

## 2020-03-28 ENCOUNTER — Other Ambulatory Visit: Payer: Self-pay

## 2020-03-28 DIAGNOSIS — Z9221 Personal history of antineoplastic chemotherapy: Secondary | ICD-10-CM | POA: Diagnosis not present

## 2020-03-28 DIAGNOSIS — I2699 Other pulmonary embolism without acute cor pulmonale: Secondary | ICD-10-CM

## 2020-03-28 DIAGNOSIS — Z923 Personal history of irradiation: Secondary | ICD-10-CM | POA: Diagnosis not present

## 2020-03-28 DIAGNOSIS — Z298 Encounter for other specified prophylactic measures: Secondary | ICD-10-CM | POA: Diagnosis not present

## 2020-03-28 DIAGNOSIS — C9000 Multiple myeloma not having achieved remission: Secondary | ICD-10-CM | POA: Insufficient documentation

## 2020-03-28 DIAGNOSIS — C9002 Multiple myeloma in relapse: Secondary | ICD-10-CM

## 2020-03-28 LAB — CMP (CANCER CENTER ONLY)
ALT: 16 U/L (ref 0–44)
AST: 20 U/L (ref 15–41)
Albumin: 3.4 g/dL — ABNORMAL LOW (ref 3.5–5.0)
Alkaline Phosphatase: 40 U/L (ref 38–126)
Anion gap: 8 (ref 5–15)
BUN: 9 mg/dL (ref 8–23)
CO2: 25 mmol/L (ref 22–32)
Calcium: 8.7 mg/dL — ABNORMAL LOW (ref 8.9–10.3)
Chloride: 108 mmol/L (ref 98–111)
Creatinine: 0.77 mg/dL (ref 0.61–1.24)
GFR, Estimated: >60 mL/min (ref >60)
Glucose, Bld: 114 mg/dL — ABNORMAL HIGH (ref 70–99)
Potassium: 3.9 mmol/L (ref 3.5–5.1)
Sodium: 141 mmol/L (ref 135–145)
Total Bilirubin: 0.5 mg/dL (ref 0.3–1.2)
Total Protein: 5.6 g/dL — ABNORMAL LOW (ref 6.5–8.1)

## 2020-03-28 LAB — CBC WITH DIFFERENTIAL (CANCER CENTER ONLY)
Abs Immature Granulocytes: 0 10*3/uL (ref 0.00–0.07)
Basophils Absolute: 0 10*3/uL (ref 0.0–0.1)
Basophils Relative: 1 %
Eosinophils Absolute: 0.1 10*3/uL (ref 0.0–0.5)
Eosinophils Relative: 2 %
HCT: 39.3 % (ref 39.0–52.0)
Hemoglobin: 12.6 g/dL — ABNORMAL LOW (ref 13.0–17.0)
Immature Granulocytes: 0 %
Lymphocytes Relative: 27 %
Lymphs Abs: 0.8 10*3/uL (ref 0.7–4.0)
MCH: 28.4 pg (ref 26.0–34.0)
MCHC: 32.1 g/dL (ref 30.0–36.0)
MCV: 88.7 fL (ref 80.0–100.0)
Monocytes Absolute: 0.4 10*3/uL (ref 0.1–1.0)
Monocytes Relative: 13 %
Neutro Abs: 1.6 10*3/uL — ABNORMAL LOW (ref 1.7–7.7)
Neutrophils Relative %: 57 %
Platelet Count: 42 10*3/uL — ABNORMAL LOW (ref 150–400)
RBC: 4.43 MIL/uL (ref 4.22–5.81)
RDW: 17.3 % — ABNORMAL HIGH (ref 11.5–15.5)
WBC Count: 2.9 10*3/uL — ABNORMAL LOW (ref 4.0–10.5)
nRBC: 0 % (ref 0.0–0.2)

## 2020-03-28 MED FILL — XARELTO 20 MG TABLET: 20 | 30 days supply | Qty: 30 | Fill #0

## 2020-03-28 NOTE — Progress Notes (Signed)
Radiation Oncology         [phone] ________________________________  Name: Mitchell Lowery MRN: [id number]  Date: 03/28/2020  DOB: 1934/11/22  Post Treatment Note  CC: [name], MD  [name], MD  Diagnosis:   86 yo man with a painful myeloma lesion at T10 and in the right paraspinal space.     Interval Since Last Radiation:  4 weeks  02/15/20-02/28/20:   The spinal and paraspinal mass at right of T10 was treated to 20 Gy in 10 fractions of 2 Gy  04/25/19-05/09/19: 1. The L5 lesion was treated to 20 Gy in 10 fractions of 2 Gy 2. The right lateral rib lesion was treated to 20 Gy in 10 fractions of 2 Gy  05/17/2014-06/21/2014:The prostate, seminal vesicles and pelvic lymph nodes were treated to 45 Gy in 25 fractions of 1.8 Gy  07/21/2014:Insertion of radioactive I-125 seeds into the prostate gland;110Gy, boosttherapy.(Manning/Dahlststedt)  Narrative: I spoke with the patient to conduct his routine scheduled 1 month follow up visit via telephone to spare the patient unnecessary potential exposure in the healthcare setting during the current COVID-19 pandemic.  The patient was notified in advance and gave permission to proceed with this visit format.  He tolerated radiation treatment relatively well.   His pain improved and he did not notice much esophagitis.                             On review of systems, the patient states that he is doing very well in general.  He reports resolution of the left-sided rib/back pain and is quite pleased with his progress to date.  He has been able to get back to some regular exercise, walking daily and is really without complaints.  ALLERGIES:  is allergic to bee venom.  Meds: Current Outpatient Medications  Medication Sig Dispense Refill  . Cholecalciferol 2000 units TABS Take 2,000 Units by mouth every evening.     . furosemide (LASIX) 20 MG tablet Take 1 tablet (20 mg total) by mouth daily. 30 tablet 11  . losartan (COZAAR) 50  MG tablet Take 50 mg by mouth every evening.     Marland Kitchen METAMUCIL FIBER PO Take 1 Dose by mouth daily as needed (constipation).    . metoprolol succinate (TOPROL XL) 25 MG 24 hr tablet Take 0.5 tablets (12.5 mg total) by mouth daily. 45 tablet 3  . valACYclovir (VALTREX) 500 MG tablet Take 500 mg by mouth every evening.     . vitamin B-12 (CYANOCOBALAMIN) 500 MCG tablet Take 500 mcg by mouth every evening.     Alveda Reasons 20 MG TABS tablet TAKE 1 TABLET BY MOUTH DAILY WITH SUPPER. (Patient taking differently: Take 20 mg by mouth daily with supper.) 30 tablet 11  . acetaminophen (TYLENOL) 500 MG tablet Take 1,000 mg by mouth every 6 (six) hours as needed for moderate pain. (Patient not taking: No sig reported)    . amoxicillin (AMOXIL) 500 MG tablet Take 4 tablets (2,000 mg) one hour prior to all dental visits. (Patient not taking: No sig reported) 8 tablet 12  . HYDROcodone-acetaminophen (NORCO/VICODIN) 5-325 MG tablet Take 1-2 tablets by mouth every 6 (six) hours as needed for moderate pain. (Patient not taking: No sig reported) 60 tablet 0  . loperamide (IMODIUM) 2 MG capsule Take 2-4 mg by mouth 4 (four) times daily as needed for diarrhea or loose stools. (Patient not taking: No sig reported)    .  melatonin 5 MG TABS Take 5 mg by mouth at bedtime as needed (sleep). (Patient not taking: No sig reported)    . traMADol-acetaminophen (ULTRACET) 37.5-325 MG tablet Take 1 tablet by mouth every 6 (six) hours as needed. (Patient not taking: No sig reported) 30 tablet 0   No current facility-administered medications for this encounter.    Physical Findings:  vitals were not taken for this visit.   /Unable to assess due to telephone follow-up visit format.  Lab Findings: Lab Results  Component Value Date   WBC 2.9 (L) 03/28/2020   HGB 12.6 (L) 03/28/2020   HCT 39.3 03/28/2020   MCV 88.7 03/28/2020   PLT 42 (L) 03/28/2020     Radiographic Findings: ECHOCARDIOGRAM LIMITED  Result Date: 03/05/2020     ECHOCARDIOGRAM LIMITED REPORT   Patient Name:   Mitchell Lowery Date of Exam: 03/05/2020 Medical Rec #:  [id number]        Height:       71.0 in Accession #:    [id number]       Weight:       170.8 lb Date of Birth:  09/14/1934        BSA:          1.972 m Patient Age:    67 years         BP:           112/67 mmHg Patient Gender: M                HR:           74 bpm. Exam Location:  [facility] Procedure: Limited Echo, Limited Color Doppler and Cardiac Doppler Indications:    I31.3 Pericardial effusion  History:        Patient has prior history of Echocardiogram examinations, most                 recent 01/26/2020. Pericardial effusion.                 Aortic Valve: 29 mm Sapien prosthetic, stented (TAVR) valve is                 present in the aortic position. Procedure Date: 10/25/2019.  Sonographer:    Coralyn Helling RDCS Referring Phys: [place]  1. Limited TTE for pericardial effusion. A trivial effusion is present. 29 mm S3 TAVR present without evidence of regurgitation. CW doppler interrogation not performed.  2. Left ventricular ejection fraction, by estimation, is 60 to 65%. The left ventricle has normal function. Left ventricular diastolic function could not be evaluated.  3. Right ventricular systolic function is normal. The right ventricular size is normal.  4. The mitral valve is degenerative. No evidence of mitral valve regurgitation. Mild mitral stenosis. The mean mitral valve gradient is 3.1 mmHg with average heart rate of 74 bpm. Moderate to severe mitral annular calcification.  5. Aortic valve regurgitation is not visualized. There is a 29 mm Sapien prosthetic (TAVR) valve present in the aortic position. Procedure Date: 10/25/2019.  6. The inferior vena cava is normal in size with greater than 50% respiratory variability, suggesting right atrial pressure of 3 mmHg. Comparison(s): No significant change from prior study. FINDINGS  Left Ventricle: Left ventricular ejection  fraction, by estimation, is 60 to 65%. The left ventricle has normal function. The left ventricular internal cavity size was normal in size. There is no left ventricular hypertrophy. Left ventricular diastolic function could not be  evaluated. Left ventricular diastolic function could not be evaluated due to mitral annular calcification (moderate or greater). Right Ventricle: The right ventricular size is normal. No increase in right ventricular wall thickness. Right ventricular systolic function is normal. Pericardium: Trivial pericardial effusion is present. Mitral Valve: The mitral valve is degenerative in appearance. Moderate to severe mitral annular calcification. Mild mitral valve stenosis. The mean mitral valve gradient is 3.1 mmHg with average heart rate of 74 bpm. Aortic Valve: Aortic valve regurgitation is not visualized. There is a 29 mm Sapien prosthetic, stented (TAVR) valve present in the aortic position. Procedure Date: 10/25/2019. Venous: The inferior vena cava is normal in size with greater than 50% respiratory variability, suggesting right atrial pressure of 3 mmHg. LEFT VENTRICLE PLAX 2D LVIDd:         3.90 cm  Diastology LVIDs:         2.80 cm  LV e' medial:    5.11 cm/s LV PW:         1.00 cm  LV E/e' medial:  20.0 LV IVS:        1.10 cm  LV e' lateral:   5.00 cm/s LVOT diam:     2.35 cm  LV E/e' lateral: 20.4 LV SV:         63 LV SV Index:   32 LVOT Area:     4.34 cm  IVC IVC diam: 1.50 cm LEFT ATRIUM         Index      RIGHT ATRIUM LA diam:    4.20 cm 2.13 cm/m RA Pressure: 3.00 mmHg  AORTIC VALVE LVOT Vmax:   71.10 cm/s LVOT Vmean:  49.500 cm/s LVOT VTI:    0.146 m  AORTA Ao Root diam: 3.00 cm MITRAL VALVE                TRICUSPID VALVE MV Area (PHT):              Estimated RAP:  3.00 mmHg MV Area VTI:   1.78 cm MV Mean grad:  3.1 mmHg     SHUNTS MV VTI:        0.36 m       Systemic VTI:  0.15 m                             Systemic Diam: 2.35 cm MV E velocity: 102.00 cm/s MV A velocity:  154.00 cm/s MV E/A ratio:  0.66 Eleonore Chiquito MD Electronically signed by Eleonore Chiquito MD Signature Date/Time: 03/05/2020/11:56:37 AM    Final     Impression/Plan: 63. 86 yo man with a painful myeloma lesion at T10 and in the right paraspinal space.  He appears to have recovered well from the effects of his recent radiotherapy and is currently without complaints.  He is quite pleased with the resolution of pain and his ability to resume activities comfortably.  We discussed that while we are happy to continue to participate in his care if clinically indicated, at this point, we will plan to see him back on an as-needed basis.  He will continue his routine follow-up under the care and direction of [name] for continued management of his systemic disease and knows that he is welcome to call at anytime with any questions or concerns related to his previous radiation.      Nicholos Johns, PA-C

## 2020-03-28 NOTE — Telephone Encounter (Signed)
Notified of appointments on 04/13/20 at 0930/1000.

## 2020-03-28 NOTE — Progress Notes (Signed)
Per [name]: Platelet count too low today for treatment. Patient can stay for him to discuss or he can be called at home. Patient chose to go home and await call. Left appt. 3/3 for now.

## 2020-03-29 ENCOUNTER — Inpatient Hospital Stay: Payer: Medicare PPO

## 2020-03-29 LAB — KAPPA/LAMBDA LIGHT CHAINS
Kappa free light chain: 3.5 mg/L (ref 3.3–19.4)
Kappa, lambda light chain ratio: 0.07 — ABNORMAL LOW (ref 0.26–1.65)
Lambda free light chains: 50.6 mg/L — ABNORMAL HIGH (ref 5.7–26.3)

## 2020-03-29 LAB — IGA: IgA: 12 mg/dL — ABNORMAL LOW (ref 61–437)

## 2020-04-03 DIAGNOSIS — D61818 Other pancytopenia: Secondary | ICD-10-CM | POA: Diagnosis not present

## 2020-04-03 DIAGNOSIS — N400 Enlarged prostate without lower urinary tract symptoms: Secondary | ICD-10-CM | POA: Diagnosis not present

## 2020-04-03 DIAGNOSIS — C61 Malignant neoplasm of prostate: Secondary | ICD-10-CM | POA: Diagnosis not present

## 2020-04-03 DIAGNOSIS — I10 Essential (primary) hypertension: Secondary | ICD-10-CM | POA: Diagnosis not present

## 2020-04-03 DIAGNOSIS — D649 Anemia, unspecified: Secondary | ICD-10-CM | POA: Diagnosis not present

## 2020-04-03 DIAGNOSIS — I4891 Unspecified atrial fibrillation: Secondary | ICD-10-CM | POA: Diagnosis not present

## 2020-04-04 ENCOUNTER — Ambulatory Visit: Payer: Medicare PPO | Admitting: Cardiovascular Disease

## 2020-04-09 ENCOUNTER — Other Ambulatory Visit: Payer: Self-pay | Admitting: Student
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
## 2020-04-10 ENCOUNTER — Encounter (HOSPITAL_COMMUNITY): Payer: Self-pay

## 2020-04-10 ENCOUNTER — Ambulatory Visit (HOSPITAL_COMMUNITY)
Admission: RE | Admit: 2020-04-10 | Discharge: 2020-04-10 | Disposition: A | Discharge status: Home / Self Care | Payer: Medicare PPO | Source: Ambulatory Visit | Attending: Oncology | Admitting: Oncology

## 2020-04-10 ENCOUNTER — Other Ambulatory Visit: Payer: Self-pay

## 2020-04-10 ENCOUNTER — Ambulatory Visit (HOSPITAL_COMMUNITY)
Admission: RE | Admit: 2020-04-10 | Discharge: 2020-04-10 | Disposition: A | Discharge status: Home / Self Care | Payer: Medicare PPO | Source: Ambulatory Visit

## 2020-04-10 DIAGNOSIS — Z79899 Other long term (current) drug therapy: Secondary | ICD-10-CM | POA: Insufficient documentation

## 2020-04-10 DIAGNOSIS — Z7901 Long term (current) use of anticoagulants: Secondary | ICD-10-CM | POA: Diagnosis not present

## 2020-04-10 DIAGNOSIS — I480 Paroxysmal atrial fibrillation: Secondary | ICD-10-CM | POA: Diagnosis not present

## 2020-04-10 DIAGNOSIS — D696 Thrombocytopenia, unspecified: Secondary | ICD-10-CM | POA: Diagnosis not present

## 2020-04-10 DIAGNOSIS — C9000 Multiple myeloma not having achieved remission: Secondary | ICD-10-CM | POA: Insufficient documentation

## 2020-04-10 DIAGNOSIS — Z86711 Personal history of pulmonary embolism: Secondary | ICD-10-CM | POA: Diagnosis not present

## 2020-04-10 DIAGNOSIS — Z8546 Personal history of malignant neoplasm of prostate: Secondary | ICD-10-CM | POA: Diagnosis not present

## 2020-04-10 DIAGNOSIS — I2510 Atherosclerotic heart disease of native coronary artery without angina pectoris: Secondary | ICD-10-CM | POA: Diagnosis not present

## 2020-04-10 DIAGNOSIS — D4989 Neoplasm of unspecified behavior of other specified sites: Secondary | ICD-10-CM | POA: Diagnosis not present

## 2020-04-10 LAB — CBC WITH DIFFERENTIAL/PLATELET
Abs Immature Granulocytes: 0.01 10*3/uL (ref 0.00–0.07)
Basophils Absolute: 0 10*3/uL (ref 0.0–0.1)
Basophils Relative: 1 %
Eosinophils Absolute: 0.1 10*3/uL (ref 0.0–0.5)
Eosinophils Relative: 2 %
HCT: 41.9 % (ref 39.0–52.0)
Hemoglobin: 13.5 g/dL (ref 13.0–17.0)
Immature Granulocytes: 0 %
Lymphocytes Relative: 33 %
Lymphs Abs: 1.1 10*3/uL (ref 0.7–4.0)
MCH: 29.2 pg (ref 26.0–34.0)
MCHC: 32.2 g/dL (ref 30.0–36.0)
MCV: 90.5 fL (ref 80.0–100.0)
Monocytes Absolute: 0.4 10*3/uL (ref 0.1–1.0)
Monocytes Relative: 13 %
Neutro Abs: 1.8 10*3/uL (ref 1.7–7.7)
Neutrophils Relative %: 51 %
Platelets: 54 10*3/uL — ABNORMAL LOW (ref 150–400)
RBC: 4.63 MIL/uL (ref 4.22–5.81)
RDW: 16.6 % — ABNORMAL HIGH (ref 11.5–15.5)
WBC: 3.4 10*3/uL — ABNORMAL LOW (ref 4.0–10.5)
nRBC: 0 % (ref 0.0–0.2)

## 2020-04-10 MED ORDER — FENTANYL CITRATE (PF) 100 MCG/2ML IJ SOLN
INTRAMUSCULAR | Status: AC | PRN
  Administered 2020-04-10 (×2): 50 ug via INTRAVENOUS

## 2020-04-10 MED ORDER — SODIUM CHLORIDE 0.9 % IV SOLN: INTRAVENOUS | Status: DC

## 2020-04-10 MED ORDER — MIDAZOLAM HCL 2 MG/2ML IJ SOLN
INTRAMUSCULAR | Status: AC
  Filled 2020-04-10: qty 4

## 2020-04-10 MED ORDER — LIDOCAINE HCL (PF) 1 % IJ SOLN
INTRAMUSCULAR | Status: AC | PRN
  Administered 2020-04-10: 10 mL

## 2020-04-10 MED ORDER — MIDAZOLAM HCL 2 MG/2ML IJ SOLN
INTRAMUSCULAR | Status: AC | PRN
  Administered 2020-04-10 (×2): 1 mg via INTRAVENOUS

## 2020-04-10 MED ORDER — FENTANYL CITRATE (PF) 100 MCG/2ML IJ SOLN
INTRAMUSCULAR | Status: AC
  Filled 2020-04-10: qty 2

## 2020-04-10 NOTE — Procedures (Signed)
Pre-procedure Diagnosis: CLL Post-procedure Diagnosis: Same  Technically successful CT guided bone marrow aspiration and biopsy of left iliac crest.   Complications: None Immediate  EBL: None  Signed: Sandi Mariscal Pager: [phone] 04/10/2020, 9:59 AM

## 2020-04-10 NOTE — Discharge Instructions (Signed)
Please call Interventional Radiology clinic [phone] with any questions or concerns.  You may remove your dressing and shower tomorrow.   Bone Marrow Aspiration and Bone Marrow Biopsy, Adult, Care After This sheet gives you information about how to care for yourself after your procedure. Your health care provider may also give you more specific instructions. If you have problems or questions, contact your health care provider. What can I expect after the procedure? After the procedure, it is common to have:  Mild pain and tenderness.  Swelling.  Bruising. Follow these instructions at home: Puncture site care  Follow instructions from your health care provider about how to take care of the puncture site. Make sure you: ? Wash your hands with soap and water before and after you change your bandage (dressing). If soap and water are not available, use hand sanitizer. ? Change your dressing as told by your health care provider.  Check your puncture site every day for signs of infection. Check for: ? More redness, swelling, or pain. ? Fluid or blood. ? Warmth. ? Pus or a bad smell.   Activity  Return to your normal activities as told by your health care provider. Ask your health care provider what activities are safe for you.  Do not lift anything that is heavier than 10 lb (4.5 kg), or the limit that you are told, until your health care provider says that it is safe.  Do not drive for 24 hours if you were given a sedative during your procedure. General instructions  Take over-the-counter and prescription medicines only as told by your health care provider.  Do not take baths, swim, or use a hot tub until your health care provider approves. Ask your health care provider if you may take showers. You may only be allowed to take sponge baths.  If directed, put ice on the affected area. To do this: ? Put ice in a plastic bag. ? Place a towel between your skin and the bag. ? Leave  the ice on for 20 minutes, 2-3 times a day.  Keep all follow-up visits as told by your health care provider. This is important.   Contact a health care provider if:  Your pain is not controlled with medicine.  You have a fever.  You have more redness, swelling, or pain around the puncture site.  You have fluid or blood coming from the puncture site.  Your puncture site feels warm to the touch.  You have pus or a bad smell coming from the puncture site. Summary  After the procedure, it is common to have mild pain, tenderness, swelling, and bruising.  Follow instructions from your health care provider about how to take care of the puncture site and what activities are safe for you.  Take over-the-counter and prescription medicines only as told by your health care provider.  Contact a health care provider if you have any signs of infection, such as fluid or blood coming from the puncture site. This information is not intended to replace advice given to you by your health care provider. Make sure you discuss any questions you have with your health care provider. Document Revised: 06/01/2018 Document Reviewed: 06/01/2018 Elsevier Patient Education  2021 Elsevier Inc.   Moderate Conscious Sedation, Adult, Care After This sheet gives you information about how to care for yourself after your procedure. Your health care provider may also give you more specific instructions. If you have problems or questions, contact your health care provider. What   can I expect after the procedure? After the procedure, it is common to have:  Sleepiness for several hours.  Impaired judgment for several hours.  Difficulty with balance.  Vomiting if you eat too soon. Follow these instructions at home: For the time period you were told by your health care provider:  Rest.  Do not participate in activities where you could fall or become injured.  Do not drive or use machinery.  Do not drink  alcohol.  Do not take sleeping pills or medicines that cause drowsiness.  Do not make important decisions or sign legal documents.  Do not take care of children on your own.      Eating and drinking  Follow the diet recommended by your health care provider.  Drink enough fluid to keep your urine pale yellow.  If you vomit: ? Drink water, juice, or soup when you can drink without vomiting. ? Make sure you have little or no nausea before eating solid foods.   General instructions  Take over-the-counter and prescription medicines only as told by your health care provider.  Have a responsible adult stay with you for the time you are told. It is important to have someone help care for you until you are awake and alert.  Do not smoke.  Keep all follow-up visits as told by your health care provider. This is important. Contact a health care provider if:  You are still sleepy or having trouble with balance after 24 hours.  You feel light-headed.  You keep feeling nauseous or you keep vomiting.  You develop a rash.  You have a fever.  You have redness or swelling around the IV site. Get help right away if:  You have trouble breathing.  You have new-onset confusion at home. Summary  After the procedure, it is common to feel sleepy, have impaired judgment, or feel nauseous if you eat too soon.  Rest after you get home. Know the things you should not do after the procedure.  Follow the diet recommended by your health care provider and drink enough fluid to keep your urine pale yellow.  Get help right away if you have trouble breathing or new-onset confusion at home. This information is not intended to replace advice given to you by your health care provider. Make sure you discuss any questions you have with your health care provider. Document Revised: 05/13/2019 Document Reviewed: 12/09/2018 Elsevier Patient Education  2021 Elsevier Inc.  

## 2020-04-11 ENCOUNTER — Ambulatory Visit: Payer: Medicare PPO | Admitting: Nurse Practitioner

## 2020-04-11 ENCOUNTER — Other Ambulatory Visit: Payer: Self-pay | Admitting: Adult Health

## 2020-04-11 ENCOUNTER — Other Ambulatory Visit: Payer: Medicare PPO

## 2020-04-11 DIAGNOSIS — C9002 Multiple myeloma in relapse: Secondary | ICD-10-CM

## 2020-04-11 NOTE — Progress Notes (Signed)
I connected by phone with Mitchell Lowery on 04/11/2020, 9:45 AM to discuss the potential use of a new treatment, tixagevimab/cilgavimab, for pre-exposure prophylaxis for prevention of coronavirus disease 2019 (COVID-19) caused by the SARS-CoV-2 virus.  This patient is a 86 y.o. male that meets the FDA criteria for Emergency Use Authorization of tixagevimab/cilgavimab for pre-exposure prophylaxis of COVID-19 disease. Pt meets following criteria:  Age >12 yr and weight > 40kg  Not currently infected with SARS-CoV-2 and has no known recent exposure to an individual infected with SARS-CoV-2 AND o Who has moderate to severe immune compromise due to a medical condition or receipt of immunosuppressive medications or treatments and may not mount an adequate immune response to COVID-19 vaccination or  o Vaccination with any available COVID-19 vaccine, according to the approved or authorized schedule, is not recommended due to a history of severe adverse reaction (e.g., severe allergic reaction) to a COVID-19 vaccine(s) and/or COVID-19 vaccine component(s).  o Patient meets the following definition of mod-severe immune compromised status: 4. Lung transplants, other solid organ transplant recipients on continual belatacept therapy, or multiple myeloma (actively receiving treatment)  I have spoken and communicated the following to the patient or parent/caregiver regarding COVID monoclonal antibody treatment:  1. FDA has authorized the emergency use of tixagevimab/cilgavimab for the pre-exposure prophylaxis of COVID-19 in patients with moderate-severe immunocompromised status, who meet above EUA criteria.  2. The significant known and potential risks and benefits of COVID monoclonal antibody, and the extent to which such potential risks and benefits are unknown.  3. Information on available alternative treatments and the risks and benefits of those alternatives, including clinical trials.  4. The patient or  parent/caregiver has the option to accept or refuse COVID monoclonal antibody treatment.  After reviewing this information with the patient, agree to receive tixagevimab/cilgavimab pending discussion with [name] prior to his injection appointment.    Scot Dock, NP, 04/11/2020, 9:45 AM

## 2020-04-12 ENCOUNTER — Telehealth: Payer: Self-pay | Admitting: Oncology

## 2020-04-12 NOTE — Telephone Encounter (Signed)
Scheduled per 03/16 scheduled message, informed patient we can accommodate treatment on 03/18 instead we have scheduled it for 03/19. Left a voicemail.

## 2020-04-13 ENCOUNTER — Inpatient Hospital Stay: Payer: Medicare PPO

## 2020-04-13 ENCOUNTER — Inpatient Hospital Stay (HOSPITAL_BASED_OUTPATIENT_CLINIC_OR_DEPARTMENT_OTHER): Payer: Medicare PPO | Admitting: Oncology

## 2020-04-13 ENCOUNTER — Other Ambulatory Visit: Payer: Self-pay

## 2020-04-13 VITALS — BP 118/66 | HR 82 | Temp 98.0°F | Resp 18 | Ht 71.0 in | Wt 177.8 lb

## 2020-04-13 DIAGNOSIS — Z9221 Personal history of antineoplastic chemotherapy: Secondary | ICD-10-CM | POA: Diagnosis not present

## 2020-04-13 DIAGNOSIS — C9000 Multiple myeloma not having achieved remission: Secondary | ICD-10-CM

## 2020-04-13 DIAGNOSIS — C9002 Multiple myeloma in relapse: Secondary | ICD-10-CM

## 2020-04-13 DIAGNOSIS — Z923 Personal history of irradiation: Secondary | ICD-10-CM | POA: Diagnosis not present

## 2020-04-13 DIAGNOSIS — Z298 Encounter for other specified prophylactic measures: Secondary | ICD-10-CM | POA: Diagnosis not present

## 2020-04-13 LAB — CBC WITH DIFFERENTIAL (CANCER CENTER ONLY)
Abs Immature Granulocytes: 0 10*3/uL (ref 0.00–0.07)
Basophils Absolute: 0 10*3/uL (ref 0.0–0.1)
Basophils Relative: 1 %
Eosinophils Absolute: 0.1 10*3/uL (ref 0.0–0.5)
Eosinophils Relative: 2 %
HCT: 40.4 % (ref 39.0–52.0)
Hemoglobin: 13.1 g/dL (ref 13.0–17.0)
Immature Granulocytes: 0 %
Lymphocytes Relative: 21 %
Lymphs Abs: 0.7 10*3/uL (ref 0.7–4.0)
MCH: 28.7 pg (ref 26.0–34.0)
MCHC: 32.4 g/dL (ref 30.0–36.0)
MCV: 88.6 fL (ref 80.0–100.0)
Monocytes Absolute: 0.4 10*3/uL (ref 0.1–1.0)
Monocytes Relative: 14 %
Neutro Abs: 1.9 10*3/uL (ref 1.7–7.7)
Neutrophils Relative %: 62 %
Platelet Count: 57 10*3/uL — ABNORMAL LOW (ref 150–400)
RBC: 4.56 MIL/uL (ref 4.22–5.81)
RDW: 16.1 % — ABNORMAL HIGH (ref 11.5–15.5)
WBC Count: 3.1 10*3/uL — ABNORMAL LOW (ref 4.0–10.5)
nRBC: 0 % (ref 0.0–0.2)

## 2020-04-13 NOTE — Progress Notes (Signed)
[place] OFFICE PROGRESS NOTE   Diagnosis: Multiple myeloma  INTERVAL HISTORY:   Mitchell Lowery returns as scheduled.  He feels well.  No pain or bleeding.  He is walking for exercise. He underwent a bone marrow biopsy on 04/10/2020.  He reports tolerating the procedure well.  Chemotherapy was held on 03/28/2020 when the platelet count returned at 42,000.   Objective:  Vital signs in last 24 hours:  Blood pressure 118/66, pulse 82, temperature 98 F (36.7 C), temperature source Tympanic, resp. rate 18, height '5\' 11"'  (1.803 m), weight 177 lb 12.8 oz (80.6 kg), SpO2 97 %.    Resp: Lungs clear bilaterally Cardio: Regular rate and rhythm, 2/6 diastolic murmur GI: No hepatosplenomegaly Vascular: Trace lower leg edema bilaterally  Skin: Healed abrasion at the left lower lateral leg without remaining skin breakdown   Lab Results:  Lab Results  Component Value Date   WBC 3.1 (L) 04/13/2020   HGB 13.1 04/13/2020   HCT 40.4 04/13/2020   MCV 88.6 04/13/2020   PLT 57 (L) 04/13/2020   NEUTROABS 1.9 04/13/2020    CMP  Lab Results  Component Value Date   NA 141 03/28/2020   K 3.9 03/28/2020   CL 108 03/28/2020   CO2 25 03/28/2020   GLUCOSE 114 (H) 03/28/2020   BUN 9 03/28/2020   CREATININE 0.77 03/28/2020   CALCIUM 8.7 (L) 03/28/2020   PROT 5.6 (L) 03/28/2020   ALBUMIN 3.4 (L) 03/28/2020   AST 20 03/28/2020   ALT 16 03/28/2020   ALKPHOS 40 03/28/2020   BILITOT 0.5 03/28/2020   GFRNONAA >60 03/28/2020   GFRAA >60 10/26/2019    Imaging:  CT BONE MARROW BIOPSY & ASPIRATION  Result Date: 04/10/2020 INDICATION: Restaging for multiple myeloma thrombocytopenia. Please perform CT-guided bone marrow biopsy for tissue diagnostic. EXAM: CT-GUIDED BONE MARROW BIOPSY AND ASPIRATION MEDICATIONS: None ANESTHESIA/SEDATION: Fentanyl 100 mcg IV; Versed 2 mg IV Sedation Time: 10 Minutes; The patient was continuously monitored during the procedure by the interventional  radiology nurse under my direct supervision. COMPLICATIONS: None immediate. PROCEDURE: Informed consent was obtained from the patient following an explanation of the procedure, risks, benefits and alternatives. The patient understands, agrees and consents for the procedure. All questions were addressed. A time out was performed prior to the initiation of the procedure. The patient was positioned prone and non-contrast localization CT was performed of the pelvis to demonstrate the iliac marrow spaces. The operative site was prepped and draped in the usual sterile fashion. Under sterile conditions and local anesthesia, a 22 gauge spinal needle was utilized for procedural planning. Next, an 11 gauge coaxial bone biopsy needle was advanced into the left iliac marrow space. Needle position was confirmed with CT imaging. Initially, a bone marrow aspiration was performed. Next, a bone marrow biopsy was obtained with the 11 gauge outer bone marrow device. Samples were prepared with the cytotechnologist and deemed adequate. The needle was removed and superficial hemostasis was obtained with manual compression. A dressing was applied. The patient tolerated the procedure well without immediate post procedural complication. IMPRESSION: Successful CT guided left iliac bone marrow aspiration and core biopsy. Electronically Signed   By: Sandi Mariscal M.D.   On: 04/10/2020 11:21   CT BONE MARROW BIOPSY  Result Date: 04/10/2020 INDICATION: Restaging for multiple myeloma thrombocytopenia. Please perform CT-guided bone marrow biopsy for tissue diagnostic. EXAM: CT-GUIDED BONE MARROW BIOPSY AND ASPIRATION MEDICATIONS: None ANESTHESIA/SEDATION: Fentanyl 100 mcg IV; Versed 2 mg IV Sedation Time: 10  Minutes; The patient was continuously monitored during the procedure by the interventional radiology nurse under my direct supervision. COMPLICATIONS: None immediate. PROCEDURE: Informed consent was obtained from the patient following an  explanation of the procedure, risks, benefits and alternatives. The patient understands, agrees and consents for the procedure. All questions were addressed. A time out was performed prior to the initiation of the procedure. The patient was positioned prone and non-contrast localization CT was performed of the pelvis to demonstrate the iliac marrow spaces. The operative site was prepped and draped in the usual sterile fashion. Under sterile conditions and local anesthesia, a 22 gauge spinal needle was utilized for procedural planning. Next, an 11 gauge coaxial bone biopsy needle was advanced into the left iliac marrow space. Needle position was confirmed with CT imaging. Initially, a bone marrow aspiration was performed. Next, a bone marrow biopsy was obtained with the 11 gauge outer bone marrow device. Samples were prepared with the cytotechnologist and deemed adequate. The needle was removed and superficial hemostasis was obtained with manual compression. A dressing was applied. The patient tolerated the procedure well without immediate post procedural complication. IMPRESSION: Successful CT guided left iliac bone marrow aspiration and core biopsy. Electronically Signed   By: Sandi Mariscal M.D.   On: 04/10/2020 11:21    Medications: I have reviewed the patient's current medications.   Assessment/Plan: 1. Multiple myeloma-confirmed on a bone marrow biopsy 08/07/2014, IgA lambda  Serum M spike and increased serum free lambda light chains  Myeloma FISH panel negative for chromosome 4, 11, 12, 13, 14, and 17 abnormalities, cytogenetics with no metaphases  Bone survey 08/15/2014 with indeterminant lucent skull lesions  Cycle 1 RVD 08/22/2014  Cycle 2 RVD 09/19/2014  Cycle 3 RVD 10/17/2014  Cycle 4 RVD 11/14/2014 (Decadron reduced to 20 mg weekly, Revlimid 15 mg days 1 through 14, Velcade 3/4 weeks)  Cycle 5 RVD 12/12/2014  Serum M spike not detected 12/26/2014  Cycle 6 RVD  01/09/2015  Maintenance Revlimid, 10 mg daily, 02/05/2015 , discontinue 02/26/2015 secondary to leg edema and diarrhea  Revlimid resumed at a dose of 10 mg every other day beginning 03/13/2015  PET scan 67/34/1937-TK hypermetabolic bone lesions, few lucent lesions in the spine and pelvis  Revlimid discontinued January 2019  Enrollment on a clinical trial at [facility] with pomalidomide/Decadron+/- ixazomibbeginning 03/10/2017  Treatment discontinued February 2021 secondary to rising serum lambda light chains  PET 04/05/2019-hypermetabolic right axillary/chest wall nodes, right paravertebral mass, small right pleural effusion, retrocrural node, right lateral abdominal wall mass, L5 lesion mild compression deformity. Focus of hypermetabolism at the anterior left pelvic wall without a CT correlate. Hypermetabolic lytic lesion in the left T3 transverse process  Cycle 1 daratumumab/carfilzomib/Decadron 04/05/2019  Radiation to the right chest wall mass and lumbar spine beginning 04/25/2019  Cycle 2 daratumumab/carfilzomib/Decadron 05/04/2019 (carfilzomib dose escalated); day 8 held due to neutropenia, thrombocytopenia; day 15 05/18/2019 (carfilzomib dose reduced)  Daratumumab/carfilzomib/Decadron changed to every 2-week dosing beginning 05/25/2019  Daratumumab changed to a monthly schedule beginning 09/21/2019; carfilzomib every 2 weeks  Daratumumab held 11/15/2019 secondary to thrombocytopenia, carfilzomib continued at a reduced dose  CT chest 11/19/2019-T10 paraspinous mass enlarged compared to 09/13/2019, other bone lesions unchanged  Daratumumab/carfilzomib/Decadron 12/14/2019  Daratumumab/carfilzomib/Decadron 12/27/2019  Daratumumab/carfilzomib/Decadron 01/11/2020  PET 01/24/2020-general improvement in previously noted bone lesions with sclerosis, improvement in chest adenopathy, enlargement of right lower thoracic paraspinal/pleural mass and separate pleural tumor deposits on the right,  increased right and left pleural effusions, new small to moderate pericardial effusion,  prepatellar bursitis/edema on the right, inflammatory activity at the left lateral calf  Daratumumab/carfilzomib/Decadron 01/24/2020  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 History of intermittent prostatitis  4. Skin rash 10/24/2014-referred to dermatology, biopsy consistent with local hypersensitivity reaction  5. 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 Left lower leg cellulitis 06/24/2015-treated with Keflex  7. History of mild thrombocytopenia secondary tomyeloma and systemic therapy  8. History of mild neutropenia-secondary to myeloma and systemic therapy  9. Fall with a skull fracture and subarachnoid blood/right frontal lobe contusions 10/17/2015  10. Right lower lobe pulmonary embolus 03/28/2016. Lovenox , transitioned to Xarelto  11. Vesicular rashJune 2018-potentially related to the zoster vaccine, resolved  12. History of atrial fibrillation  13. Aortic stenosis-TAVR 10/25/2019  14. 07/02/2019-hospital admission for sepsis secondary to cellulitis of the left lower extremity with gram-negative rod bacteremia(Aeromonascaviae)  15. Pleuritic chest pain - onset 10/2019, cardiac w/u negative   16. Right shoulder pain, possible  bone spur on 04/2019 PET, likely MSK related (12/26/19)  17. Eczematous rash (12/26/19),  not felt to be drug rash/allergy. Recommended hydrocortisone topical PRN         Disposition: Mitchell Lowery appears stable.  The right chest discomfort resolved after the course of palliative radiation.  The restaging bone marrow biopsy reveals no evidence of myeloma.  The thrombocytopenia is most likely related to the extensive course of therapy.  Cytogenetics on the bone marrow biopsy are pending.  Mitchell Lowery will return next week for [place].  He will remain off of specific therapy for myeloma.  He will return for an office and lab visit in 3-4 weeks.  I will forward the bone marrow result to Dr. Amalia Hailey.  [name], MD  04/13/2020  10:49 AM

## 2020-04-14 ENCOUNTER — Other Ambulatory Visit: Payer: Self-pay

## 2020-04-14 ENCOUNTER — Inpatient Hospital Stay: Payer: Medicare PPO

## 2020-04-14 DIAGNOSIS — C9000 Multiple myeloma not having achieved remission: Secondary | ICD-10-CM | POA: Diagnosis not present

## 2020-04-14 DIAGNOSIS — Z298 Encounter for other specified prophylactic measures: Secondary | ICD-10-CM | POA: Diagnosis not present

## 2020-04-14 DIAGNOSIS — Z923 Personal history of irradiation: Secondary | ICD-10-CM | POA: Diagnosis not present

## 2020-04-14 DIAGNOSIS — Z9221 Personal history of antineoplastic chemotherapy: Secondary | ICD-10-CM | POA: Diagnosis not present

## 2020-04-14 MED ORDER — CILGAVIMAB (PART OF EVUSHELD) INJECTION
300.0000 mg | Freq: Once | INTRAMUSCULAR | Status: AC
  Administered 2020-04-14: 300 mg via INTRAMUSCULAR

## 2020-04-14 MED ORDER — TIXAGEVIMAB (PART OF EVUSHELD) INJECTION
300.0000 mg | Freq: Once | INTRAMUSCULAR | Status: AC
  Administered 2020-04-14: 300 mg via INTRAMUSCULAR

## 2020-04-18 DIAGNOSIS — D61818 Other pancytopenia: Secondary | ICD-10-CM | POA: Diagnosis not present

## 2020-04-18 DIAGNOSIS — E538 Deficiency of other specified B group vitamins: Secondary | ICD-10-CM | POA: Diagnosis not present

## 2020-04-18 DIAGNOSIS — D6869 Other thrombophilia: Secondary | ICD-10-CM | POA: Diagnosis not present

## 2020-04-18 DIAGNOSIS — I4891 Unspecified atrial fibrillation: Secondary | ICD-10-CM | POA: Diagnosis not present

## 2020-04-18 DIAGNOSIS — C9000 Multiple myeloma not having achieved remission: Secondary | ICD-10-CM | POA: Diagnosis not present

## 2020-04-18 DIAGNOSIS — D1739 Benign lipomatous neoplasm of skin and subcutaneous tissue of other sites: Secondary | ICD-10-CM | POA: Diagnosis not present

## 2020-04-18 DIAGNOSIS — Z0001 Encounter for general adult medical examination with abnormal findings: Secondary | ICD-10-CM | POA: Diagnosis not present

## 2020-04-18 DIAGNOSIS — I10 Essential (primary) hypertension: Secondary | ICD-10-CM | POA: Diagnosis not present

## 2020-04-18 DIAGNOSIS — K5900 Constipation, unspecified: Secondary | ICD-10-CM | POA: Diagnosis not present

## 2020-04-18 DIAGNOSIS — R600 Localized edema: Secondary | ICD-10-CM | POA: Diagnosis not present

## 2020-04-18 DIAGNOSIS — E559 Vitamin D deficiency, unspecified: Secondary | ICD-10-CM | POA: Diagnosis not present

## 2020-04-18 DIAGNOSIS — Z86711 Personal history of pulmonary embolism: Secondary | ICD-10-CM | POA: Diagnosis not present

## 2020-04-19 ENCOUNTER — Encounter (HOSPITAL_COMMUNITY): Payer: Self-pay | Admitting: Oncology

## 2020-04-20 DIAGNOSIS — R600 Localized edema: Secondary | ICD-10-CM | POA: Diagnosis not present

## 2020-04-23 ENCOUNTER — Encounter (HOSPITAL_COMMUNITY): Payer: Self-pay | Admitting: Oncology

## 2020-04-24 ENCOUNTER — Telehealth: Payer: Self-pay

## 2020-04-24 NOTE — Telephone Encounter (Signed)
I spoke to patient after consulting with [name] and offered him an appointment for tomorrow at 1:45 to arrive by 1:30.  He agreed to this appointment time.

## 2020-04-24 NOTE — Telephone Encounter (Signed)
Patient calls stating he has lab and follow up with [name] on 05/09/2020 however about 4 to 5 days started having increased pain in his bones.  Taking Tylenol which seems to help some.  He is wondering if he needs to be seen sooner.

## 2020-04-25 ENCOUNTER — Inpatient Hospital Stay (HOSPITAL_BASED_OUTPATIENT_CLINIC_OR_DEPARTMENT_OTHER): Payer: Medicare PPO | Admitting: Oncology

## 2020-04-25 ENCOUNTER — Other Ambulatory Visit: Payer: Self-pay

## 2020-04-25 VITALS — BP 143/80 | HR 73 | Temp 98.1°F | Resp 20 | Ht 71.0 in | Wt 181.6 lb

## 2020-04-25 DIAGNOSIS — Z298 Encounter for other specified prophylactic measures: Secondary | ICD-10-CM | POA: Diagnosis not present

## 2020-04-25 DIAGNOSIS — Z923 Personal history of irradiation: Secondary | ICD-10-CM | POA: Diagnosis not present

## 2020-04-25 DIAGNOSIS — Z9221 Personal history of antineoplastic chemotherapy: Secondary | ICD-10-CM | POA: Diagnosis not present

## 2020-04-25 DIAGNOSIS — C9000 Multiple myeloma not having achieved remission: Secondary | ICD-10-CM | POA: Diagnosis not present

## 2020-04-25 LAB — SURGICAL PATHOLOGY

## 2020-04-25 NOTE — Progress Notes (Signed)
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  He reports discomfort at the bilateral posterior lateral chest wall for the past week.  The pain is not present at rest.  He has the pain with certain movements.  He has been taking Tylenol.  No fever or dyspnea.  He is walking for exercise.  Objective:  Vital signs in last 24 hours:  Blood pressure (!) 143/80, pulse 73, temperature 98.1 F (36.7 C), temperature source Tympanic, resp. rate 20, height '5\' 11"'  (1.803 m), weight 181 lb 9.6 oz (82.4 kg), SpO2 98 %.    Resp: Decreased breath sounds with dullness at the right lower posterior chest, no respiratory distress Cardio: Regular rate and rhythm, 2/6 diastolic murmur GI: Nontender, no hepatosplenomegaly Vascular: Trace edema at the left greater than right lower leg Musculoskeletal: No tenderness at the spine or posterior chest wall, no mass    Lab Results:  Lab Results  Component Value Date   WBC 3.1 (L) 04/13/2020   HGB 13.1 04/13/2020   HCT 40.4 04/13/2020   MCV 88.6 04/13/2020   PLT 57 (L) 04/13/2020   NEUTROABS 1.9 04/13/2020    CMP  Lab Results  Component Value Date   NA 141 03/28/2020   K 3.9 03/28/2020   CL 108 03/28/2020   CO2 25 03/28/2020   GLUCOSE 114 (H) 03/28/2020   BUN 9 03/28/2020   CREATININE 0.77 03/28/2020   CALCIUM 8.7 (L) 03/28/2020   PROT 5.6 (L) 03/28/2020   ALBUMIN 3.4 (L) 03/28/2020   AST 20 03/28/2020   ALT 16 03/28/2020   ALKPHOS 40 03/28/2020   BILITOT 0.5 03/28/2020   GFRNONAA >60 03/28/2020   GFRAA >60 10/26/2019    No results found for: CEA1  Lab Results  Component Value Date   INR 1.0 02/20/2020    Imaging:  No results found.  Medications: I have reviewed the patient's current medications.   Assessment/Plan: 1. Multiple myeloma-confirmed on a bone marrow biopsy 08/07/2014, IgA lambda  Serum M spike and increased  serum free lambda light chains  Myeloma FISH panel negative for chromosome 4, 11, 12, 13, 14, and 17 abnormalities, cytogenetics with no metaphases  Bone survey 08/15/2014 with indeterminant lucent skull lesions  Cycle 1 RVD 08/22/2014  Cycle 2 RVD 09/19/2014  Cycle 3 RVD 10/17/2014  Cycle 4 RVD 11/14/2014 (Decadron reduced to 20 mg weekly, Revlimid 15 mg days 1 through 14, Velcade 3/4 weeks)  Cycle 5 RVD 12/12/2014  Serum M spike not detected 12/26/2014  Cycle 6 RVD 01/09/2015  Maintenance Revlimid, 10 mg daily, 02/05/2015 , discontinue 02/26/2015 secondary to leg edema and diarrhea  Revlimid resumed at a dose of 10 mg every other day beginning 03/13/2015  PET scan 32/99/2426-ST hypermetabolic bone lesions, few lucent lesions in the spine and pelvis  Revlimid discontinued January 2019  Enrollment on a clinical trial at [facility] with pomalidomide/Decadron+/- ixazomibbeginning 03/10/2017  Treatment discontinued February 2021 secondary to rising serum lambda light chains  PET 04/05/2019-hypermetabolic right axillary/chest wall nodes, right paravertebral mass, small right pleural effusion, retrocrural node, right lateral abdominal wall mass, L5 lesion mild compression deformity. Focus of hypermetabolism at the anterior left pelvic wall without a CT correlate. Hypermetabolic lytic lesion in the left T3 transverse process  Cycle 1 daratumumab/carfilzomib/Decadron 04/05/2019  Radiation to the right chest wall mass and lumbar spine beginning 04/25/2019  Cycle 2 daratumumab/carfilzomib/Decadron 05/04/2019 (carfilzomib dose escalated); day 8  held due to neutropenia, thrombocytopenia; day 15 05/18/2019 (carfilzomib dose reduced)  Daratumumab/carfilzomib/Decadron changed to every 2-week dosing beginning 05/25/2019  Daratumumab changed to a monthly schedule beginning 09/21/2019; carfilzomib every 2 weeks  Daratumumab held 11/15/2019 secondary to thrombocytopenia, carfilzomib continued at a  reduced dose  CT chest 11/19/2019-T10 paraspinous mass enlarged compared to 09/13/2019, other bone lesions unchanged  Daratumumab/carfilzomib/Decadron 12/14/2019  Daratumumab/carfilzomib/Decadron 12/27/2019  Daratumumab/carfilzomib/Decadron 01/11/2020  PET 01/24/2020-general improvement in previously noted bone lesions with sclerosis, improvement in chest adenopathy, enlargement of right lower thoracic paraspinal/pleural mass and separate pleural tumor deposits on the right, increased right and left pleural effusions, new small to moderate pericardial effusion, prepatellar bursitis/edema on the right, inflammatory activity at the left lateral calf  Daratumumab/carfilzomib/Decadron 01/24/2020  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 History of intermittent prostatitis  4. Skin rash 10/24/2014-referred to dermatology, biopsy consistent with local hypersensitivity reaction  5. 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 Left lower leg cellulitis 06/24/2015-treated with Keflex  7. History of mild thrombocytopenia secondary tomyeloma and systemic therapy  8. History of mild neutropenia-secondary to myeloma and systemic therapy  9. Fall with a skull fracture and subarachnoid blood/right frontal  lobe contusions 10/17/2015  10. Right lower lobe pulmonary embolus 03/28/2016. Lovenox , transitioned to Xarelto  11. Vesicular rashJune 2018-potentially related to the zoster vaccine, resolved  12. History of atrial fibrillation  13. Aortic stenosis-TAVR 10/25/2019  14. 07/02/2019-hospital admission for sepsis secondary to cellulitis of the left lower extremity with gram-negative rod bacteremia(Aeromonascaviae)  15. Pleuritic chest pain - onset 10/2019, cardiac w/u negative   16. Right shoulder pain, possible bone spur on 04/2019 PET, likely MSK related (12/26/19)  17. Eczematous rash (12/26/19),  not felt to be drug rash/allergy. 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  He is currently maintained off of systemic therapy.  A bone marrow biopsy on 04/10/2020 revealed no evidence of myeloma with normal cytogenetics.  He presents today with discomfort at the bilateral posterior lateral chest.  The pain is present with movement and deep inspiration.  It is possible the pain is related to a benign musculoskeletal condition or myeloma involving the pleura.  He was noted to have hypermetabolic pleural lesions on the PET scan in December.  Back pain improved following palliative radiation to a paraspinous mass.  He will continue Tylenol as needed for pain.  He also has tramadol and hydrocodone to use as needed.  He will call for increased pain or new symptoms.  We will refer him for a restaging PET scan if the pain persist.  I discussed the case with Dr. Amalia Hailey last week.  He is in agreement with observation for now.  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  [name], MD  04/25/2020  2:06 PM

## 2020-04-27 MED FILL — XARELTO 20 MG TABLET: 20 | 30 days supply | Qty: 30 | Fill #1

## 2020-05-09 ENCOUNTER — Inpatient Hospital Stay: Payer: Medicare PPO | Attending: Oncology | Admitting: Oncology

## 2020-05-09 ENCOUNTER — Other Ambulatory Visit: Payer: Self-pay

## 2020-05-09 ENCOUNTER — Inpatient Hospital Stay: Payer: Medicare PPO

## 2020-05-09 VITALS — BP 132/72 | HR 82 | Temp 97.8°F | Resp 20 | Ht 71.0 in | Wt 177.2 lb

## 2020-05-09 DIAGNOSIS — K5900 Constipation, unspecified: Secondary | ICD-10-CM | POA: Diagnosis not present

## 2020-05-09 DIAGNOSIS — Z298 Encounter for other specified prophylactic measures: Secondary | ICD-10-CM | POA: Insufficient documentation

## 2020-05-09 DIAGNOSIS — Z923 Personal history of irradiation: Secondary | ICD-10-CM | POA: Diagnosis not present

## 2020-05-09 DIAGNOSIS — R609 Edema, unspecified: Secondary | ICD-10-CM | POA: Diagnosis not present

## 2020-05-09 DIAGNOSIS — R109 Unspecified abdominal pain: Secondary | ICD-10-CM | POA: Diagnosis not present

## 2020-05-09 DIAGNOSIS — C9000 Multiple myeloma not having achieved remission: Secondary | ICD-10-CM | POA: Insufficient documentation

## 2020-05-09 DIAGNOSIS — I2699 Other pulmonary embolism without acute cor pulmonale: Secondary | ICD-10-CM | POA: Diagnosis not present

## 2020-05-09 DIAGNOSIS — Z7901 Long term (current) use of anticoagulants: Secondary | ICD-10-CM | POA: Insufficient documentation

## 2020-05-09 DIAGNOSIS — C9002 Multiple myeloma in relapse: Secondary | ICD-10-CM

## 2020-05-09 DIAGNOSIS — I4891 Unspecified atrial fibrillation: Secondary | ICD-10-CM | POA: Diagnosis not present

## 2020-05-09 DIAGNOSIS — Z9221 Personal history of antineoplastic chemotherapy: Secondary | ICD-10-CM | POA: Diagnosis not present

## 2020-05-09 LAB — CMP (CANCER CENTER ONLY)
ALT: 12 U/L (ref 0–44)
AST: 19 U/L (ref 15–41)
Albumin: 3.8 g/dL (ref 3.5–5.0)
Alkaline Phosphatase: 44 U/L (ref 38–126)
Anion gap: 7 (ref 5–15)
BUN: 12 mg/dL (ref 8–23)
CO2: 29 mmol/L (ref 22–32)
Calcium: 9.1 mg/dL (ref 8.9–10.3)
Chloride: 106 mmol/L (ref 98–111)
Creatinine: 0.78 mg/dL (ref 0.61–1.24)
GFR, Estimated: >60 mL/min (ref >60)
Glucose, Bld: 71 mg/dL (ref 70–99)
Potassium: 3.8 mmol/L (ref 3.5–5.1)
Sodium: 142 mmol/L (ref 135–145)
Total Bilirubin: 0.5 mg/dL (ref 0.3–1.2)
Total Protein: 5.8 g/dL — ABNORMAL LOW (ref 6.5–8.1)

## 2020-05-09 LAB — CBC WITH DIFFERENTIAL (CANCER CENTER ONLY)
Abs Immature Granulocytes: 0.01 10*3/uL (ref 0.00–0.07)
Basophils Absolute: 0 10*3/uL (ref 0.0–0.1)
Basophils Relative: 1 %
Eosinophils Absolute: 0.2 10*3/uL (ref 0.0–0.5)
Eosinophils Relative: 5 %
HCT: 39.9 % (ref 39.0–52.0)
Hemoglobin: 12.8 g/dL — ABNORMAL LOW (ref 13.0–17.0)
Immature Granulocytes: 0 %
Lymphocytes Relative: 21 %
Lymphs Abs: 0.7 10*3/uL (ref 0.7–4.0)
MCH: 29.2 pg (ref 26.0–34.0)
MCHC: 32.1 g/dL (ref 30.0–36.0)
MCV: 90.9 fL (ref 80.0–100.0)
Monocytes Absolute: 0.5 10*3/uL (ref 0.1–1.0)
Monocytes Relative: 15 %
Neutro Abs: 2.1 10*3/uL (ref 1.7–7.7)
Neutrophils Relative %: 58 %
Platelet Count: 71 10*3/uL — ABNORMAL LOW (ref 150–400)
RBC: 4.39 MIL/uL (ref 4.22–5.81)
RDW: 14.5 % (ref 11.5–15.5)
WBC Count: 3.6 10*3/uL — ABNORMAL LOW (ref 4.0–10.5)
nRBC: 0 % (ref 0.0–0.2)
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
## 2020-05-18 LAB — KAPPA/LAMBDA LIGHT CHAINS
Kappa free light chain: 3.7 mg/L (ref 3.3–19.4)
Kappa, lambda light chain ratio: 0.04 — ABNORMAL LOW (ref 0.26–1.65)
Lambda free light chains: 100.6 mg/L — ABNORMAL HIGH (ref 5.7–26.3)

## 2020-05-18 LAB — IGA: IgA: 34 mg/dL — ABNORMAL LOW (ref 61–437)

## 2020-05-21 DIAGNOSIS — D649 Anemia, unspecified: Secondary | ICD-10-CM | POA: Diagnosis not present

## 2020-05-21 DIAGNOSIS — I10 Essential (primary) hypertension: Secondary | ICD-10-CM | POA: Diagnosis not present

## 2020-05-21 DIAGNOSIS — C61 Malignant neoplasm of prostate: Secondary | ICD-10-CM | POA: Diagnosis not present

## 2020-05-21 DIAGNOSIS — I4891 Unspecified atrial fibrillation: Secondary | ICD-10-CM | POA: Diagnosis not present

## 2020-05-21 DIAGNOSIS — N400 Enlarged prostate without lower urinary tract symptoms: Secondary | ICD-10-CM | POA: Diagnosis not present

## 2020-05-21 DIAGNOSIS — D61818 Other pancytopenia: Secondary | ICD-10-CM | POA: Diagnosis not present

## 2020-05-31 ENCOUNTER — Other Ambulatory Visit (HOSPITAL_COMMUNITY): Payer: Self-pay

## 2020-05-31 MED FILL — Rivaroxaban Tab 20 MG: ORAL | 30 days supply | Qty: 30 | Fill #0 | Status: AC

## 2020-06-04 ENCOUNTER — Other Ambulatory Visit: Payer: Self-pay

## 2020-06-04 ENCOUNTER — Encounter: Payer: Self-pay | Admitting: Cardiovascular Disease

## 2020-06-04 ENCOUNTER — Ambulatory Visit (INDEPENDENT_AMBULATORY_CARE_PROVIDER_SITE_OTHER): Payer: Medicare PPO | Admitting: Cardiovascular Disease

## 2020-06-04 VITALS — BP 112/60 | HR 72 | Ht 71.0 in | Wt 176.4 lb

## 2020-06-04 DIAGNOSIS — Z952 Presence of prosthetic heart valve: Secondary | ICD-10-CM | POA: Diagnosis not present

## 2020-06-04 DIAGNOSIS — I25119 Atherosclerotic heart disease of native coronary artery with unspecified angina pectoris: Secondary | ICD-10-CM

## 2020-06-04 DIAGNOSIS — I480 Paroxysmal atrial fibrillation: Secondary | ICD-10-CM | POA: Diagnosis not present

## 2020-06-04 NOTE — Patient Instructions (Addendum)
Medication Instructions:  Your provider recommends that you continue on your current medications as directed. Please refer to the Current Medication list given to you today.   *If you need a refill on your cardiac medications before your next appointment, please call your pharmacy*   Follow-Up: You are due for your 1 year TAVR visits in September, 2022.
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
## 2020-06-06 ENCOUNTER — Inpatient Hospital Stay: Payer: Medicare PPO | Attending: Oncology | Admitting: Oncology

## 2020-06-06 ENCOUNTER — Other Ambulatory Visit (HOSPITAL_BASED_OUTPATIENT_CLINIC_OR_DEPARTMENT_OTHER): Payer: Self-pay

## 2020-06-06 ENCOUNTER — Other Ambulatory Visit: Payer: Self-pay

## 2020-06-06 ENCOUNTER — Ambulatory Visit: Payer: Medicare PPO | Attending: Internal Medicine

## 2020-06-06 ENCOUNTER — Inpatient Hospital Stay: Payer: Medicare PPO

## 2020-06-06 VITALS — BP 134/63 | HR 85 | Temp 98.1°F | Resp 20 | Ht 71.0 in | Wt 175.6 lb

## 2020-06-06 DIAGNOSIS — C9002 Multiple myeloma in relapse: Secondary | ICD-10-CM

## 2020-06-06 DIAGNOSIS — C9000 Multiple myeloma not having achieved remission: Secondary | ICD-10-CM | POA: Diagnosis not present

## 2020-06-06 DIAGNOSIS — Z923 Personal history of irradiation: Secondary | ICD-10-CM | POA: Insufficient documentation

## 2020-06-06 DIAGNOSIS — Z23 Encounter for immunization: Secondary | ICD-10-CM

## 2020-06-06 LAB — CBC WITH DIFFERENTIAL (CANCER CENTER ONLY)
Abs Immature Granulocytes: 0.01 10*3/uL (ref 0.00–0.07)
Basophils Absolute: 0 10*3/uL (ref 0.0–0.1)
Basophils Relative: 1 %
Eosinophils Absolute: 0.1 10*3/uL (ref 0.0–0.5)
Eosinophils Relative: 2 %
HCT: 42.4 % (ref 39.0–52.0)
Hemoglobin: 13.5 g/dL (ref 13.0–17.0)
Immature Granulocytes: 0 %
Lymphocytes Relative: 19 %
Lymphs Abs: 0.7 10*3/uL (ref 0.7–4.0)
MCH: 29.1 pg (ref 26.0–34.0)
MCHC: 31.8 g/dL (ref 30.0–36.0)
MCV: 91.4 fL (ref 80.0–100.0)
Monocytes Absolute: 0.5 10*3/uL (ref 0.1–1.0)
Monocytes Relative: 14 %
Neutro Abs: 2.5 10*3/uL (ref 1.7–7.7)
Neutrophils Relative %: 64 %
Platelet Count: 60 10*3/uL — ABNORMAL LOW (ref 150–400)
RBC: 4.64 MIL/uL (ref 4.22–5.81)
RDW: 13.9 % (ref 11.5–15.5)
WBC Count: 3.9 10*3/uL — ABNORMAL LOW (ref 4.0–10.5)
nRBC: 0 % (ref 0.0–0.2)

## 2020-06-06 LAB — CMP (CANCER CENTER ONLY)
ALT: 10 U/L (ref 0–44)
AST: 16 U/L (ref 15–41)
Albumin: 3.8 g/dL (ref 3.5–5.0)
Alkaline Phosphatase: 42 U/L (ref 38–126)
Anion gap: 7 (ref 5–15)
BUN: 12 mg/dL (ref 8–23)
CO2: 29 mmol/L (ref 22–32)
Calcium: 8.8 mg/dL — ABNORMAL LOW (ref 8.9–10.3)
Chloride: 106 mmol/L (ref 98–111)
Creatinine: 0.79 mg/dL (ref 0.61–1.24)
GFR, Estimated: >60 mL/min (ref >60)
Glucose, Bld: 69 mg/dL — ABNORMAL LOW (ref 70–99)
Potassium: 3.9 mmol/L (ref 3.5–5.1)
Sodium: 142 mmol/L (ref 135–145)
Total Bilirubin: 0.7 mg/dL (ref 0.3–1.2)
Total Protein: 6 g/dL — ABNORMAL LOW (ref 6.5–8.1)

## 2020-06-06 MED ORDER — PFIZER-BIONT COVID-19 VAC-TRIS 30 MCG/0.3ML IM SUSP
INTRAMUSCULAR | 0 refills | Status: DC
  Filled 2020-06-06: qty 0.3, 1d supply, fill #0

## 2020-06-06 NOTE — Progress Notes (Signed)
   Covid-19 Vaccination Clinic  Name:  Mitchell Lowery    MRN: [id number] DOB: 01-23-1935  06/06/2020  Mr. Dietrick was observed post Covid-19 immunization for 15 minutes without incident. He was provided with Vaccine Information Sheet and instruction to access the V-Safe system.   Mr. Saiki was instructed to call 911 with any severe reactions post vaccine: Marland Kitchen Difficulty breathing  . Swelling of face and throat  . A fast heartbeat  . A bad rash all over body  . Dizziness and weakness   Immunizations Administered    Name Date Dose VIS Date Route   PFIZER Comrnaty(Gray TOP) Covid-19 Vaccine 06/06/2020 10:29 AM 0.3 mL 01/05/2020 Intramuscular   Manufacturer: [place]   Lot: [id number]   NDC: [phone]

## 2020-06-06 NOTE — Progress Notes (Signed)
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  He has mild discomfort at the right lower anterolateral chest.  He takes Tylenol and ibuprofen approximately once daily for relief of pain.  No severe pain.  He is exercising.  No bleeding.  Objective:  Vital signs in last 24 hours:  Blood pressure 134/63, pulse 85, temperature 98.1 F (36.7 C), temperature source Oral, resp. rate 20, height '5\' 11"'  (1.803 m), weight 175 lb 9.6 oz (79.7 kg), SpO2 97 %.    Resp: Decreased breath sounds with end inspiratory rhonchi at the right posterior base, no respiratory distress Cardio: Regular rate and rhythm GI: No hepatosplenomegaly, nontender Vascular: Trace edema at the left greater than right lower leg Musculoskeletal: No tenderness or mass at the right chest wall Skin: Scab at the left lower leg skin lesion  Portacath/PICC-without erythema  Lab Results:  Lab Results  Component Value Date   WBC 3.9 (L) 06/06/2020   HGB 13.5 06/06/2020   HCT 42.4 06/06/2020   MCV 91.4 06/06/2020   PLT 60 (L) 06/06/2020   NEUTROABS 2.5 06/06/2020    CMP  Lab Results  Component Value Date   NA 142 05/09/2020   K 3.8 05/09/2020   CL 106 05/09/2020   CO2 29 05/09/2020   GLUCOSE 71 05/09/2020   BUN 12 05/09/2020   CREATININE 0.78 05/09/2020   CALCIUM 9.1 05/09/2020   PROT 5.8 (L) 05/09/2020   ALBUMIN 3.8 05/09/2020   AST 19 05/09/2020   ALT 12 05/09/2020   ALKPHOS 44 05/09/2020   BILITOT 0.5 05/09/2020   GFRNONAA >60 05/09/2020   GFRAA >60 10/26/2019    Medications: I have reviewed the patient's current medications.   Assessment/Plan: 1. Multiple myeloma-confirmed on a bone marrow biopsy 08/07/2014, IgA lambda  Serum M spike and increased serum free lambda light chains  Myeloma FISH panel negative for chromosome 4, 11, 12, 13, 14, and 17 abnormalities, cytogenetics with no metaphases  Bone survey  08/15/2014 with indeterminant lucent skull lesions  Cycle 1 RVD 08/22/2014  Cycle 2 RVD 09/19/2014  Cycle 3 RVD 10/17/2014  Cycle 4 RVD 11/14/2014 (Decadron reduced to 20 mg weekly, Revlimid 15 mg days 1 through 14, Velcade 3/4 weeks)  Cycle 5 RVD 12/12/2014  Serum M spike not detected 12/26/2014  Cycle 6 RVD 01/09/2015  Maintenance Revlimid, 10 mg daily, 02/05/2015 , discontinue 02/26/2015 secondary to leg edema and diarrhea  Revlimid resumed at a dose of 10 mg every other day beginning 03/13/2015  PET scan 85/27/7824-MP hypermetabolic bone lesions, few lucent lesions in the spine and pelvis  Revlimid discontinued January 2019  Enrollment on a clinical trial at [facility] with pomalidomide/Decadron+/- ixazomibbeginning 03/10/2017  Treatment discontinued February 2021 secondary to rising serum lambda light chains  PET 04/05/2019-hypermetabolic right axillary/chest wall nodes, right paravertebral mass, small right pleural effusion, retrocrural node, right lateral abdominal wall mass, L5 lesion mild compression deformity. Focus of hypermetabolism at the anterior left pelvic wall without a CT correlate. Hypermetabolic lytic lesion in the left T3 transverse process  Cycle 1 daratumumab/carfilzomib/Decadron 04/05/2019  Radiation to the right chest wall mass and lumbar spine beginning 04/25/2019  Cycle 2 daratumumab/carfilzomib/Decadron 05/04/2019 (carfilzomib dose escalated); day 8 held due to neutropenia, thrombocytopenia; day 15 05/18/2019 (carfilzomib dose reduced)  Daratumumab/carfilzomib/Decadron changed to every 2-week dosing beginning 05/25/2019  Daratumumab changed to a monthly schedule beginning 09/21/2019; carfilzomib every 2 weeks  Daratumumab held 11/15/2019 secondary to  thrombocytopenia, carfilzomib continued at a reduced dose  CT chest 11/19/2019-T10 paraspinous mass enlarged compared to 09/13/2019, other bone lesions unchanged  Daratumumab/carfilzomib/Decadron  12/14/2019  Daratumumab/carfilzomib/Decadron 12/27/2019  Daratumumab/carfilzomib/Decadron 01/11/2020  PET 01/24/2020-general improvement in previously noted bone lesions with sclerosis, improvement in chest adenopathy, enlargement of right lower thoracic paraspinal/pleural mass and separate pleural tumor deposits on the right, increased right and left pleural effusions, new small to moderate pericardial effusion, prepatellar bursitis/edema on the right, inflammatory activity at the left lateral calf  Daratumumab/carfilzomib/Decadron 01/24/2020  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 History of intermittent prostatitis  4. Skin rash 10/24/2014-referred to dermatology, biopsy consistent with local hypersensitivity reaction  5. 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 Left lower leg cellulitis 06/24/2015-treated with Keflex  7. History of mild thrombocytopenia secondary tomyeloma and systemic therapy  8. History of mild neutropenia-secondary to myeloma and systemic therapy  9. Fall with a skull fracture and subarachnoid blood/right frontal lobe contusions 10/17/2015  10. Right lower lobe pulmonary embolus 03/28/2016. Lovenox , transitioned to Xarelto  11. Vesicular rashJune  2018-potentially related to the zoster vaccine, resolved  12. History of atrial fibrillation  13. Aortic stenosis-TAVR 10/25/2019  14. 07/02/2019-hospital admission for sepsis secondary to cellulitis of the left lower extremity with gram-negative rod bacteremia(Aeromonascaviae)  15. Pleuritic chest pain - onset 10/2019, cardiac w/u negative   16. Right shoulder pain, possible bone spur on 04/2019 PET, likely MSK related (12/26/19)  17. Eczematous rash (12/26/19),  not felt to be drug rash/allergy. 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  The light chains were slightly higher when he was here last month.  He has persistent thrombocytopenia.  The right chest discomfort is likely related to pleural-based tumor in the right chest.  The plan is to continue observation for now.  We will follow up on the myeloma panel from today.  He is scheduled to see Dr. Amalia Hailey in 2 weeks.  He will return for an office visit in 3 weeks.  He will limit ibuprofen use to once daily. He received Evusheld in March.  He will receive a second COVID-19 booster today. [name], MD  06/06/2020  9:50 AM

## 2020-06-07 LAB — KAPPA/LAMBDA LIGHT CHAINS
Kappa free light chain: 3.7 mg/L (ref 3.3–19.4)
Kappa, lambda light chain ratio: 0.02 — ABNORMAL LOW (ref 0.26–1.65)
Lambda free light chains: 154.4 mg/L — ABNORMAL HIGH (ref 5.7–26.3)

## 2020-06-07 LAB — IGA: IgA: 44 mg/dL — ABNORMAL LOW (ref 61–437)

## 2020-06-19 DIAGNOSIS — D801 Nonfamilial hypogammaglobulinemia: Secondary | ICD-10-CM | POA: Diagnosis not present

## 2020-06-19 DIAGNOSIS — C9002 Multiple myeloma in relapse: Secondary | ICD-10-CM | POA: Diagnosis not present

## 2020-06-19 DIAGNOSIS — T451X5A Adverse effect of antineoplastic and immunosuppressive drugs, initial encounter: Secondary | ICD-10-CM | POA: Diagnosis not present

## 2020-06-19 DIAGNOSIS — D61810 Antineoplastic chemotherapy induced pancytopenia: Secondary | ICD-10-CM | POA: Diagnosis not present

## 2020-06-19 DIAGNOSIS — G620 Drug-induced polyneuropathy: Secondary | ICD-10-CM | POA: Diagnosis not present

## 2020-06-27 ENCOUNTER — Other Ambulatory Visit: Payer: Self-pay

## 2020-06-27 ENCOUNTER — Inpatient Hospital Stay: Payer: Medicare PPO | Attending: Oncology | Admitting: Oncology

## 2020-06-27 ENCOUNTER — Inpatient Hospital Stay: Payer: Medicare PPO

## 2020-06-27 VITALS — BP 116/68 | HR 83 | Temp 97.7°F | Resp 18 | Wt 175.8 lb

## 2020-06-27 DIAGNOSIS — Z923 Personal history of irradiation: Secondary | ICD-10-CM | POA: Insufficient documentation

## 2020-06-27 DIAGNOSIS — C9000 Multiple myeloma not having achieved remission: Secondary | ICD-10-CM | POA: Insufficient documentation

## 2020-06-27 DIAGNOSIS — C9002 Multiple myeloma in relapse: Secondary | ICD-10-CM

## 2020-06-27 LAB — CMP (CANCER CENTER ONLY)
ALT: 12 U/L (ref 0–44)
AST: 16 U/L (ref 15–41)
Albumin: 3.7 g/dL (ref 3.5–5.0)
Alkaline Phosphatase: 39 U/L (ref 38–126)
Anion gap: 8 (ref 5–15)
BUN: 15 mg/dL (ref 8–23)
CO2: 29 mmol/L (ref 22–32)
Calcium: 8.8 mg/dL — ABNORMAL LOW (ref 8.9–10.3)
Chloride: 103 mmol/L (ref 98–111)
Creatinine: 0.74 mg/dL (ref 0.61–1.24)
GFR, Estimated: >60 mL/min (ref >60)
Glucose, Bld: 69 mg/dL — ABNORMAL LOW (ref 70–99)
Potassium: 4.1 mmol/L (ref 3.5–5.1)
Sodium: 140 mmol/L (ref 135–145)
Total Bilirubin: 0.5 mg/dL (ref 0.3–1.2)
Total Protein: 5.5 g/dL — ABNORMAL LOW (ref 6.5–8.1)

## 2020-06-27 LAB — CBC WITH DIFFERENTIAL (CANCER CENTER ONLY)
Abs Immature Granulocytes: 0 10*3/uL (ref 0.00–0.07)
Basophils Absolute: 0 10*3/uL (ref 0.0–0.1)
Basophils Relative: 1 %
Eosinophils Absolute: 0.1 10*3/uL (ref 0.0–0.5)
Eosinophils Relative: 2 %
HCT: 40.9 % (ref 39.0–52.0)
Hemoglobin: 13.1 g/dL (ref 13.0–17.0)
Immature Granulocytes: 0 %
Lymphocytes Relative: 16 %
Lymphs Abs: 0.7 10*3/uL (ref 0.7–4.0)
MCH: 29 pg (ref 26.0–34.0)
MCHC: 32 g/dL (ref 30.0–36.0)
MCV: 90.5 fL (ref 80.0–100.0)
Monocytes Absolute: 0.5 10*3/uL (ref 0.1–1.0)
Monocytes Relative: 12 %
Neutro Abs: 3 10*3/uL (ref 1.7–7.7)
Neutrophils Relative %: 69 %
Platelet Count: 76 10*3/uL — ABNORMAL LOW (ref 150–400)
RBC: 4.52 MIL/uL (ref 4.22–5.81)
RDW: 13.9 % (ref 11.5–15.5)
WBC Count: 4.3 10*3/uL (ref 4.0–10.5)
nRBC: 0 % (ref 0.0–0.2)

## 2020-06-27 NOTE — Progress Notes (Signed)
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  He has increased discomfort at the right lateral chest wall and upper anterior chest.  He is concerned the anterior chest pain could be related to his heart.  No associated symptoms.  The chest pain is not associated with exertion.  He is taking one half of a hydrocodone tablet several times per day for relief of pain.  No other complaint. He saw Dr. Amalia Hailey on 06/19/2020 for a telehealth visit.  Dr. Amalia Hailey recommends chest imaging.  If there is clear evidence of disease progression he recommends CAR-T versus a by specific antibody trial.  Objective:  Vital signs in last 24 hours:  Blood pressure 116/68, pulse 83, temperature 97.7 F (36.5 C), temperature source Temporal, resp. rate 18, weight 175 lb 12.8 oz (79.7 kg), SpO2 97 %.    Resp: Decreased breath sounds at the right lower posterior chest, no respiratory distress Cardio: Regular rate and rhythm, 2/6 diastolic murmur GI: No hepatosplenomegaly Vascular: Trace edema of the left lower leg Musculoskeletal: No tenderness at the right chest wall or anterior chest, no mass      Lab Results:  Lab Results  Component Value Date   WBC 4.3 06/27/2020   HGB 13.1 06/27/2020   HCT 40.9 06/27/2020   MCV 90.5 06/27/2020   PLT 76 (L) 06/27/2020   NEUTROABS 3.0 06/27/2020    CMP  Lab Results  Component Value Date   NA 140 06/27/2020   K 4.1 06/27/2020   CL 103 06/27/2020   CO2 29 06/27/2020   GLUCOSE 69 (L) 06/27/2020   BUN 15 06/27/2020   CREATININE 0.74 06/27/2020   CALCIUM 8.8 (L) 06/27/2020   PROT 5.5 (L) 06/27/2020   ALBUMIN 3.7 06/27/2020   AST 16 06/27/2020   ALT 12 06/27/2020   ALKPHOS 39 06/27/2020   BILITOT 0.5 06/27/2020   GFRNONAA >60 06/27/2020   GFRAA >60 10/26/2019     Medications: I have reviewed the patient's current  medications.   Assessment/Plan: 1. Multiple myeloma-confirmed on a bone marrow biopsy 08/07/2014, IgA lambda  Serum M spike and increased serum free lambda light chains  Myeloma FISH panel negative for chromosome 4, 11, 12, 13, 14, and 17 abnormalities, cytogenetics with no metaphases  Bone survey 08/15/2014 with indeterminant lucent skull lesions  Cycle 1 RVD 08/22/2014  Cycle 2 RVD 09/19/2014  Cycle 3 RVD 10/17/2014  Cycle 4 RVD 11/14/2014 (Decadron reduced to 20 mg weekly, Revlimid 15 mg days 1 through 14, Velcade 3/4 weeks)  Cycle 5 RVD 12/12/2014  Serum M spike not detected 12/26/2014  Cycle 6 RVD 01/09/2015  Maintenance Revlimid, 10 mg daily, 02/05/2015 , discontinue 02/26/2015 secondary to leg edema and diarrhea  Revlimid resumed at a dose of 10 mg every other day beginning 03/13/2015  PET scan 19/62/2297-LG hypermetabolic bone lesions, few lucent lesions in the spine and pelvis  Revlimid discontinued January 2019  Enrollment on a clinical trial at [facility] with pomalidomide/Decadron+/- ixazomibbeginning 03/10/2017  Treatment discontinued February 2021 secondary to rising serum lambda light chains  PET 04/05/2019-hypermetabolic right axillary/chest wall nodes, right paravertebral mass, small right pleural effusion, retrocrural node, right lateral abdominal wall mass, L5 lesion mild compression deformity. Focus of hypermetabolism at the anterior left pelvic wall without a CT correlate. Hypermetabolic lytic lesion in the left T3 transverse process  Cycle 1 daratumumab/carfilzomib/Decadron 04/05/2019  Radiation to the right chest wall mass and lumbar  spine beginning 04/25/2019  Cycle 2 daratumumab/carfilzomib/Decadron 05/04/2019 (carfilzomib dose escalated); day 8 held due to neutropenia, thrombocytopenia; day 15 05/18/2019 (carfilzomib dose reduced)  Daratumumab/carfilzomib/Decadron changed to every 2-week dosing beginning 05/25/2019  Daratumumab changed to a monthly  schedule beginning 09/21/2019; carfilzomib every 2 weeks  Daratumumab held 11/15/2019 secondary to thrombocytopenia, carfilzomib continued at a reduced dose  CT chest 11/19/2019-T10 paraspinous mass enlarged compared to 09/13/2019, other bone lesions unchanged  Daratumumab/carfilzomib/Decadron 12/14/2019  Daratumumab/carfilzomib/Decadron 12/27/2019  Daratumumab/carfilzomib/Decadron 01/11/2020  PET 01/24/2020-general improvement in previously noted bone lesions with sclerosis, improvement in chest adenopathy, enlargement of right lower thoracic paraspinal/pleural mass and separate pleural tumor deposits on the right, increased right and left pleural effusions, new small to moderate pericardial effusion, prepatellar bursitis/edema on the right, inflammatory activity at the left lateral calf  Daratumumab/carfilzomib/Decadron 01/24/2020  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 History of intermittent prostatitis  4. Skin rash 10/24/2014-referred to dermatology, biopsy consistent with local hypersensitivity reaction  5. 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 Left lower leg cellulitis 06/24/2015-treated with Keflex  7. History of mild thrombocytopenia secondary tomyeloma and systemic therapy  8.  History of mild neutropenia-secondary to myeloma and systemic therapy  9. Fall with a skull fracture and subarachnoid blood/right frontal lobe contusions 10/17/2015  10. Right lower lobe pulmonary embolus 03/28/2016. Lovenox , transitioned to Xarelto  11. Vesicular rashJune 2018-potentially related to the zoster vaccine, resolved  12. History of atrial fibrillation  13. Aortic stenosis-TAVR 10/25/2019  14. 07/02/2019-hospital admission for sepsis secondary to cellulitis of the left lower extremity with gram-negative rod bacteremia(Aeromonascaviae)  15. Pleuritic chest pain - onset 10/2019, cardiac w/u negative   16. Right shoulder pain, possible bone spur on 04/2019 PET, likely MSK related (12/26/19)  17. Eczematous rash (12/26/19),  not felt to be drug rash/allergy. 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  The chest wall discomfort is likely related to pleural and bone lesions from multiple myeloma.  The IgA and lambda light chains are slowly increasing.  The hemoglobin remains normal and the platelet count is partially improved.  He will be referred for a restaging chest CT within the next 1-2 weeks.  He will continue hydrocodone as needed for pain.  [name] will return for an office visit in 2 weeks.  We will follow-up on the IgA and light chains from today.  The plan is to refer him for an immunotherapy trial at [facility] if there is evidence of disease progression on the chest CT.  [name], MD  06/27/2020  5:11 PM

## 2020-06-28 LAB — KAPPA/LAMBDA LIGHT CHAINS
Kappa free light chain: 3.9 mg/L (ref 3.3–19.4)
Kappa, lambda light chain ratio: 0.02 — ABNORMAL LOW (ref 0.26–1.65)
Lambda free light chains: 209.9 mg/L — ABNORMAL HIGH (ref 5.7–26.3)

## 2020-06-28 LAB — IGA: IgA: 54 mg/dL — ABNORMAL LOW (ref 61–437)

## 2020-07-04 ENCOUNTER — Other Ambulatory Visit (HOSPITAL_COMMUNITY): Payer: Self-pay

## 2020-07-04 ENCOUNTER — Other Ambulatory Visit: Payer: Self-pay

## 2020-07-04 ENCOUNTER — Ambulatory Visit (HOSPITAL_BASED_OUTPATIENT_CLINIC_OR_DEPARTMENT_OTHER)
Admission: RE | Admit: 2020-07-04 | Discharge: 2020-07-04 | Disposition: A | Discharge status: Home / Self Care | Payer: Medicare PPO | Source: Ambulatory Visit | Attending: Oncology | Admitting: Oncology

## 2020-07-04 DIAGNOSIS — C9002 Multiple myeloma in relapse: Secondary | ICD-10-CM | POA: Diagnosis not present

## 2020-07-04 DIAGNOSIS — C9000 Multiple myeloma not having achieved remission: Secondary | ICD-10-CM | POA: Diagnosis not present

## 2020-07-04 MED FILL — Rivaroxaban Tab 20 MG: ORAL | 30 days supply | Qty: 30 | Fill #1 | Status: AC

## 2020-07-05 ENCOUNTER — Telehealth: Payer: Self-pay | Admitting: *Deleted

## 2020-07-05 DIAGNOSIS — C9000 Multiple myeloma not having achieved remission: Secondary | ICD-10-CM | POA: Diagnosis not present

## 2020-07-05 DIAGNOSIS — I288 Other diseases of pulmonary vessels: Secondary | ICD-10-CM | POA: Diagnosis not present

## 2020-07-05 DIAGNOSIS — J9811 Atelectasis: Secondary | ICD-10-CM | POA: Diagnosis not present

## 2020-07-05 DIAGNOSIS — J90 Pleural effusion, not elsewhere classified: Secondary | ICD-10-CM | POA: Diagnosis not present

## 2020-07-05 DIAGNOSIS — M799 Soft tissue disorder, unspecified: Secondary | ICD-10-CM | POA: Diagnosis not present

## 2020-07-05 NOTE — Telephone Encounter (Signed)
Informed patient that CT chest showed multiple progressive chest wall/spine lesions and right axillary node. Needs to see Dr. Amalia Hailey asap to begin systemic treatment. He understands and agrees and will call Dr. Amalia Hailey today.  Routed CT report to Dr. Amalia Hailey and emailed Tedra Coupe in radiology to push images over to [facility] system. Patient asking if he should keep his 6/15 visit with [name]. Will f/u with [name] and call him back w/response.

## 2020-07-05 NOTE — Telephone Encounter (Signed)
-----   Message from Ladell Pier, MD sent at 07/04/2020  4:28 PM EDT ----- Please call patient, I am sure he has seen report, multiple progressive chest wall/spine lesions and rt. Axillary nodes Send report to Dr. Amalia Hailey, he needs appt. With Dr. Amalia Hailey ASAP, needs to start systemic therapy

## 2020-07-10 DIAGNOSIS — R0781 Pleurodynia: Secondary | ICD-10-CM | POA: Diagnosis not present

## 2020-07-10 DIAGNOSIS — C9002 Multiple myeloma in relapse: Secondary | ICD-10-CM | POA: Diagnosis not present

## 2020-07-10 DIAGNOSIS — D61810 Antineoplastic chemotherapy induced pancytopenia: Secondary | ICD-10-CM | POA: Diagnosis not present

## 2020-07-10 DIAGNOSIS — D61818 Other pancytopenia: Secondary | ICD-10-CM | POA: Diagnosis not present

## 2020-07-10 DIAGNOSIS — Z923 Personal history of irradiation: Secondary | ICD-10-CM | POA: Diagnosis not present

## 2020-07-10 DIAGNOSIS — D801 Nonfamilial hypogammaglobulinemia: Secondary | ICD-10-CM | POA: Diagnosis not present

## 2020-07-10 DIAGNOSIS — Z6824 Body mass index (BMI) 24.0-24.9, adult: Secondary | ICD-10-CM | POA: Diagnosis not present

## 2020-07-10 DIAGNOSIS — I4891 Unspecified atrial fibrillation: Secondary | ICD-10-CM | POA: Diagnosis not present

## 2020-07-10 DIAGNOSIS — G629 Polyneuropathy, unspecified: Secondary | ICD-10-CM | POA: Diagnosis not present

## 2020-07-11 ENCOUNTER — Other Ambulatory Visit: Payer: Self-pay

## 2020-07-11 ENCOUNTER — Inpatient Hospital Stay (HOSPITAL_BASED_OUTPATIENT_CLINIC_OR_DEPARTMENT_OTHER): Payer: Medicare PPO | Admitting: Oncology

## 2020-07-11 ENCOUNTER — Inpatient Hospital Stay: Payer: Medicare PPO | Admitting: Oncology

## 2020-07-11 DIAGNOSIS — C9002 Multiple myeloma in relapse: Secondary | ICD-10-CM | POA: Diagnosis not present

## 2020-07-11 NOTE — Progress Notes (Signed)
[place] OFFICE VISIT PROGRESS NOTE  I connected with Gloris Ham on 07/11/20 at  2:00 PM EDT by telephone visit and verified that I am speaking with the correct person using two identifiers.   I discussed the limitations, risks, security and privacy concerns of performing an evaluation and management service by telemedicine and the availability of in-person appointments. I also discussed with the patient that there may be a patient responsible charge related to this service. The patient expressed understanding and agreed to proceed.  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  He underwent a repeat chest CT on 07/04/2020.  This confirmed evidence of progressive disease with progressive chest wall masses and new axillary adenopathy.  He saw Dr. Amalia Hailey yesterday.  They discuss systemic treatment options including Bendamustine, BC May directed therapies, belantamab mafodotin, and CAR-T therapy.  Dr. Amalia Hailey recommends treatment on study with a by specific antibody, [id number].  [name] would like to proceed with treatment on this study.  Dr. Amalia Hailey recommends a staging PET scan and biopsy of an axillary lymph node prior to beginning treatment.  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  He takes Tylenol and occasional hydrocodone for relief of pain.  No dyspnea.  No new complaint.   Lab Results:  Lab Results  Component Value Date   WBC 4.3 06/27/2020   HGB 13.1 06/27/2020   HCT 40.9 06/27/2020   MCV 90.5 06/27/2020   PLT 76 (L) 06/27/2020   NEUTROABS 3.0 06/27/2020    Imaging: CT images from 07/04/2020 reviewed Medications: I have reviewed the patient's current medications.  Assessment/Plan: Multiple  myeloma-confirmed on a bone marrow biopsy 08/07/2014, IgA lambda Serum M spike and increased serum free lambda light chains Myeloma FISH panel negative for chromosome 4, 11, 12, 13, 14, and 17 abnormalities, cytogenetics with no metaphases Bone survey 08/15/2014 with indeterminant lucent skull lesions Cycle 1 RVD 08/22/2014 Cycle 2 RVD 09/19/2014 Cycle 3 RVD 10/17/2014 Cycle 4 RVD 11/14/2014 (Decadron reduced to 20 mg weekly, Revlimid 15 mg days 1 through 14, Velcade 3/4 weeks) Cycle 5 RVD 12/12/2014 Serum M spike not detected 12/26/2014 Cycle 6 RVD 01/09/2015 Maintenance Revlimid, 10 mg daily, 02/05/2015 , discontinue 02/26/2015 secondary to leg edema and diarrhea Revlimid resumed at a dose of 10 mg every other day beginning 03/13/2015 PET scan 38/75/6433-IR hypermetabolic bone lesions, few lucent lesions in the spine and pelvis Revlimid discontinued January 2019 Enrollment on a clinical trial at [facility] with pomalidomide/Decadron +/- ixazomib beginning 03/10/2017 Treatment discontinued February 2021 secondary to rising serum lambda light chains PET 04/05/2019-hypermetabolic right axillary/chest wall nodes,  right paravertebral mass, small right pleural effusion,  retrocrural node, right lateral abdominal wall mass, L5 lesion mild compression deformity.  Focus of hypermetabolism at the anterior left pelvic wall without a CT correlate.  Hypermetabolic lytic lesion in the left T3 transverse process Cycle 1 daratumumab/carfilzomib/Decadron 04/05/2019 Radiation to the right chest wall mass and lumbar spine beginning 04/25/2019 Cycle 2 daratumumab/carfilzomib/Decadron 05/04/2019 (carfilzomib dose escalated); day 8 held due to neutropenia, thrombocytopenia; day 15 05/18/2019 (carfilzomib dose reduced) Daratumumab/carfilzomib/Decadron changed to every 2-week dosing beginning 05/25/2019 Daratumumab changed to a monthly schedule beginning 09/21/2019; carfilzomib every 2 weeks Daratumumab held 11/15/2019 secondary to  thrombocytopenia, carfilzomib continued at a reduced dose CT  chest 11/19/2019-T10 paraspinous mass enlarged compared to 09/13/2019, other bone lesions unchanged Daratumumab/carfilzomib/Decadron 12/14/2019 Daratumumab/carfilzomib/Decadron 12/27/2019 Daratumumab/carfilzomib/Decadron 01/11/2020 PET 01/24/2020-general improvement in previously noted bone lesions with sclerosis, improvement in chest adenopathy, enlargement of right lower thoracic paraspinal/pleural mass and separate pleural tumor deposits on the right, increased right and left pleural effusions, new small to moderate pericardial effusion, prepatellar bursitis/edema on the right, inflammatory activity at the left lateral calf Daratumumab/carfilzomib/Decadron 01/24/2020 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, lambda light chain restricted Palliative radiation to T10 and paraspinal mass, 10 fractions, 02/15/2020 through 02/28/2020 Bone marrow biopsy 04/10/2020-normocellular marrow with no increase in plasma cells, mild decrease in megakaryocytes on the bone marrow biopsy, no light chain restricted plasma cell population identified decreased iron stores, normal karyotype, myeloma FISH panel negative CT chest 07/04/2020-development of bulky right axillary adenopathy, progressive chest wall masses, destructive mass at the right side of T10, juxta cardiac node, right retrocrural node mild collapse/consolidative opacity in the dependent right lower lobe, right pleural effusion Stage TIc (Gleason 4+5, 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   History of intermittent prostatitis   4.   Skin rash 10/24/2014-referred to dermatology, biopsy consistent with local hypersensitivity reaction   5.   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   Left lower leg cellulitis 06/24/2015-treated with Keflex   7.   History of mild thrombocytopenia secondary to myeloma and systemic therapy   8.   History of mild neutropenia- secondary to myeloma and systemic therapy   9.   Fall with a skull fracture and subarachnoid blood/right frontal lobe contusions 10/17/2015   10. Right lower lobe pulmonary embolus 03/28/2016. Lovenox , transitioned to Xarelto   11. Vesicular rash June 2018-potentially related to the zoster vaccine, resolved   12.  History of atrial fibrillation   13.  Aortic stenosis-TAVR 10/25/2019   14.  07/02/2019-hospital admission for sepsis secondary to cellulitis of the left lower extremity with gram-negative rod bacteremia (Aeromonas caviae)   15. Pleuritic chest pain - onset 10/2019, cardiac w/u negative    16. Right shoulder pain, possible bone spur on 04/2019 PET, likely MSK related (12/26/19)   17. Eczematous rash (12/26/19),  not felt to be drug rash/allergy. 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  I discussed the CT findings and treatment options with [name].  I reviewed the note from Dr. Amalia Hailey.  The plan is to proceed with treatment on study with the [id number] by specific antibody.  [name] would like to begin treatment as soon as possible.  I will order a PET scan for ASAP to be followed by biopsy of a hypermetabolic lymph node. He will be scheduled for an office visit in approximately 2 weeks.  I am available to see him sooner as needed.   I discussed the assessment and treatment plan with the patient. The patient was provided an opportunity to ask questions and all were answered. The patient agreed with the plan and demonstrated an understanding of the instructions.   The patient was advised to call back or seek an in-person evaluation if the symptoms worsen or if the condition fails to  improve as anticipated.  I provided 25 minutes of chart review, telephone, and documentation time during this encounter, and >  50% was spent counseling as documented under my assessment & plan.  [name] ANP/GNP-BC   07/11/2020 2:22 PM

## 2020-07-12 ENCOUNTER — Telehealth: Payer: Self-pay

## 2020-07-12 NOTE — Telephone Encounter (Signed)
ERROR

## 2020-07-18 ENCOUNTER — Other Ambulatory Visit: Payer: Self-pay | Admitting: Oncology

## 2020-07-18 DIAGNOSIS — C9002 Multiple myeloma in relapse: Secondary | ICD-10-CM

## 2020-07-19 ENCOUNTER — Telehealth: Payer: Self-pay

## 2020-07-19 NOTE — Telephone Encounter (Signed)
TC to scheduling [phone] who does the lymph node biopsy scheduling to follow up on orders placed for Pt to be scheduled for biopsy.Spoke with scheduling Lennox Solders who is doing scheduling is aware of order.

## 2020-07-20 ENCOUNTER — Ambulatory Visit (HOSPITAL_COMMUNITY): Admission: RE | Admit: 2020-07-20 | Payer: Medicare PPO | Source: Ambulatory Visit

## 2020-07-20 ENCOUNTER — Telehealth (HOSPITAL_COMMUNITY): Payer: Self-pay

## 2020-07-20 ENCOUNTER — Ambulatory Visit (HOSPITAL_COMMUNITY)
Admission: RE | Admit: 2020-07-20 | Discharge: 2020-07-20 | Disposition: A | Discharge status: Home / Self Care | Payer: Medicare PPO | Source: Ambulatory Visit | Attending: Oncology | Admitting: Oncology

## 2020-07-20 ENCOUNTER — Other Ambulatory Visit: Payer: Self-pay

## 2020-07-20 DIAGNOSIS — C9000 Multiple myeloma not having achieved remission: Secondary | ICD-10-CM | POA: Diagnosis not present

## 2020-07-20 DIAGNOSIS — J90 Pleural effusion, not elsewhere classified: Secondary | ICD-10-CM | POA: Insufficient documentation

## 2020-07-20 DIAGNOSIS — C7989 Secondary malignant neoplasm of other specified sites: Secondary | ICD-10-CM | POA: Diagnosis not present

## 2020-07-20 DIAGNOSIS — C9002 Multiple myeloma in relapse: Secondary | ICD-10-CM | POA: Diagnosis not present

## 2020-07-20 DIAGNOSIS — R590 Localized enlarged lymph nodes: Secondary | ICD-10-CM | POA: Insufficient documentation

## 2020-07-20 LAB — GLUCOSE, CAPILLARY: Glucose-Capillary: 97 mg/dL (ref 70–99)

## 2020-07-20 MED ORDER — FLUDEOXYGLUCOSE F - 18 (FDG) INJECTION
8.7500 | Freq: Once | INTRAVENOUS | Status: AC
  Administered 2020-07-20: 8.82 via INTRAVENOUS

## 2020-07-23 ENCOUNTER — Encounter (HOSPITAL_COMMUNITY): Payer: Self-pay

## 2020-07-23 NOTE — Progress Notes (Unsigned)
       Patient Demographics  Patient Name  Mitchell Lowery, Mitchell Lowery Legal Sex  Male DOB  1935/01/15 SSN  [id number] Address  [address]  [place] [address] Phone  [phone] [facility])  [phone] (Mobile) *Preferred*     RE: Biopsy Received: 4 days ago Suttle, Rosanne Ashing, MD  [facility] E Approved for ultrasound guided right axillary lymph node biopsy.   Mitchell Lowery         Previous Messages    ----- Message -----  From: Lenore Cordia  Sent: 07/19/2020   1:18 PM EDT  To: Ir Procedure Requests  Subject: Biopsy                                         Procedure Requested:US Biopsy    Reason for Procedure:  myeloma, new axillary adenopathy    Provider Requesting:  Dr Benay Spice  Provider Telephone: [phone]    Other Info:

## 2020-07-23 NOTE — Progress Notes (Signed)
       Patient Demographics  Patient Name  Aisea, Bouldin Legal Sex  Male DOB  1934/06/11 SSN  [id number] Address  [address]  [place] [address] Phone  [phone] [facility])  [phone] (Mobile) *Preferred*     RE: Blood Thinner/Biopsy Received: Today Ladell Pier, MD  Milta Deiters to hold the Xarelto         Previous Messages    ----- Message -----  From: Lenore Cordia  Sent: 07/23/2020   9:18 AM EDT  To: Ladell Pier, MD  Subject: Blood Thinner/Biopsy                           Hello Dr Benay Spice   Patient Carlyon is scheduled to have a biopsy  He is on the Blood thinner Xarelto  He will need to hold it one day prior to his biopsy  Permission is needed   Kao Conry  ----- Message -----  From: Suzette Battiest, MD  Sent: 07/19/2020   4:06 PM EDT  To: Lenore Cordia  Subject: RE: Biopsy                                     Approved for ultrasound guided right axillary lymph node biopsy.   Dylan  ----- Message -----  From: Lenore Cordia  Sent: 07/19/2020   1:18 PM EDT  To: Ir Procedure Requests  Subject: Biopsy                                         Procedure Requested:US Biopsy    Reason for Procedure:  myeloma, new axillary adenopathy    Provider Requesting:  Dr Benay Spice  Provider Telephone: [phone]    Other Info:

## 2020-07-25 DIAGNOSIS — D61818 Other pancytopenia: Secondary | ICD-10-CM | POA: Diagnosis not present

## 2020-07-25 DIAGNOSIS — D649 Anemia, unspecified: Secondary | ICD-10-CM | POA: Diagnosis not present

## 2020-07-25 DIAGNOSIS — I4891 Unspecified atrial fibrillation: Secondary | ICD-10-CM | POA: Diagnosis not present

## 2020-07-25 DIAGNOSIS — N400 Enlarged prostate without lower urinary tract symptoms: Secondary | ICD-10-CM | POA: Diagnosis not present

## 2020-07-25 DIAGNOSIS — C61 Malignant neoplasm of prostate: Secondary | ICD-10-CM | POA: Diagnosis not present

## 2020-07-25 DIAGNOSIS — I10 Essential (primary) hypertension: Secondary | ICD-10-CM | POA: Diagnosis not present

## 2020-07-26 ENCOUNTER — Other Ambulatory Visit: Payer: Self-pay | Admitting: Radiology

## 2020-07-26 ENCOUNTER — Inpatient Hospital Stay (HOSPITAL_BASED_OUTPATIENT_CLINIC_OR_DEPARTMENT_OTHER): Payer: Medicare PPO | Admitting: Oncology

## 2020-07-26 ENCOUNTER — Ambulatory Visit (HOSPITAL_COMMUNITY): Payer: Medicare PPO

## 2020-07-26 ENCOUNTER — Other Ambulatory Visit: Payer: Self-pay

## 2020-07-26 VITALS — BP 129/65 | HR 88 | Temp 98.0°F | Resp 20 | Ht 71.0 in | Wt 173.2 lb

## 2020-07-26 DIAGNOSIS — C9000 Multiple myeloma not having achieved remission: Secondary | ICD-10-CM | POA: Diagnosis not present

## 2020-07-26 DIAGNOSIS — Z923 Personal history of irradiation: Secondary | ICD-10-CM | POA: Diagnosis not present

## 2020-07-26 MED ORDER — OXYCODONE HCL 5 MG PO TABS: 2.5000 mg | ORAL_TABLET | ORAL | 0 refills | Status: DC | PRN

## 2020-07-26 NOTE — Progress Notes (Signed)
Mitchell Lowery   Diagnosis: Multiple myeloma  INTERVAL HISTORY:   Mitchell Lowery returns as scheduled.  He continues to have discomfort at the right anterolateral chest wall.  He is taking Tylenol for pain.  He took hydrocodone in the past and this caused drowsiness.  He is walking daily.  He has noted a decrease in his appetite.  Mild exertional dyspnea.  Objective:  Vital signs in last 24 hours:  Blood pressure 129/65, pulse 88, temperature 98 F (36.7 C), temperature source Oral, resp. rate 20, height '5\' 11"'  (1.803 m), weight 173 lb 3.2 oz (78.6 kg), SpO2 96 %.   Resp: Decreased breath sounds at the right lower chest, no respiratory distress Cardio: Regular rate and rhythm GI: No hepatosplenomegaly Vascular: No leg edema  Skin: Abrasion at the left lower leg scab   Lab Results:  Lab Results  Component Value Date   WBC 4.3 06/27/2020   HGB 13.1 06/27/2020   HCT 40.9 06/27/2020   MCV 90.5 06/27/2020   PLT 76 (L) 06/27/2020   NEUTROABS 3.0 06/27/2020    CMP  Lab Results  Component Value Date   NA 140 06/27/2020   K 4.1 06/27/2020   CL 103 06/27/2020   CO2 29 06/27/2020   GLUCOSE 69 (L) 06/27/2020   BUN 15 06/27/2020   CREATININE 0.74 06/27/2020   CALCIUM 8.8 (L) 06/27/2020   PROT 5.5 (L) 06/27/2020   ALBUMIN 3.7 06/27/2020   AST 16 06/27/2020   ALT 12 06/27/2020   ALKPHOS 39 06/27/2020   BILITOT 0.5 06/27/2020   GFRNONAA >60 06/27/2020   GFRAA >60 10/26/2019      Imaging: PET images from 07/20/2020 reviewed with Mitchell Lowery and his wife  Medications: I have reviewed the patient's current medications.   Assessment/Plan: Multiple myeloma-confirmed on a bone marrow biopsy 08/07/2014, IgA lambda Serum M spike and increased serum free lambda light chains Myeloma FISH panel negative for chromosome 4, 11, 12, 13, 14, and 17 abnormalities, cytogenetics with no metaphases Bone survey 08/15/2014 with indeterminant lucent skull  lesions Cycle 1 RVD 08/22/2014 Cycle 2 RVD 09/19/2014 Cycle 3 RVD 10/17/2014 Cycle 4 RVD 11/14/2014 (Decadron reduced to 20 mg weekly, Revlimid 15 mg days 1 through 14, Velcade 3/4 weeks) Cycle 5 RVD 12/12/2014 Serum M spike not detected 12/26/2014 Cycle 6 RVD 01/09/2015 Maintenance Revlimid, 10 mg daily, 02/05/2015 , discontinue 02/26/2015 secondary to leg edema and diarrhea Revlimid resumed at a dose of 10 mg every other day beginning 03/13/2015 PET scan 25/00/3704-UG hypermetabolic bone lesions, few lucent lesions in the spine and pelvis Revlimid discontinued January 2019 Enrollment on a clinical trial at [facility] with pomalidomide/Decadron +/- ixazomib beginning 03/10/2017 Treatment discontinued February 2021 secondary to rising serum lambda light chains PET 04/05/2019-hypermetabolic right axillary/chest wall nodes,  right paravertebral mass, small right pleural effusion,  retrocrural node, right lateral abdominal wall mass, L5 lesion mild compression deformity.  Focus of hypermetabolism at the anterior left pelvic wall without a CT correlate.  Hypermetabolic lytic lesion in the left T3 transverse process Cycle 1 daratumumab/carfilzomib/Decadron 04/05/2019 Radiation to the right chest wall mass and lumbar spine beginning 04/25/2019 Cycle 2 daratumumab/carfilzomib/Decadron 05/04/2019 (carfilzomib dose escalated); day 8 held due to neutropenia, thrombocytopenia; day 15 05/18/2019 (carfilzomib dose reduced) Daratumumab/carfilzomib/Decadron changed to every 2-week dosing beginning 05/25/2019 Daratumumab changed to a monthly schedule beginning 09/21/2019; carfilzomib every 2 weeks Daratumumab held 11/15/2019 secondary to thrombocytopenia, carfilzomib continued at a reduced dose CT chest 11/19/2019-T10 paraspinous mass enlarged compared  to 09/13/2019, other bone lesions unchanged Daratumumab/carfilzomib/Decadron 12/14/2019 Daratumumab/carfilzomib/Decadron 12/27/2019 Daratumumab/carfilzomib/Decadron  01/11/2020 PET 01/24/2020-general improvement in previously noted bone lesions with sclerosis, improvement in chest adenopathy, enlargement of right lower thoracic paraspinal/pleural mass and separate pleural tumor deposits on the right, increased right and left pleural effusions, new small to moderate pericardial effusion, prepatellar bursitis/edema on the right, inflammatory activity at the left lateral calf Daratumumab/carfilzomib/Decadron 01/24/2020 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, lambda light chain restricted Palliative radiation to T10 and paraspinal mass, 10 fractions, 02/15/2020 through 02/28/2020 Bone marrow biopsy 04/10/2020-normocellular marrow with no increase in plasma cells, mild decrease in megakaryocytes on the bone marrow biopsy, no light chain restricted plasma cell population identified decreased iron stores, normal karyotype, myeloma FISH panel negative CT chest 07/04/2020-development of bulky right axillary adenopathy, progressive chest wall masses, destructive mass at the right side of T10, juxta cardiac node, right retrocrural node mild collapse/consolidative opacity in the dependent right lower lobe, right pleural effusion PET 05/05/8117-JYN hypermetabolic right axillary lymph nodes, an epicardial lymph node, and a retrocrural lymph node, marked soft tissue expansion involving right lower rib lesions, increased right pleural effusion, new bone lesions at the ischium and pubis, left T3 transverse process lesion, Stage TIc (Gleason 4+5, 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   History of intermittent prostatitis   4.   Skin rash 10/24/2014-referred to dermatology, biopsy consistent with local hypersensitivity reaction   5.   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   Left lower leg cellulitis 06/24/2015-treated with Keflex   7.   History of mild thrombocytopenia secondary to myeloma and systemic therapy   8.   History of mild neutropenia- secondary to myeloma and systemic therapy   9.   Fall with a skull fracture and subarachnoid blood/right frontal lobe contusions 10/17/2015   10. Right lower lobe pulmonary embolus 03/28/2016. Lovenox , transitioned to Xarelto   11. Vesicular rash June 2018-potentially related to the zoster vaccine, resolved   12.  History of atrial fibrillation   13.  Aortic stenosis-TAVR 10/25/2019   14.  07/02/2019-hospital admission for sepsis secondary to cellulitis of the left lower extremity with gram-negative rod bacteremia (Aeromonas caviae)   15. Pleuritic chest pain - onset 10/2019, cardiac w/u negative    16. Right shoulder pain, possible bone spur on 04/2019 PET, likely MSK related (12/26/19)   17. Eczematous rash (12/26/19),  not felt to be drug rash/allergy. Recommended hydrocortisone topical PRN  Disposition: Mitchell Lowery has clinical progression of multiple myeloma.  I reviewed the PET images with him.  He continues to be symptomatic with pain related to chest wall/pleural lesions.  He will begin a trial of oxycodone as needed for pain.  He has a large right pleural effusion, but does not have significant dyspnea at present.  He will call for increased dyspnea and we will arrange for a therapeutic thoracentesis.  Mitchell Lowery is scheduled for biopsy of a right axillary lymph node tomorrow.  He is hoping to enroll on a clinical trial at [facility] in July.  He will return for an office visit here in 2 weeks.  We will follow-up on the axillary lymph node biopsy and coordinate clinical trial enrollment with Dr. Amalia Hailey.  [name], MD  07/26/2020  9:29 AM

## 2020-07-27 ENCOUNTER — Ambulatory Visit (HOSPITAL_COMMUNITY)
Admission: RE | Admit: 2020-07-27 | Discharge: 2020-07-27 | Disposition: A | Discharge status: Home / Self Care | Payer: Medicare PPO | Source: Ambulatory Visit | Attending: Oncology | Admitting: Oncology

## 2020-07-27 ENCOUNTER — Other Ambulatory Visit: Payer: Self-pay

## 2020-07-27 DIAGNOSIS — C9000 Multiple myeloma not having achieved remission: Secondary | ICD-10-CM | POA: Insufficient documentation

## 2020-07-27 DIAGNOSIS — D4989 Neoplasm of unspecified behavior of other specified sites: Secondary | ICD-10-CM | POA: Diagnosis not present

## 2020-07-27 DIAGNOSIS — R590 Localized enlarged lymph nodes: Secondary | ICD-10-CM | POA: Diagnosis not present

## 2020-07-27 DIAGNOSIS — C9002 Multiple myeloma in relapse: Secondary | ICD-10-CM

## 2020-07-27 LAB — CBC
HCT: 38.5 % — ABNORMAL LOW (ref 39.0–52.0)
Hemoglobin: 12.2 g/dL — ABNORMAL LOW (ref 13.0–17.0)
MCH: 28.6 pg (ref 26.0–34.0)
MCHC: 31.7 g/dL (ref 30.0–36.0)
MCV: 90.2 fL (ref 80.0–100.0)
Platelets: 65 10*3/uL — ABNORMAL LOW (ref 150–400)
RBC: 4.27 MIL/uL (ref 4.22–5.81)
RDW: 14.1 % (ref 11.5–15.5)
WBC: 3.9 10*3/uL — ABNORMAL LOW (ref 4.0–10.5)
nRBC: 0 % (ref 0.0–0.2)

## 2020-07-27 LAB — PROTIME-INR
INR: 1.1 (ref 0.8–1.2)
Prothrombin Time: 13.9 seconds (ref 11.4–15.2)

## 2020-07-27 MED ORDER — SODIUM CHLORIDE 0.9 % IV SOLN: INTRAVENOUS | Status: DC

## 2020-07-27 MED ORDER — FENTANYL CITRATE (PF) 100 MCG/2ML IJ SOLN
INTRAMUSCULAR | Status: AC | PRN
  Administered 2020-07-27: 50 ug via INTRAVENOUS

## 2020-07-27 MED ORDER — MIDAZOLAM HCL 2 MG/2ML IJ SOLN
INTRAMUSCULAR | Status: AC | PRN
  Administered 2020-07-27: 1 mg via INTRAVENOUS

## 2020-07-27 MED ORDER — LIDOCAINE HCL (PF) 1 % IJ SOLN
INTRAMUSCULAR | Status: AC
  Filled 2020-07-27: qty 30

## 2020-07-27 MED ORDER — SODIUM CHLORIDE 0.9 % IV SOLN
INTRAVENOUS | Status: AC | PRN
  Administered 2020-07-27: 10 mL/h via INTRAVENOUS

## 2020-07-27 MED ORDER — FENTANYL CITRATE (PF) 100 MCG/2ML IJ SOLN
INTRAMUSCULAR | Status: AC
  Filled 2020-07-27: qty 2

## 2020-07-27 MED ORDER — MIDAZOLAM HCL 2 MG/2ML IJ SOLN
INTRAMUSCULAR | Status: AC
  Filled 2020-07-27: qty 2

## 2020-07-27 NOTE — Procedures (Signed)
Interventional Radiology Procedure:   Indications: Multiple myeloma and evidence for progressive disease  Procedure: US guided right axillary lymph node biopsy  Findings: Enlarged right axillary nodes.  Multiple cores obtained from a node.  Complications: None     EBL: less than 5 ml  Plan: Discharge to home in 1 hour   Billey Wojciak R. Anselm Pancoast, MD  Pager: [phone]

## 2020-07-27 NOTE — Sedation Documentation (Signed)
02 d/c 
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
## 2020-07-27 NOTE — Progress Notes (Signed)
Pt ambulated without difficulty or bleeding.   Discharged home with his wife who will drive and stay with pt x 24 hrs. 

## 2020-07-31 ENCOUNTER — Telehealth: Payer: Self-pay | Admitting: Nurse Practitioner

## 2020-07-31 NOTE — Telephone Encounter (Signed)
Per [name] preliminary report on the lymph node biopsy from 07/27/2020 shows myeloma.  Mitchell Lowery was notified of this finding.

## 2020-08-01 LAB — SURGICAL PATHOLOGY

## 2020-08-02 ENCOUNTER — Encounter: Payer: Self-pay | Admitting: Oncology

## 2020-08-03 ENCOUNTER — Other Ambulatory Visit (HOSPITAL_COMMUNITY): Payer: Self-pay

## 2020-08-03 ENCOUNTER — Other Ambulatory Visit: Payer: Self-pay | Admitting: Nurse Practitioner

## 2020-08-03 DIAGNOSIS — C9000 Multiple myeloma not having achieved remission: Secondary | ICD-10-CM

## 2020-08-03 MED ORDER — OXYCODONE HCL 5 MG PO TABS: 2.5000 mg | ORAL_TABLET | ORAL | 0 refills | Status: DC | PRN

## 2020-08-03 MED FILL — Rivaroxaban Tab 20 MG: ORAL | 30 days supply | Qty: 30 | Fill #2 | Status: AC

## 2020-08-08 ENCOUNTER — Inpatient Hospital Stay: Payer: Medicare PPO | Attending: Oncology | Admitting: Nurse Practitioner

## 2020-08-08 ENCOUNTER — Encounter: Payer: Self-pay | Admitting: Nurse Practitioner

## 2020-08-08 ENCOUNTER — Other Ambulatory Visit: Payer: Self-pay

## 2020-08-08 VITALS — BP 122/71 | HR 87 | Temp 97.6°F | Resp 18

## 2020-08-08 DIAGNOSIS — Z79899 Other long term (current) drug therapy: Secondary | ICD-10-CM | POA: Diagnosis not present

## 2020-08-08 DIAGNOSIS — Z923 Personal history of irradiation: Secondary | ICD-10-CM | POA: Insufficient documentation

## 2020-08-08 DIAGNOSIS — C9000 Multiple myeloma not having achieved remission: Secondary | ICD-10-CM | POA: Diagnosis not present

## 2020-08-08 MED ORDER — OXYCODONE HCL 5 MG PO TABS: 2.5000 mg | ORAL_TABLET | ORAL | 0 refills | Status: DC | PRN
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
## 2020-08-09 ENCOUNTER — Encounter: Payer: Self-pay | Admitting: Oncology

## 2020-08-09 NOTE — Telephone Encounter (Signed)
left message that hhis appts for the [facility] clinical trial are in the [facility] my chart.  Feel free to call him back if you want to discuss.  He wanted to let  you know they are there.

## 2020-08-10 DIAGNOSIS — C61 Malignant neoplasm of prostate: Secondary | ICD-10-CM | POA: Diagnosis not present

## 2020-08-17 DIAGNOSIS — N400 Enlarged prostate without lower urinary tract symptoms: Secondary | ICD-10-CM | POA: Diagnosis not present

## 2020-08-17 DIAGNOSIS — Z8546 Personal history of malignant neoplasm of prostate: Secondary | ICD-10-CM | POA: Diagnosis not present

## 2020-08-17 DIAGNOSIS — C61 Malignant neoplasm of prostate: Secondary | ICD-10-CM | POA: Diagnosis not present

## 2020-08-17 DIAGNOSIS — R351 Nocturia: Secondary | ICD-10-CM | POA: Diagnosis not present

## 2020-08-21 DIAGNOSIS — C9000 Multiple myeloma not having achieved remission: Secondary | ICD-10-CM | POA: Diagnosis not present

## 2020-08-22 DIAGNOSIS — R4182 Altered mental status, unspecified: Secondary | ICD-10-CM | POA: Diagnosis not present

## 2020-08-22 DIAGNOSIS — C9002 Multiple myeloma in relapse: Secondary | ICD-10-CM | POA: Diagnosis not present

## 2020-08-22 DIAGNOSIS — I6782 Cerebral ischemia: Secondary | ICD-10-CM | POA: Diagnosis not present

## 2020-08-22 DIAGNOSIS — G319 Degenerative disease of nervous system, unspecified: Secondary | ICD-10-CM | POA: Diagnosis not present

## 2020-08-23 DIAGNOSIS — C9000 Multiple myeloma not having achieved remission: Secondary | ICD-10-CM | POA: Diagnosis not present

## 2020-08-23 DIAGNOSIS — R4182 Altered mental status, unspecified: Secondary | ICD-10-CM | POA: Diagnosis not present

## 2020-08-24 DIAGNOSIS — I313 Pericardial effusion (noninflammatory): Secondary | ICD-10-CM | POA: Diagnosis not present

## 2020-08-24 DIAGNOSIS — Z6824 Body mass index (BMI) 24.0-24.9, adult: Secondary | ICD-10-CM | POA: Diagnosis not present

## 2020-08-24 DIAGNOSIS — I10 Essential (primary) hypertension: Secondary | ICD-10-CM | POA: Diagnosis not present

## 2020-08-24 DIAGNOSIS — I4891 Unspecified atrial fibrillation: Secondary | ICD-10-CM | POA: Diagnosis not present

## 2020-08-24 DIAGNOSIS — C9002 Multiple myeloma in relapse: Secondary | ICD-10-CM | POA: Diagnosis not present

## 2020-08-31 ENCOUNTER — Other Ambulatory Visit: Payer: Self-pay | Admitting: Nurse Practitioner

## 2020-08-31 DIAGNOSIS — C9000 Multiple myeloma not having achieved remission: Secondary | ICD-10-CM

## 2020-08-31 MED ORDER — OXYCODONE HCL 5 MG PO TABS: 2.5000 mg | ORAL_TABLET | ORAL | 0 refills | Status: DC | PRN

## 2020-09-11 DIAGNOSIS — C9002 Multiple myeloma in relapse: Secondary | ICD-10-CM | POA: Diagnosis not present

## 2020-09-11 DIAGNOSIS — D61818 Other pancytopenia: Secondary | ICD-10-CM | POA: Diagnosis not present

## 2020-09-11 DIAGNOSIS — G893 Neoplasm related pain (acute) (chronic): Secondary | ICD-10-CM | POA: Diagnosis not present

## 2020-09-11 DIAGNOSIS — I4891 Unspecified atrial fibrillation: Secondary | ICD-10-CM | POA: Diagnosis not present

## 2020-09-11 DIAGNOSIS — G629 Polyneuropathy, unspecified: Secondary | ICD-10-CM | POA: Diagnosis not present

## 2020-09-11 DIAGNOSIS — D801 Nonfamilial hypogammaglobulinemia: Secondary | ICD-10-CM | POA: Diagnosis not present

## 2020-09-11 DIAGNOSIS — J90 Pleural effusion, not elsewhere classified: Secondary | ICD-10-CM | POA: Diagnosis not present

## 2020-09-11 DIAGNOSIS — I350 Nonrheumatic aortic (valve) stenosis: Secondary | ICD-10-CM | POA: Diagnosis not present

## 2020-09-11 DIAGNOSIS — I609 Nontraumatic subarachnoid hemorrhage, unspecified: Secondary | ICD-10-CM | POA: Diagnosis not present

## 2020-09-14 ENCOUNTER — Other Ambulatory Visit (HOSPITAL_COMMUNITY): Payer: Self-pay

## 2020-09-14 MED FILL — Rivaroxaban Tab 20 MG: ORAL | 30 days supply | Qty: 30 | Fill #3 | Status: AC

## 2020-09-18 ENCOUNTER — Telehealth: Payer: Self-pay

## 2020-09-18 DIAGNOSIS — C9002 Multiple myeloma in relapse: Secondary | ICD-10-CM | POA: Diagnosis not present

## 2020-09-18 NOTE — Telephone Encounter (Signed)
TC from Pt's wife stating Pt has to cancel appointment for 09/25/20 because he has an appointment at [facility] and will call to reschedule appointment after visit at [facility].

## 2020-09-25 ENCOUNTER — Inpatient Hospital Stay: Payer: Medicare PPO | Admitting: Oncology

## 2020-09-27 ENCOUNTER — Inpatient Hospital Stay: Payer: Medicare PPO | Attending: Oncology | Admitting: Nurse Practitioner

## 2020-09-27 ENCOUNTER — Encounter: Payer: Self-pay | Admitting: Nurse Practitioner

## 2020-09-27 ENCOUNTER — Other Ambulatory Visit: Payer: Self-pay

## 2020-09-27 VITALS — BP 104/68 | HR 68 | Temp 98.1°F | Resp 20 | Ht 71.0 in | Wt 158.4 lb

## 2020-09-27 DIAGNOSIS — Z86711 Personal history of pulmonary embolism: Secondary | ICD-10-CM | POA: Insufficient documentation

## 2020-09-27 DIAGNOSIS — Z7901 Long term (current) use of anticoagulants: Secondary | ICD-10-CM | POA: Insufficient documentation

## 2020-09-27 DIAGNOSIS — Z923 Personal history of irradiation: Secondary | ICD-10-CM | POA: Insufficient documentation

## 2020-09-27 DIAGNOSIS — I4891 Unspecified atrial fibrillation: Secondary | ICD-10-CM | POA: Diagnosis not present

## 2020-09-27 DIAGNOSIS — Z9221 Personal history of antineoplastic chemotherapy: Secondary | ICD-10-CM | POA: Insufficient documentation

## 2020-09-27 DIAGNOSIS — C9000 Multiple myeloma not having achieved remission: Secondary | ICD-10-CM | POA: Insufficient documentation

## 2020-09-27 MED ORDER — OXYCODONE HCL 5 MG PO TABS: 5.0000 mg | ORAL_TABLET | Freq: Once | ORAL | Status: DC

## 2020-09-27 NOTE — Progress Notes (Signed)
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  He was seen by Dr. Amalia Hailey at [facility] on 09/25/2020.  Dr. Amalia Hailey discontinued the current regimen due to progressive disease.  [name] was experiencing increased right chest wall pain, worsening performance status and a rise in serum free light chains.  Dr. Amalia Hailey indicated 2 best options-hospice versus further therapy with Bendamustine.  [name] confirms he is having more pain.  He takes oxycodone about 4 times a day.  Energy level is poor.  Appetite is poor.  He denies fever.  He has dyspnea on exertion.  He denies bleeding.  Objective:  Vital signs in last 24 hours:  Blood pressure 104/68, pulse 68, temperature 98.1 F (36.7 C), temperature source Oral, resp. rate 20, height '5\' 11"'  (1.803 m), weight 158 lb 6.4 oz (71.8 kg), SpO2 97 %.    HEENT: No thrush or ulcers. Resp: Lungs clear bilaterally. Cardio: Regular rate and rhythm.  Murmur noted. GI: No hepatosplenomegaly. Musculoskeletal: Tender right lateral lower ribs.  No mass. Skin: Left lower leg scab with no signs of obvious infection.   Lab Results:  Lab Results  Component Value Date   WBC 3.9 (L) 07/27/2020   HGB 12.2 (L) 07/27/2020   HCT 38.5 (L) 07/27/2020   MCV 90.2 07/27/2020   PLT 65 (L) 07/27/2020   NEUTROABS 3.0 06/27/2020    Imaging:  No results found.  Medications: I have reviewed the patient's current medications.  Assessment/Plan: Multiple myeloma-confirmed on a bone marrow biopsy 08/07/2014, IgA lambda Serum M spike and increased serum free lambda light chains Myeloma FISH panel negative for chromosome 4, 11, 12, 13, 14, and 17 abnormalities, cytogenetics with no metaphases Bone survey 08/15/2014 with indeterminant lucent skull lesions Cycle 1 RVD 08/22/2014 Cycle 2 RVD 09/19/2014 Cycle 3 RVD 10/17/2014 Cycle 4 RVD 11/14/2014 (Decadron reduced to 20 mg  weekly, Revlimid 15 mg days 1 through 14, Velcade 3/4 weeks) Cycle 5 RVD 12/12/2014 Serum M spike not detected 12/26/2014 Cycle 6 RVD 01/09/2015 Maintenance Revlimid, 10 mg daily, 02/05/2015 , discontinue 02/26/2015 secondary to leg edema and diarrhea Revlimid resumed at a dose of 10 mg every other day beginning 03/13/2015 PET scan 85/88/5027-XA hypermetabolic bone lesions, few lucent lesions in the spine and pelvis Revlimid discontinued January 2019 Enrollment on a clinical trial at [facility] with pomalidomide/Decadron +/- ixazomib beginning 03/10/2017 Treatment discontinued February 2021 secondary to rising serum lambda light chains PET 04/05/2019-hypermetabolic right axillary/chest wall nodes,  right paravertebral mass, small right pleural effusion,  retrocrural node, right lateral abdominal wall mass, L5 lesion mild compression deformity.  Focus of hypermetabolism at the anterior left pelvic wall without a CT correlate.  Hypermetabolic lytic lesion in the left T3 transverse process Cycle 1 daratumumab/carfilzomib/Decadron 04/05/2019 Radiation to the right chest wall mass and lumbar spine beginning 04/25/2019 Cycle 2 daratumumab/carfilzomib/Decadron 05/04/2019 (carfilzomib dose escalated); day 8 held due to neutropenia, thrombocytopenia; day 15 05/18/2019 (carfilzomib dose reduced) Daratumumab/carfilzomib/Decadron changed to every 2-week dosing beginning 05/25/2019 Daratumumab changed to a monthly schedule beginning 09/21/2019; carfilzomib every 2 weeks Daratumumab held 11/15/2019 secondary to thrombocytopenia, carfilzomib continued at a reduced dose CT chest 11/19/2019-T10 paraspinous mass enlarged compared to 09/13/2019, other bone lesions unchanged Daratumumab/carfilzomib/Decadron 12/14/2019 Daratumumab/carfilzomib/Decadron 12/27/2019 Daratumumab/carfilzomib/Decadron 01/11/2020 PET 01/24/2020-general improvement in previously noted bone lesions with sclerosis, improvement in chest adenopathy, enlargement of  right lower thoracic paraspinal/pleural mass and separate pleural tumor deposits on the right, increased right and  left pleural effusions, new small to moderate pericardial effusion, prepatellar bursitis/edema on the right, inflammatory activity at the left lateral calf Daratumumab/carfilzomib/Decadron 01/24/2020 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, lambda light chain restricted Palliative radiation to T10 and paraspinal mass, 10 fractions, 02/15/2020 through 02/28/2020 Bone marrow biopsy 04/10/2020-normocellular marrow with no increase in plasma cells, mild decrease in megakaryocytes on the bone marrow biopsy, no light chain restricted plasma cell population identified decreased iron stores, normal karyotype, myeloma FISH panel negative CT chest 07/04/2020-development of bulky right axillary adenopathy, progressive chest wall masses, destructive mass at the right side of T10, juxta cardiac node, right retrocrural node mild collapse/consolidative opacity in the dependent right lower lobe, right pleural effusion PET 3/00/7622-QJF hypermetabolic right axillary lymph nodes, an epicardial lymph node, and a retrocrural lymph node, marked soft tissue expansion involving right lower rib lesions, increased right pleural effusion, new bone lesions at the ischium and pubis, left T3 transverse process lesion 07/27/2020 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   History of intermittent prostatitis   4.   Skin rash 10/24/2014-referred to dermatology, biopsy consistent with local hypersensitivity reaction   5.   Urinary retention-most likely related to radiation toxicity, followed by urology, status post a Urolift  procedure 03/12/2015-improved   6.   Left lower leg cellulitis 06/24/2015-treated with Keflex   7.   History of mild thrombocytopenia secondary to myeloma and systemic therapy   8.   History of mild neutropenia- secondary to myeloma and systemic therapy   9.   Fall with a skull fracture and subarachnoid blood/right frontal lobe contusions 10/17/2015   10. Right lower lobe pulmonary embolus 03/28/2016. Lovenox , transitioned to Xarelto   11. Vesicular rash June 2018-potentially related to the zoster vaccine, resolved   12.  History of atrial fibrillation   13.  Aortic stenosis-TAVR 10/25/2019   14.  07/02/2019-hospital admission for sepsis secondary to cellulitis of the left lower extremity with gram-negative rod bacteremia (Aeromonas caviae)   15. Pleuritic chest pain - onset 10/2019, cardiac w/u negative   16. Right shoulder pain, possible bone spur on 04/2019 PET, likely MSK related (12/26/19)   17. Eczematous rash (12/26/19),  not felt to be drug rash/allergy. 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  [name] reviewed options to include supportive/comfort care with a hospice referral versus a trial of Bendamustine.  [name] elects supportive care with a hospice referral.  He is interested in palliative radiation to the right chest to alleviate pain.  We referred him for a noncontrast chest CT and made a referral to Dr. Tammi Klippel.  He will continue oxycodone as needed.  [name] discussed CODE STATUS.  [name] will be placed on NO CODE BLUE status.  He will return for follow-up 10/12/2020.  We are available to see him sooner if needed.  Patient seen with [name].    Ned Card ANP/GNP-BC   09/27/2020  3:49 PM  This was a shared visit with Ned Card.  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  I reviewed Dr. Phoebe Perch Mitchell from 09/25/2020 and he communicated with me via email.  [name] has  clinical evidence of disease progression.  The bispecific antibody has been discontinued.  He is symptomatic with increased pain and an overall decline in his performance status.  He has lost a significant amount of weight.  We discussed comfort care versus salvage therapy with Bendamustine.  He would like to proceed with comfort care.  This will potentially include palliative radiation to the chest.  He will be referred for a noncontrast chest CT to determine whether he is a candidate for additional palliative radiation.  I contacted Dr. Tammi Klippel and he will schedule an appointment for [name].  The plan is to make a referral for home hospice care at the completion of radiation.  We discussed CPR and ACLS.  He will be placed on a no CODE BLUE status.  [name] will continue oxycodone as needed for pain.  We will add a long-acting narcotic if he requires frequent oxycodone dosing.  I was present for greater than 50% of today's visit.  I performed medical decision making.  Julieanne Manson, MD

## 2020-09-28 ENCOUNTER — Ambulatory Visit
Admission: RE | Admit: 2020-09-28 | Discharge: 2020-09-28 | Disposition: A | Discharge status: Home / Self Care | Payer: Medicare PPO | Source: Ambulatory Visit | Attending: Radiation Oncology | Admitting: Radiation Oncology

## 2020-09-28 ENCOUNTER — Ambulatory Visit (HOSPITAL_BASED_OUTPATIENT_CLINIC_OR_DEPARTMENT_OTHER)
Admission: RE | Admit: 2020-09-28 | Discharge: 2020-09-28 | Disposition: A | Discharge status: Home / Self Care | Payer: Medicare PPO | Source: Ambulatory Visit | Attending: Nurse Practitioner | Admitting: Nurse Practitioner

## 2020-09-28 ENCOUNTER — Encounter: Payer: Self-pay | Admitting: Radiation Oncology

## 2020-09-28 VITALS — BP 95/58 | HR 90 | Temp 96.8°F | Resp 18 | Ht 71.0 in | Wt 158.0 lb

## 2020-09-28 DIAGNOSIS — Z923 Personal history of irradiation: Secondary | ICD-10-CM | POA: Diagnosis not present

## 2020-09-28 DIAGNOSIS — C61 Malignant neoplasm of prostate: Secondary | ICD-10-CM

## 2020-09-28 DIAGNOSIS — C7949 Secondary malignant neoplasm of other parts of nervous system: Secondary | ICD-10-CM | POA: Diagnosis not present

## 2020-09-28 DIAGNOSIS — I700 Atherosclerosis of aorta: Secondary | ICD-10-CM | POA: Diagnosis not present

## 2020-09-28 DIAGNOSIS — C9002 Multiple myeloma in relapse: Secondary | ICD-10-CM | POA: Diagnosis not present

## 2020-09-28 DIAGNOSIS — C9000 Multiple myeloma not having achieved remission: Secondary | ICD-10-CM | POA: Diagnosis not present

## 2020-09-28 DIAGNOSIS — J9811 Atelectasis: Secondary | ICD-10-CM | POA: Diagnosis not present

## 2020-09-28 DIAGNOSIS — I313 Pericardial effusion (noninflammatory): Secondary | ICD-10-CM | POA: Diagnosis not present

## 2020-09-28 DIAGNOSIS — G893 Neoplasm related pain (acute) (chronic): Secondary | ICD-10-CM | POA: Diagnosis not present

## 2020-09-28 NOTE — Progress Notes (Signed)
Radiation Oncology         [phone] ________________________________  Name: Mitchell Lowery MRN: [id number]  Date: 09/28/2020  DOB: [birth year]  Follow Up New  CC: [name], MD  Ladell Pier, MD  Diagnosis:  86 y.o. man with a painful lesion at the right lateral chest wall/rib secondary to his multiple myeloma  Interval Since Last Radiation:  7 months 02/15/20-02/28/20:   The spinal and paraspinal mass at right of T10 was treated to 20 Gy in 10 fractions of 2 Gy  04/25/19-05/09/19:   1.  The L5 lesion was treated to 20 Gy in 10 fractions of 2 Gy 2.  The right lateral rib lesion was treated to 20 Gy in 10 fractions of 2 Gy  07/21/2014:  Insertion of radioactive I-125 seeds into the prostate gland; 110 Gy, boost therapy.(Nairi Oswald/Dahlststedt)   05/17/2014-06/21/2014:  The prostate, seminal vesicles and pelvic lymph nodes were treated to 45 Gy in 25 fractions of 1.8 Gy  Narrative: Mitchell Lowery is a 86 y.o. male with a history of multiple myeloma and prostate cancer. He was last seen here for follow up in 03/2020 following treatment of a painful lesion at T10 which he tolerated well and has gotten good relief from. However, more recently, he has developed some persistent discomfort at the level of a previously treated right lateral 9th rib lesion.  His most recent restaging PET scan shows overall marked progression of myeloma with soft tissue/nodule progression as well as skeletal progression and specifically, marked soft tissue expansion surrounding multiple right lower rib lesions corresponding to the area that he is having pain.  In summary, he was treated for Gleason 4+5 prostate cancer with 5 weeks of EBRT followed by seed boost, completed in 06/2014. On preop labs for brachytherapy procedure, he was found to be pancytopenic. Bone marrow biopsy on 08/07/14 confirmed multiple myeloma. He was treated with 6 cycles of RVD followed by maintenance Revlimid through 01/2017 under [name].  He then enrolled in a clinical trial at [facility] on 03/10/17 with pomalidomide/Decadron/ixazomib under Dr. Amalia Hailey. This was discontinued in 02/2019 due to rising serum lambda light chains. Restaging PET on 04/05/19 showed widespread recurrent myeloma. He was started on daratumumab/carfilzomib/dexamthasone at that time. We met with him on 04/20/19 due to progressive, radiating low back pain. He subsequently received 10 fractions of IMRT to the L5 and right lateral rib lesions through 04/2019.  He had restaging PET on 01/24/20 showing an enlarging paraspinal/spinal mass at T10. We were contacted by [name], who recommended radiation to the area. His chemotherapy was also discontinued following 02/08/20 dose. He underwent biopsy of the mass on 02/20/20, and pathology confirmed plasma cell neoplasm. The mass was treated with 10 fractions of IMRT from 02/15/20 to 02/28/20. Repeat bone marrow biopsy on 04/10/20 showed no increase in plasma cells.  On restaging PET performed on 07/20/20 there was marked progression of multiple myeloma with soft tissue/nodal progression as well as skeletal progression-- with new hypermetabolic right axillary lymph nodes, epicardial lymph node, and enlarged retrocrural lymph node as well as marked soft tissue expansion into the pleural space associated with multiple right lower rib lesions and a mild increase in right pleural effusion. There was also new bony metastatic lesion noted in the left ischium at the pubic symphysis and left transverse process of the T3 vertebral body. He underwent biopsy of a right axillary lymph node on 07/27/20, which again confirmed plasma cell neoplasm. He was started on a different  clinical trial with TNB at [facility], under the care of Dr. Amalia Hailey, on 08/21/20. He received one cycle but continued to have progressive right chest wall pain, fatigue, exertional dyspnea, and decreased performance status, as well as rising serum free light chains despite treatment.  Today on  review of systems, he reports his pain is 3/10, as long as he is taking oxycodone 4 times a day. He reports the pain fluctuates and is sometimes worse, particularly with any activity.  He has decreased appetite and admits that he is not eating or drinking very much at all.  He also reports significant fatigue/malaise.  He has been kindly referred back to [place] for consideration of additional palliative radiation therapy to the right chest wall.   ALLERGIES:  is allergic to bee venom.  Meds: Current Outpatient Medications  Medication Sig Dispense Refill   acetaminophen (TYLENOL) 500 MG tablet Take 1,000 mg by mouth every 6 (six) hours as needed for moderate pain.     amoxicillin (AMOXIL) 500 MG tablet Take 4 tablets (2,000 mg) one hour prior to all dental visits. 8 tablet 12   Cholecalciferol 2000 units TABS Take 2,000 Units by mouth every evening.      docusate sodium (COLACE) 100 MG capsule Take 100 mg by mouth daily as needed for mild constipation.     furosemide (LASIX) 20 MG tablet Take 1 tablet (20 mg total) by mouth daily. 30 tablet 11   loperamide (IMODIUM) 2 MG capsule Take 2 mg by mouth 4 (four) times daily as needed for diarrhea or loose stools.     losartan (COZAAR) 50 MG tablet Take 50 mg by mouth every evening.      melatonin 5 MG TABS Take 5 mg by mouth at bedtime as needed (sleep).     METAMUCIL FIBER PO Take 1 Scoop by mouth daily as needed (constipation).     metoprolol succinate (TOPROL XL) 25 MG 24 hr tablet Take 0.5 tablets (12.5 mg total) by mouth daily. 45 tablet 3   oxyCODONE (OXY IR/ROXICODONE) 5 MG immediate release tablet Take 0.5-1 tablets (2.5-5 mg total) by mouth every 4 (four) hours as needed for severe pain. 60 tablet 0   rivaroxaban (XARELTO) 20 MG TABS tablet TAKE 1 TABLET BY MOUTH DAILY WITH SUPPER. (Patient taking differently: Take 20 mg by mouth daily with supper.) 30 tablet 11   valACYclovir (VALTREX) 500 MG tablet Take 500 mg by mouth every evening.       vitamin B-12 (CYANOCOBALAMIN) 500 MCG tablet Take 500 mcg by mouth every evening.      polyethylene glycol (MIRALAX / GLYCOLAX) 17 g packet Take by mouth daily as needed for moderate constipation. 1 measured teaspoonful daily as needed for constipation (Patient not taking: No sig reported)     No current facility-administered medications for this encounter.    Physical Findings:  height is _0  (1.803 m) and weight is 158 lb (71.7 kg). His temporal temperature is 96.8 F (36 C) (abnormal). His blood pressure is 95/58 (abnormal) and his pulse is 90. His respiration is 18 and oxygen saturation is 96%.  Pain Assessment Pain Score: 4  Pain Frequency: Constant Pain Loc: Chest  In general this is a well appearing Caucasian male in no acute distress. He's alert and oriented x4 and appropriate throughout the examination. Cardiopulmonary assessment is negative for acute distress and he exhibits normal effort.    Lab Findings: Lab Results  Component Value Date   WBC 3.9 (L) 07/27/2020  HGB 12.2 (L) 07/27/2020   HCT 38.5 (L) 07/27/2020   MCV 90.2 07/27/2020   PLT 65 (L) 07/27/2020     Radiographic Findings: No results found.   Impression/Plan: 22. 86 yo man with a painful lesion at the right lateral chest wall/rib secondary to his multiple myeloma. Today, we talked to the patient and his wife about the findings and workup thus far. We discussed the natural history of multiple myeloma and general treatment, highlighting the role of radiotherapy in the management of painful lesions. We discussed the available radiation techniques, and focused on the details and logistics of delivery. We reviewed the anticipated acute and late sequelae associated with re-irradiation of this right lateral rib lesion. The patient was encouraged to ask questions that were answered to his stated satisfaction.  At the conclusion of our conversation, the patient elects to proceed with reirradiation to the right  chest wall for palliation of his pain.  He has freely signed written consent to proceed today in the office and a copy of this document will be placed in his medical record.  He is tentatively scheduled for CT simulation at 7:30 AM on 10/02/2020, in anticipation of beginning a 1 - 2 week course of daily palliative radiotherapy to the previously treated but progressive disease in the right chest wall, on Wednesday, 10/03/2020.  We will share our discussion with [name] and proceed with treatment planning accordingly.  We enjoyed meeting with him today and look forward to continuing to participate in his care.  He and his wife know that they are welcome to call at anytime in the interim with any questions or concerns related to the recommended treatment.  2. Hypotension.  His blood pressure is quite low today in the office at 95/58.  He reports that he has continued taking his blood pressure medications as prescribed but admits that he is not eating or drinking very much at all.  We discussed monitoring his blood pressure at home regularly and holding his blood pressure medications for blood pressure readings below 110/70.  We also discussed the importance of hydration and he will make an effort to increase his fluid intake and will addend some Gatorade/Powerade to help with his electrolytes.  I also encouraged him to try eating some chicken noodle soup for the added sodium that will help his body to hold onto the fluids that he is getting.  If his blood pressure remains low and/or he becomes symptomatic despite these efforts, he will contact his PCP or his cardiologist for further recommendations.     Nicholos Johns, PA-C    Tyler Pita, MD  [facility] Direct Dial: [phone]  Fax: [phone] [email or web address]  Skype  [email or web address]   This document serves as a record of services personally performed by Tyler Pita, MD and Freeman Caldron, PA-C. It was created on their  behalf by Wilburn Mylar, a trained medical scribe. The creation of this record is based on the scribe's personal observations and the provider's statements to them. This document has been checked and approved by the attending provider.

## 2020-09-28 NOTE — Progress Notes (Signed)
Patient in for possible treatment to right chest wall for painful lesion. States today his pain is 3/10 and is taking oxycodone 4 times per day. Some days the pain is worse. Has had radiation to the prostate in the past. Wife is with the patient.

## 2020-10-02 ENCOUNTER — Ambulatory Visit
Admission: RE | Admit: 2020-10-02 | Discharge: 2020-10-02 | Disposition: A | Discharge status: Home / Self Care | Payer: Medicare PPO | Source: Ambulatory Visit | Attending: Radiation Oncology | Admitting: Radiation Oncology

## 2020-10-02 ENCOUNTER — Other Ambulatory Visit: Payer: Self-pay

## 2020-10-02 DIAGNOSIS — Z510 Encounter for antineoplastic radiation therapy: Secondary | ICD-10-CM | POA: Diagnosis not present

## 2020-10-02 DIAGNOSIS — Z923 Personal history of irradiation: Secondary | ICD-10-CM | POA: Diagnosis not present

## 2020-10-02 DIAGNOSIS — C9002 Multiple myeloma in relapse: Secondary | ICD-10-CM | POA: Diagnosis not present

## 2020-10-02 DIAGNOSIS — C7949 Secondary malignant neoplasm of other parts of nervous system: Secondary | ICD-10-CM | POA: Diagnosis not present

## 2020-10-02 DIAGNOSIS — G893 Neoplasm related pain (acute) (chronic): Secondary | ICD-10-CM | POA: Diagnosis not present

## 2020-10-02 NOTE — Progress Notes (Signed)
  Radiation Oncology         (336) [phone] ________________________________  Name: MURPHY DUZAN MRN: [id number]  Date: 10/02/2020  DOB: Apr 22, 1934  SIMULATION AND TREATMENT PLANNING NOTE    ICD-10-CM   1. Multiple myeloma in relapse ([place])  C90.02       DIAGNOSIS:  86 y.o. man with a painful lesion at the right lateral chest wall/rib secondary to his multiple myeloma  NARRATIVE:  The patient was brought to the [facility].  Identity was confirmed.  All relevant records and images related to the planned course of therapy were reviewed.  The patient freely provided informed written consent to proceed with treatment after reviewing the details related to the planned course of therapy. The consent form was witnessed and verified by the simulation staff.  Then, the patient was set-up in a stable reproducible  supine position for radiation therapy.  CT images were obtained.  Surface markings were placed.  The CT images were loaded into the planning software.  Then the target and avoidance structures were contoured.  Treatment planning then occurred.  The radiation prescription was entered and confirmed.  Then, I designed and supervised the construction of a total of medically necessary complex treatment devices specified in the planning note and radiation plan.  I have requested : 3D Simulation  I have requested a DVH of the following structures: Liver, spinal cord, right lung and skin.  SPECIAL TREATMENT PROCEDURE:  The planned course of therapy using radiation constitutes a special treatment procedure. Special care is required in the management of this patient for the following reasons. This treatment constitutes a Special Treatment Procedure for the following reason: [ Retreatment in a previously radiated area requiring careful monitoring of increased risk of toxicity due to overlap of previous treatment..  The special nature of the planned course of radiotherapy will require increased  physician supervision and oversight to ensure patient's safety with optimal treatment outcomes.  This will require extended time and effort from me.  PLAN:  The patient will receive 20 Gy in 5 fractions.  ________________________________  Sheral Apley Tammi Klippel, M.D.

## 2020-10-03 ENCOUNTER — Ambulatory Visit
Admission: RE | Admit: 2020-10-03 | Discharge: 2020-10-03 | Disposition: A | Discharge status: Home / Self Care | Payer: Medicare PPO | Source: Ambulatory Visit | Attending: Radiation Oncology | Admitting: Radiation Oncology

## 2020-10-03 DIAGNOSIS — C9002 Multiple myeloma in relapse: Secondary | ICD-10-CM | POA: Diagnosis not present

## 2020-10-03 DIAGNOSIS — C7949 Secondary malignant neoplasm of other parts of nervous system: Secondary | ICD-10-CM | POA: Diagnosis not present

## 2020-10-03 DIAGNOSIS — Z923 Personal history of irradiation: Secondary | ICD-10-CM | POA: Diagnosis not present

## 2020-10-03 DIAGNOSIS — Z510 Encounter for antineoplastic radiation therapy: Secondary | ICD-10-CM | POA: Diagnosis not present

## 2020-10-03 DIAGNOSIS — G893 Neoplasm related pain (acute) (chronic): Secondary | ICD-10-CM | POA: Diagnosis not present

## 2020-10-04 ENCOUNTER — Other Ambulatory Visit: Payer: Self-pay

## 2020-10-04 ENCOUNTER — Ambulatory Visit
Admission: RE | Admit: 2020-10-04 | Discharge: 2020-10-04 | Disposition: A | Discharge status: Home / Self Care | Payer: Medicare PPO | Source: Ambulatory Visit | Attending: Radiation Oncology | Admitting: Radiation Oncology

## 2020-10-04 ENCOUNTER — Other Ambulatory Visit: Payer: Self-pay | Admitting: Oncology

## 2020-10-04 DIAGNOSIS — C7949 Secondary malignant neoplasm of other parts of nervous system: Secondary | ICD-10-CM | POA: Diagnosis not present

## 2020-10-04 DIAGNOSIS — C9002 Multiple myeloma in relapse: Secondary | ICD-10-CM | POA: Diagnosis not present

## 2020-10-04 DIAGNOSIS — G893 Neoplasm related pain (acute) (chronic): Secondary | ICD-10-CM | POA: Diagnosis not present

## 2020-10-04 DIAGNOSIS — C9000 Multiple myeloma not having achieved remission: Secondary | ICD-10-CM

## 2020-10-04 DIAGNOSIS — Z923 Personal history of irradiation: Secondary | ICD-10-CM | POA: Diagnosis not present

## 2020-10-04 DIAGNOSIS — Z510 Encounter for antineoplastic radiation therapy: Secondary | ICD-10-CM | POA: Diagnosis not present

## 2020-10-04 MED ORDER — OXYCODONE HCL 5 MG PO TABS: 2.5000 mg | ORAL_TABLET | ORAL | 0 refills | Status: DC | PRN

## 2020-10-04 MED ORDER — PREDNISONE 5 MG PO TABS: 10.0000 mg | ORAL_TABLET | Freq: Every day | ORAL | 0 refills | Status: DC

## 2020-10-05 ENCOUNTER — Ambulatory Visit
Admission: RE | Admit: 2020-10-05 | Discharge: 2020-10-05 | Disposition: A | Discharge status: Home / Self Care | Payer: Medicare PPO | Source: Ambulatory Visit | Attending: Radiation Oncology | Admitting: Radiation Oncology

## 2020-10-05 DIAGNOSIS — C7949 Secondary malignant neoplasm of other parts of nervous system: Secondary | ICD-10-CM | POA: Diagnosis not present

## 2020-10-05 DIAGNOSIS — G893 Neoplasm related pain (acute) (chronic): Secondary | ICD-10-CM | POA: Diagnosis not present

## 2020-10-05 DIAGNOSIS — C9002 Multiple myeloma in relapse: Secondary | ICD-10-CM | POA: Diagnosis not present

## 2020-10-05 DIAGNOSIS — Z510 Encounter for antineoplastic radiation therapy: Secondary | ICD-10-CM | POA: Diagnosis not present

## 2020-10-05 DIAGNOSIS — Z923 Personal history of irradiation: Secondary | ICD-10-CM | POA: Diagnosis not present

## 2020-10-08 ENCOUNTER — Other Ambulatory Visit: Payer: Self-pay

## 2020-10-08 ENCOUNTER — Ambulatory Visit
Admission: RE | Admit: 2020-10-08 | Discharge: 2020-10-08 | Disposition: A | Discharge status: Home / Self Care | Payer: Medicare PPO | Source: Ambulatory Visit | Attending: Radiation Oncology | Admitting: Radiation Oncology

## 2020-10-08 ENCOUNTER — Encounter: Payer: Self-pay | Admitting: Urology

## 2020-10-08 DIAGNOSIS — C7949 Secondary malignant neoplasm of other parts of nervous system: Secondary | ICD-10-CM | POA: Diagnosis not present

## 2020-10-08 DIAGNOSIS — C9002 Multiple myeloma in relapse: Secondary | ICD-10-CM | POA: Diagnosis not present

## 2020-10-08 DIAGNOSIS — Z923 Personal history of irradiation: Secondary | ICD-10-CM | POA: Diagnosis not present

## 2020-10-08 DIAGNOSIS — Z510 Encounter for antineoplastic radiation therapy: Secondary | ICD-10-CM | POA: Diagnosis not present

## 2020-10-08 DIAGNOSIS — G893 Neoplasm related pain (acute) (chronic): Secondary | ICD-10-CM | POA: Diagnosis not present

## 2020-10-12 ENCOUNTER — Other Ambulatory Visit: Payer: Self-pay

## 2020-10-12 ENCOUNTER — Inpatient Hospital Stay (HOSPITAL_BASED_OUTPATIENT_CLINIC_OR_DEPARTMENT_OTHER): Payer: Medicare PPO | Admitting: Oncology

## 2020-10-12 VITALS — BP 115/60 | HR 101 | Temp 98.3°F | Resp 16 | Wt 152.4 lb

## 2020-10-12 DIAGNOSIS — I4891 Unspecified atrial fibrillation: Secondary | ICD-10-CM | POA: Diagnosis not present

## 2020-10-12 DIAGNOSIS — Z923 Personal history of irradiation: Secondary | ICD-10-CM | POA: Diagnosis not present

## 2020-10-12 DIAGNOSIS — Z9221 Personal history of antineoplastic chemotherapy: Secondary | ICD-10-CM | POA: Diagnosis not present

## 2020-10-12 DIAGNOSIS — C9000 Multiple myeloma not having achieved remission: Secondary | ICD-10-CM | POA: Diagnosis not present

## 2020-10-12 DIAGNOSIS — Z86711 Personal history of pulmonary embolism: Secondary | ICD-10-CM | POA: Diagnosis not present

## 2020-10-12 DIAGNOSIS — Z7901 Long term (current) use of anticoagulants: Secondary | ICD-10-CM | POA: Diagnosis not present

## 2020-10-12 NOTE — Progress Notes (Signed)
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  He completed a course of palliative radiation to the right chest between 10/02/2020 and 10/08/2020.  He reports improvement in pain following the radiation.  However he has developed increased malaise and exertional dyspnea.  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  He is taking oxycodone every 4 hours.  He has oxygen in the home to use as needed.  Objective:  Vital signs in last 24 hours:  Blood pressure 115/60, pulse (!) 101, temperature 98.3 F (36.8 C), temperature source Tympanic, resp. rate 16, weight 152 lb 6.4 oz (69.1 kg), SpO2 97 %.    Resp: Diminished breath sounds throughout the right chest, no respiratory distress at rest Cardio: Regular rate and rhythm Vascular: No leg edema Neuro: Alert and oriented    Lab Results:  Lab Results  Component Value Date   WBC 3.9 (L) 07/27/2020   HGB 12.2 (L) 07/27/2020   HCT 38.5 (L) 07/27/2020   MCV 90.2 07/27/2020   PLT 65 (L) 07/27/2020   NEUTROABS 3.0 06/27/2020    CMP  Lab Results  Component Value Date   NA 140 06/27/2020   K 4.1 06/27/2020   CL 103 06/27/2020   CO2 29 06/27/2020   GLUCOSE 69 (L) 06/27/2020   BUN 15 06/27/2020   CREATININE 0.74 06/27/2020   CALCIUM 8.8 (L) 06/27/2020   PROT 5.5 (L) 06/27/2020   ALBUMIN 3.7 06/27/2020   AST 16 06/27/2020   ALT 12 06/27/2020   ALKPHOS 39 06/27/2020   BILITOT 0.5 06/27/2020   GFRNONAA >60 06/27/2020   GFRAA >60 10/26/2019     Medications: I have reviewed the patient's current medications.   Assessment/Plan: Multiple myeloma-confirmed on a bone marrow biopsy 08/07/2014, IgA lambda Serum M spike and increased serum free lambda light chains Myeloma FISH panel negative for chromosome 4, 11, 12, 13, 14, and 17 abnormalities, cytogenetics with no metaphases Bone survey 08/15/2014  with indeterminant lucent skull lesions Cycle 1 RVD 08/22/2014 Cycle 2 RVD 09/19/2014 Cycle 3 RVD 10/17/2014 Cycle 4 RVD 11/14/2014 (Decadron reduced to 20 mg weekly, Revlimid 15 mg days 1 through 14, Velcade 3/4 weeks) Cycle 5 RVD 12/12/2014 Serum M spike not detected 12/26/2014 Cycle 6 RVD 01/09/2015 Maintenance Revlimid, 10 mg daily, 02/05/2015 , discontinue 02/26/2015 secondary to leg edema and diarrhea Revlimid resumed at a dose of 10 mg every other day beginning 03/13/2015 PET scan 99/69/2493-SU hypermetabolic bone lesions, few lucent lesions in the spine and pelvis Revlimid discontinued January 2019 Enrollment on a clinical trial at [facility] with pomalidomide/Decadron +/- ixazomib beginning 03/10/2017 Treatment discontinued February 2021 secondary to rising serum lambda light chains PET 04/05/2019-hypermetabolic right axillary/chest wall nodes,  right paravertebral mass, small right pleural effusion,  retrocrural node, right lateral abdominal wall mass, L5 lesion mild compression deformity.  Focus of hypermetabolism at the anterior left pelvic wall without a CT correlate.  Hypermetabolic lytic lesion in the left T3 transverse process Cycle 1 daratumumab/carfilzomib/Decadron 04/05/2019 Radiation to the right chest wall mass and lumbar spine beginning 04/25/2019 Cycle 2 daratumumab/carfilzomib/Decadron 05/04/2019 (carfilzomib dose escalated); day 8 held due to neutropenia, thrombocytopenia; day 15 05/18/2019 (carfilzomib dose reduced) Daratumumab/carfilzomib/Decadron changed to every 2-week dosing beginning 05/25/2019 Daratumumab changed to a monthly schedule beginning 09/21/2019; carfilzomib every 2 weeks Daratumumab held 11/15/2019 secondary to thrombocytopenia, carfilzomib continued at a reduced dose CT chest  11/19/2019-T10 paraspinous mass enlarged compared to 09/13/2019, other bone lesions unchanged Daratumumab/carfilzomib/Decadron 12/14/2019 Daratumumab/carfilzomib/Decadron  12/27/2019 Daratumumab/carfilzomib/Decadron 01/11/2020 PET 01/24/2020-general improvement in previously noted bone lesions with sclerosis, improvement in chest adenopathy, enlargement of right lower thoracic paraspinal/pleural mass and separate pleural tumor deposits on the right, increased right and left pleural effusions, new small to moderate pericardial effusion, prepatellar bursitis/edema on the right, inflammatory activity at the left lateral calf Daratumumab/carfilzomib/Decadron 01/24/2020 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, lambda light chain restricted Palliative radiation to T10 and paraspinal mass, 10 fractions, 02/15/2020 through 02/28/2020 Bone marrow biopsy 04/10/2020-normocellular marrow with no increase in plasma cells, mild decrease in megakaryocytes on the bone marrow biopsy, no light chain restricted plasma cell population identified decreased iron stores, normal karyotype, myeloma FISH panel negative CT chest 07/04/2020-development of bulky right axillary adenopathy, progressive chest wall masses, destructive mass at the right side of T10, juxta cardiac node, right retrocrural node mild collapse/consolidative opacity in the dependent right lower lobe, right pleural effusion PET 2/99/3716-RCV hypermetabolic right axillary lymph nodes, an epicardial lymph node, and a retrocrural lymph node, marked soft tissue expansion involving right lower rib lesions, increased right pleural effusion, new bone lesions at the ischium and pubis, left T3 transverse process lesion 07/27/2020 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   History of intermittent prostatitis   4.   Skin rash 10/24/2014-referred to dermatology, biopsy consistent with local hypersensitivity reaction   5.   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   Left lower leg cellulitis 06/24/2015-treated with Keflex   7.   History of mild thrombocytopenia secondary to myeloma and systemic therapy   8.   History of mild neutropenia- secondary to myeloma and systemic therapy   9.   Fall with a skull fracture and subarachnoid blood/right frontal lobe contusions 10/17/2015   10. Right lower lobe pulmonary embolus 03/28/2016. Lovenox , transitioned to Xarelto   11. Vesicular rash June 2018-potentially related to the zoster vaccine, resolved   12.  History of atrial fibrillation   13.  Aortic stenosis-TAVR 10/25/2019   14.  07/02/2019-hospital admission for sepsis secondary to cellulitis of the left lower extremity with gram-negative rod bacteremia (Aeromonas caviae)   15. Pleuritic chest pain - onset 10/2019, cardiac w/u negative   16. Right shoulder pain, possible bone spur on 04/2019 PET, likely MSK related (12/26/19)   17. Eczematous rash (12/26/19),  not felt to be drug rash/allergy. 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  He has enrolled in hospice care.  He completed a course of palliative radiation to the right chest wall.  He has experienced improvement in pain, but he has increased malaise.  He also has increased exertional dyspnea.  The dyspnea is likely related to tumor involving the right chest including a large right pleural effusion.  We discussed the indication for a  palliative thoracentesis.  We decided to hold on  the thoracentesis for now.  He will use oxygen as needed.  He will call for increased dyspnea and we will obtain a chest x-ray and consider a therapeutic thoracentesis.  He will continue the current narcotic pain regimen.  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  He will call in the interim as needed.  He will continue follow-up with the home hospice team.  We discussed the risk versus benefit of continuing anticoagulation therapy.  He will discontinue Xarelto.  He will also discontinue vitamin D, Val acyclovir, vitamin B12, furosemide, and losartan.  [name], MD  10/12/2020  4:07 PM

## 2020-10-16 ENCOUNTER — Telehealth (HOSPITAL_COMMUNITY): Payer: Self-pay | Admitting: Cardiovascular Disease

## 2020-10-16 ENCOUNTER — Telehealth: Payer: Self-pay | Admitting: Cardiovascular Disease

## 2020-10-16 NOTE — Telephone Encounter (Signed)
Echocardiogram was cancelled for reason below:  10/16/2020 10:44 AM CS:PZZC, JENNIFER L  Cancel Rsn: Patient (on [place])  Order will be removed from the Echo WQ.

## 2020-10-16 NOTE — Telephone Encounter (Signed)
thx

## 2020-10-16 NOTE — Telephone Encounter (Signed)
Wife of the patient called. 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 She asked that I cancel his upcoming appointment with Angelena Form .  She also said that the patient's Oncologist will be reaching out to Dr. Burt Knack regarding the patient.

## 2020-10-16 NOTE — Telephone Encounter (Signed)
Thank you for letting us know. No need to come in for follow up as pt is on hospice care. Wishing him and his family all the best. Will forward to Dr. Burt Knack.

## 2020-10-24 ENCOUNTER — Ambulatory Visit: Payer: Medicare PPO | Admitting: Physician Assistant

## 2020-10-24 ENCOUNTER — Other Ambulatory Visit (HOSPITAL_COMMUNITY): Payer: Medicare PPO

## 2020-10-31 ENCOUNTER — Other Ambulatory Visit: Payer: Self-pay

## 2020-10-31 ENCOUNTER — Inpatient Hospital Stay: Attending: Oncology | Admitting: Oncology

## 2020-10-31 DIAGNOSIS — Z923 Personal history of irradiation: Secondary | ICD-10-CM | POA: Diagnosis not present

## 2020-10-31 DIAGNOSIS — C9000 Multiple myeloma not having achieved remission: Secondary | ICD-10-CM | POA: Diagnosis not present

## 2020-10-31 MED ORDER — OXYCODONE HCL 5 MG PO TABS: 2.5000 mg | ORAL_TABLET | ORAL | 0 refills | Status: DC | PRN

## 2020-10-31 NOTE — Progress Notes (Signed)
[place] OFFICE PROGRESS NOTE   Diagnosis: Multiple myeloma  INTERVAL HISTORY:   Mitchell Lowery returns as scheduled.  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  He has enrolled in hospice care.  The hospice nurses visiting weekly.  He reports significant improvement in chest pain since completing palliative radiation 10/08/2020.  He takes oxycodone infrequently.  He reports intermittent constipation and diarrhea.  He has malaise, anorexia, and exertional dyspnea.  He has home oxygen, but is not sure whether this has helped.  Objective:  Vital signs in last 24 hours:  Blood pressure 105/63, pulse 96, temperature 97.9 F (36.6 C), temperature source Oral, resp. rate 18, height '5\' 11"'  (1.803 m), weight 145 lb (65.8 kg), SpO2 100 %.    Resp: Decreased breath sounds at the right lower chest, no respiratory distress Cardio: Regular rate and rhythm Vascular: No leg edema   Lab Results:  Lab Results  Component Value Date   WBC 3.9 (L) 07/27/2020   HGB 12.2 (L) 07/27/2020   HCT 38.5 (L) 07/27/2020   MCV 90.2 07/27/2020   PLT 65 (L) 07/27/2020   NEUTROABS 3.0 06/27/2020    CMP  Lab Results  Component Value Date   NA 140 06/27/2020   K 4.1 06/27/2020   CL 103 06/27/2020   CO2 29 06/27/2020   GLUCOSE 69 (L) 06/27/2020   BUN 15 06/27/2020   CREATININE 0.74 06/27/2020   CALCIUM 8.8 (L) 06/27/2020   PROT 5.5 (L) 06/27/2020   ALBUMIN 3.7 06/27/2020   AST 16 06/27/2020   ALT 12 06/27/2020   ALKPHOS 39 06/27/2020   BILITOT 0.5 06/27/2020   GFRNONAA >60 06/27/2020   GFRAA >60 10/26/2019    Medications: I have reviewed the patient's current medications.   Assessment/Plan: Multiple myeloma-confirmed on a bone marrow biopsy 08/07/2014, IgA lambda Serum M spike and increased serum free lambda light chains Myeloma FISH panel negative for chromosome 4, 11, 12, 13, 14, and 17 abnormalities, cytogenetics with no metaphases Bone survey 08/15/2014 with indeterminant  lucent skull lesions Cycle 1 RVD 08/22/2014 Cycle 2 RVD 09/19/2014 Cycle 3 RVD 10/17/2014 Cycle 4 RVD 11/14/2014 (Decadron reduced to 20 mg weekly, Revlimid 15 mg days 1 through 14, Velcade 3/4 weeks) Cycle 5 RVD 12/12/2014 Serum M spike not detected 12/26/2014 Cycle 6 RVD 01/09/2015 Maintenance Revlimid, 10 mg daily, 02/05/2015 , discontinue 02/26/2015 secondary to leg edema and diarrhea Revlimid resumed at a dose of 10 mg every other day beginning 03/13/2015 PET scan 59/56/3875-IE hypermetabolic bone lesions, few lucent lesions in the spine and pelvis Revlimid discontinued January 2019 Enrollment on a clinical trial at [facility] with pomalidomide/Decadron +/- ixazomib beginning 03/10/2017 Treatment discontinued February 2021 secondary to rising serum lambda light chains PET 04/05/2019-hypermetabolic right axillary/chest wall nodes,  right paravertebral mass, small right pleural effusion,  retrocrural node, right lateral abdominal wall mass, L5 lesion mild compression deformity.  Focus of hypermetabolism at the anterior left pelvic wall without a CT correlate.  Hypermetabolic lytic lesion in the left T3 transverse process Cycle 1 daratumumab/carfilzomib/Decadron 04/05/2019 Radiation to the right chest wall mass and lumbar spine beginning 04/25/2019 Cycle 2 daratumumab/carfilzomib/Decadron 05/04/2019 (carfilzomib dose escalated); day 8 held due to neutropenia, thrombocytopenia; day 15 05/18/2019 (carfilzomib dose reduced) Daratumumab/carfilzomib/Decadron changed to every 2-week dosing beginning 05/25/2019 Daratumumab changed to a monthly schedule beginning 09/21/2019; carfilzomib every 2 weeks Daratumumab held 11/15/2019 secondary to thrombocytopenia, carfilzomib continued at a reduced dose CT chest 11/19/2019-T10 paraspinous mass enlarged compared to 09/13/2019, other bone lesions  unchanged Daratumumab/carfilzomib/Decadron 12/14/2019 Daratumumab/carfilzomib/Decadron 12/27/2019 Daratumumab/carfilzomib/Decadron  01/11/2020 PET 01/24/2020-general improvement in previously noted bone lesions with sclerosis, improvement in chest adenopathy, enlargement of right lower thoracic paraspinal/pleural mass and separate pleural tumor deposits on the right, increased right and left pleural effusions, new small to moderate pericardial effusion, prepatellar bursitis/edema on the right, inflammatory activity at the left lateral calf Daratumumab/carfilzomib/Decadron 01/24/2020 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, lambda light chain restricted Palliative radiation to T10 and paraspinal mass, 10 fractions, 02/15/2020 through 02/28/2020 Bone marrow biopsy 04/10/2020-normocellular marrow with no increase in plasma cells, mild decrease in megakaryocytes on the bone marrow biopsy, no light chain restricted plasma cell population identified decreased iron stores, normal karyotype, myeloma FISH panel negative CT chest 07/04/2020-development of bulky right axillary adenopathy, progressive chest wall masses, destructive mass at the right side of T10, juxta cardiac node, right retrocrural node mild collapse/consolidative opacity in the dependent right lower lobe, right pleural effusion PET 7/41/2878-MVE hypermetabolic right axillary lymph nodes, an epicardial lymph node, and a retrocrural lymph node, marked soft tissue expansion involving right lower rib lesions, increased right pleural effusion, new bone lesions at the ischium and pubis, left T3 transverse process lesion 07/27/2020 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   History of intermittent prostatitis   4.   Skin rash 10/24/2014-referred to dermatology, biopsy consistent with local hypersensitivity reaction   5.   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   Left lower leg cellulitis 06/24/2015-treated with Keflex   7.   History of mild thrombocytopenia secondary to myeloma and systemic therapy   8.   History of mild neutropenia- secondary to myeloma and systemic therapy   9.   Fall with a skull fracture and subarachnoid blood/right frontal lobe contusions 10/17/2015   10. Right lower lobe pulmonary embolus 03/28/2016. Lovenox , transitioned to Xarelto   11. Vesicular rash June 2018-potentially related to the zoster vaccine, resolved   12.  History of atrial fibrillation   13.  Aortic stenosis-TAVR 10/25/2019   14.  07/02/2019-hospital admission for sepsis secondary to cellulitis of the left lower extremity with gram-negative rod bacteremia (Aeromonas caviae)   15. Pleuritic chest pain - onset 10/2019, cardiac w/u negative   16. Right shoulder pain, possible bone spur on 04/2019 PET, likely MSK related (12/26/19)   17. Eczematous rash (12/26/19),  not felt to be drug rash/allergy. Recommended hydrocortisone topical PRN        Disposition: Mitchell Lowery has refractory multiple myeloma.  He is enrolled in hospice care.  He has a right pleural effusion.  He is symptomatic with exertional dyspnea.  We discussed the indication for a palliative therapeutic thoracentesis.  He would like to wait on this for now.  He will call for increased dyspnea.  Mitchell Lowery would like to continue follow-up with Cancer center.  He will return for an office visit in 2 weeks.  He will continue oxycodone as needed for pain.  He will use a  stool  softener for constipation.  [name], MD  10/31/2020  12:37 PM

## 2020-11-13 ENCOUNTER — Encounter: Payer: Self-pay | Admitting: Urology

## 2020-11-13 NOTE — Progress Notes (Signed)
Patient's spouse Mitchell Lowery cleared to speak w/ me about patient's current status. She reports that [name] has some fatigue, and bilateral hip pain 4/10. No other symptoms reported at this time.  Meaningful use complete.  Spouse notified of patient's 10:00am-11/15/20 telephone appointment and verbalized understanding.  Please call spouse Mitchell Lowery @ [phone] for 11/15/20-telephone appointment.

## 2020-11-14 ENCOUNTER — Inpatient Hospital Stay (HOSPITAL_BASED_OUTPATIENT_CLINIC_OR_DEPARTMENT_OTHER): Admitting: Nurse Practitioner

## 2020-11-14 ENCOUNTER — Encounter: Payer: Self-pay | Admitting: Nurse Practitioner

## 2020-11-14 ENCOUNTER — Other Ambulatory Visit: Payer: Self-pay

## 2020-11-14 VITALS — BP 103/64 | HR 88 | Temp 98.1°F | Resp 20 | Ht 71.0 in | Wt 156.0 lb

## 2020-11-14 DIAGNOSIS — C9000 Multiple myeloma not having achieved remission: Secondary | ICD-10-CM | POA: Diagnosis not present

## 2020-11-14 DIAGNOSIS — Z923 Personal history of irradiation: Secondary | ICD-10-CM | POA: Diagnosis not present

## 2020-11-14 NOTE — Progress Notes (Signed)
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  He reports developing constipation 10 days ago.  He took laxatives and then developed diarrhea.  Appetite overall is good.  He feels fluid intake is adequate.  He began dexamethasone for hip pain, notes improvement.  Stable dyspnea.  Objective:  Vital signs in last 24 hours:  Blood pressure 103/64, pulse 88, temperature 98.1 F (36.7 C), temperature source Oral, resp. rate 20, height _0  (1.803 m), weight 156 lb (70.8 kg), SpO2 98 %.    HEENT: Mouth is dry appearing. Lymphatics: Large lymph node right axilla. Resp: Diminished breath sounds right lower lung field.  No respiratory distress. Cardio: Regular rate and rhythm. GI: Abdomen soft and nontender.  No hepatosplenomegaly. Vascular: No leg edema. Skin: Scab left lower outer leg without surrounding erythema.   Lab Results:  Lab Results  Component Value Date   WBC 3.9 (L) 07/27/2020   HGB 12.2 (L) 07/27/2020   HCT 38.5 (L) 07/27/2020   MCV 90.2 07/27/2020   PLT 65 (L) 07/27/2020   NEUTROABS 3.0 06/27/2020    Imaging:  No results found.  Medications: I have reviewed the patient's current medications.  Assessment/Plan: Multiple myeloma-confirmed on a bone marrow biopsy 08/07/2014, IgA lambda Serum M spike and increased serum free lambda light chains Myeloma FISH panel negative for chromosome 4, 11, 12, 13, 14, and 17 abnormalities, cytogenetics with no metaphases Bone survey 08/15/2014 with indeterminant lucent skull lesions Cycle 1 RVD 08/22/2014 Cycle 2 RVD 09/19/2014 Cycle 3 RVD 10/17/2014 Cycle 4 RVD 11/14/2014 (Decadron reduced to 20 mg weekly, Revlimid 15 mg days 1 through 14, Velcade 3/4 weeks) Cycle 5 RVD 12/12/2014 Serum M spike not detected 12/26/2014 Cycle 6 RVD 01/09/2015 Maintenance Revlimid, 10 mg daily, 02/05/2015 , discontinue  02/26/2015 secondary to leg edema and diarrhea Revlimid resumed at a dose of 10 mg every other day beginning 03/13/2015 PET scan 19/41/7408-XK hypermetabolic bone lesions, few lucent lesions in the spine and pelvis Revlimid discontinued January 2019 Enrollment on a clinical trial at [facility] with pomalidomide/Decadron +/- ixazomib beginning 03/10/2017 Treatment discontinued February 2021 secondary to rising serum lambda light chains PET 04/05/2019-hypermetabolic right axillary/chest wall nodes,  right paravertebral mass, small right pleural effusion,  retrocrural node, right lateral abdominal wall mass, L5 lesion mild compression deformity.  Focus of hypermetabolism at the anterior left pelvic wall without a CT correlate.  Hypermetabolic lytic lesion in the left T3 transverse process Cycle 1 daratumumab/carfilzomib/Decadron 04/05/2019 Radiation to the right chest wall mass and lumbar spine beginning 04/25/2019 Cycle 2 daratumumab/carfilzomib/Decadron 05/04/2019 (carfilzomib dose escalated); day 8 held due to neutropenia, thrombocytopenia; day 15 05/18/2019 (carfilzomib dose reduced) Daratumumab/carfilzomib/Decadron changed to every 2-week dosing beginning 05/25/2019 Daratumumab changed to a monthly schedule beginning 09/21/2019; carfilzomib every 2 weeks Daratumumab held 11/15/2019 secondary to thrombocytopenia, carfilzomib continued at a reduced dose CT chest 11/19/2019-T10 paraspinous mass enlarged compared to 09/13/2019, other bone lesions unchanged Daratumumab/carfilzomib/Decadron 12/14/2019 Daratumumab/carfilzomib/Decadron 12/27/2019 Daratumumab/carfilzomib/Decadron 01/11/2020 PET 01/24/2020-general improvement in previously noted bone lesions with sclerosis, improvement in chest adenopathy, enlargement of right lower thoracic paraspinal/pleural mass and separate pleural tumor deposits on the right, increased right and left pleural effusions, new small to moderate pericardial effusion, prepatellar  bursitis/edema on the right, inflammatory activity at the left lateral calf Daratumumab/carfilzomib/Decadron 01/24/2020 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, lambda light chain restricted Palliative radiation to T10 and paraspinal mass,  10 fractions, 02/15/2020 through 02/28/2020 Bone marrow biopsy 04/10/2020-normocellular marrow with no increase in plasma cells, mild decrease in megakaryocytes on the bone marrow biopsy, no light chain restricted plasma cell population identified decreased iron stores, normal karyotype, myeloma FISH panel negative CT chest 07/04/2020-development of bulky right axillary adenopathy, progressive chest wall masses, destructive mass at the right side of T10, juxta cardiac node, right retrocrural node mild collapse/consolidative opacity in the dependent right lower lobe, right pleural effusion PET 9/47/6546-TKP hypermetabolic right axillary lymph nodes, an epicardial lymph node, and a retrocrural lymph node, marked soft tissue expansion involving right lower rib lesions, increased right pleural effusion, new bone lesions at the ischium and pubis, left T3 transverse process lesion 07/27/2020 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   History of intermittent prostatitis   4.   Skin rash 10/24/2014-referred to dermatology, biopsy consistent with local hypersensitivity reaction   5.   Urinary retention-most likely related to  radiation toxicity, followed by urology, status post a Urolift procedure 03/12/2015-improved   6.   Left lower leg cellulitis 06/24/2015-treated with Keflex   7.   History of mild thrombocytopenia secondary to myeloma and systemic therapy   8.   History of mild neutropenia- secondary to myeloma and systemic therapy   9.   Fall with a skull fracture and subarachnoid blood/right frontal lobe contusions 10/17/2015   10. Right lower lobe pulmonary embolus 03/28/2016. Lovenox , transitioned to Xarelto   11. Vesicular rash June 2018-potentially related to the zoster vaccine, resolved   12.  History of atrial fibrillation   13.  Aortic stenosis-TAVR 10/25/2019   14.  07/02/2019-hospital admission for sepsis secondary to cellulitis of the left lower extremity with gram-negative rod bacteremia (Aeromonas caviae)   15. Pleuritic chest pain - onset 10/2019, cardiac w/u negative   16. Right shoulder pain, possible bone spur on 04/2019 PET, likely MSK related (12/26/19)   17. Eczematous rash (12/26/19),  not felt to be drug rash/allergy. 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  He is enrolled in the hospice program.  Plan to continue supportive/comfort care.  Exertional dyspnea is unchanged.  We discussed a chest x-ray/palliative therapeutic thoracentesis.  He declines this at present.  He will contact the office if the dyspnea worsens.  He has an enlarged right axillary lymph node mass related to myeloma.  He is not bothered by the mass.  We can consider radiation if he develops pain.  He will return for follow-up around 12/13/2020.  We are available to see him sooner if needed.  Patient seen with [name].  Ned Card ANP/GNP-BC   11/14/2020  12:55 PM This was a shared visit with Ned Card.  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  His overall status appears stable.  He has diminished breath sounds in the right chest and  exertional dyspnea.  He does not wish to undergo a chest x-ray or thoracentesis at present.  He will continue Decadron if he feels this is helpful with his pain or  appetite.  I was present for greater than 50% of today's visit.  I performed medical decision making.  Julieanne Manson, MD

## 2020-11-15 ENCOUNTER — Encounter: Payer: Self-pay | Admitting: Oncology

## 2020-11-15 ENCOUNTER — Telehealth: Payer: Self-pay | Admitting: Oncology

## 2020-11-15 ENCOUNTER — Ambulatory Visit
Admission: RE | Admit: 2020-11-15 | Discharge: 2020-11-15 | Disposition: A | Discharge status: Home / Self Care | Payer: Medicare PPO | Source: Ambulatory Visit | Attending: Radiation Oncology | Admitting: Radiation Oncology

## 2020-11-15 DIAGNOSIS — C9002 Multiple myeloma in relapse: Secondary | ICD-10-CM | POA: Insufficient documentation

## 2020-11-15 NOTE — Telephone Encounter (Signed)
Appointments scheduled per 10/19 los patient notified

## 2020-11-15 NOTE — Progress Notes (Signed)
  Radiation Oncology         [phone] ________________________________  Name: Mitchell Lowery MRN: [id number]  Date: 10/08/2020  DOB: 07/24/1934  End of Treatment Note  Diagnosis:   86 y.o. man with a painful lesion at the right lateral chest wall/rib secondary to his multiple myeloma     Indication for treatment:  Palliation       Radiation treatment dates:   10/02/20 - 10/08/20  Site/dose:   The painful lesion in the right lateral chest wall/rib was treated to 20 Gy in 5 fractions.  Beams/energy:   A 3D field set-up was employed with 6 MV X-rays  Narrative: The patient tolerated radiation treatment relatively well.   He did not report any ill side effects.  Plan: The patient has completed radiation treatment. The patient will return to radiation oncology clinic for routine followup in one month. I advised him to call or return sooner if he has any questions or concerns related to his recovery or treatment. ________________________________  Sheral Apley. Tammi Klippel, M.D.

## 2020-11-15 NOTE — Progress Notes (Signed)
Radiation Oncology         [phone] ________________________________  Name: Mitchell Lowery MRN: [id number]  Date: 11/15/2020  DOB: April 18, 1934  Post Treatment Note  CC: [name], MD  Ladell Pier, MD  Diagnosis:   86 y.o. man with a painful lesion at the right lateral chest wall/rib secondary to his multiple myeloma  Interval Since Last Radiation:  5.5 weeks  10/02/20 - 10/08/20: The painful lesion in the right lateral chest wall/rib was treated to 20 Gy in 5 fractions.  02/15/20-02/28/20:   The spinal and paraspinal mass at right of T10 was treated to 20 Gy in 10 fractions of 2 Gy   04/25/19-05/09/19:   1.  The L5 lesion was treated to 20 Gy in 10 fractions of 2 Gy 2.  The right lateral rib lesion was treated to 20 Gy in 10 fractions of 2 Gy   07/21/2014:  Insertion of radioactive I-125 seeds into the prostate gland; 110 Gy, boost therapy.(Manning/Dahlststedt)   05/17/2014-06/21/2014:  The prostate, seminal vesicles and pelvic lymph nodes were treated to 45 Gy in 25 fractions of 1.8 Gy  Narrative:  I spoke with the patient to conduct his routine scheduled 1 month follow up visit via telephone to spare the patient unnecessary potential exposure in the healthcare setting during the current COVID-19 pandemic.  The patient was notified in advance and gave permission to proceed with this visit format.  He tolerated radiation treatment relatively well.   He did not report any ill side effects.                              On review of systems, the patient states that he is doing well in general.  His pain remains very well controlled at this point and he denies any new focal sites of pain.  He does have a dry, itchy patch of skin on his back, in the treatment field but denies any wound or bleeding.  He continues with fatigue/malaise and decreased appetite but is eating regularly to try and maintain his weight.  He also has some symptomatic exertional dyspnea with a known right pleural  effusion but is not yet at the point where he wants to consider therapeutic thoracentesis.  He has recently transitioned to hospice care and has a nurse that comes out to visit weekly.  ALLERGIES:  is allergic to bee venom.  Meds: Current Outpatient Medications  Medication Sig Dispense Refill   acetaminophen (TYLENOL) 500 MG tablet Take 1,000 mg by mouth every 6 (six) hours as needed for moderate pain.     loperamide (IMODIUM) 2 MG capsule Take 2 mg by mouth 4 (four) times daily as needed for diarrhea or loose stools.     metoprolol succinate (TOPROL XL) 25 MG 24 hr tablet Take 0.5 tablets (12.5 mg total) by mouth daily. 45 tablet 3   oxyCODONE (OXY IR/ROXICODONE) 5 MG immediate release tablet Take 0.5-1 tablets (2.5-5 mg total) by mouth every 4 (four) hours as needed for severe pain. 60 tablet 0   amoxicillin (AMOXIL) 500 MG tablet Take 4 tablets (2,000 mg) one hour prior to all dental visits. (Patient not taking: No sig reported) 8 tablet 12   Cholecalciferol 2000 units TABS Take 2,000 Units by mouth every evening.  (Patient not taking: Reported on 11/13/2020)     docusate sodium (COLACE) 100 MG capsule Take 100 mg by mouth daily as needed for mild  constipation. (Patient not taking: No sig reported)     furosemide (LASIX) 20 MG tablet Take 1 tablet (20 mg total) by mouth daily. (Patient not taking: No sig reported) 30 tablet 11   losartan (COZAAR) 50 MG tablet Take 50 mg by mouth every evening.  (Patient not taking: No sig reported)     melatonin 5 MG TABS Take 5 mg by mouth at bedtime as needed (sleep).     METAMUCIL FIBER PO Take 1 Scoop by mouth daily as needed (constipation). (Patient not taking: No sig reported)     polyethylene glycol (MIRALAX / GLYCOLAX) 17 g packet Take by mouth daily as needed for moderate constipation. 1 measured teaspoonful daily as needed for constipation (Patient not taking: No sig reported)     predniSONE (DELTASONE) 5 MG tablet Take 2 tablets (10 mg total) by  mouth daily with breakfast. (Patient not taking: No sig reported) 60 tablet 0   rivaroxaban (XARELTO) 20 MG TABS tablet TAKE 1 TABLET BY MOUTH DAILY WITH SUPPER. (Patient not taking: Reported on 11/13/2020) 30 tablet 11   valACYclovir (VALTREX) 500 MG tablet Take 500 mg by mouth every evening.  (Patient not taking: Reported on 11/13/2020)     vitamin B-12 (CYANOCOBALAMIN) 500 MCG tablet Take 500 mcg by mouth every evening.  (Patient not taking: Reported on 11/13/2020)     No current facility-administered medications for this encounter.    Physical Findings:  vitals were not taken for this visit.  Pain Assessment Pain Score: 4  (Bilateral hips)/10 Unable to assess due to telephone follow-up visit format.  Lab Findings: Lab Results  Component Value Date   WBC 3.9 (L) 07/27/2020   HGB 12.2 (L) 07/27/2020   HCT 38.5 (L) 07/27/2020   MCV 90.2 07/27/2020   PLT 65 (L) 07/27/2020     Radiographic Findings: No results found.  Impression/Plan: 11. 86 y.o. man with a painful lesion at the right lateral chest wall/rib secondary to his multiple myeloma. He appears to have recovered well from the effects of his recent radiotherapy and is currently without complaints.  We discussed that while we are happy to continue to participate in his care if clinically indicated, at this point, we will plan to see him back on an as-needed basis.  He will continue his routine follow-up under the care and direction of [name] and his Hospice care team for continued symptom management and knows that he is welcome to call at anytime with any questions or concerns related to his previous radiation.     Nicholos Johns, PA-C

## 2020-12-06 ENCOUNTER — Encounter: Payer: Self-pay | Admitting: Oncology

## 2020-12-13 ENCOUNTER — Inpatient Hospital Stay: Attending: Oncology | Admitting: Oncology

## 2020-12-13 ENCOUNTER — Other Ambulatory Visit: Payer: Self-pay

## 2020-12-13 VITALS — BP 107/63 | HR 77 | Temp 98.1°F | Resp 18 | Ht 71.0 in | Wt 134.0 lb

## 2020-12-13 DIAGNOSIS — C9000 Multiple myeloma not having achieved remission: Secondary | ICD-10-CM | POA: Diagnosis not present

## 2020-12-13 NOTE — Progress Notes (Signed)
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  He continues follow-up with the home hospice team.  He does not have significant pain at present.  He has anorexia.  Dyspnea has not changed.  He is ambulatory in the home.  Objective:  Vital signs in last 24 hours:  Blood pressure 107/63, pulse 77, temperature 98.1 F (36.7 C), temperature source Oral, resp. rate 18, height _0  (1.803 m), weight 134 lb (60.8 kg), SpO2 100 %.    Lymphatics: Mobile 10 cm node in the right axilla Resp: Lungs with decreased breath sounds throughout the right chest, some air movement at the right upper chest, no respiratory distress Cardio: Regular rhythm, 2/6 diastolic murmur GI: Soft and nontender Vascular: No leg edema   Lab Results:  Lab Results  Component Value Date   WBC 3.9 (L) 07/27/2020   HGB 12.2 (L) 07/27/2020   HCT 38.5 (L) 07/27/2020   MCV 90.2 07/27/2020   PLT 65 (L) 07/27/2020   NEUTROABS 3.0 06/27/2020    CMP  Lab Results  Component Value Date   NA 140 06/27/2020   K 4.1 06/27/2020   CL 103 06/27/2020   CO2 29 06/27/2020   GLUCOSE 69 (L) 06/27/2020   BUN 15 06/27/2020   CREATININE 0.74 06/27/2020   CALCIUM 8.8 (L) 06/27/2020   PROT 5.5 (L) 06/27/2020   ALBUMIN 3.7 06/27/2020   AST 16 06/27/2020   ALT 12 06/27/2020   ALKPHOS 39 06/27/2020   BILITOT 0.5 06/27/2020   GFRNONAA >60 06/27/2020   GFRAA >60 10/26/2019     Medications: I have reviewed the patient's current medications.   Assessment/Plan: Multiple myeloma-confirmed on a bone marrow biopsy 08/07/2014, IgA lambda Serum M spike and increased serum free lambda light chains Myeloma FISH panel negative for chromosome 4, 11, 12, 13, 14, and 17 abnormalities, cytogenetics with no metaphases Bone survey 08/15/2014 with indeterminant lucent skull lesions Cycle 1 RVD 08/22/2014 Cycle  2 RVD 09/19/2014 Cycle 3 RVD 10/17/2014 Cycle 4 RVD 11/14/2014 (Decadron reduced to 20 mg weekly, Revlimid 15 mg days 1 through 14, Velcade 3/4 weeks) Cycle 5 RVD 12/12/2014 Serum M spike not detected 12/26/2014 Cycle 6 RVD 01/09/2015 Maintenance Revlimid, 10 mg daily, 02/05/2015 , discontinue 02/26/2015 secondary to leg edema and diarrhea Revlimid resumed at a dose of 10 mg every other day beginning 03/13/2015 PET scan 46/96/2952-WU hypermetabolic bone lesions, few lucent lesions in the spine and pelvis Revlimid discontinued January 2019 Enrollment on a clinical trial at [facility] with pomalidomide/Decadron +/- ixazomib beginning 03/10/2017 Treatment discontinued February 2021 secondary to rising serum lambda light chains PET 04/05/2019-hypermetabolic right axillary/chest wall nodes,  right paravertebral mass, small right pleural effusion,  retrocrural node, right lateral abdominal wall mass, L5 lesion mild compression deformity.  Focus of hypermetabolism at the anterior left pelvic wall without a CT correlate.  Hypermetabolic lytic lesion in the left T3 transverse process Cycle 1 daratumumab/carfilzomib/Decadron 04/05/2019 Radiation to the right chest wall mass and lumbar spine beginning 04/25/2019 Cycle 2 daratumumab/carfilzomib/Decadron 05/04/2019 (carfilzomib dose escalated); day 8 held due to neutropenia, thrombocytopenia; day 15 05/18/2019 (carfilzomib dose reduced) Daratumumab/carfilzomib/Decadron changed to every 2-week dosing beginning 05/25/2019 Daratumumab changed to a monthly schedule beginning 09/21/2019; carfilzomib every 2 weeks Daratumumab held 11/15/2019 secondary to thrombocytopenia, carfilzomib continued at a reduced dose CT chest 11/19/2019-T10 paraspinous mass enlarged compared to 09/13/2019, other bone lesions unchanged  Daratumumab/carfilzomib/Decadron 12/14/2019 Daratumumab/carfilzomib/Decadron 12/27/2019 Daratumumab/carfilzomib/Decadron 01/11/2020 PET 01/24/2020-general improvement in  previously noted bone lesions with sclerosis, improvement in chest adenopathy, enlargement of right lower thoracic paraspinal/pleural mass and separate pleural tumor deposits on the right, increased right and left pleural effusions, new small to moderate pericardial effusion, prepatellar bursitis/edema on the right, inflammatory activity at the left lateral calf Daratumumab/carfilzomib/Decadron 01/24/2020 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, lambda light chain restricted Palliative radiation to T10 and paraspinal mass, 10 fractions, 02/15/2020 through 02/28/2020 Bone marrow biopsy 04/10/2020-normocellular marrow with no increase in plasma cells, mild decrease in megakaryocytes on the bone marrow biopsy, no light chain restricted plasma cell population identified decreased iron stores, normal karyotype, myeloma FISH panel negative CT chest 07/04/2020-development of bulky right axillary adenopathy, progressive chest wall masses, destructive mass at the right side of T10, juxta cardiac node, right retrocrural node mild collapse/consolidative opacity in the dependent right lower lobe, right pleural effusion PET 02/06/5518-EYE hypermetabolic right axillary lymph nodes, an epicardial lymph node, and a retrocrural lymph node, marked soft tissue expansion involving right lower rib lesions, increased right pleural effusion, new bone lesions at the ischium and pubis, left T3 transverse process lesion 07/27/2020 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   History of intermittent prostatitis   4.   Skin rash 10/24/2014-referred to dermatology, biopsy consistent with local hypersensitivity reaction   5.   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   Left lower leg cellulitis 06/24/2015-treated with Keflex   7.   History of mild thrombocytopenia secondary to myeloma and systemic therapy   8.   History of mild neutropenia- secondary to myeloma and systemic therapy   9.   Fall with a skull fracture and subarachnoid blood/right frontal lobe contusions 10/17/2015   10. Right lower lobe pulmonary embolus 03/28/2016. Lovenox , transitioned to Xarelto   11. Vesicular rash June 2018-potentially related to the zoster vaccine, resolved   12.  History of atrial fibrillation   13.  Aortic stenosis-TAVR 10/25/2019   14.  07/02/2019-hospital admission for sepsis secondary to cellulitis of the left lower extremity with gram-negative rod bacteremia (Aeromonas caviae)   15. Pleuritic chest pain - onset 10/2019, cardiac w/u negative   16. Right shoulder pain, possible bone spur on 04/2019 PET, likely MSK related (12/26/19)   17. Eczematous rash (12/26/19),  not felt to be drug rash/allergy. 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  His performance status continues to slowly decline.  He appears comfortable.  He does not wish to have a chest x-ray to evaluate the right pleural effusion.  He will continue follow-up with a home hospice RN.  He will return for an office visit in 1 month.  I am available to see him sooner as needed.  [name], MD  12/13/2020  12:26 PM

## 2020-12-14 ENCOUNTER — Telehealth: Payer: Self-pay | Admitting: Oncology

## 2020-12-14 NOTE — Telephone Encounter (Signed)
Called patient per 11/17 los - left message for patient to call back to schedule next  follow up appt

## 2020-12-31 ENCOUNTER — Telehealth: Payer: Self-pay

## 2021-01-10 ENCOUNTER — Ambulatory Visit: Payer: Medicare PPO | Admitting: Oncology

## 2021-01-27 NOTE — Telephone Encounter (Signed)
Correspondence from [facility] stating Pt passed away today at 1223 am

## 2021-01-27 DEATH — deceased

## 2021-02-18 ENCOUNTER — Encounter: Payer: Self-pay | Admitting: Oncology

## 2022-06-04 IMAGING — CT CT ANGIO CHEST
1 of 3 series · 1 of 21 positions shown · IV contrast (omnipaque)
Comparison: Chest CT 07/23/2016.

CLINICAL DATA: 85-year-old male with history of severe aortic
stenosis. Preprocedural study prior to potential transcatheter
aortic valve replacement (TAVR) procedure. Additional history of
multiple myeloma.

EXAM:
CT ANGIOGRAPHY CHEST, ABDOMEN AND PELVIS
TECHNIQUE: Multidetector CT imaging through the chest, abdomen and pelvis was
performed using the standard protocol during bolus administration of
intravenous contrast. Multiplanar reconstructed images and MIPs were
obtained and reviewed to evaluate the vascular anatomy.
CONTRAST:  100mL OMNIPAQUE IOHEXOL 350 MG/ML SOLN

[Series 504: — · 0.24mm/px · 1 of 10 slices shown]
[im 6/10]
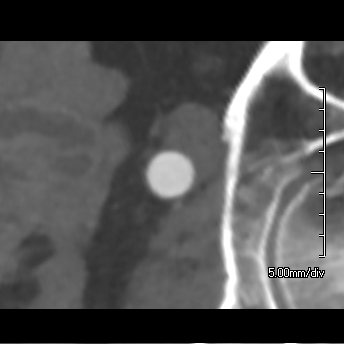

[1 of 21 positions shown; findings below may reference images not displayed]

FINDINGS: CTA CHEST FINDINGS

Cardiovascular: Heart size is normal. There is no significant
pericardial fluid, thickening or pericardial calcification. There is
aortic atherosclerosis, as well as atherosclerosis of the great
vessels of the mediastinum and the coronary arteries, including
calcified atherosclerotic plaque in the left anterior descending and
right coronary arteries. Severe thickening calcification of the
aortic valve. Calcifications of the mitral annulus.

Mediastinum/Lymph Nodes: No pathologically enlarged mediastinal or
hilar lymph nodes. Esophagus is unremarkable in appearance. No
axillary lymphadenopathy.

Lungs/Pleura: No suspicious appearing pulmonary nodules or masses
are noted. No acute consolidative airspace disease. Trace right
pleural effusion lying dependently. Small amount of pleural
enhancement in the base of the right hemithorax best appreciated on
axial image 84 of series 15.

Musculoskeletal/Soft Tissues: Pole multiple osseous lesions, ranging
from lytic to sclerotic. The largest of these is in the right-side
of T10 vertebral body (axial image 81 of series 16) measuring 2.9 x
1.7 cm, the with a mixed lytic and sclerotic appearance immediately
adjacent to a soft tissue mass in the paravertebral region of the
medial right hemithorax which appears to be subpleural which
measures 1.6 x 1.8 cm (axial image 82 of series 15).

CTA ABDOMEN AND PELVIS FINDINGS

Hepatobiliary: No suspicious cystic or solid hepatic lesions. No
intra or extrahepatic biliary ductal dilatation. Gallbladder is
normal in appearance.

Pancreas: No pancreatic mass. No pancreatic ductal dilatation. No
pancreatic or peripancreatic fluid collections or inflammatory
changes.

Spleen: Unremarkable.

Adrenals/Urinary Tract: In the lower pole the left kidney there is a
2.6 x 2.1 cm low-attenuation lesion compatible with a simple cyst.
Right kidney and bilateral adrenal glands are normal in appearance.
No hydroureteronephrosis. Urinary bladder appears partially
decompressed, the demonstrates thickened and trabeculated wall with
multiple small diverticuli, largest of which is posteriorly
measuring 1.6 cm.

Stomach/Bowel: Normal appearance of the stomach. No pathologic
dilatation of small bowel or colon. Normal appendix.

Vascular/Lymphatic: Aortic atherosclerosis, without evidence of
aneurysm or dissection in the abdominal or pelvic vasculature.
Vascular findings and measurements pertinent to potential TAVR
procedure, as detailed below. No lymphadenopathy noted in the
abdomen or pelvis.

Reproductive: Brachytherapy implants throughout the prostate gland.

Other: No significant volume of ascites.  No pneumoperitoneum.

Musculoskeletal: Multiple osseous lesions which range from lytic to
sclerotic, most notable involving the L5 vertebral body where there
are pathologic compression fractures of both the superior and
inferior endplates with 90% loss of central vertebral body height.

VASCULAR MEASUREMENTS PERTINENT TO TAVR:

AORTA:

Minimal Aortic Diameter-E1 x 16 mm

Severity of Aortic Calcification-mild-to-moderate

RIGHT PELVIS:

Right Common Iliac Artery -

Minimal Liameter-L.L x 9.4 mm

Tortuosity-mild

Calcification-moderate

Right External Iliac Artery -

Minimal 2iameter-HK.A x 10.7 mm

Tortuosity-severe

Calcification-mild

Right Common Femoral Artery -

Minimal 4iameter-L.K x 9.5 mm

Tortuosity-mild

Calcification-mild-to-moderate

LEFT PELVIS:

Left Common Iliac Artery -

Minimal Eiameter-OS.O x 10.0 mm

Tortuosity-mild

Calcification-moderate

Left External Iliac Artery -

Minimal Biameter-T.5 x 7.6 mm

Tortuosity-moderate

Calcification-mild

Left Common Femoral Artery -

Minimal 5iameter-HS.R x 8.9 mm

Tortuosity-mild

Calcification-mild

Review of the MIP images confirms the above findings.
IMPRESSION: 1. Vascular findings and measurements pertinent to potential TAVR
procedure, as detailed above.
2. Severe thickening and calcification of the aortic valve,
compatible with reported clinical history of severe aortic stenosis.
3. Widespread osseous lesions compatible with reported clinical
history of multiple myeloma. Additional enhancing soft tissue
paravertebral lesion in the lower right hemithorax and small amount
of pleural enhancement in the lower right hemithorax also noted.
4. Aortic atherosclerosis, in addition to two vessel coronary artery
disease.
5. Additional incidental findings, as above.

## 2022-11-11 NOTE — Telephone Encounter (Signed)
TC
